# Patient Record
Sex: Male | Born: 1960
Health system: Southern US, Community
[De-identification: ages and names within clinical notes are randomized; demographics above are authoritative.]

## PROBLEM LIST (undated history)

## (undated) DIAGNOSIS — I4901 Ventricular fibrillation: Secondary | ICD-10-CM

## (undated) DIAGNOSIS — M1990 Unspecified osteoarthritis, unspecified site: Secondary | ICD-10-CM

## (undated) DIAGNOSIS — I5022 Chronic systolic (congestive) heart failure: Secondary | ICD-10-CM

## (undated) DIAGNOSIS — N189 Chronic kidney disease, unspecified: Secondary | ICD-10-CM

## (undated) DIAGNOSIS — I10 Essential (primary) hypertension: Secondary | ICD-10-CM

## (undated) DIAGNOSIS — Z95810 Presence of automatic (implantable) cardiac defibrillator: Secondary | ICD-10-CM

## (undated) DIAGNOSIS — I428 Other cardiomyopathies: Secondary | ICD-10-CM

## (undated) HISTORY — DX: Presence of automatic (implantable) cardiac defibrillator: Z95.810

## (undated) HISTORY — DX: Other cardiomyopathies: I42.8

## (undated) HISTORY — PX: APPENDECTOMY: SHX54

## (undated) HISTORY — DX: Ventricular fibrillation: I49.01

## (undated) HISTORY — DX: Chronic systolic (congestive) heart failure: I50.22

---

## 2014-11-10 ENCOUNTER — Emergency Department (HOSPITAL_COMMUNITY): Payer: BLUE CROSS/BLUE SHIELD

## 2014-11-10 ENCOUNTER — Encounter (HOSPITAL_COMMUNITY): Payer: Self-pay | Admitting: Radiology

## 2014-11-10 ENCOUNTER — Inpatient Hospital Stay (HOSPITAL_COMMUNITY): Payer: BLUE CROSS/BLUE SHIELD

## 2014-11-10 ENCOUNTER — Inpatient Hospital Stay (HOSPITAL_COMMUNITY)
Admission: EM | Admit: 2014-11-10 | Discharge: 2014-11-17 | DRG: 224 | Disposition: A | Discharge status: Home Health | Payer: BLUE CROSS/BLUE SHIELD | Attending: Internal Medicine | Admitting: Internal Medicine

## 2014-11-10 ENCOUNTER — Encounter (HOSPITAL_COMMUNITY)
Admission: EM | Disposition: A | Discharge status: Home Health | Payer: BLUE CROSS/BLUE SHIELD | Source: Home / Self Care | Attending: Emergency Medicine

## 2014-11-10 ENCOUNTER — Other Ambulatory Visit (HOSPITAL_BASED_OUTPATIENT_CLINIC_OR_DEPARTMENT_OTHER): Payer: BLUE CROSS/BLUE SHIELD

## 2014-11-10 DIAGNOSIS — G931 Anoxic brain damage, not elsewhere classified: Secondary | ICD-10-CM | POA: Diagnosis present

## 2014-11-10 DIAGNOSIS — R0902 Hypoxemia: Secondary | ICD-10-CM | POA: Insufficient documentation

## 2014-11-10 DIAGNOSIS — R739 Hyperglycemia, unspecified: Secondary | ICD-10-CM | POA: Diagnosis present

## 2014-11-10 DIAGNOSIS — E785 Hyperlipidemia, unspecified: Secondary | ICD-10-CM | POA: Diagnosis present

## 2014-11-10 DIAGNOSIS — I5023 Acute on chronic systolic (congestive) heart failure: Secondary | ICD-10-CM | POA: Diagnosis present

## 2014-11-10 DIAGNOSIS — I9589 Other hypotension: Secondary | ICD-10-CM

## 2014-11-10 DIAGNOSIS — I6789 Other cerebrovascular disease: Secondary | ICD-10-CM

## 2014-11-10 DIAGNOSIS — I429 Cardiomyopathy, unspecified: Secondary | ICD-10-CM | POA: Diagnosis present

## 2014-11-10 DIAGNOSIS — E669 Obesity, unspecified: Secondary | ICD-10-CM | POA: Diagnosis present

## 2014-11-10 DIAGNOSIS — R740 Nonspecific elevation of levels of transaminase and lactic acid dehydrogenase [LDH]: Secondary | ICD-10-CM | POA: Diagnosis not present

## 2014-11-10 DIAGNOSIS — D649 Anemia, unspecified: Secondary | ICD-10-CM | POA: Diagnosis present

## 2014-11-10 DIAGNOSIS — M1990 Unspecified osteoarthritis, unspecified site: Secondary | ICD-10-CM | POA: Diagnosis present

## 2014-11-10 DIAGNOSIS — J690 Pneumonitis due to inhalation of food and vomit: Secondary | ICD-10-CM | POA: Diagnosis not present

## 2014-11-10 DIAGNOSIS — T502X5A Adverse effect of carbonic-anhydrase inhibitors, benzothiadiazides and other diuretics, initial encounter: Secondary | ICD-10-CM | POA: Diagnosis present

## 2014-11-10 DIAGNOSIS — J9811 Atelectasis: Secondary | ICD-10-CM | POA: Diagnosis present

## 2014-11-10 DIAGNOSIS — I10 Essential (primary) hypertension: Secondary | ICD-10-CM | POA: Diagnosis present

## 2014-11-10 DIAGNOSIS — I472 Ventricular tachycardia, unspecified: Secondary | ICD-10-CM

## 2014-11-10 DIAGNOSIS — T82594A Other mechanical complication of infusion catheter, initial encounter: Secondary | ICD-10-CM

## 2014-11-10 DIAGNOSIS — I428 Other cardiomyopathies: Secondary | ICD-10-CM

## 2014-11-10 DIAGNOSIS — N179 Acute kidney failure, unspecified: Secondary | ICD-10-CM | POA: Diagnosis present

## 2014-11-10 DIAGNOSIS — Z9289 Personal history of other medical treatment: Secondary | ICD-10-CM

## 2014-11-10 DIAGNOSIS — T82898A Other specified complication of vascular prosthetic devices, implants and grafts, initial encounter: Secondary | ICD-10-CM | POA: Diagnosis present

## 2014-11-10 DIAGNOSIS — Z959 Presence of cardiac and vascular implant and graft, unspecified: Secondary | ICD-10-CM

## 2014-11-10 DIAGNOSIS — Z6828 Body mass index (BMI) 28.0-28.9, adult: Secondary | ICD-10-CM | POA: Diagnosis not present

## 2014-11-10 DIAGNOSIS — E876 Hypokalemia: Secondary | ICD-10-CM | POA: Diagnosis present

## 2014-11-10 DIAGNOSIS — I129 Hypertensive chronic kidney disease with stage 1 through stage 4 chronic kidney disease, or unspecified chronic kidney disease: Secondary | ICD-10-CM | POA: Diagnosis present

## 2014-11-10 DIAGNOSIS — J9600 Acute respiratory failure, unspecified whether with hypoxia or hypercapnia: Secondary | ICD-10-CM | POA: Diagnosis not present

## 2014-11-10 DIAGNOSIS — I469 Cardiac arrest, cause unspecified: Secondary | ICD-10-CM | POA: Diagnosis present

## 2014-11-10 DIAGNOSIS — I447 Left bundle-branch block, unspecified: Secondary | ICD-10-CM | POA: Diagnosis present

## 2014-11-10 DIAGNOSIS — J9601 Acute respiratory failure with hypoxia: Secondary | ICD-10-CM | POA: Diagnosis not present

## 2014-11-10 DIAGNOSIS — I4901 Ventricular fibrillation: Secondary | ICD-10-CM | POA: Diagnosis not present

## 2014-11-10 DIAGNOSIS — E162 Hypoglycemia, unspecified: Secondary | ICD-10-CM | POA: Diagnosis not present

## 2014-11-10 DIAGNOSIS — Y848 Other medical procedures as the cause of abnormal reaction of the patient, or of later complication, without mention of misadventure at the time of the procedure: Secondary | ICD-10-CM | POA: Diagnosis present

## 2014-11-10 DIAGNOSIS — R570 Cardiogenic shock: Secondary | ICD-10-CM | POA: Diagnosis present

## 2014-11-10 DIAGNOSIS — J189 Pneumonia, unspecified organism: Secondary | ICD-10-CM

## 2014-11-10 HISTORY — PX: CARDIAC CATHETERIZATION: SHX172

## 2014-11-10 HISTORY — DX: Essential (primary) hypertension: I10

## 2014-11-10 HISTORY — DX: Chronic kidney disease, unspecified: N18.9

## 2014-11-10 HISTORY — DX: Unspecified osteoarthritis, unspecified site: M19.90

## 2014-11-10 LAB — COMPREHENSIVE METABOLIC PANEL
ALT: 314 U/L — ABNORMAL HIGH (ref 17–63)
AST: 242 U/L — ABNORMAL HIGH (ref 15–41)
Albumin: 3.1 g/dL — ABNORMAL LOW (ref 3.5–5.0)
Alkaline Phosphatase: 50 U/L (ref 38–126)
Anion gap: 17 — ABNORMAL HIGH (ref 5–15)
BUN: 17 mg/dL (ref 6–20)
CO2: 16 mmol/L — ABNORMAL LOW (ref 22–32)
Calcium: 7.7 mg/dL — ABNORMAL LOW (ref 8.9–10.3)
Chloride: 106 mmol/L (ref 101–111)
Creatinine, Ser: 2.13 mg/dL — ABNORMAL HIGH (ref 0.61–1.24)
GFR calc Af Amer: 39 mL/min — ABNORMAL LOW (ref >60)
GFR calc non Af Amer: 33 mL/min — ABNORMAL LOW (ref >60)
Glucose, Bld: 322 mg/dL — ABNORMAL HIGH (ref 65–99)
Potassium: 2.6 mmol/L — CL (ref 3.5–5.1)
Sodium: 139 mmol/L (ref 135–145)
Total Bilirubin: 1 mg/dL (ref 0.3–1.2)
Total Protein: 5.5 g/dL — ABNORMAL LOW (ref 6.5–8.1)

## 2014-11-10 LAB — POCT I-STAT 3, ART BLOOD GAS (G3+)
Acid-base deficit: 11 mmol/L — ABNORMAL HIGH (ref 0.0–2.0)
Acid-base deficit: 11 mmol/L — ABNORMAL HIGH (ref 0.0–2.0)
Acid-base deficit: 10 mmol/L — ABNORMAL HIGH (ref 0.0–2.0)
Acid-base deficit: 8 mmol/L — ABNORMAL HIGH (ref 0.0–2.0)
Bicarbonate: 19 mEq/L — ABNORMAL LOW (ref 20.0–24.0)
Bicarbonate: 16 mEq/L — ABNORMAL LOW (ref 20.0–24.0)
Bicarbonate: 14.4 mEq/L — ABNORMAL LOW (ref 20.0–24.0)
Bicarbonate: 14.3 mEq/L — ABNORMAL LOW (ref 20.0–24.0)
O2 Saturation: 83 %
O2 Saturation: 79 %
O2 Saturation: 95 %
O2 Saturation: 99 %
Patient temperature: 34.2
Patient temperature: 33
Patient temperature: 33
Patient temperature: 33
TCO2: 21 mmol/L (ref 0–100)
TCO2: 17 mmol/L (ref 0–100)
TCO2: 15 mmol/L (ref 0–100)
TCO2: 15 mmol/L (ref 0–100)
pCO2 arterial: 51.1 mmHg — ABNORMAL HIGH (ref 35.0–45.0)
pCO2 arterial: 32.2 mmHg — ABNORMAL LOW (ref 35.0–45.0)
pCO2 arterial: 22.6 mmHg — ABNORMAL LOW (ref 35.0–45.0)
pCO2 arterial: 19.7 mmHg — CL (ref 35.0–45.0)
pH, Arterial: 7.161 — CL (ref 7.350–7.450)
pH, Arterial: 7.283 — ABNORMAL LOW (ref 7.350–7.450)
pH, Arterial: 7.395 (ref 7.350–7.450)
pH, Arterial: 7.452 — ABNORMAL HIGH (ref 7.350–7.450)
pO2, Arterial: 52 mmHg — ABNORMAL LOW (ref 80.0–100.0)
pO2, Arterial: 39 mmHg — CL (ref 80.0–100.0)
pO2, Arterial: 63 mmHg — ABNORMAL LOW (ref 80.0–100.0)
pO2, Arterial: 116 mmHg — ABNORMAL HIGH (ref 80.0–100.0)

## 2014-11-10 LAB — GLUCOSE, CAPILLARY
Glucose-Capillary: 128 mg/dL — ABNORMAL HIGH (ref 65–99)
Glucose-Capillary: 118 mg/dL — ABNORMAL HIGH (ref 65–99)
Glucose-Capillary: 138 mg/dL — ABNORMAL HIGH (ref 65–99)
Glucose-Capillary: 139 mg/dL — ABNORMAL HIGH (ref 65–99)
Glucose-Capillary: 134 mg/dL — ABNORMAL HIGH (ref 65–99)
Glucose-Capillary: 104 mg/dL — ABNORMAL HIGH (ref 65–99)
Glucose-Capillary: 142 mg/dL — ABNORMAL HIGH (ref 65–99)
Glucose-Capillary: 101 mg/dL — ABNORMAL HIGH (ref 65–99)
Glucose-Capillary: 108 mg/dL — ABNORMAL HIGH (ref 65–99)
Glucose-Capillary: 104 mg/dL — ABNORMAL HIGH (ref 65–99)

## 2014-11-10 LAB — BASIC METABOLIC PANEL
Anion gap: 4 — ABNORMAL LOW (ref 5–15)
Anion gap: 7 (ref 5–15)
Anion gap: 7 (ref 5–15)
Anion gap: 8 (ref 5–15)
BUN: 16 mg/dL (ref 6–20)
BUN: 11 mg/dL (ref 6–20)
BUN: 10 mg/dL (ref 6–20)
BUN: 8 mg/dL (ref 6–20)
CO2: 21 mmol/L — ABNORMAL LOW (ref 22–32)
CO2: 15 mmol/L — ABNORMAL LOW (ref 22–32)
CO2: 13 mmol/L — ABNORMAL LOW (ref 22–32)
CO2: 12 mmol/L — ABNORMAL LOW (ref 22–32)
Calcium: 6.6 mg/dL — ABNORMAL LOW (ref 8.9–10.3)
Calcium: 6.5 mg/dL — ABNORMAL LOW (ref 8.9–10.3)
Calcium: 5.8 mg/dL — CL (ref 8.9–10.3)
Calcium: 5.5 mg/dL — CL (ref 8.9–10.3)
Chloride: 114 mmol/L — ABNORMAL HIGH (ref 101–111)
Chloride: 117 mmol/L — ABNORMAL HIGH (ref 101–111)
Chloride: 121 mmol/L — ABNORMAL HIGH (ref 101–111)
Chloride: 118 mmol/L — ABNORMAL HIGH (ref 101–111)
Creatinine, Ser: 1.51 mg/dL — ABNORMAL HIGH (ref 0.61–1.24)
Creatinine, Ser: 0.95 mg/dL (ref 0.61–1.24)
Creatinine, Ser: 0.77 mg/dL (ref 0.61–1.24)
Creatinine, Ser: 0.69 mg/dL (ref 0.61–1.24)
GFR calc Af Amer: 59 mL/min — ABNORMAL LOW (ref >60)
GFR calc Af Amer: >60 mL/min (ref >60)
GFR calc Af Amer: >60 mL/min (ref >60)
GFR calc Af Amer: >60 mL/min (ref >60)
GFR calc non Af Amer: 51 mL/min — ABNORMAL LOW (ref >60)
GFR calc non Af Amer: >60 mL/min (ref >60)
GFR calc non Af Amer: >60 mL/min (ref >60)
GFR calc non Af Amer: >60 mL/min (ref >60)
Glucose, Bld: 149 mg/dL — ABNORMAL HIGH (ref 65–99)
Glucose, Bld: 122 mg/dL — ABNORMAL HIGH (ref 65–99)
Glucose, Bld: 111 mg/dL — ABNORMAL HIGH (ref 65–99)
Glucose, Bld: 102 mg/dL — ABNORMAL HIGH (ref 65–99)
Potassium: 3.8 mmol/L (ref 3.5–5.1)
Potassium: 2.7 mmol/L — CL (ref 3.5–5.1)
Potassium: 2.2 mmol/L — CL (ref 3.5–5.1)
Potassium: 2.2 mmol/L — CL (ref 3.5–5.1)
Sodium: 139 mmol/L (ref 135–145)
Sodium: 139 mmol/L (ref 135–145)
Sodium: 141 mmol/L (ref 135–145)
Sodium: 138 mmol/L (ref 135–145)

## 2014-11-10 LAB — URINALYSIS, ROUTINE W REFLEX MICROSCOPIC
Bilirubin Urine: NEGATIVE
Glucose, UA: NEGATIVE mg/dL
Ketones, ur: NEGATIVE mg/dL
Leukocytes, UA: NEGATIVE
Nitrite: NEGATIVE
Protein, ur: NEGATIVE mg/dL
Specific Gravity, Urine: 1.01 (ref 1.005–1.030)
Urobilinogen, UA: 0.2 mg/dL (ref 0.0–1.0)
pH: 5 (ref 5.0–8.0)

## 2014-11-10 LAB — POCT I-STAT, CHEM 8
BUN: 17 mg/dL (ref 6–20)
BUN: 17 mg/dL (ref 6–20)
BUN: 16 mg/dL (ref 6–20)
BUN: 10 mg/dL (ref 6–20)
Calcium, Ion: 0.99 mmol/L — ABNORMAL LOW (ref 1.12–1.23)
Calcium, Ion: 1.05 mmol/L — ABNORMAL LOW (ref 1.12–1.23)
Calcium, Ion: 1.08 mmol/L — ABNORMAL LOW (ref 1.12–1.23)
Calcium, Ion: 0.86 mmol/L — ABNORMAL LOW (ref 1.12–1.23)
Chloride: 117 mmol/L — ABNORMAL HIGH (ref 101–111)
Chloride: 109 mmol/L (ref 101–111)
Chloride: 109 mmol/L (ref 101–111)
Chloride: 114 mmol/L — ABNORMAL HIGH (ref 101–111)
Creatinine, Ser: 1.4 mg/dL — ABNORMAL HIGH (ref 0.61–1.24)
Creatinine, Ser: 1.1 mg/dL (ref 0.61–1.24)
Creatinine, Ser: 1 mg/dL (ref 0.61–1.24)
Creatinine, Ser: 0.6 mg/dL — ABNORMAL LOW (ref 0.61–1.24)
Glucose, Bld: 145 mg/dL — ABNORMAL HIGH (ref 65–99)
Glucose, Bld: 140 mg/dL — ABNORMAL HIGH (ref 65–99)
Glucose, Bld: 138 mg/dL — ABNORMAL HIGH (ref 65–99)
Glucose, Bld: 110 mg/dL — ABNORMAL HIGH (ref 65–99)
HCT: 38 % — ABNORMAL LOW (ref 39.0–52.0)
HCT: 45 % (ref 39.0–52.0)
HCT: 46 % (ref 39.0–52.0)
HCT: 38 % — ABNORMAL LOW (ref 39.0–52.0)
Hemoglobin: 12.9 g/dL — ABNORMAL LOW (ref 13.0–17.0)
Hemoglobin: 15.3 g/dL (ref 13.0–17.0)
Hemoglobin: 15.6 g/dL (ref 13.0–17.0)
Hemoglobin: 12.9 g/dL — ABNORMAL LOW (ref 13.0–17.0)
Potassium: 3.6 mmol/L (ref 3.5–5.1)
Potassium: 3.6 mmol/L (ref 3.5–5.1)
Potassium: 3.2 mmol/L — ABNORMAL LOW (ref 3.5–5.1)
Potassium: 2.2 mmol/L — CL (ref 3.5–5.1)
Sodium: 138 mmol/L (ref 135–145)
Sodium: 142 mmol/L (ref 135–145)
Sodium: 142 mmol/L (ref 135–145)
Sodium: 146 mmol/L — ABNORMAL HIGH (ref 135–145)
TCO2: 21 mmol/L (ref 0–100)
TCO2: 16 mmol/L (ref 0–100)
TCO2: 15 mmol/L (ref 0–100)
TCO2: 12 mmol/L (ref 0–100)

## 2014-11-10 LAB — URINE MICROSCOPIC-ADD ON

## 2014-11-10 LAB — I-STAT CHEM 8, ED
BUN: 21 mg/dL — ABNORMAL HIGH (ref 6–20)
Calcium, Ion: 0.96 mmol/L — ABNORMAL LOW (ref 1.12–1.23)
Chloride: 103 mmol/L (ref 101–111)
Creatinine, Ser: 2 mg/dL — ABNORMAL HIGH (ref 0.61–1.24)
Glucose, Bld: 313 mg/dL — ABNORMAL HIGH (ref 65–99)
HCT: 39 % (ref 39.0–52.0)
Hemoglobin: 13.3 g/dL (ref 13.0–17.0)
Potassium: 2.5 mmol/L — CL (ref 3.5–5.1)
Sodium: 140 mmol/L (ref 135–145)
TCO2: 17 mmol/L (ref 0–100)

## 2014-11-10 LAB — CBC WITH DIFFERENTIAL/PLATELET
Basophils Absolute: 0.1 10*3/uL (ref 0.0–0.1)
Basophils Relative: 1 % (ref 0–1)
Eosinophils Absolute: 0.2 10*3/uL (ref 0.0–0.7)
Eosinophils Relative: 2 % (ref 0–5)
HCT: 37.5 % — ABNORMAL LOW (ref 39.0–52.0)
Hemoglobin: 11.9 g/dL — ABNORMAL LOW (ref 13.0–17.0)
Lymphocytes Relative: 41 % (ref 12–46)
Lymphs Abs: 4.4 10*3/uL — ABNORMAL HIGH (ref 0.7–4.0)
MCH: 29 pg (ref 26.0–34.0)
MCHC: 31.7 g/dL (ref 30.0–36.0)
MCV: 91.2 fL (ref 78.0–100.0)
Monocytes Absolute: 0.4 10*3/uL (ref 0.1–1.0)
Monocytes Relative: 4 % (ref 3–12)
Neutro Abs: 5.7 10*3/uL (ref 1.7–7.7)
Neutrophils Relative %: 52 % (ref 43–77)
Platelets: 218 10*3/uL (ref 150–400)
RBC: 4.11 MIL/uL — ABNORMAL LOW (ref 4.22–5.81)
RDW: 13.2 % (ref 11.5–15.5)
WBC: 10.7 10*3/uL — ABNORMAL HIGH (ref 4.0–10.5)

## 2014-11-10 LAB — BASIC METABOLIC PANEL WITH GFR
Anion gap: 9 (ref 5–15)
BUN: 8 mg/dL (ref 6–20)
CO2: 17 mmol/L — ABNORMAL LOW (ref 22–32)
Calcium: 7.6 mg/dL — ABNORMAL LOW (ref 8.9–10.3)
Chloride: 109 mmol/L (ref 101–111)
Creatinine, Ser: 0.83 mg/dL (ref 0.61–1.24)
GFR calc Af Amer: >60 mL/min
GFR calc non Af Amer: >60 mL/min
Glucose, Bld: 118 mg/dL — ABNORMAL HIGH (ref 65–99)
Potassium: 4.9 mmol/L (ref 3.5–5.1)
Sodium: 135 mmol/L (ref 135–145)

## 2014-11-10 LAB — I-STAT TROPONIN, ED: Troponin i, poc: 0 ng/mL (ref 0.00–0.08)

## 2014-11-10 LAB — CG4 I-STAT (LACTIC ACID): Lactic Acid, Venous: 1.75 mmol/L (ref 0.5–2.0)

## 2014-11-10 LAB — CBC
HCT: 38.1 % — ABNORMAL LOW (ref 39.0–52.0)
HCT: 38.4 % — ABNORMAL LOW (ref 39.0–52.0)
Hemoglobin: 12.3 g/dL — ABNORMAL LOW (ref 13.0–17.0)
Hemoglobin: 13.1 g/dL (ref 13.0–17.0)
MCH: 29 pg (ref 26.0–34.0)
MCH: 29.4 pg (ref 26.0–34.0)
MCHC: 32.3 g/dL (ref 30.0–36.0)
MCHC: 34.1 g/dL (ref 30.0–36.0)
MCV: 89.9 fL (ref 78.0–100.0)
MCV: 86.3 fL (ref 78.0–100.0)
Platelets: 237 10*3/uL (ref 150–400)
Platelets: 192 10*3/uL (ref 150–400)
RBC: 4.24 MIL/uL (ref 4.22–5.81)
RBC: 4.45 MIL/uL (ref 4.22–5.81)
RDW: 13.2 % (ref 11.5–15.5)
RDW: 13 % (ref 11.5–15.5)
WBC: 9.7 10*3/uL (ref 4.0–10.5)
WBC: 6.3 10*3/uL (ref 4.0–10.5)

## 2014-11-10 LAB — PROTIME-INR
INR: 1.27 (ref 0.00–1.49)
INR: 1.4 (ref 0.00–1.49)
INR: 1.3 (ref 0.00–1.49)
Prothrombin Time: 16 seconds — ABNORMAL HIGH (ref 11.6–15.2)
Prothrombin Time: 17.3 seconds — ABNORMAL HIGH (ref 11.6–15.2)
Prothrombin Time: 16.3 seconds — ABNORMAL HIGH (ref 11.6–15.2)

## 2014-11-10 LAB — PHOSPHORUS: Phosphorus: 3.8 mg/dL (ref 2.5–4.6)

## 2014-11-10 LAB — APTT
aPTT: 30 seconds (ref 24–37)
aPTT: 30 seconds (ref 24–37)
aPTT: 31 seconds (ref 24–37)

## 2014-11-10 LAB — BRAIN NATRIURETIC PEPTIDE: B Natriuretic Peptide: 136.7 pg/mL — ABNORMAL HIGH (ref 0.0–100.0)

## 2014-11-10 LAB — TROPONIN I
Troponin I: 0.13 ng/mL — ABNORMAL HIGH (ref <0.031)
Troponin I: 0.56 ng/mL (ref <0.031)

## 2014-11-10 LAB — LACTIC ACID, PLASMA: Lactic Acid, Venous: 2 mmol/L (ref 0.5–2.0)

## 2014-11-10 LAB — MAGNESIUM: Magnesium: 1.6 mg/dL — ABNORMAL LOW (ref 1.7–2.4)

## 2014-11-10 LAB — MRSA PCR SCREENING: MRSA by PCR: NEGATIVE

## 2014-11-10 SURGERY — LEFT HEART CATH AND CORONARY ANGIOGRAPHY
Anesthesia: LOCAL

## 2014-11-10 MED ORDER — VANCOMYCIN HCL IN DEXTROSE 1-5 GM/200ML-% IV SOLN
1000.0000 mg | Freq: Once | INTRAVENOUS | Status: AC
  Administered 2014-11-10: 1000 mg via INTRAVENOUS
  Filled 2014-11-10: qty 200

## 2014-11-10 MED ORDER — MIDAZOLAM BOLUS VIA INFUSION
2.0000 mg | INTRAVENOUS | Status: DC | PRN
  Administered 2014-11-12: 2 mg via INTRAVENOUS
  Filled 2014-11-10 (×2): qty 2

## 2014-11-10 MED ORDER — SODIUM CHLORIDE 0.9 % WEIGHT BASED INFUSION: 1.0000 mL/kg/h | INTRAVENOUS | Status: AC

## 2014-11-10 MED ORDER — SODIUM CHLORIDE 0.9 % IJ SOLN
3.0000 mL | Freq: Two times a day (BID) | INTRAMUSCULAR | Status: DC
  Administered 2014-11-10 – 2014-11-14 (×7): 3 mL via INTRAVENOUS

## 2014-11-10 MED ORDER — SODIUM CHLORIDE 0.9 % IV SOLN
25.0000 ug/h | INTRAVENOUS | Status: DC
  Filled 2014-11-10: qty 50

## 2014-11-10 MED ORDER — POTASSIUM CHLORIDE 10 MEQ/100ML IV SOLN
10.0000 meq | INTRAVENOUS | Status: AC
  Administered 2014-11-10 (×2): 10 meq via INTRAVENOUS
  Filled 2014-11-10: qty 100

## 2014-11-10 MED ORDER — MIDAZOLAM HCL 5 MG/ML IJ SOLN
0.0000 mg/h | INTRAMUSCULAR | Status: DC
  Administered 2014-11-10: 10 mg/h via INTRAVENOUS
  Filled 2014-11-10: qty 10

## 2014-11-10 MED ORDER — SODIUM CHLORIDE 0.9 % IJ SOLN
3.0000 mL | INTRAMUSCULAR | Status: DC | PRN
  Administered 2014-11-13 – 2014-11-14 (×2): 3 mL via INTRAVENOUS
  Filled 2014-11-10 (×2): qty 3

## 2014-11-10 MED ORDER — CISATRACURIUM BOLUS VIA INFUSION
0.0500 mg/kg | INTRAVENOUS | Status: DC | PRN
  Filled 2014-11-10: qty 5

## 2014-11-10 MED ORDER — SODIUM CHLORIDE 0.9 % IV SOLN
3.0000 g | Freq: Three times a day (TID) | INTRAVENOUS | Status: DC
  Administered 2014-11-10 – 2014-11-13 (×10): 3 g via INTRAVENOUS
  Filled 2014-11-10 (×12): qty 3

## 2014-11-10 MED ORDER — POTASSIUM CHLORIDE 10 MEQ/100ML IV SOLN
10.0000 meq | INTRAVENOUS | Status: AC
  Administered 2014-11-10 (×2): 10 meq via INTRAVENOUS
  Filled 2014-11-10 (×2): qty 100

## 2014-11-10 MED ORDER — MIDAZOLAM HCL 2 MG/2ML IJ SOLN
INTRAMUSCULAR | Status: AC
  Filled 2014-11-10: qty 4

## 2014-11-10 MED ORDER — SODIUM CHLORIDE 0.9 % IJ SOLN: 10.0000 mL | INTRAMUSCULAR | Status: DC | PRN

## 2014-11-10 MED ORDER — PIPERACILLIN-TAZOBACTAM 3.375 G IVPB 30 MIN
3.3750 g | Freq: Once | INTRAVENOUS | Status: AC
  Administered 2014-11-10: 3.375 g via INTRAVENOUS
  Filled 2014-11-10: qty 50

## 2014-11-10 MED ORDER — MIDAZOLAM HCL 2 MG/2ML IJ SOLN
2.0000 mg | Freq: Once | INTRAMUSCULAR | Status: AC
  Administered 2014-11-12: 2 mg via INTRAVENOUS
  Filled 2014-11-10: qty 2

## 2014-11-10 MED ORDER — HEPARIN (PORCINE) IN NACL 2-0.9 UNIT/ML-% IJ SOLN
INTRAMUSCULAR | Status: AC
  Filled 2014-11-10: qty 1000

## 2014-11-10 MED ORDER — SODIUM CHLORIDE 0.9 % IV SOLN
2000.0000 mL | Freq: Once | INTRAVENOUS | Status: AC
  Administered 2014-11-10: 2000 mL via INTRAVENOUS

## 2014-11-10 MED ORDER — MAGNESIUM SULFATE 2 GM/50ML IV SOLN
2.0000 g | Freq: Once | INTRAVENOUS | Status: AC
  Administered 2014-11-10: 2 g via INTRAVENOUS
  Filled 2014-11-10: qty 50

## 2014-11-10 MED ORDER — FENTANYL BOLUS VIA INFUSION
50.0000 ug | INTRAVENOUS | Status: DC | PRN
  Filled 2014-11-10: qty 50

## 2014-11-10 MED ORDER — MIDAZOLAM HCL 5 MG/5ML IJ SOLN
INTRAMUSCULAR | Status: AC | PRN
  Administered 2014-11-10 (×2): 3 mg via INTRAVENOUS
  Administered 2014-11-10: 5 mg via INTRAVENOUS

## 2014-11-10 MED ORDER — MIDAZOLAM HCL 5 MG/ML IJ SOLN
1.0000 mg/h | INTRAMUSCULAR | Status: DC
  Administered 2014-11-10 (×3): 10 mg/h via INTRAVENOUS
  Administered 2014-11-11: 4 mg/h via INTRAVENOUS
  Administered 2014-11-11: 10 mg/h via INTRAVENOUS
  Filled 2014-11-10 (×6): qty 10

## 2014-11-10 MED ORDER — ETOMIDATE 2 MG/ML IV SOLN
INTRAVENOUS | Status: AC | PRN
  Administered 2014-11-10: 20 mg via INTRAVENOUS

## 2014-11-10 MED ORDER — NOREPINEPHRINE BITARTRATE 1 MG/ML IV SOLN
0.0000 ug/min | INTRAVENOUS | Status: DC
  Administered 2014-11-10: 20 ug/min via INTRAVENOUS
  Administered 2014-11-10: 10 ug/min via INTRAVENOUS
  Administered 2014-11-11: 2 ug/min via INTRAVENOUS
  Filled 2014-11-10 (×3): qty 16

## 2014-11-10 MED ORDER — POTASSIUM CHLORIDE 10 MEQ/50ML IV SOLN
10.0000 meq | INTRAVENOUS | Status: AC
  Administered 2014-11-10 (×4): 10 meq via INTRAVENOUS
  Filled 2014-11-10 (×4): qty 50

## 2014-11-10 MED ORDER — SODIUM CHLORIDE 0.9 % IV SOLN
INTRAVENOUS | Status: DC
  Filled 2014-11-10: qty 2.5

## 2014-11-10 MED ORDER — LIDOCAINE HCL (PF) 1 % IJ SOLN
INTRAMUSCULAR | Status: AC
  Filled 2014-11-10: qty 30

## 2014-11-10 MED ORDER — CHLORHEXIDINE GLUCONATE 0.12% ORAL RINSE (MEDLINE KIT)
15.0000 mL | Freq: Two times a day (BID) | OROMUCOSAL | Status: DC
  Administered 2014-11-10 – 2014-11-13 (×8): 15 mL via OROMUCOSAL

## 2014-11-10 MED ORDER — FENTANYL BOLUS VIA INFUSION
50.0000 ug | INTRAVENOUS | Status: DC | PRN
  Administered 2014-11-12 – 2014-11-13 (×4): 50 ug via INTRAVENOUS
  Filled 2014-11-10: qty 50

## 2014-11-10 MED ORDER — ACETAMINOPHEN 325 MG PO TABS
650.0000 mg | ORAL_TABLET | ORAL | Status: DC | PRN
  Administered 2014-11-14 – 2014-11-15 (×2): 650 mg via ORAL
  Filled 2014-11-10 (×2): qty 2

## 2014-11-10 MED ORDER — ASPIRIN 81 MG PO CHEW
81.0000 mg | CHEWABLE_TABLET | Freq: Every day | ORAL | Status: DC
  Administered 2014-11-10 – 2014-11-17 (×7): 81 mg via ORAL
  Filled 2014-11-10 (×8): qty 1

## 2014-11-10 MED ORDER — HEPARIN SODIUM (PORCINE) 5000 UNIT/ML IJ SOLN
5000.0000 [IU] | Freq: Three times a day (TID) | INTRAMUSCULAR | Status: DC
  Administered 2014-11-10 – 2014-11-15 (×15): 5000 [IU] via SUBCUTANEOUS
  Filled 2014-11-10 (×14): qty 1

## 2014-11-10 MED ORDER — IOHEXOL 300 MG/ML  SOLN
INTRAMUSCULAR | Status: DC | PRN
  Administered 2014-11-10: 70 mL via INTRAVENOUS

## 2014-11-10 MED ORDER — PROPOFOL 1000 MG/100ML IV EMUL
INTRAVENOUS | Status: AC
  Administered 2014-11-10: 10 ug/kg/min
  Filled 2014-11-10: qty 100

## 2014-11-10 MED ORDER — NITROGLYCERIN 1 MG/10 ML FOR IR/CATH LAB
INTRA_ARTERIAL | Status: DC | PRN
  Administered 2014-11-10: 03:00:00

## 2014-11-10 MED ORDER — SODIUM CHLORIDE 0.9 % IV SOLN: 250.0000 mL | INTRAVENOUS | Status: DC | PRN

## 2014-11-10 MED ORDER — ARTIFICIAL TEARS OP OINT
1.0000 "application " | TOPICAL_OINTMENT | Freq: Three times a day (TID) | OPHTHALMIC | Status: DC
  Administered 2014-11-10 – 2014-11-11 (×5): 1 via OPHTHALMIC
  Filled 2014-11-10: qty 3.5

## 2014-11-10 MED ORDER — MIDAZOLAM HCL 2 MG/2ML IJ SOLN
INTRAMUSCULAR | Status: AC
Start: 2014-11-10 — End: 2014-11-10
  Administered 2014-11-10: 5 mg
  Filled 2014-11-10: qty 6

## 2014-11-10 MED ORDER — NOREPINEPHRINE BITARTRATE 1 MG/ML IV SOLN: 0.0000 ug/min | Freq: Once | INTRAVENOUS | Status: DC

## 2014-11-10 MED ORDER — ANTISEPTIC ORAL RINSE SOLUTION (CORINZ)
7.0000 mL | OROMUCOSAL | Status: DC
  Administered 2014-11-10 – 2014-11-14 (×40): 7 mL via OROMUCOSAL

## 2014-11-10 MED ORDER — SODIUM CHLORIDE 0.9 % IV SOLN
25.0000 ug/h | INTRAVENOUS | Status: DC
  Administered 2014-11-10: 100 ug/h via INTRAVENOUS
  Administered 2014-11-11: 175 ug/h via INTRAVENOUS
  Administered 2014-11-12: 25 ug/h via INTRAVENOUS
  Filled 2014-11-10 (×3): qty 50

## 2014-11-10 MED ORDER — NOREPINEPHRINE BITARTRATE 1 MG/ML IV SOLN
0.0000 ug/min | Freq: Once | INTRAVENOUS | Status: AC
  Administered 2014-11-10: 2.5 ug/min via INTRAVENOUS

## 2014-11-10 MED ORDER — SODIUM CHLORIDE 0.9 % IV SOLN
1.0000 ug/kg/min | INTRAVENOUS | Status: DC
  Administered 2014-11-10: 1 ug/kg/min via INTRAVENOUS
  Filled 2014-11-10 (×2): qty 20

## 2014-11-10 MED ORDER — EPINEPHRINE HCL 0.1 MG/ML IJ SOSY
PREFILLED_SYRINGE | INTRAMUSCULAR | Status: AC | PRN
  Administered 2014-11-10 (×2): 1 mg via INTRAVENOUS

## 2014-11-10 MED ORDER — MIDAZOLAM HCL 2 MG/2ML IJ SOLN
INTRAMUSCULAR | Status: AC
  Filled 2014-11-10: qty 6

## 2014-11-10 MED ORDER — SODIUM CHLORIDE 0.9 % IJ SOLN
10.0000 mL | Freq: Two times a day (BID) | INTRAMUSCULAR | Status: DC
  Administered 2014-11-10: 10 mL
  Administered 2014-11-10: 3 mL
  Administered 2014-11-11 – 2014-11-14 (×6): 10 mL

## 2014-11-10 MED ORDER — MIDAZOLAM BOLUS VIA INFUSION
1.0000 mg | INTRAVENOUS | Status: DC | PRN
  Filled 2014-11-10: qty 2

## 2014-11-10 MED ORDER — ONDANSETRON HCL 4 MG/2ML IJ SOLN: 4.0000 mg | Freq: Four times a day (QID) | INTRAMUSCULAR | Status: DC | PRN

## 2014-11-10 MED ORDER — MIDAZOLAM HCL 2 MG/2ML IJ SOLN
INTRAMUSCULAR | Status: AC
  Administered 2014-11-10: 3 mg via INTRAVENOUS
  Filled 2014-11-10: qty 2

## 2014-11-10 MED ORDER — NOREPINEPHRINE BITARTRATE 1 MG/ML IV SOLN
0.0000 ug/min | INTRAVENOUS | Status: DC
  Filled 2014-11-10: qty 4

## 2014-11-10 MED ORDER — FENTANYL CITRATE (PF) 100 MCG/2ML IJ SOLN: 100.0000 ug | Freq: Once | INTRAMUSCULAR | Status: DC

## 2014-11-10 MED ORDER — MIDAZOLAM HCL 2 MG/2ML IJ SOLN
INTRAMUSCULAR | Status: DC | PRN
  Administered 2014-11-10: 2 mg via INTRAVENOUS

## 2014-11-10 MED ORDER — FAMOTIDINE IN NACL 20-0.9 MG/50ML-% IV SOLN
20.0000 mg | Freq: Two times a day (BID) | INTRAVENOUS | Status: DC
  Administered 2014-11-10 – 2014-11-15 (×11): 20 mg via INTRAVENOUS
  Filled 2014-11-10 (×12): qty 50

## 2014-11-10 MED ORDER — SUCCINYLCHOLINE CHLORIDE 20 MG/ML IJ SOLN
INTRAMUSCULAR | Status: AC | PRN
  Administered 2014-11-10: 100 mg via INTRAVENOUS

## 2014-11-10 MED ORDER — NITROGLYCERIN 1 MG/10 ML FOR IR/CATH LAB
INTRA_ARTERIAL | Status: AC
  Filled 2014-11-10: qty 10

## 2014-11-10 MED ORDER — SODIUM CHLORIDE 0.9 % IV SOLN
1.0000 g | Freq: Once | INTRAVENOUS | Status: AC
  Administered 2014-11-10: 1 g via INTRAVENOUS
  Filled 2014-11-10: qty 10

## 2014-11-10 MED ORDER — FENTANYL CITRATE (PF) 100 MCG/2ML IJ SOLN
50.0000 ug | Freq: Once | INTRAMUSCULAR | Status: AC
  Administered 2014-11-10: 50 ug via INTRAVENOUS
  Filled 2014-11-10: qty 2

## 2014-11-10 MED ORDER — CISATRACURIUM BOLUS VIA INFUSION
0.1000 mg/kg | Freq: Once | INTRAVENOUS | Status: AC
  Administered 2014-11-10: 8.4 mg via INTRAVENOUS
  Filled 2014-11-10: qty 9

## 2014-11-10 SURGICAL SUPPLY — 9 items
CATH INFINITI 6F ANG MULTIPACK (CATHETERS) ×2 IMPLANT
DEVICE CLOSURE MYNXGRIP 6/7F (Vascular Products) ×2 IMPLANT
HOVERMATT SINGLE USE (MISCELLANEOUS) ×2 IMPLANT
KIT HEART LEFT (KITS) ×2 IMPLANT
PACK CARDIAC CATHETERIZATION (CUSTOM PROCEDURE TRAY) ×2 IMPLANT
SHEATH PINNACLE 6F 10CM (SHEATH) ×2 IMPLANT
SYR MEDRAD MARK V 150ML (SYRINGE) ×2 IMPLANT
TRANSDUCER W/STOPCOCK (MISCELLANEOUS) ×2 IMPLANT
WIRE EMERALD 3MM-J .035X150CM (WIRE) ×2 IMPLANT

## 2014-11-10 NOTE — Clinical Documentation Improvement (Signed)
Critical Care  Can the diagnosis of CKD be further specified or is CKD present?   CKD Stage I - GFR greater than or equal to 90  CKD Stage II - GFR 60-89  CKD Stage III - GFR 30-59  CKD Stage IV - GFR 15-29  CKD Stage V - GFR < 15  ESRD (End Stage Renal Disease)  Other condition  Unable to clinically determine   Supporting Information: : (risk factors, signs and symptoms, diagnostics, treatment) GFR 39 on admission and now >60.  Creatinine was 2.13 and BUN 17.  Stated once once in record that he has CKD without stage.  Acute renal failure is diagnosed.  Please exercise your independent, professional judgment when responding. A specific answer is not anticipated or expected.   Thank You, Carlyn Reichert [facility] Health Information Management [place]

## 2014-11-10 NOTE — Procedures (Signed)
Arterial Catheter Insertion Procedure Note AKSHAJ SPARHAWK [id number] 06/01/1960  Procedure: Insertion of Arterial Catheter  Indications: Blood pressure monitoring and Frequent blood sampling  Procedure Details Consent: Unable to obtain consent because of altered level of consciousness. Time Out: Verified patient identification, verified procedure, site/side was marked, verified correct patient position, special equipment/implants available, medications/allergies/relevent history reviewed, required imaging and test results available.  Performed  Maximum sterile technique was used including antiseptics, cap, gloves, gown, hand hygiene, mask and sheet. Skin prep: Chlorhexidine; local anesthetic administered 20 gauge catheter was inserted into right radial artery using the Seldinger technique.  Evaluation Blood flow good; BP tracing good. Complications: No apparent complications.   Inez Pilgrim 11/10/2014

## 2014-11-10 NOTE — Progress Notes (Addendum)
eLink Physician-Brief Progress Note Patient Name: Brian Noble DOB: 13-Dec-1960 MRN: [id number]   Date of Service  11/10/2014  HPI/Events of Note  ABG = 7.28/32/39/16 - although sat = 100% on oximeter.  eICU Interventions  Increase TV to 570 mL and check ABG at 8 AM.      Intervention Category Major Interventions: Respiratory failure - evaluation and management  [name] 11/10/2014, 7:00 AM

## 2014-11-10 NOTE — Progress Notes (Signed)
I stat ABG results reported to [facility] RN for MD at [phone]. RT also notified results were posted. PO2 39 with O2 sat on ABG 79%

## 2014-11-10 NOTE — Progress Notes (Signed)
Attempted a-line twice, unsuccessful only got flash back both times. Stepped away to gather more a-line equipment for my Charge  RT can try and attempt to get the a-line. Now RN's at bedside cleaning pt frequent bowel movements. Waitied fifteen minutes RN's still at bedside cleaning. RT aware that is assigned to the floor.

## 2014-11-10 NOTE — Progress Notes (Signed)
Utilization Review Completed.Brian Noble T8/23/2016

## 2014-11-10 NOTE — Consult Note (Addendum)
\   CARDIOLOGY CONSULT NOTE  Patient ID: Brian Noble MRN: [id number] DOB/AGE: August 26, 1960 54 y.o.  Admit date: 11/10/2014 Primary Cardiologist: New Reason for Consultation: Cardiac arrest  HPI: 54 yo with history of cardiomyopathy (per wife's report, recovered), HTN, CKD, hyperlipidemia came to ER post-cardiac arrest. 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 He got home from the revival and reported that his head felt funny. He lay down on the sofa and was noted to have decreased consciousness then passed out and rolled onto the floor. Wife witnessed this. She started CPR. EMS arrived and patient was noted to be in VF or VT and was defibrillated. In the ER, he was groaning and moving but not fully conscious. He was thought to have aspirated. He had a PEA arrest in the ER thought to be due to hypoxemia that recovered with epinephrine and CPR. He currently is intubated and in   Review of systems complete and found to be negative unless listed above in HPI  Past Medical History: 1. Cardiomyopathy: ?Nonischemic. Per wife, "heart functioning at 20%" 7-8 years ago but later improved back to normal (she thinks).  2. CKD 3. HTN 4. Hyperlipidemia  FH: Sister had a heart transplant  Social History   Social History  . Marital Status: Married    Spouse Name: N/A  . Number of Children: N/A  . Years of Education: N/A   Occupational History  . Not on file.   Social History Main Topics  . Smoking status: Not on file  . Smokeless tobacco: Not on file  . Alcohol Use: Not on file  . Drug Use: Not on file  . Sexual Activity: Not on file   Other Topics Concern  . Not on file   Social History Narrative  . No narrative on file    Meds:  ASA 81 Coreg 6.25 bid Lisinopril 20 daily Amlodipine 10 daily HCTZ 25 daily Lovastatin 20 daily  Physical exam Blood pressure 78/42, pulse 98, temperature 95.9  F (35.5 C), resp. rate 30, weight 195 lb (88.451 kg). General: Intubated/sedated Neck: No JVD, no thyromegaly or thyroid nodule.  Lungs: Coarse breath sounds bilaterally CV: Nondisplaced PMI. Heart regular S1/S2, no S3/S4, no murmur. No peripheral edema. No carotid bruit. Trace pedal pulses.  Abdomen: Soft, nontender, no hepatosplenomegaly, no distention.  Skin: Intact without lesions or rashes.  Neurologic: Intubated/sedated  Extremities: No clubbing or cyanosis.  HEENT: Normal.   Labs:  Lab Results  Component Value Date   WBC 10.7* 11/10/2014   HGB 13.3 11/10/2014   HCT 39.0 11/10/2014   MCV 91.2 11/10/2014   PLT 218 11/10/2014    Recent Labs Lab 11/10/14 0110  NA 140  K 2.5*  CL 103  BUN 21*  CREATININE 2.00*  GLUCOSE 313*  TnI 0  Radiology: - CXR: Right upper lung opacity  EKG: NSR, 1st degree AVB, LBBB  ASSESSMENT AND PLAN: 54 yo with history of cardiomyopathy (per wife's report, recovered), HTN, CKD, hyperlipidemia came to ER post-cardiac arrest.  Initially, he had VT vs vfib arrest at home and was defibrillated, then he probably aspirated and had PEA arrest.   1. Cardiac arrest: Initially VT versus VF, then PEA likely related to hypoxemia.  I am concerned that the initial event was related to ischemia.  He has LBBB on ECG, no prior ECG available.  He had minimal down-time with quick CPR and ROSC after defibrillation.  The other possibility is that he has  a long-standing cardiomyopathy and had an arrest in the setting of the cardiomyopathy.  His wife thinks that his heart function had improved to normal from the past but I have no records to confirm this.  - Hypothermia protocol per CCM - Coronary angiography (minimize contrast given CKD) urgently.  - Echocardiogram 2. Hypotension: Post-arrest and sedation.  He is on norepinephrine.  As above, will get echo.  3. Suspect aspiration: Intubated, per CCM. 4. CKD:  Follow creatinine closely post-angiography.  5. Neuro: There was some question of left-sided weakness when he was initially in the ER (but not fully conscious).  He will have a head CT stat to rule out intracerebral hemorrhage.    Marca Ancona 11/10/2014 2:08 AM

## 2014-11-10 NOTE — Procedures (Signed)
ELECTROENCEPHALOGRAM REPORT  Patient: Brian Noble       Room #: 7X41 EEG No. ID: [id number] Age: 54 y.o.        Sex: male Referring Physician: Delton Coombes, R Report Date:  11/10/2014        Interpreting Physician: Aline Brochure  History: Brian Noble is an 54 y.o. male status post cardiac arrest at home with downtime less than 20 minutes; intubated in the ED; clean carotids on cath; currently sedated and on hypothermia protocol.  Indications for study:  Rule out encephalopathy; rule out seizure activity.  Technique: This is an 18 channel routine scalp EEG performed at the bedside with bipolar and monopolar montages arranged in accordance to the international 10/20 system of electrode placement.   Description: Patient was intubated and on mechanical ventilation as well as on hypothermia protocol and sedated at the time of this study. Predominant background activity consisted of low amplitude diffuse symmetrical mixed delta and theta activity with frequent occurrences of normal sleep spindles and vertex waves. Low amplitude beta activity was recorded from the frontal and central regions as well. No epileptiform discharges were recorded. Photic stimulation was not performed.  Interpretation: This EEG shows a pattern of continuous slowing activity consistent with normal sleep. Mild baseline encephalopathic state is unlikely but cannot be completely ruled out at this point. Repeat EEG study without sedation and following rewarming to normal body temperature is recommended.   Venetia Maxon M.D. [facility] [phone]

## 2014-11-10 NOTE — Code Documentation (Signed)
Lab at the bedside 

## 2014-11-10 NOTE — Progress Notes (Signed)
eLink Physician-Brief Progress Note Patient Name: ABDU BARNOSKY DOB: Sep 15, 1960 MRN: [id number]   Date of Service  11/10/2014  HPI/Events of Note    eICU Interventions  Hypokalemia -repleted Hypocalcemia repleted     Intervention Category Intermediate Interventions: Electrolyte abnormality - evaluation and management  ALVA,RAKESH V. 11/10/2014, 4:06 PM

## 2014-11-10 NOTE — Progress Notes (Signed)
Echocardiogram 2D Echocardiogram has been performed.  Dorothey Baseman 11/10/2014, 3:25 PM

## 2014-11-10 NOTE — Progress Notes (Signed)
A-line attempted several times per RTs Terri and Everlean Alstrom. Unable to complete due to nursing staff not allowing RTs to finish as pt had BM and nursing wanted to clean him up.

## 2014-11-10 NOTE — Procedures (Signed)
Central Venous Catheter Insertion Procedure Note Brian Noble [id number] 1960/07/05  Procedure: Insertion of Central Venous Catheter Indications: Assessment of intravascular volume, Drug and/or fluid administration and Frequent blood sampling  Procedure Details Consent: Risks of procedure as well as the alternatives and risks of each were explained to the (patient/caregiver).  Consent for procedure obtained. Time Out: Verified patient identification, verified procedure, site/side was marked, verified correct patient position, special equipment/implants available, medications/allergies/relevent history reviewed, required imaging and test results available.  Performed  Maximum sterile technique was used including antiseptics, cap, gloves, gown, hand hygiene, mask and sheet. Skin prep: Chlorhexidine; local anesthetic administered A antimicrobial bonded/coated triple lumen catheter was placed in the left internal jugular vein using the Seldinger technique.  Evaluation Blood flow good Complications: No apparent complications Patient did tolerate procedure well. Chest X-ray ordered to verify placement.  CXR: pending.  U/S used in placement.  YACOUB,WESAM 11/10/2014, 12:55 PM

## 2014-11-10 NOTE — ED Notes (Signed)
Dr. Shirlee Latch paged @0110 .

## 2014-11-10 NOTE — ED Provider Notes (Signed)
CSN: [id number]     Arrival date & time 11/10/14  0014 History  This chart was scribed for Loren Racer, MD by Evon Slack, ED Scribe. This patient was seen in room  and the patient's care was started at 12:33 AM.    Chief Complaint  Patient presents with  . Cardiac Arrest   The history is provided by the EMS personnel. No language interpreter was used.   HPI Comments: Level 5 Caveat: Acuity of Patient  Brian Noble is a 54 y.o. male brought in by ambulance, who presents to the Emergency Department post cardiac arrest onset tonight PTA. Family reports that the patient was not feeling well this evening and complaining of dizziness. Went to lay down on the couch then shortly after rolled off the couch gasping for air and became unresponsive. Patient's wife witnessed event. EMS called and CPR initiated. Ems states that on arrival patient was in ventricular fibrillation and 2 shocks were given as well as epinephrine and amiodarone. Total downtime was estimated at 10 minutes before ROSC. King tube was placed at that time. Pt has been combative and pulling for tube with right hand.   History reviewed. No pertinent past medical history. No past surgical history on file. No family history on file. Social History  Substance Use Topics  . Smoking status: None  . Smokeless tobacco: None  . Alcohol Use: None    Review of Systems  Unable to perform ROS: Acuity of condition     Allergies  Review of patient's allergies indicates not on file.  Home Medications   Prior to Admission medications   Not on File   BP 122/81 mmHg  Pulse 62  Temp(Src) 90.9 F (32.7 C) (Core (Comment))  Resp 28  Wt 185 lb 6.5 oz (84.1 kg)  SpO2 86%   Physical Exam  Constitutional: He appears well-developed and well-nourished. No distress.  Patient is thrashing and reaching for tube but not following commands  HENT:  Head: Normocephalic and atraumatic.  King tube in place with vomitus inside the tube   Eyes: EOM are normal. Pupils are equal, round, and reactive to light.  Pupils 3 mm and minimally reactive  Neck: Normal range of motion. Neck supple.  Cardiovascular: Normal rate and regular rhythm.  Exam reveals no gallop and no friction rub.   No murmur heard. Pulmonary/Chest: Effort normal. No respiratory distress. He has no wheezes. He has rales.  Rales throughout. Patient is making spontaneous effort to breathe  Abdominal: Soft. Bowel sounds are normal. He exhibits no distension and no mass. There is no tenderness. There is no rebound and no guarding.  Musculoskeletal: Normal range of motion. He exhibits no edema or tenderness.  No lower extremity swelling.  Neurological:  Patient is not moving the left leg and left arm appears to be weaker than the right. He is not following commands though responding to painful stimuli  Skin: Skin is warm and dry. No rash noted. No erythema.  Nursing note and vitals reviewed.   ED Course  INTUBATION Date/Time: 11/10/2014 12:30 AM Performed by: Loren Racer Authorized by: Ranae Palms, Ocia Simek Consent: The procedure was performed in an emergent situation. Indications: respiratory distress and  airway protection Intubation method: direct Preoxygenation: ILMA/LMA Pretreatment medications: midazolam Sedatives: etomidate Paralytic: succinylcholine Laryngoscope size: Mac 4 Tube size: 7.5 mm Tube type: cuffed Number of attempts: 3 Ventilation between attempts: BVM Cricoid pressure: yes Cords visualized: yes Post-procedure assessment: chest rise and CO2 detector Breath sounds: equal Cuff inflated:  yes ETT to lip: 25 cm Tube secured with: ETT holder Chest x-ray interpreted by me and radiologist. Chest x-ray findings: endotracheal tube in appropriate position Patient tolerance: Patient tolerated the procedure well with no immediate complications Comments: Patient with copious amounts of vomit in the pharynx. Difficult intubation. Unable to  pass tube with the assisted laryngoscope. Hypoxic episode with saturations dropping into the 50s. ET tube was then passed by direct visualization.  CENTRAL LINE Date/Time: 11/10/2014 1:30 AM Performed by: Loren Racer Authorized by: Ranae Palms, Devetta Hagenow Consent: The procedure was performed in an emergent situation. Patient identity confirmed: arm band Indications: vascular access Local anesthetic: lidocaine 1% without epinephrine Anesthetic total: 2 ml Preparation: skin prepped with 2% chlorhexidine Skin prep agent dried: skin prep agent completely dried prior to procedure Sterile barriers: all five maximum sterile barriers used - cap, mask, sterile gown, sterile gloves, and large sterile sheet Hand hygiene: hand hygiene performed prior to central venous catheter insertion Location details: right femoral Patient position: flat Catheter type: triple lumen Catheter size: 8 Fr Pre-procedure: landmarks identified Ultrasound guidance: no Number of attempts: 1 Successful placement: yes Post-procedure: line sutured and dressing applied Assessment: blood return through all ports and free fluid flow Patient tolerance: Patient tolerated the procedure well with no immediate complications   (including critical care time)    Labs Review Labs Reviewed  CBC WITH DIFFERENTIAL/PLATELET - Abnormal; Notable for the following:    WBC 10.7 (*)    RBC 4.11 (*)    Hemoglobin 11.9 (*)    HCT 37.5 (*)    Lymphs Abs 4.4 (*)    All other components within normal limits  COMPREHENSIVE METABOLIC PANEL - Abnormal; Notable for the following:    Potassium 2.6 (*)    CO2 16 (*)    Glucose, Bld 322 (*)    Creatinine, Ser 2.13 (*)    Calcium 7.7 (*)    Total Protein 5.5 (*)    Albumin 3.1 (*)    AST 242 (*)    ALT 314 (*)    GFR calc non Af Amer 33 (*)    GFR calc Af Amer 39 (*)    Anion gap 17 (*)    All other components within normal limits  BRAIN NATRIURETIC PEPTIDE - Abnormal; Notable for the  following:    B Natriuretic Peptide 136.7 (*)    All other components within normal limits  PROTIME-INR - Abnormal; Notable for the following:    Prothrombin Time 16.0 (*)    All other components within normal limits  TROPONIN I - Abnormal; Notable for the following:    Troponin I 0.13 (*)    All other components within normal limits  BASIC METABOLIC PANEL - Abnormal; Notable for the following:    Chloride 114 (*)    CO2 21 (*)    Glucose, Bld 149 (*)    Creatinine, Ser 1.51 (*)    Calcium 6.6 (*)    GFR calc non Af Amer 51 (*)    GFR calc Af Amer 59 (*)    Anion gap 4 (*)    All other components within normal limits  PROTIME-INR - Abnormal; Notable for the following:    Prothrombin Time 17.3 (*)    All other components within normal limits  CBC - Abnormal; Notable for the following:    Hemoglobin 12.3 (*)    HCT 38.1 (*)    All other components within normal limits  MAGNESIUM - Abnormal; Notable for the following:  Magnesium 1.6 (*)    All other components within normal limits  I-STAT CHEM 8, ED - Abnormal; Notable for the following:    Potassium 2.5 (*)    BUN 21 (*)    Creatinine, Ser 2.00 (*)    Glucose, Bld 313 (*)    Calcium, Ion 0.96 (*)    All other components within normal limits  POCT I-STAT, CHEM 8 - Abnormal; Notable for the following:    Chloride 117 (*)    Creatinine, Ser 1.40 (*)    Glucose, Bld 145 (*)    Calcium, Ion 0.99 (*)    Hemoglobin 12.9 (*)    HCT 38.0 (*)    All other components within normal limits  POCT I-STAT 3, ART BLOOD GAS (G3+) - Abnormal; Notable for the following:    pH, Arterial 7.161 (*)    pCO2 arterial 51.1 (*)    pO2, Arterial 52.0 (*)    Bicarbonate 19.0 (*)    Acid-base deficit 11.0 (*)    All other components within normal limits  POCT I-STAT 3, ART BLOOD GAS (G3+) - Abnormal; Notable for the following:    pH, Arterial 7.283 (*)    pCO2 arterial 32.2 (*)    pO2, Arterial 39.0 (*)    Bicarbonate 16.0 (*)    Acid-base  deficit 11.0 (*)    All other components within normal limits  CULTURE, BLOOD (ROUTINE X 2)  CULTURE, BLOOD (ROUTINE X 2)  CULTURE, RESPIRATORY (NON-EXPECTORATED)  MRSA PCR SCREENING  APTT  APTT  PHOSPHORUS  LACTIC ACID, PLASMA  URINALYSIS, ROUTINE W REFLEX MICROSCOPIC (NOT AT [facility])  TROPONIN I  TROPONIN I  CBC  BASIC METABOLIC PANEL  BASIC METABOLIC PANEL  BASIC METABOLIC PANEL  BASIC METABOLIC PANEL  BASIC METABOLIC PANEL  BASIC METABOLIC PANEL  PROTIME-INR  APTT  BLOOD GAS, ARTERIAL  I-STAT TROPOININ, ED    Imaging Review Ct Head Wo Contrast  11/10/2014   CLINICAL DATA:  Left-sided weakness.  Post cardiac arrest and CPR.  EXAM: CT HEAD WITHOUT CONTRAST  TECHNIQUE: Contiguous axial images were obtained from the base of the skull through the vertex without intravenous contrast.  COMPARISON:  None.  FINDINGS: Questionable vague area decreased density in the anterior right temporal lobe without mass effect. No intracranial hemorrhage or midline shift. No hydrocephalus. The basilar cisterns are patent. No cerebral edema. No intracranial fluid collection. Calvarium is intact. Minimal opacification of ethmoid air cells and small fluid levels in the sphenoid sinus, may be related to intubation. The mastoid air cells are well aerated.  IMPRESSION: Questionable vague area decreased density in the anterior right temporal lobe. This may reflect a region of acute or subacute ischemia versus volume averaging. There is no hemorrhage or mass effect.  These results were called by telephone at the time of interpretation on 11/10/2014 at 2:56 am to Dr. Loren Racer , who verbally acknowledged these results.   Electronically Signed   By: Rubye Oaks M.D.   On: 11/10/2014 02:56   Dg Chest Port 1 View  11/10/2014   CLINICAL DATA:  Hypoxia.  EXAM: PORTABLE CHEST - 1 VIEW  COMPARISON:  Earlier today at 0047 hour  FINDINGS: 0429 hour: Endotracheal tube is approximately 4.0 cm from the carina. Enteric  tube in place, tip and side port below the diaphragm not included in the field of view.  Progressive opacity in the right upper lobe, now abutting the minor fissure. Development of retrocardiac opacity and obscuration of the hemidiaphragm.  Increasing hazy opacity in the left midlung zone, partially obscured. Cardiomediastinal contours are unchanged, heart is mildly enlarged. Multiple overlying monitoring devices remain in place.  IMPRESSION: 1. Development of retrocardiac opacity and increasing hazy opacity in the left mid lung zone. This may reflect atelectasis or developing pulmonary edema. 2. Increasing right upper lobe opacity, and now all abutting the minor fissure. Pulmonary edema versus atelectasis again considered.   Electronically Signed   By: Rubye Oaks M.D.   On: 11/10/2014 04:34   Dg Chest Portable 1 View  11/10/2014   CLINICAL DATA:  Intubation.  Post CPR.  EXAM: PORTABLE CHEST - 1 VIEW  COMPARISON:  None.  FINDINGS: Endotracheal tube approximately 2.6 cm from the carina. Enteric tube in place, tip and side-port below the diaphragm not included in the field of view. Multiple overlying monitoring devices partially obscuring evaluation. Heart appears mildly enlarged. Lung volumes are low. Ill-defined hazy opacity in the right upper lung zone. Mild vascular congestion. No displaced rib fracture or large pneumothorax. No large pleural effusion.  IMPRESSION: 1. Endotracheal tube 2.6 cm from the carina.  Enteric tube in place. 2. Probable cardiomegaly. Ill-defined opacity in the right upper lung zone, may reflect asymmetric pulmonary edema, pneumonia or contusion given history of CPR. Multiple overlying monitoring devices partially obscure evaluation of the lung parenchyma.   Electronically Signed   By: Rubye Oaks M.D.   On: 11/10/2014 01:05   I have personally reviewed and evaluated these images and lab results as part of my medical decision-making.   EKG Interpretation   Date/Time:   Tuesday November 10 2014 00:53:09 EDT Ventricular Rate:  74 PR Interval:  264 QRS Duration: 142 QT Interval:  484 QTC Calculation: 537 R Axis:   -62 Text Interpretation:  Sinus rhythm Prolonged PR interval Left bundle  branch block Confirmed by Ranae Palms  MD, Halsey Persaud ([id number]) on 11/10/2014  12:59:02 AM      MDM   Final diagnoses:  Hypoxia   CRITICAL CARE Performed by: Loren Racer, MD Total critical care time: 75 min Critical care time was exclusive of separately billable procedures and treating other patients. Critical care was necessary to treat or prevent imminent or life-threatening deterioration. Critical care was time spent personally by me on the following activities: development of treatment plan with patient and/or surrogate as well as nursing, discussions with consultants, evaluation of patient's response to treatment, examination of patient, obtaining history from patient or surrogate, ordering and performing treatments and interventions, ordering and review of laboratory studies, ordering and review of radiographic studies, pulse oximetry and re-evaluation of patient's condition.       I personally performed the services described in this documentation, which was scribed in my presence. The recorded information has been reviewed and is accurate.  Patient with large amount of vomit in the Harts tube and the pharynx. Decision made to replace King tube in the emergency Department with ET tube for airway protection. King tube was removed. Copious amounts of vomit obscured views of the vocal cords. Patient had a hypoxic episode with saturations into the 50s. Difficult to ventilate with ambu bag. ET tube was eventually placed through direct visualization. Patient then had a PEA event. Cardiac compressions were initiated and 2 doses of epinephrine were given. PEA arrest lasted less than 10 minutes. Patient was initially hypertensive with ROSC but blood pressure dropped to systolic in  the 17P and 80s. Multiple EKGs were performed showing dynamic changes with interventricular blockage. No definite ST segment elevation.  Discussed with critical care and stated they would see the patient in the emergency department. I also spoke with Dr. Shirlee Latch this assisted stabilization of the patient for possible catheterization. Right femoral central line was placed. Levophed was initiated. Patient also was covered for aspiration pneumonia with broad-spectrum antibiotics. Patient was noted to be hypokalemic and replacement was started in the emergency department. Blood pressure improved. Patient was seen in the emergency department by Dr. Shirlee Latch and decision was made to take the patient to the Cath Lab if CT head did not show any acute bleed. CT head with questionable right temporal infarct but no bleed was present. Patient was then taken to the Cath Lab. Long discussion with patient's family. Has a history of cardiomyopathy in the past though this supposedly resolved.     Loren Racer, MD 11/10/14 [phone]

## 2014-11-10 NOTE — ED Notes (Signed)
Compressions started.

## 2014-11-10 NOTE — Progress Notes (Signed)
Sputum sample walked to lab by RT.

## 2014-11-10 NOTE — Progress Notes (Addendum)
Initial Nutrition Assessment  DOCUMENTATION CODES:   Not applicable  INTERVENTION:   Once pt is re-warmed, recommend:  Initiate Vital AF 1.2 @ 20 ml/hr via OGT and increase by 10 ml every 4 hours to goal rate of 70 ml/hr.   Tube feeding regimen provides 2016 kcal (99% of needs), 126 grams of protein, and 1362 ml of H2O.   NUTRITION DIAGNOSIS:   Inadequate oral intake related to inability to eat as evidenced by NPO status.  GOAL:   Patient will meet greater than or equal to 90% of their needs  MONITOR:   Vent status, Labs, Weight trends, Skin, I & O's  REASON FOR ASSESSMENT:   Ventilator    ASSESSMENT:   54 year old male who suffered a witnessed cardiac arrest at home. VF. Downtime reportedly less than 20 mins. Combative after ROSC. Intubated in ED and taken to cath lab where he was found to have clean coronary arteries. PCCM to see.   Patient is currently intubated on ventilator support. OGT in place MV: 16.6 L/min Temp (24hrs), Avg:92 F (33.3 C), Min:89.6 F (32 C), Max:95.9 F (35.5 C)  Propofol: n/a  Attempted to examine pt x 2, however, EEG being performed at times of visits  Pt is currently on hypothermia protocol, on Nimbex infusion.   Rectal tube inserted due to loose, watery stools.   Labs reviewed: K: 3.2.   Diet Order:   NPO  Skin:  Reviewed, no issues  Last BM:  11/10/14  Height:   Ht Readings from Last 1 Encounters:  11/10/14 5\' 8"  (1.727 m)    Weight:   Wt Readings from Last 1 Encounters:  11/10/14 185 lb 6.5 oz (84.1 kg)    Ideal Body Weight:  70 kg  BMI:  Body mass index is 28.2 kg/(m^2).  Estimated Nutritional Needs:   Kcal:  2028.8  Protein:  110-125 grams  Fluid:  >1.8 L  EDUCATION NEEDS:   No education needs identified at this time  Gael Londo A. Mayford Knife, RD, LDN, CDE Pager: [phone] After hours Pager: [phone]

## 2014-11-10 NOTE — Progress Notes (Signed)
Critical ABG results called to MD. Rate decreased to 25 and PEEP dropped to 12. Follow-up ABG in AM.

## 2014-11-10 NOTE — Progress Notes (Signed)
Chaplain was already in ED when post-CPR pt arrived and was taken to Trauma C. I learned from EMS that pt's wife had witnessed the event at home in [place] and was enroute from there. When pt's wife arrived I brought her to consult B. Several supportive friends were with her. Pt's brothers, aunt, grandmother and other family members arrived subsequently. Chaplain provided emotional support for pt's wife. 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 I brought Dr. Betsey Holiday to update family. Later Dr. Lita Mains updated family. After cat scan came back clear, a cardiologist met with family and said he was sending pt for heart catheterization. I took family to [facility] waiting area and informed cath lab that family was there. Pt's wife expressed appreciation for chaplain support.

## 2014-11-10 NOTE — ED Notes (Signed)
Spoke with MD to verify whether he wanted blood cultures and states he didn't feel like the pt needed them at this time.

## 2014-11-10 NOTE — Progress Notes (Signed)
EKG CRITICAL VALUE     12 lead EKG performed.  Critical value noted.Rocco Pauls, RN notified.   Wandalee Ferdinand, [place] 11/10/2014 7:33 AM

## 2014-11-10 NOTE — Progress Notes (Signed)
EEG Completed; Results Pending  

## 2014-11-10 NOTE — Progress Notes (Signed)
PULMONARY / CRITICAL CARE MEDICINE   Name: Brian Noble MRN: [id number] DOB: 08-08-1960    ADMISSION DATE:  11/10/2014 CONSULTATION DATE:  11/10/2014  REFERRING MD :  EDP  CHIEF COMPLAINT:  Witnessed arrest  INITIAL PRESENTATION: 54 year old male who suffered a witnessed cardiac arrest at home. VF. Downtime reportedly less than 20 mins. Combative after ROSC. Intubated in ED and taken to cath lab where he was found to have clean coronary arteries. PCCM to see.   STUDIES:  8/23 LHC > 8/23 CT head > Questionable vague area decreased density in the anterior right temporal lobe. This may reflect a region of acute or subacute ischemia versus volume averaging.  SIGNIFICANT EVENTS: 8/23 witnessed arrest, Cath, to ICU. Hypothermia protocol initiated.   HISTORY OF PRESENT ILLNESS:  53 year old male with PMH of HTN and questionable cardiomyopathy per wife suffered a witnessed cardiac arrest at home 8/23. 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 Afterwards, he complained of dizziness and that his head felt funny. He laid down on the sofa and lost consciousness, rolled onto floor. Family called 911 and initiated CPR. EMS arrived and noted VF or VT. Patient was defibrillated. In ED he was reportedly moaning, but not fully conscious. While in ED he was reportedly combative, and weaker on one side. CT of the head was performed and showed a questionable area of ischemia. He suffered an additional cardiac arrest, this time PEA. Thought to be secondary to hypoxemia. Short downtime recovered with CPR and epinephrine. He was intubated in ED and taken to cath lab where he was found to have clean coronary arteries and a severely reduced LVEF. He was transferred to ICU, PCCM to assume care.    SUBJECTIVE: No events overnight.  VITAL SIGNS: Temp:  [89.6 F (32 C)-95.9 F (35.5 C)] 89.6 F (32 C) (08/23 1000) Pulse Rate:  [0-98] 60 (08/23 0824) Resp:  [4-44] 28 (08/23 0824) BP: (78-130)/(42-99) 130/99 mmHg  (08/23 0824) SpO2:  [0 %-97 %] 97 % (08/23 0824) Arterial Line BP: (129-140)/(71-83) 140/83 mmHg (08/23 0800) FiO2 (%):  [100 %] 100 % (08/23 0824) Weight:  [84.1 kg (185 lb 6.5 oz)-88.451 kg (195 lb)] 84.1 kg (185 lb 6.5 oz) (08/23 0350) HEMODYNAMICS:   VENTILATOR SETTINGS: Vent Mode:  [-] PRVC FiO2 (%):  [100 %] 100 % Set Rate:  [18 bmp-28 bmp] 28 bmp Vt Set:  [500 mL-670 mL] 670 mL PEEP:  [8 cmH20-10 cmH20] 10 cmH20 Plateau Pressure:  [17 cmH20-27 cmH20] 26 cmH20 INTAKE / OUTPUT:  Intake/Output Summary (Last 24 hours) at 11/10/14 1051 Last data filed at 11/10/14 1004  Gross per 24 hour  Intake 923.31 ml  Output   1250 ml  Net -326.69 ml   PHYSICAL EXAMINATION: General:  Middle aged male, overweight, on vent. Sedated and paralyzed. Neuro:  Sedated and paralyzed on vent. HEENT:  /AT, no JVD, PERRL Cardiovascular:  RRR, no MRG Lungs:  Clear bilateral breath sounds Abdomen:  Soft, non-tender, non-distended Musculoskeletal:  No acute deformity or ROM limitation.  Skin:  Grossly intact  LABS:  CBC  Recent Labs Lab 11/10/14 0105  11/10/14 0505 11/10/14 0508 11/10/14 0710 11/10/14 0927  WBC 10.7*  --  9.7  --   --   --   HGB 11.9*  < > 12.3* 12.9* 15.3 15.6  HCT 37.5*  < > 38.1* 38.0* 45.0 46.0  PLT 218  --  237  --   --   --   < > =  values in this interval not displayed. Coag's  Recent Labs Lab 11/10/14 0105 11/10/14 0505  APTT 30 30  INR 1.27 1.40   BMET  Recent Labs Lab 11/10/14 0105  11/10/14 0505 11/10/14 0508 11/10/14 0710 11/10/14 0927  NA 139  < > 139 138 142 142  K 2.6*  < > 3.8 3.6 3.6 3.2*  CL 106  < > 114* 117* 109 109  CO2 16*  --  21*  --   --   --   BUN 17  < > 16 17 17 16   CREATININE 2.13*  < > 1.51* 1.40* 1.10 1.00  GLUCOSE 322*  < > 149* 145* 140* 138*  < > = values in this interval not displayed. Electrolytes  Recent Labs Lab 11/10/14 0105 11/10/14 0505  CALCIUM 7.7* 6.6*  MG  --  1.6*  PHOS  --  3.8   Sepsis  Markers  Recent Labs Lab 11/10/14 0556  LATICACIDVEN 2.0   ABG  Recent Labs Lab 11/10/14 0514 11/10/14 0626  PHART 7.161* 7.283*  PCO2ART 51.1* 32.2*  PO2ART 52.0* 39.0*   Liver Enzymes  Recent Labs Lab 11/10/14 0105  AST 242*  ALT 314*  ALKPHOS 50  BILITOT 1.0  ALBUMIN 3.1*   Cardiac Enzymes  Recent Labs Lab 11/10/14 0505  TROPONINI 0.13*   Glucose  Recent Labs Lab 11/10/14 0507 11/10/14 0557 11/10/14 0708 11/10/14 0812 11/10/14 0923  GLUCAP 128* 118* 138* 139* 134*    Imaging Ct Head Wo Contrast  11/10/2014   CLINICAL DATA:  Left-sided weakness.  Post cardiac arrest and CPR.  EXAM: CT HEAD WITHOUT CONTRAST  TECHNIQUE: Contiguous axial images were obtained from the base of the skull through the vertex without intravenous contrast.  COMPARISON:  None.  FINDINGS: Questionable vague area decreased density in the anterior right temporal lobe without mass effect. No intracranial hemorrhage or midline shift. No hydrocephalus. The basilar cisterns are patent. No cerebral edema. No intracranial fluid collection. Calvarium is intact. Minimal opacification of ethmoid air cells and small fluid levels in the sphenoid sinus, may be related to intubation. The mastoid air cells are well aerated.  IMPRESSION: Questionable vague area decreased density in the anterior right temporal lobe. This may reflect a region of acute or subacute ischemia versus volume averaging. There is no hemorrhage or mass effect.  These results were called by telephone at the time of interpretation on 11/10/2014 at 2:56 am to Dr. Loren Racer , who verbally acknowledged these results.   Electronically Signed   By: Rubye Oaks M.D.   On: 11/10/2014 02:56   Dg Chest Port 1 View  11/10/2014   CLINICAL DATA:  Hypoxia.  EXAM: PORTABLE CHEST - 1 VIEW  COMPARISON:  Earlier today at 0047 hour  FINDINGS: 0429 hour: Endotracheal tube is approximately 4.0 cm from the carina. Enteric tube in place, tip and  side port below the diaphragm not included in the field of view.  Progressive opacity in the right upper lobe, now abutting the minor fissure. Development of retrocardiac opacity and obscuration of the hemidiaphragm. Increasing hazy opacity in the left midlung zone, partially obscured. Cardiomediastinal contours are unchanged, heart is mildly enlarged. Multiple overlying monitoring devices remain in place.  IMPRESSION: 1. Development of retrocardiac opacity and increasing hazy opacity in the left mid lung zone. This may reflect atelectasis or developing pulmonary edema. 2. Increasing right upper lobe opacity, and now all abutting the minor fissure. Pulmonary edema versus atelectasis again considered.   Electronically  Signed   By: Rubye Oaks M.D.   On: 11/10/2014 04:34   Dg Chest Portable 1 View  11/10/2014   CLINICAL DATA:  Intubation.  Post CPR.  EXAM: PORTABLE CHEST - 1 VIEW  COMPARISON:  None.  FINDINGS: Endotracheal tube approximately 2.6 cm from the carina. Enteric tube in place, tip and side-port below the diaphragm not included in the field of view. Multiple overlying monitoring devices partially obscuring evaluation. Heart appears mildly enlarged. Lung volumes are low. Ill-defined hazy opacity in the right upper lung zone. Mild vascular congestion. No displaced rib fracture or large pneumothorax. No large pleural effusion.  IMPRESSION: 1. Endotracheal tube 2.6 cm from the carina.  Enteric tube in place. 2. Probable cardiomegaly. Ill-defined opacity in the right upper lung zone, may reflect asymmetric pulmonary edema, pneumonia or contusion given history of CPR. Multiple overlying monitoring devices partially obscure evaluation of the lung parenchyma.   Electronically Signed   By: Rubye Oaks M.D.   On: 11/10/2014 01:05     ASSESSMENT / PLAN:  PULMONARY OETT 8/23 >>> A: Acute hypoxemic respiratory failure in setting cardiac arrest ?Aspiration pneumonitis  P:   Full vent  support Increase PEEP to 14 Follow CXR, ABG in AM. VAP bundle  CARDIOVASCULAR CVL R fem 8/23 >8/23 A:  Cardiogenic shock S/p VF arrest Acute systolic CHF H/o HTN  P:  Telemetry monitoring MAP goal > 42mm/Hg Levophed to achieve MAP goal currently at 20 Gentle volume Trend troponin, lactic Therapeutic hypothermia protocol 33 degrees Appreciate cardiology assistance  Will replace femoral line today.  RENAL A:   AKI Hypokalemia Hypocalcemia  P:   Serial BMET IVF as ordered Replace electrolytes as indicated.   GASTROINTESTINAL A:   No acute issues  P:   NPO for now PPI  HEMATOLOGIC A:   Anemia  P:  Serial CBC Transfuse per ICU guidelines Heparin SQ  INFECTIOUS A:   Concern aspiration  P:   BCx2 8/23 >>> Sputum 8/23 >>> Unasyn 8/23 >>> Trend WBC and fever curve  ENDOCRINE A:   Hyperglycemia without history of DM  P:   Insulin gtt CBG monitoring  NEUROLOGIC A:   Acute anoxic encephalopathy ?Area of ischemia/CVA  P:   RASS goal: -5. Versed/fentanyl infusions. Nimbex for hypothermia protocol. WUA when meets criteria.  Consult neurology appreciated.   FAMILY  - Updates: No family bedside.  - Inter-disciplinary family meet or Palliative Care meeting due by:  9/1  The patient is critically ill with multiple organ systems failure and requires high complexity decision making for assessment and support, frequent evaluation and titration of therapies, application of advanced monitoring technologies and extensive interpretation of multiple databases.   Critical Care Time devoted to patient care services described in this note is  35  Minutes. This time reflects time of care of this signee Dr Koren Bound. This critical care time does not reflect procedure time, or teaching time or supervisory time of PA/NP/Med student/Med Resident etc but could involve care discussion time.  Alyson Reedy, M.D. [facility] Pulmonary/Critical Care  Medicine. Pager: [phone]. After hours pager: [phone].

## 2014-11-10 NOTE — H&P (Signed)
PULMONARY / CRITICAL CARE MEDICINE   Name: Brian Noble MRN: [id number] DOB: Aug 15, 1960    ADMISSION DATE:  11/10/2014 CONSULTATION DATE:  11/10/2014  REFERRING MD :  EDP  CHIEF COMPLAINT:  Witnessed arrest  INITIAL PRESENTATION: 54 year old male who suffered a witnessed cardiac arrest at home. VF. Downtime reportedly less than 20 mins. Combative after ROSC. Intubated in ED and taken to cath lab where he was found to have clean coronary arteries. PCCM to see.   STUDIES:  8/23 LHC > 8/23 CT head > Questionable vague area decreased density in the anterior right temporal lobe. This may reflect a region of acute or subacute ischemia versus volume averaging.  SIGNIFICANT EVENTS: 8/23 witnessed arrest, Cath, to ICU. Hypothermia protocol initiated.   HISTORY OF PRESENT ILLNESS:  54 year old male with PMH of HTN and questionable cardiomyopathy per wife suffered a witnessed cardiac arrest at home 8/23. 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 Afterwards, he complained of dizziness and that his head felt funny. He laid down on the sofa and lost consciousness, rolled onto floor. Family called 911 and initiated CPR. EMS arrived and noted VF or VT. Patient was defibrillated. In ED he was reportedly moaning, but not fully conscious. While in ED he was reportedly combative, and weaker on one side. CT of the head was performed and showed a questionable area of ischemia. He suffered an additional cardiac arrest, this time PEA. Thought to be secondary to hypoxemia. Short downtime recovered with CPR and epinephrine. He was intubated in ED and taken to cath lab where he was found to have clean coronary arteries and a severely reduced LVEF. He was transferred to ICU, PCCM to assume care.    PAST MEDICAL HISTORY :   has no past medical history on file.  has no past surgical history on file. Prior to Admission medications   Not on File   Allergies not on file  FAMILY HISTORY:  has no family status  information on file.  SOCIAL HISTORY:    REVIEW OF SYSTEMS:  unable  SUBJECTIVE:   VITAL SIGNS: Temp:  [95.9 F (35.5 C)] 95.9 F (35.5 C) (08/23 0130) Pulse Rate:  [98] 98 (08/23 0030) Resp:  [14-30] 30 (08/23 0130) BP: (78-115)/(42-66) 78/42 mmHg (08/23 0130) SpO2:  [90 %] 90 % (08/23 0244) FiO2 (%):  [100 %] 100 % (08/23 0030) Weight:  [88.451 kg (195 lb)] 88.451 kg (195 lb) (08/23 0057) HEMODYNAMICS:   VENTILATOR SETTINGS: Vent Mode:  [-] PRVC FiO2 (%):  [100 %] 100 % Set Rate:  [22 bmp] 22 bmp Vt Set:  [540 mL] 540 mL PEEP:  [8 cmH20] 8 cmH20 Plateau Pressure:  [23 cmH20] 23 cmH20 INTAKE / OUTPUT: No intake or output data in the 24 hours ending 11/10/14 0303  PHYSICAL EXAMINATION: General:  Middle aged male, overweight, on vent.  Neuro:  Sedated on vent, occasional movements, non-purposeful HEENT:  [place]/AT, no JVD, PERRL Cardiovascular:  RRR, no MRG Lungs:  Clear bilateral breath sounds Abdomen:  Soft, non-tender, non-distended Musculoskeletal:  No acute deformity or ROM limitation.  Skin:  Grossly intact  LABS:  CBC  Recent Labs Lab 11/10/14 0105 11/10/14 0110  WBC 10.7*  --   HGB 11.9* 13.3  HCT 37.5* 39.0  PLT 218  --    Coag's  Recent Labs Lab 11/10/14 0105  APTT 30  INR 1.27   BMET  Recent Labs Lab 11/10/14 0105 11/10/14 0110  NA 139 140  K 2.6* 2.5*  CL 106 103  CO2 16*  --   BUN 17 21*  CREATININE 2.13* 2.00*  GLUCOSE 322* 313*   Electrolytes  Recent Labs Lab 11/10/14 0105  CALCIUM 7.7*   Sepsis Markers No results for input(s): LATICACIDVEN, PROCALCITON, O2SATVEN in the last 168 hours. ABG No results for input(s): PHART, PCO2ART, PO2ART in the last 168 hours. Liver Enzymes  Recent Labs Lab 11/10/14 0105  AST 242*  ALT 314*  ALKPHOS 50  BILITOT 1.0  ALBUMIN 3.1*   Cardiac Enzymes No results for input(s): TROPONINI, PROBNP in the last 168 hours. Glucose No results for input(s): GLUCAP in the last 168  hours.  Imaging Ct Head Wo Contrast  11/10/2014   CLINICAL DATA:  Left-sided weakness.  Post cardiac arrest and CPR.  EXAM: CT HEAD WITHOUT CONTRAST  TECHNIQUE: Contiguous axial images were obtained from the base of the skull through the vertex without intravenous contrast.  COMPARISON:  None.  FINDINGS: Questionable vague area decreased density in the anterior right temporal lobe without mass effect. No intracranial hemorrhage or midline shift. No hydrocephalus. The basilar cisterns are patent. No cerebral edema. No intracranial fluid collection. Calvarium is intact. Minimal opacification of ethmoid air cells and small fluid levels in the sphenoid sinus, may be related to intubation. The mastoid air cells are well aerated.  IMPRESSION: Questionable vague area decreased density in the anterior right temporal lobe. This may reflect a region of acute or subacute ischemia versus volume averaging. There is no hemorrhage or mass effect.  These results were called by telephone at the time of interpretation on 11/10/2014 at 2:56 am to Dr. Loren Racer , who verbally acknowledged these results.   Electronically Signed   By: Rubye Oaks M.D.   On: 11/10/2014 02:56   Dg Chest Portable 1 View  11/10/2014   CLINICAL DATA:  Intubation.  Post CPR.  EXAM: PORTABLE CHEST - 1 VIEW  COMPARISON:  None.  FINDINGS: Endotracheal tube approximately 2.6 cm from the carina. Enteric tube in place, tip and side-port below the diaphragm not included in the field of view. Multiple overlying monitoring devices partially obscuring evaluation. Heart appears mildly enlarged. Lung volumes are low. Ill-defined hazy opacity in the right upper lung zone. Mild vascular congestion. No displaced rib fracture or large pneumothorax. No large pleural effusion.  IMPRESSION: 1. Endotracheal tube 2.6 cm from the carina.  Enteric tube in place. 2. Probable cardiomegaly. Ill-defined opacity in the right upper lung zone, may reflect asymmetric  pulmonary edema, pneumonia or contusion given history of CPR. Multiple overlying monitoring devices partially obscure evaluation of the lung parenchyma.   Electronically Signed   By: Rubye Oaks M.D.   On: 11/10/2014 01:05     ASSESSMENT / PLAN:  PULMONARY OETT 8/23 >>> A: Acute hypoxemic respiratory failure in setting cardiac arrest ?Aspiration pneumonitis  P:   Full vent support Follow CXR, ABG VAP bundle 8 PEEP  CARDIOVASCULAR CVL R fem 8/23 > A:  Cardiogenic shock S/p VF arrest Acute systolic CHF H/o HTN  P:  Telemetry monitoring MAP goal > 77mm/Hg Levophed to achieve MAP goal Gentle volume Trend troponin, lactic Therapeutic hypothermia protocol 33 degrees Will likely need CVL changed 8/23 AM Appreciate cardiology assistance   RENAL A:   AKI Hypokalemia Hypocalcemia  P:   Serial Bmet Treat K, Mag aggressively  Replace electrolytes as indicated.   GASTROINTESTINAL A:   No acute issues  P:   NPO for now PPI  HEMATOLOGIC  A:   Anemia  P:  Serial CBC Transfuse per ICU guidelines Heparin SQ  INFECTIOUS A:   Concern aspiration  P:   BCx2 8/23 >>> Sputum 8/23 >>> Unasyn 8/23 >>> Trend WBC and fever curve  ENDOCRINE A:   Hyperglycemia without history of DM  P:   Insulin gtt CBG monitoring   NEUROLOGIC A:   Acute anoxic encephalopathy ?Area of ischemia/CVA  P:   RASS goal: -5 Versed/fentanyl infusions Nimbex for hypothermia protocol WUA when meets criteria.  Consult neurology in AM   FAMILY  - Updates:   - Inter-disciplinary family meet or Palliative Care meeting due by:  9/1   Joneen Roach, AGACNP-BC [place] [facility]/Critical Care Pager [phone] or [phone] 11/10/2014 3:23 AM   Attending Note:  I have examined patient, reviewed labs, studies and notes. I have discussed the case with Henreitta Leber, and I agree with the data and plans as amended above. Witnessed arrest in pt with reported cardiomyopathy.  Underwent CPR, exchange King airway for ETT, then cardiac cath. No CAD found. On my eval in the ICU he was sedated and intubated, requiring norepi 20, marginal SpO2 on FiO2 1.00 + PEEP 8. Will initiate hypothermia, recheck CXR and consider increasing PEEP. Independent critical care time is 60 minutes.   Levy Pupa, MD, PhD 11/10/2014, 7:56 AM [place] [facility] [phone] or if no answer [phone]

## 2014-11-10 NOTE — Progress Notes (Signed)
In to assess pt with SpO2 of 81%, waveform good, correlating HR between leads and Sat probe. RNs at bedside were asked about reporting the SpO2 to RT. They replied they were uncertain if the value was true due to pt hypothermia, however, if pt had an A-line an ABG sample could be obtained. Pt was removed from ventilator, BMV'd and lavaged for return of saline. Charge RT called to bedside to assist. After 15 minutes, SpO2 increased to 89, pt returned to vent and NP contacted. ISTAT ABG obtained at bedside per charge RT and A-line inserted while unit RT ran ABG. Pt evaluated for 8cc Vt post gas, adjustments made to rate. NP called to verify and change vent settings. Follow up ABG pending.

## 2014-11-11 ENCOUNTER — Inpatient Hospital Stay (HOSPITAL_COMMUNITY): Payer: BLUE CROSS/BLUE SHIELD

## 2014-11-11 DIAGNOSIS — I429 Cardiomyopathy, unspecified: Secondary | ICD-10-CM

## 2014-11-11 DIAGNOSIS — R570 Cardiogenic shock: Secondary | ICD-10-CM | POA: Insufficient documentation

## 2014-11-11 DIAGNOSIS — R0902 Hypoxemia: Secondary | ICD-10-CM | POA: Insufficient documentation

## 2014-11-11 LAB — BASIC METABOLIC PANEL
Anion gap: 9 (ref 5–15)
Anion gap: 10 (ref 5–15)
BUN: 8 mg/dL (ref 6–20)
BUN: 7 mg/dL (ref 6–20)
CO2: 16 mmol/L — ABNORMAL LOW (ref 22–32)
CO2: 17 mmol/L — ABNORMAL LOW (ref 22–32)
Calcium: 7.7 mg/dL — ABNORMAL LOW (ref 8.9–10.3)
Calcium: 7.6 mg/dL — ABNORMAL LOW (ref 8.9–10.3)
Chloride: 110 mmol/L (ref 101–111)
Chloride: 108 mmol/L (ref 101–111)
Creatinine, Ser: 0.74 mg/dL (ref 0.61–1.24)
Creatinine, Ser: 0.77 mg/dL (ref 0.61–1.24)
GFR calc Af Amer: >60 mL/min (ref >60)
GFR calc Af Amer: >60 mL/min (ref >60)
GFR calc non Af Amer: >60 mL/min (ref >60)
GFR calc non Af Amer: >60 mL/min (ref >60)
Glucose, Bld: 105 mg/dL — ABNORMAL HIGH (ref 65–99)
Glucose, Bld: 95 mg/dL (ref 65–99)
Potassium: 4.6 mmol/L (ref 3.5–5.1)
Potassium: 3.4 mmol/L — ABNORMAL LOW (ref 3.5–5.1)
Sodium: 135 mmol/L (ref 135–145)
Sodium: 135 mmol/L (ref 135–145)

## 2014-11-11 LAB — BLOOD GAS, ARTERIAL
Acid-base deficit: 6.7 mmol/L — ABNORMAL HIGH (ref 0.0–2.0)
Bicarbonate: 16.8 mEq/L — ABNORMAL LOW (ref 20.0–24.0)
Drawn by: 440661
FIO2: 40
MECHVT: 550 mL
O2 Saturation: 97.5 %
PEEP: 5 cmH2O
Patient temperature: 91.9
RATE: 25 resp/min
TCO2: 17.6 mmol/L (ref 0–100)
pCO2 arterial: 21.8 mmHg — ABNORMAL LOW (ref 35.0–45.0)
pH, Arterial: 7.48 — ABNORMAL HIGH (ref 7.350–7.450)
pO2, Arterial: 79.8 mmHg — ABNORMAL LOW (ref 80.0–100.0)

## 2014-11-11 LAB — GLUCOSE, CAPILLARY
Glucose-Capillary: 105 mg/dL — ABNORMAL HIGH (ref 65–99)
Glucose-Capillary: 85 mg/dL (ref 65–99)
Glucose-Capillary: 89 mg/dL (ref 65–99)
Glucose-Capillary: 95 mg/dL (ref 65–99)
Glucose-Capillary: 96 mg/dL (ref 65–99)
Glucose-Capillary: 97 mg/dL (ref 65–99)

## 2014-11-11 LAB — POCT I-STAT, CHEM 8
BUN: 11 mg/dL (ref 6–20)
Calcium, Ion: 1.05 mmol/L — ABNORMAL LOW (ref 1.12–1.23)
Chloride: 109 mmol/L (ref 101–111)
Creatinine, Ser: 0.7 mg/dL (ref 0.61–1.24)
Glucose, Bld: 101 mg/dL — ABNORMAL HIGH (ref 65–99)
HCT: 43 % (ref 39.0–52.0)
Hemoglobin: 14.6 g/dL (ref 13.0–17.0)
Potassium: 3.6 mmol/L (ref 3.5–5.1)
Sodium: 140 mmol/L (ref 135–145)
TCO2: 18 mmol/L (ref 0–100)

## 2014-11-11 LAB — CBC
HCT: 41.9 % (ref 39.0–52.0)
Hemoglobin: 14.3 g/dL (ref 13.0–17.0)
MCH: 28.7 pg (ref 26.0–34.0)
MCHC: 34.1 g/dL (ref 30.0–36.0)
MCV: 84.1 fL (ref 78.0–100.0)
Platelets: 178 10*3/uL (ref 150–400)
RBC: 4.98 MIL/uL (ref 4.22–5.81)
RDW: 13 % (ref 11.5–15.5)
WBC: 7.5 10*3/uL (ref 4.0–10.5)

## 2014-11-11 LAB — POCT I-STAT 3, ART BLOOD GAS (G3+)
Acid-base deficit: 6 mmol/L — ABNORMAL HIGH (ref 0.0–2.0)
Bicarbonate: 18.3 mEq/L — ABNORMAL LOW (ref 20.0–24.0)
O2 Saturation: 96 %
Patient temperature: 35.1
TCO2: 19 mmol/L (ref 0–100)
pCO2 arterial: 28.2 mmHg — ABNORMAL LOW (ref 35.0–45.0)
pH, Arterial: 7.412 (ref 7.350–7.450)
pO2, Arterial: 76 mmHg — ABNORMAL LOW (ref 80.0–100.0)

## 2014-11-11 LAB — MAGNESIUM: Magnesium: 1.7 mg/dL (ref 1.7–2.4)

## 2014-11-11 LAB — PHOSPHORUS: Phosphorus: 2.5 mg/dL (ref 2.5–4.6)

## 2014-11-11 MED ORDER — DEXMEDETOMIDINE HCL IN NACL 400 MCG/100ML IV SOLN
0.4000 ug/kg/h | INTRAVENOUS | Status: DC
  Administered 2014-11-11: 0.4 ug/kg/h via INTRAVENOUS
  Administered 2014-11-11: 0.2 ug/kg/h via INTRAVENOUS
  Filled 2014-11-11: qty 100
  Filled 2014-11-11: qty 50

## 2014-11-11 MED ORDER — FENTANYL CITRATE (PF) 100 MCG/2ML IJ SOLN
25.0000 ug | INTRAMUSCULAR | Status: DC | PRN
  Administered 2014-11-11: 50 ug via INTRAVENOUS
  Administered 2014-11-12: 25 ug via INTRAVENOUS
  Administered 2014-11-12: 100 ug via INTRAVENOUS
  Administered 2014-11-12: 25 ug via INTRAVENOUS
  Administered 2014-11-12: 50 ug via INTRAVENOUS
  Administered 2014-11-14 (×2): 25 ug via INTRAVENOUS
  Filled 2014-11-11 (×6): qty 2

## 2014-11-11 MED ORDER — POTASSIUM CHLORIDE 10 MEQ/50ML IV SOLN
10.0000 meq | Freq: Once | INTRAVENOUS | Status: AC
  Administered 2014-11-11: 10 meq via INTRAVENOUS

## 2014-11-11 MED ORDER — POTASSIUM CHLORIDE 10 MEQ/50ML IV SOLN
INTRAVENOUS | Status: AC
  Filled 2014-11-11: qty 50

## 2014-11-11 MED FILL — Lidocaine HCl Local Preservative Free (PF) Inj 1%: INTRAMUSCULAR | Qty: 30 | Status: AC

## 2014-11-11 MED FILL — Nitroglycerin IV Soln 100 MCG/ML in D5W: INTRA_ARTERIAL | Qty: 10 | Status: AC

## 2014-11-11 NOTE — Progress Notes (Signed)
PULMONARY / CRITICAL CARE MEDICINE   Name: Brian Noble MRN: [id number] DOB: 1960-12-17    ADMISSION DATE:  11/10/2014 CONSULTATION DATE:  11/10/2014  REFERRING MD :  EDP  CHIEF COMPLAINT:  Witnessed arrest  INITIAL PRESENTATION: 54 year old male who suffered a witnessed cardiac arrest at home. VF. Downtime reportedly less than 20 mins. Combative after ROSC. Intubated in ED and taken to cath lab where he was found to have clean coronary arteries. PCCM to see.   STUDIES:  8/23 LHC > 8/23 CT head > Questionable vague area decreased density in the anterior right temporal lobe. This may reflect a region of acute or subacute ischemia versus volume averaging.  SIGNIFICANT EVENTS: 8/23 witnessed arrest, Cath, to ICU. Hypothermia protocol initiated.   HISTORY OF PRESENT ILLNESS:  54 year old male with PMH of HTN and questionable cardiomyopathy per wife suffered a witnessed cardiac arrest at home 8/23. 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 Afterwards, he complained of dizziness and that his head felt funny. He laid down on the sofa and lost consciousness, rolled onto floor. Family called 911 and initiated CPR. EMS arrived and noted VF or VT. Patient was defibrillated. In ED he was reportedly moaning, but not fully conscious. While in ED he was reportedly combative, and weaker on one side. CT of the head was performed and showed a questionable area of ischemia. He suffered an additional cardiac arrest, this time PEA. Thought to be secondary to hypoxemia. Short downtime recovered with CPR and epinephrine. He was intubated in ED and taken to cath lab where he was found to have clean coronary arteries and a severely reduced LVEF. He was transferred to ICU, PCCM to assume care.    SUBJECTIVE: No events overnight.  VITAL SIGNS: Temp:  [85.3 F (29.6 C)-91.9 F (33.3 C)] 91.8 F (33.2 C) (08/24 0800) Pulse Rate:  [51-65] 65 (08/24 0900) Resp:  [10-29] 25 (08/24 0900) BP: (98-157)/(9-104) 98/62  mmHg (08/24 0800) SpO2:  [98 %-100 %] 98 % (08/24 0900) Arterial Line BP: (97-146)/(64-89) 97/64 mmHg (08/24 0900) FiO2 (%):  [40 %-90 %] 40 % (08/24 0747) HEMODYNAMICS:   VENTILATOR SETTINGS: Vent Mode:  [-] PRVC FiO2 (%):  [40 %-90 %] 40 % Set Rate:  [25 bmp-32 bmp] 25 bmp Vt Set:  [550 mL-670 mL] 550 mL PEEP:  [8 cmH20-14 cmH20] 8 cmH20 Plateau Pressure:  [15 cmH20-29 cmH20] 21 cmH20 INTAKE / OUTPUT:  Intake/Output Summary (Last 24 hours) at 11/11/14 1103 Last data filed at 11/11/14 0800  Gross per 24 hour  Intake 1725.36 ml  Output   3255 ml  Net -1529.64 ml   PHYSICAL EXAMINATION: General:  Middle aged male, overweight, on vent. Sedated and paralyzed. Neuro:  Sedated and paralyzed on vent. HEENT:  [place]/AT, no JVD, PERRL Cardiovascular:  RRR, no MRG Lungs:  Clear bilateral breath sounds Abdomen:  Soft, non-tender, non-distended Musculoskeletal:  No acute deformity or ROM limitation.  Skin:  Grossly intact  LABS:  CBC  Recent Labs Lab 11/10/14 0505  11/10/14 1120 11/10/14 1305 11/11/14 0520  WBC 9.7  --   --  6.3 7.5  HGB 12.3*  < > 12.9* 13.1 14.3  HCT 38.1*  < > 38.0* 38.4* 41.9  PLT 237  --   --  192 178  < > = values in this interval not displayed. Coag's  Recent Labs Lab 11/10/14 0105 11/10/14 0505 11/10/14 1145  APTT 30 30 31   INR 1.27 1.40 1.30  BMET  Recent Labs Lab 11/10/14 2155 11/11/14 0140 11/11/14 0520  NA 135 135 135  K 4.9 4.6 3.4*  CL 109 110 108  CO2 17* 16* 17*  BUN CREATININE 0.83 0.74 0.77  GLUCOSE 118* 105* 95   Electrolytes  Recent Labs Lab 11/10/14 0505  11/10/14 2155 11/11/14 0140 11/11/14 0520  CALCIUM 6.6*  < > 7.6* 7.7* 7.6*  MG 1.6*  --   --   --  1.7  PHOS 3.8  --   --   --  2.5  < > = values in this interval not displayed. Sepsis Markers  Recent Labs Lab 11/10/14 0556 11/10/14 1125  LATICACIDVEN 2.0 1.75   ABG  Recent Labs Lab 11/10/14 0850 11/10/14 1822 11/11/14 0514  PHART 7.395  7.452* 7.480*  PCO2ART 22.6* 19.7* 21.8*  PO2ART 63.0* 116.0* 79.8*   Liver Enzymes  Recent Labs Lab 11/10/14 0105  AST 242*  ALT 314*  ALKPHOS 50  BILITOT 1.0  ALBUMIN 3.1*   Cardiac Enzymes  Recent Labs Lab 11/10/14 0505 11/10/14 1500  TROPONINI 0.13* 0.56*   Glucose  Recent Labs Lab 11/10/14 2027 11/10/14 2158 11/10/14 2331 11/11/14 0139 11/11/14 0331 11/11/14 0816  GLUCAP 96 97 105* 95 85 89    Imaging Dg Chest Port 1 View  11/11/2014   CLINICAL DATA:  Cardiac arrest, acute respiratory failure, intubated patient.  EXAM: PORTABLE CHEST - 1 VIEW  COMPARISON:  Portable chest x-ray of November 10, 2014  FINDINGS: The lungs are adequately inflated. There remain mildly increased interstitial densities in the upper perihilar regions. These are less conspicuous today. The cardiac silhouette is normal in size. The pulmonary vascularity is less engorged. There are external pacing and defibrillator pads present. The endotracheal tube tip lies 5.4 cm above the carina. The esophagogastric tube tip projects below the inferior margin of the image. A new left internal jugular venous catheter tip projects over the midportion of the SVC  IMPRESSION: Interval improvement in the appearance of the pulmonary interstitium consistent with resolving interstitial edema. There is persistent subsegmental left lower lobe atelectasis. The support tubes are in reasonable position.   Electronically Signed   By: David  [place] M.D.   On: 11/11/2014 07:10   Dg Chest Port 1 View  11/10/2014   CLINICAL DATA:  Clotted central line, initial encounter  EXAM: PORTABLE CHEST - 1 VIEW  COMPARISON:  Portable exam 1320 hours compared to 0429 hours  FINDINGS: Tip of endotracheal tube projects 4.9 cm above carina.  Nasogastric tube extends into stomach.  LEFT jugular central venous catheter tip projects over SVC.  External pacing leads in EKG leads project over chest.  Upper normal size of cardiac silhouette.   Persistent perihilar infiltrates greatest in RIGHT upper lobe and LEFT lower lobe, question edema versus infection.  No pleural effusion or pneumothorax.  IMPRESSION: Persistent pulmonary infiltrates.  Stable line and tube positions.  Little interval change.   Electronically Signed   By: Ulyses Southward M.D.   On: 11/10/2014 13:28     ASSESSMENT / PLAN:  PULMONARY OETT 8/23 >>> A: Acute hypoxemic respiratory failure in setting cardiac arrest ?Aspiration pneumonitis  P:   Full vent support Decrease PEEP to 5. Decrease RR to 20. F/U ABG at 1 PM. Follow CXR, ABG in AM. VAP bundle. Concern for aspiration, on unasyn.  CARDIOVASCULAR CVL R fem 8/23 >8/23 L IJ TLC 8/23>>> A:  Cardiogenic shock S/p VF arrest Acute systolic CHF H/o HTN  P:  Telemetry monitoring MAP goal > 62mm/Hg Levophed to achieve MAP goal currently at 7 Gentle volume Therapeutic hypothermia protocol 33 degrees Appreciate cardiology assistance   RENAL A:   AKI Hypokalemia Hypocalcemia  P:   Serial BMET IVF as ordered Replace electrolytes as indicated.   GASTROINTESTINAL A:   No acute issues  P:   NPO for now PPI  HEMATOLOGIC A:   Anemia  P:  Serial CBC Transfuse per ICU guidelines Heparin SQ  INFECTIOUS A:   Concern aspiration  P:   BCx2 8/23 >>> Sputum 8/23 >>>  Unasyn 8/23 >>>  Trend WBC and fever curve  ENDOCRINE A:   Hyperglycemia without history of DM  P:   Insulin gtt CBG monitoring  NEUROLOGIC A:   Acute anoxic encephalopathy ?Area of ischemia/CVA  P:   RASS goal: -5. Versed/fentanyl infusions. Nimbex for hypothermia protocol. WUA when meets criteria.  Consult neurology appreciated.  FAMILY  - Updates: No family bedside.  - Inter-disciplinary family meet or Palliative Care meeting due by:  9/1  The patient is critically ill with multiple organ systems failure and requires high complexity decision making for assessment and support, frequent evaluation  and titration of therapies, application of advanced monitoring technologies and extensive interpretation of multiple databases.   Critical Care Time devoted to patient care services described in this note is  35  Minutes. This time reflects time of care of this signee Dr Koren Bound. This critical care time does not reflect procedure time, or teaching time or supervisory time of PA/NP/Med student/Med Resident etc but could involve care discussion time.  Alyson Reedy, M.D. [facility] Pulmonary/Critical Care Medicine. Pager: [phone]. After hours pager: [phone].

## 2014-11-11 NOTE — Progress Notes (Signed)
eLink Physician-Brief Progress Note Patient Name: Brian Noble DOB: [birth year] MRN: [id number]   Date of Service  11/11/2014  HPI/Events of Note  K+ = 3.4 and Creatinine = 0.77. Patient starting rewarming phase.  eICU Interventions  Will cautiously replete K+.     Intervention Category Intermediate Interventions: Electrolyte abnormality - evaluation and management  Lyall Faciane Eugene 11/11/2014, 6:26 AM

## 2014-11-11 NOTE — Progress Notes (Signed)
Patient Name: Brian Noble Date of Encounter: 11/11/2014  Active Problems:   Sudden cardiac death   Cardiac arrest   Acute respiratory failure with hypoxia   Length of Stay: 1  SUBJECTIVE  Sedated, paralyzed, intubated.  CURRENT MEDS . ampicillin-sulbactam (UNASYN) IV  3 g Intravenous 3 times per day  . antiseptic oral rinse  7 mL Mouth Rinse 10 times per day  . artificial tears  1 application Both Eyes 3 times per day  . aspirin  81 mg Oral Daily  . chlorhexidine gluconate  15 mL Mouth Rinse BID  . famotidine (PEPCID) IV  20 mg Intravenous Q12H  . fentaNYL (SUBLIMAZE) injection  100 mcg Intravenous Once  . heparin  5,000 Units Subcutaneous 3 times per day  . midazolam  2 mg Intravenous Once  . sodium chloride  10-40 mL Intracatheter Q12H  . sodium chloride  3 mL Intravenous Q12H    OBJECTIVE   Intake/Output Summary (Last 24 hours) at 11/11/14 1057 Last data filed at 11/11/14 0800  Gross per 24 hour  Intake 1779.16 ml  Output   3515 ml  Net -1735.84 ml   Filed Weights   11/10/14 0057 11/10/14 0350  Weight: 88.451 kg (195 lb) 84.1 kg (185 lb 6.5 oz)    PHYSICAL EXAM Filed Vitals:   11/11/14 0700 11/11/14 0747 11/11/14 0800 11/11/14 0900  BP:  100/67 98/62   Pulse: 51 60 59 65  Temp:  91.6 F (33.1 C) 91.8 F (33.2 C)   TempSrc:  Core (Comment) Core (Comment)   Resp: 25 15 25 25   Height:      Weight:      SpO2: 100% 100% 100% 98%   General: Alert, oriented x3, no distress Head: no evidence of trauma, PERRL, EOMI, no exophtalmos or lid lag, no myxedema, no xanthelasma; normal ears, nose and oropharynx Neck: normal jugular venous pulsations and no hepatojugular reflux; brisk carotid pulses without delay and no carotid bruits Chest: clear to auscultation, no signs of consolidation by percussion or palpation, normal fremitus, symmetrical and full respiratory excursions Cardiovascular: normal position and quality of the apical impulse, regular rhythm, normal  first and second heart sounds, no rubs or gallops, no murmur Abdomen: no tenderness or distention, no masses by palpation, no abnormal pulsatility or arterial bruits, normal bowel sounds, no hepatosplenomegaly Extremities: no clubbing, cyanosis or edema; 2+ radial, ulnar and brachial pulses bilaterally; 2+ right femoral, posterior tibial and dorsalis pedis pulses; 2+ left femoral, posterior tibial and dorsalis pedis pulses; no subclavian or femoral bruits Neurological: grossly nonfocal  LABS  CBC  Recent Labs  11/10/14 0105  11/10/14 1305 11/11/14 0520  WBC 10.7*  < > 6.3 7.5  NEUTROABS 5.7  --   --   --   HGB 11.9*  < > 13.1 14.3  HCT 37.5*  < > 38.4* 41.9  MCV 91.2  < > 86.3 84.1  PLT 218  < > 192 178  < > = values in this interval not displayed. Basic Metabolic Panel  Recent Labs  11/10/14 0505  11/11/14 0140 11/11/14 0520  NA 139  < > 135 135  K 3.8  < > 4.6 3.4*  CL 114*  < > 110 108  CO2 21*  < > 16* 17*  GLUCOSE 149*  < > 105* 95  BUN 16  < > 8 7  CREATININE 1.51*  < > 0.74 0.77  CALCIUM 6.6*  < > 7.7* 7.6*  MG 1.6*  --   --  1.7  PHOS 3.8  --   --  2.5  < > = values in this interval not displayed. Liver Function Tests  Recent Labs  11/10/14 0105  AST 242*  ALT 314*  ALKPHOS 50  BILITOT 1.0  PROT 5.5*  ALBUMIN 3.1*   No results for input(s): LIPASE, AMYLASE in the last 72 hours. Cardiac Enzymes  Recent Labs  11/10/14 0505 11/10/14 1500  TROPONINI 0.13* 0.56*    Radiology Studies Imaging results have been reviewed and Ct Head Wo Contrast  11/10/2014   CLINICAL DATA:  Left-sided weakness.  Post cardiac arrest and CPR.  EXAM: CT HEAD WITHOUT CONTRAST  TECHNIQUE: Contiguous axial images were obtained from the base of the skull through the vertex without intravenous contrast.  COMPARISON:  None.  FINDINGS: Questionable vague area decreased density in the anterior right temporal lobe without mass effect. No intracranial hemorrhage or midline shift. No  hydrocephalus. The basilar cisterns are patent. No cerebral edema. No intracranial fluid collection. Calvarium is intact. Minimal opacification of ethmoid air cells and small fluid levels in the sphenoid sinus, may be related to intubation. The mastoid air cells are well aerated.  IMPRESSION: Questionable vague area decreased density in the anterior right temporal lobe. This may reflect a region of acute or subacute ischemia versus volume averaging. There is no hemorrhage or mass effect.  These results were called by telephone at the time of interpretation on 11/10/2014 at 2:56 am to Dr. Loren Racer , who verbally acknowledged these results.   Electronically Signed   By: Rubye Oaks M.D.   On: 11/10/2014 02:56   Dg Chest Port 1 View  11/11/2014   CLINICAL DATA:  Cardiac arrest, acute respiratory failure, intubated patient.  EXAM: PORTABLE CHEST - 1 VIEW  COMPARISON:  Portable chest x-ray of November 10, 2014  FINDINGS: The lungs are adequately inflated. There remain mildly increased interstitial densities in the upper perihilar regions. These are less conspicuous today. The cardiac silhouette is normal in size. The pulmonary vascularity is less engorged. There are external pacing and defibrillator pads present. The endotracheal tube tip lies 5.4 cm above the carina. The esophagogastric tube tip projects below the inferior margin of the image. A new left internal jugular venous catheter tip projects over the midportion of the SVC  IMPRESSION: Interval improvement in the appearance of the pulmonary interstitium consistent with resolving interstitial edema. There is persistent subsegmental left lower lobe atelectasis. The support tubes are in reasonable position.   Electronically Signed   By: David  [place] M.D.   On: 11/11/2014 07:10   Dg Chest Port 1 View  11/10/2014   CLINICAL DATA:  Clotted central line, initial encounter  EXAM: PORTABLE CHEST - 1 VIEW  COMPARISON:  Portable exam 1320 hours compared to  0429 hours  FINDINGS: Tip of endotracheal tube projects 4.9 cm above carina.  Nasogastric tube extends into stomach.  LEFT jugular central venous catheter tip projects over SVC.  External pacing leads in EKG leads project over chest.  Upper normal size of cardiac silhouette.  Persistent perihilar infiltrates greatest in RIGHT upper lobe and LEFT lower lobe, question edema versus infection.  No pleural effusion or pneumothorax.  IMPRESSION: Persistent pulmonary infiltrates.  Stable line and tube positions.  Little interval change.   Electronically Signed   By: Ulyses Southward M.D.   On: 11/10/2014 13:28   Dg Chest Port 1 View  11/10/2014   CLINICAL DATA:  Hypoxia.  EXAM: PORTABLE CHEST - 1 VIEW  COMPARISON:  Earlier today at 0047 hour  FINDINGS: 0429 hour: Endotracheal tube is approximately 4.0 cm from the carina. Enteric tube in place, tip and side port below the diaphragm not included in the field of view.  Progressive opacity in the right upper lobe, now abutting the minor fissure. Development of retrocardiac opacity and obscuration of the hemidiaphragm. Increasing hazy opacity in the left midlung zone, partially obscured. Cardiomediastinal contours are unchanged, heart is mildly enlarged. Multiple overlying monitoring devices remain in place.  IMPRESSION: 1. Development of retrocardiac opacity and increasing hazy opacity in the left mid lung zone. This may reflect atelectasis or developing pulmonary edema. 2. Increasing right upper lobe opacity, and now all abutting the minor fissure. Pulmonary edema versus atelectasis again considered.   Electronically Signed   By: Rubye Oaks M.D.   On: 11/10/2014 04:34   Dg Chest Portable 1 View  11/10/2014   CLINICAL DATA:  Intubation.  Post CPR.  EXAM: PORTABLE CHEST - 1 VIEW  COMPARISON:  None.  FINDINGS: Endotracheal tube approximately 2.6 cm from the carina. Enteric tube in place, tip and side-port below the diaphragm not included in the field of view. Multiple  overlying monitoring devices partially obscuring evaluation. Heart appears mildly enlarged. Lung volumes are low. Ill-defined hazy opacity in the right upper lung zone. Mild vascular congestion. No displaced rib fracture or large pneumothorax. No large pleural effusion.  IMPRESSION: 1. Endotracheal tube 2.6 cm from the carina.  Enteric tube in place. 2. Probable cardiomegaly. Ill-defined opacity in the right upper lung zone, may reflect asymmetric pulmonary edema, pneumonia or contusion given history of CPR. Multiple overlying monitoring devices partially obscure evaluation of the lung parenchyma.   Electronically Signed   By: Rubye Oaks M.D.   On: 11/10/2014 01:05    TELE NSR, 1st deg AVB, long QT  ECG NSR, 1st deg AVB, long QT  ASSESSMENT AND PLAN  Young man with history of nonischemic cardiomyopathy and EF 20% and VT/VF arrest in the setting of severe hypokalemia. Relatively rapid resuscitation, now rewarming after cooling protocol. If he has good neurological recovery, plan EP evaluation for AICD implantation (even if low K was trigger for arrhythmia, he still meets criteria for ICD by SCD-HeFT).   Thurmon Fair, MD, [facility] [phone] office [phone] pager 11/11/2014 10:57 AM

## 2014-11-11 NOTE — Progress Notes (Addendum)
eLink Physician-Brief Progress Note Patient Name: Brian Noble DOB: 09/26/1960 MRN: [id number]   Date of Service  11/11/2014  HPI/Events of Note  Pt cominfg around after rewarming. RN reports +/- F/C. Dyssynchronous with vent and appears uncomfortable  eICU Interventions  Dexmedetomidine PRN fentanyl Vent to PCV mode for better synchrony     Intervention Category Major Interventions: Change in mental status - evaluation and management  Billy Fischer 11/11/2014, 8:37 PM

## 2014-11-12 ENCOUNTER — Inpatient Hospital Stay (HOSPITAL_COMMUNITY): Payer: BLUE CROSS/BLUE SHIELD

## 2014-11-12 DIAGNOSIS — J9601 Acute respiratory failure with hypoxia: Secondary | ICD-10-CM

## 2014-11-12 DIAGNOSIS — R570 Cardiogenic shock: Secondary | ICD-10-CM

## 2014-11-12 DIAGNOSIS — R0902 Hypoxemia: Secondary | ICD-10-CM

## 2014-11-12 LAB — GLUCOSE, CAPILLARY
Glucose-Capillary: 143 mg/dL — ABNORMAL HIGH (ref 65–99)
Glucose-Capillary: 56 mg/dL — ABNORMAL LOW (ref 65–99)
Glucose-Capillary: 61 mg/dL — ABNORMAL LOW (ref 65–99)
Glucose-Capillary: 65 mg/dL (ref 65–99)
Glucose-Capillary: 69 mg/dL (ref 65–99)
Glucose-Capillary: 69 mg/dL (ref 65–99)
Glucose-Capillary: 71 mg/dL (ref 65–99)
Glucose-Capillary: 76 mg/dL (ref 65–99)
Glucose-Capillary: 82 mg/dL (ref 65–99)
Glucose-Capillary: 88 mg/dL (ref 65–99)

## 2014-11-12 LAB — BASIC METABOLIC PANEL
Anion gap: 8 (ref 5–15)
BUN: 13 mg/dL (ref 6–20)
CO2: 21 mmol/L — ABNORMAL LOW (ref 22–32)
Calcium: 7.9 mg/dL — ABNORMAL LOW (ref 8.9–10.3)
Chloride: 110 mmol/L (ref 101–111)
Creatinine, Ser: 1.12 mg/dL (ref 0.61–1.24)
GFR calc Af Amer: >60 mL/min (ref >60)
GFR calc non Af Amer: >60 mL/min (ref >60)
Glucose, Bld: 86 mg/dL (ref 65–99)
Potassium: 3.6 mmol/L (ref 3.5–5.1)
Sodium: 139 mmol/L (ref 135–145)

## 2014-11-12 LAB — BLOOD GAS, ARTERIAL
Acid-base deficit: 2.5 mmol/L — ABNORMAL HIGH (ref 0.0–2.0)
Bicarbonate: 20.7 mEq/L (ref 20.0–24.0)
Drawn by: 398981
FIO2: 0.3
MECHVT: 550 mL
O2 Saturation: 97.5 %
PEEP: 5 cmH2O
Patient temperature: 98.6
RATE: 20 resp/min
TCO2: 21.6 mmol/L (ref 0–100)
pCO2 arterial: 29.5 mmHg — ABNORMAL LOW (ref 35.0–45.0)
pH, Arterial: 7.46 — ABNORMAL HIGH (ref 7.350–7.450)
pO2, Arterial: 85.8 mmHg (ref 80.0–100.0)

## 2014-11-12 LAB — CULTURE, RESPIRATORY W GRAM STAIN

## 2014-11-12 LAB — CBC
HCT: 38.4 % — ABNORMAL LOW (ref 39.0–52.0)
Hemoglobin: 13 g/dL (ref 13.0–17.0)
MCH: 29 pg (ref 26.0–34.0)
MCHC: 33.9 g/dL (ref 30.0–36.0)
MCV: 85.7 fL (ref 78.0–100.0)
Platelets: 179 10*3/uL (ref 150–400)
RBC: 4.48 MIL/uL (ref 4.22–5.81)
RDW: 13.3 % (ref 11.5–15.5)
WBC: 9.3 10*3/uL (ref 4.0–10.5)

## 2014-11-12 LAB — POCT I-STAT 3, ART BLOOD GAS (G3+)
Acid-base deficit: 4 mmol/L — ABNORMAL HIGH (ref 0.0–2.0)
Bicarbonate: 19.2 mEq/L — ABNORMAL LOW (ref 20.0–24.0)
O2 Saturation: 96 %
Patient temperature: 98.6
TCO2: 20 mmol/L (ref 0–100)
pCO2 arterial: 29.4 mmHg — ABNORMAL LOW (ref 35.0–45.0)
pH, Arterial: 7.422 (ref 7.350–7.450)
pO2, Arterial: 81 mmHg (ref 80.0–100.0)

## 2014-11-12 LAB — PHOSPHORUS: Phosphorus: 3 mg/dL (ref 2.5–4.6)

## 2014-11-12 LAB — TRIGLYCERIDES: Triglycerides: 165 mg/dL — ABNORMAL HIGH (ref <150)

## 2014-11-12 LAB — MAGNESIUM: Magnesium: 1.7 mg/dL (ref 1.7–2.4)

## 2014-11-12 MED ORDER — VITAL AF 1.2 CAL PO LIQD
1000.0000 mL | ORAL | Status: DC
  Administered 2014-11-12: 1000 mL
  Filled 2014-11-12 (×5): qty 1000

## 2014-11-12 MED ORDER — POTASSIUM CHLORIDE 20 MEQ/15ML (10%) PO SOLN
20.0000 meq | Freq: Every day | ORAL | Status: DC
  Administered 2014-11-12 – 2014-11-15 (×3): 20 meq via ORAL
  Filled 2014-11-12 (×4): qty 15

## 2014-11-12 MED ORDER — DEXTROSE 50 % IV SOLN
INTRAVENOUS | Status: AC
  Filled 2014-11-12: qty 50

## 2014-11-12 MED ORDER — DEXTROSE 50 % IV SOLN
1.0000 | Freq: Once | INTRAVENOUS | Status: AC
  Administered 2014-11-12: 25 mL via INTRAVENOUS

## 2014-11-12 MED ORDER — LISINOPRIL 20 MG PO TABS: 20.0000 mg | ORAL_TABLET | Freq: Every day | ORAL | Status: DC

## 2014-11-12 MED ORDER — PROPOFOL 1000 MG/100ML IV EMUL
0.0000 ug/kg/min | INTRAVENOUS | Status: DC
  Administered 2014-11-12: 40 ug/kg/min via INTRAVENOUS
  Administered 2014-11-12: 5 ug/kg/min via INTRAVENOUS
  Administered 2014-11-12 – 2014-11-13 (×2): 50 ug/kg/min via INTRAVENOUS
  Administered 2014-11-13: 45 ug/kg/min via INTRAVENOUS
  Filled 2014-11-12 (×5): qty 100

## 2014-11-12 MED ORDER — MAGNESIUM SULFATE 2 GM/50ML IV SOLN
2.0000 g | Freq: Once | INTRAVENOUS | Status: AC
  Administered 2014-11-12: 2 g via INTRAVENOUS
  Filled 2014-11-12: qty 50

## 2014-11-12 MED ORDER — FUROSEMIDE 10 MG/ML IJ SOLN
40.0000 mg | Freq: Once | INTRAMUSCULAR | Status: AC
  Administered 2014-11-12: 40 mg via INTRAVENOUS
  Filled 2014-11-12: qty 4

## 2014-11-12 MED ORDER — DEXTROSE 50 % IV SOLN
25.0000 mL | Freq: Once | INTRAVENOUS | Status: AC
  Administered 2014-11-12: 25 mL via INTRAVENOUS

## 2014-11-12 MED ORDER — DEXTROSE 10 % IV SOLN
INTRAVENOUS | Status: DC
  Administered 2014-11-12 – 2014-11-15 (×3): via INTRAVENOUS

## 2014-11-12 MED ORDER — MAGNESIUM SULFATE 50 % IJ SOLN
2.0000 g | Freq: Once | INTRAVENOUS | Status: DC
  Filled 2014-11-12: qty 4

## 2014-11-12 MED ORDER — POTASSIUM CHLORIDE 20 MEQ/15ML (10%) PO SOLN
40.0000 meq | Freq: Once | ORAL | Status: AC
  Administered 2014-11-12: 40 meq
  Filled 2014-11-12: qty 30

## 2014-11-12 MED ORDER — SODIUM CHLORIDE 0.9 % IV SOLN
INTRAVENOUS | Status: DC
  Administered 2014-11-12 – 2014-11-16 (×3): via INTRAVENOUS

## 2014-11-12 MED ORDER — VITAL HIGH PROTEIN PO LIQD
1000.0000 mL | ORAL | Status: DC
  Filled 2014-11-12 (×2): qty 1000

## 2014-11-12 MED ORDER — LISINOPRIL 5 MG PO TABS
5.0000 mg | ORAL_TABLET | Freq: Every day | ORAL | Status: DC
  Administered 2014-11-12: 5 mg via ORAL
  Filled 2014-11-12: qty 1

## 2014-11-12 NOTE — Progress Notes (Signed)
Hypoglycemic Event  CBG: 53  Treatment: D50 IV 25 mL  Symptoms: None  Follow-up CBG: Time: 1700 CBG Result: 71  Possible Reasons for Event: Unknown  Comments/MD notified: E-link MD notified    Brian Noble  Remember to initiate Hypoglycemia Order Set & complete

## 2014-11-12 NOTE — Progress Notes (Signed)
Patient Name: Brian Noble Date of Encounter: 11/12/2014  Active Problems:   Sudden cardiac death   Cardiac arrest   Acute respiratory failure with hypoxia   Hypoxia   Cardiogenic shock   Length of Stay: 2  SUBJECTIVE  Making good progress. Waking up, following commands, a little agitated and relatively high minute ventilation.  CURRENT MEDS . ampicillin-sulbactam (UNASYN) IV  3 g Intravenous 3 times per day  . antiseptic oral rinse  7 mL Mouth Rinse 10 times per day  . aspirin  81 mg Oral Daily  . chlorhexidine gluconate  15 mL Mouth Rinse BID  . famotidine (PEPCID) IV  20 mg Intravenous Q12H  . fentaNYL (SUBLIMAZE) injection  100 mcg Intravenous Once  . heparin  5,000 Units Subcutaneous 3 times per day  . midazolam  2 mg Intravenous Once  . sodium chloride  10-40 mL Intracatheter Q12H  . sodium chloride  3 mL Intravenous Q12H    OBJECTIVE   Intake/Output Summary (Last 24 hours) at 11/12/14 0835 Last data filed at 11/12/14 0800  Gross per 24 hour  Intake 961.03 ml  Output   1045 ml  Net -83.97 ml   Filed Weights   11/10/14 0057 11/10/14 0350  Weight: 88.451 kg (195 lb) 84.1 kg (185 lb 6.5 oz)    PHYSICAL EXAM Filed Vitals:   11/12/14 0600 11/12/14 0739 11/12/14 0800 11/12/14 0815  BP:  99/55    Pulse: 91 89 98 95  Temp: 99.3 F (37.4 C) 98.4 F (36.9 C) 98.6 F (37 C)   TempSrc: Core (Comment) Core (Comment) Core (Comment)   Resp: 17 21 21 21   Height:      Weight:      SpO2: 100% 96% 93% 97%   General: Intubated, slightly agitated, follows at least some commands Head: no evidence of trauma, PERRL, EOMI, no exophtalmos or lid lag, no myxedema, no xanthelasma; normal ears, nose and oropharynx Neck: normal jugular venous pulsations and no hepatojugular reflux; brisk carotid pulses without delay and no carotid bruits Chest: clear to auscultation, no signs of consolidation by percussion or palpation, normal fremitus, symmetrical and full respiratory  excursions Cardiovascular: normal position and quality of the apical impulse, regular rhythm, normal first and second heart sounds, no rubs or gallops, no murmur Abdomen: no tenderness or distention, no masses by palpation, no abnormal pulsatility or arterial bruits, normal bowel sounds, no hepatosplenomegaly Extremities: no clubbing, cyanosis or edema; 2+ radial, ulnar and brachial pulses bilaterally; 2+ right femoral, posterior tibial and dorsalis pedis pulses; 2+ left femoral, posterior tibial and dorsalis pedis pulses; no subclavian or femoral bruits Neurological: grossly nonfocal, limited exam  LABS  CBC  Recent Labs  11/10/14 0105  11/11/14 0520 11/11/14 1143 11/12/14 0219  WBC 10.7*  < > 7.5  --  9.3  NEUTROABS 5.7  --   --   --   --   HGB 11.9*  < > 14.3 14.6 13.0  HCT 37.5*  < > 41.9 43.0 38.4*  MCV 91.2  < > 84.1  --  85.7  PLT 218  < > 178  --  179  < > = values in this interval not displayed. Basic Metabolic Panel  Recent Labs  11/11/14 0520 11/11/14 1143 11/12/14 0219  NA 135 140 139  K 3.4* 3.6 3.6  CL 108 109 110  CO2 17*  --  21*  GLUCOSE 95 101* 86  BUN 7 11 13   CREATININE 0.77 0.70 1.12  CALCIUM  7.6*  --  7.9*  MG 1.7  --  1.7  PHOS 2.5  --  3.0   Liver Function Tests  Recent Labs  11/10/14 0105  AST 242*  ALT 314*  ALKPHOS 50  BILITOT 1.0  PROT 5.5*  ALBUMIN 3.1*   No results for input(s): LIPASE, AMYLASE in the last 72 hours. Cardiac Enzymes  Recent Labs  11/10/14 0505 11/10/14 1500  TROPONINI 0.13* 0.56*   BNPRadiology Studies Imaging results have been reviewed and Dg Chest Port 1 View  11/12/2014   CLINICAL DATA:  Respiratory failure, cardiac arrest, intubated patient.  EXAM: PORTABLE CHEST - 1 VIEW  COMPARISON:  Portable chest x-ray of November 11, 2014  FINDINGS: The lungs are reasonably well inflated. The right hemidiaphragm remains higher than the left. The retrocardiac region on the left remains dense. Interstitial density in  the right upper lobe is less conspicuous today. The cardiac silhouette is top-normal in size. The central pulmonary vascularity is less engorged.  The endotracheal tube tip lies 4 cm above the carina. The esophagogastric tube tip projects below the inferior margin of the image. The left internal jugular venous catheter tip projects over the junction of the proximal and midportions of the SVC.  IMPRESSION: Slight interval improvement in interstitial edema. Left lower lobe atelectasis persists. The support tubes are in reasonable position.   Electronically Signed   By: David  [place] M.D.   On: 11/12/2014 07:13   Dg Chest Port 1 View  11/11/2014   CLINICAL DATA:  Cardiac arrest, acute respiratory failure, intubated patient.  EXAM: PORTABLE CHEST - 1 VIEW  COMPARISON:  Portable chest x-ray of November 10, 2014  FINDINGS: The lungs are adequately inflated. There remain mildly increased interstitial densities in the upper perihilar regions. These are less conspicuous today. The cardiac silhouette is normal in size. The pulmonary vascularity is less engorged. There are external pacing and defibrillator pads present. The endotracheal tube tip lies 5.4 cm above the carina. The esophagogastric tube tip projects below the inferior margin of the image. A new left internal jugular venous catheter tip projects over the midportion of the SVC  IMPRESSION: Interval improvement in the appearance of the pulmonary interstitium consistent with resolving interstitial edema. There is persistent subsegmental left lower lobe atelectasis. The support tubes are in reasonable position.   Electronically Signed   By: David  [place] M.D.   On: 11/11/2014 07:10   Dg Chest Port 1 View  11/10/2014   CLINICAL DATA:  Clotted central line, initial encounter  EXAM: PORTABLE CHEST - 1 VIEW  COMPARISON:  Portable exam 1320 hours compared to 0429 hours  FINDINGS: Tip of endotracheal tube projects 4.9 cm above carina.  Nasogastric tube extends into  stomach.  LEFT jugular central venous catheter tip projects over SVC.  External pacing leads in EKG leads project over chest.  Upper normal size of cardiac silhouette.  Persistent perihilar infiltrates greatest in RIGHT upper lobe and LEFT lower lobe, question edema versus infection.  No pleural effusion or pneumothorax.  IMPRESSION: Persistent pulmonary infiltrates.  Stable line and tube positions.  Little interval change.   Electronically Signed   By: Ulyses Southward M.D.   On: 11/10/2014 13:28    TELE NSR  ECG NSR, first deg AV block, LBBB  ASSESSMENT AND PLAN  1. S/P cardiac arrest - triggered by severe hypokalemia on background of longstanding (6-7 years) of cardiomyopathy. If he does indeed have a good neurological recovery, he meets criteria for secondary  prevention ICD (AVID), before hospital DC (CRT-D). 2. Severe nonischemic cardiomyopathy - per family report had excellent functional status pre-arrest (caring for livestock, preaching, "always moving fast"). LBBB raises opportunity for CRT nonetheless. Likely good response with nonischemic cardiomyopathy. 3. Acute on chronic HF - by CXR appears volume overloaded. Give diuretic, Restart ACEi. Resume carvedilol once euvolemic. 4. Hypokalemia - K borderline low; probably still overall K depleted (was on HCTZ 50 mg daily, admission K 2.6)    Thurmon Fair, MD, [facility] [phone] office [phone] pager 11/12/2014 8:35 AM

## 2014-11-12 NOTE — Progress Notes (Addendum)
Nutrition Follow-up  DOCUMENTATION CODES:   Not applicable  INTERVENTION:  Initiate Vital AF 1.2 @ 20 ml/hr via OGT and increase by 10 ml every 4 hours to goal rate of 65 ml/hr.   Tube feeding regimen provides 1872 kcal (100% of needs), 117 grams of protein, and 1265 ml of H2O.   TF regimen with current propofol rate to provide 1938 kcals.   NUTRITION DIAGNOSIS:   Inadequate oral intake related to inability to eat as evidenced by NPO status.  Ongoing  GOAL:   Patient will meet greater than or equal to 90% of their needs  Unmet  MONITOR:   Vent status, Labs, Weight trends, Skin, I & O's  REASON FOR ASSESSMENT:   Consult Enteral/tube feeding initiation and management  ASSESSMENT:   54 year old male who suffered a witnessed cardiac arrest at home. VF. Downtime reportedly less than 20 mins. Combative after ROSC. Intubated in ED and taken to cath lab where he was found to have clean coronary arteries. PCCM to see.   Patient remains intubated on ventilator support. OGT in place. MV: 7.9 L/min Temp (24hrs), Avg:96.4 F (35.8 C), Min:93.2 F (34 C), Max:99.3 F (37.4 C)  Propofol: 2.5 ml/hr (66 kcals)  Nimbex d/c on 11/12/14. Pt remains with rectal tube d/t watery diarrhea.  Spoke with RN, who confirmed pt has been re-warmed.   Nutrition-Focused physical exam completed. Findings are no fat depletion, no muscle depletion, and no edema.   Labs reviewed.   Diet Order:   NPO  Skin:  Reviewed, no issues  Last BM:  11/12/14  Height:   Ht Readings from Last 1 Encounters:  11/10/14 5\' 8"  (1.727 m)    Weight:   Wt Readings from Last 1 Encounters:  11/10/14 185 lb 6.5 oz (84.1 kg)    Ideal Body Weight:  70 kg  BMI:  Body mass index is 28.2 kg/(m^2).  Estimated Nutritional Needs:   Kcal:  2028.8  Protein:  110-125 grams  Fluid:  >1.8 L  EDUCATION NEEDS:   No education needs identified at this time  Monterius Rolf A. Mayford Knife, RD, LDN, CDE Pager:  [phone] After hours Pager: [phone]

## 2014-11-12 NOTE — Progress Notes (Signed)
Hypoglycemic Event  CBG: 65  Treatment: D50 IV 25 mL  Symptoms: Nervous/irritable  Follow-up CBG: Time: 1235 CBG Result:82   Possible Reasons for Event: Unknown  Comments/MD notified: Dr. Tyson Alias notified    Brian Noble, Brian Noble  Remember to initiate Hypoglycemia Order Set & complete

## 2014-11-12 NOTE — Progress Notes (Signed)
PULMONARY / CRITICAL CARE MEDICINE   Name: CARLO GUEVARRA MRN: [id number] DOB: 05-08-1960    ADMISSION DATE:  11/10/2014 CONSULTATION DATE:  11/10/2014  REFERRING MD :  EDP  CHIEF COMPLAINT:  Witnessed arrest  INITIAL PRESENTATION: 54 year old male who suffered a witnessed cardiac arrest at home. VF. Downtime reportedly less than 20 mins. Combative after ROSC. Intubated in ED and taken to cath lab where he was found to have clean coronary arteries. PCCM to see.   STUDIES:  8/23 LHC > 8/23 CT head > Questionable vague area decreased density in the anterior right temporal lobe. This may reflect a region of acute or subacute ischemia versus volume averaging.  SIGNIFICANT EVENTS: 8/23 witnessed arrest, Cath, to ICU. Hypothermia protocol initiated.  SUBJECTIVE: pcxr clearing, awake followed commands  VITAL SIGNS: Temp:  [92.7 F (33.7 C)-99.3 F (37.4 C)] 98.6 F (37 C) (08/25 0800) Pulse Rate:  [58-108] 95 (08/25 0815) Resp:  [0-25] 21 (08/25 0815) BP: (91-130)/(55-83) 99/55 mmHg (08/25 0739) SpO2:  [93 %-100 %] 97 % (08/25 0815) Arterial Line BP: (94-182)/(53-83) 182/83 mmHg (08/25 0815) FiO2 (%):  [30 %-40 %] 30 % (08/25 0739) HEMODYNAMICS:   VENTILATOR SETTINGS: Vent Mode:  [-] PSV;CPAP FiO2 (%):  [30 %-40 %] 30 % Set Rate:  [15 bmp-25 bmp] 15 bmp Vt Set:  [550 mL] 550 mL PEEP:  [5 cmH20-8 cmH20] 5 cmH20 Pressure Support:  [5 cmH20] 5 cmH20 Plateau Pressure:  [16 cmH20-21 cmH20] 18 cmH20 INTAKE / OUTPUT:  Intake/Output Summary (Last 24 hours) at 11/12/14 0859 Last data filed at 11/12/14 0800  Gross per 24 hour  Intake 961.03 ml  Output   1045 ml  Net -83.97 ml   PHYSICAL EXAMINATION: General:  Middle aged male, overweight, on vent. Sedated rass 0 Neuro:  Sedated rass 0, moves all ext equally HEENT:  [place]/AT, PERRL Cardiovascular:  RRR, no MRG Lungs: improved , coarse Abdomen:  Soft, non-tender, non-distended Musculoskeletal:  No acute deformity or ROM  limitation.  Skin:  Grossly intact  LABS:  CBC  Recent Labs Lab 11/10/14 1305 11/11/14 0520 11/11/14 1143 11/12/14 0219  WBC 6.3 7.5  --  9.3  HGB 13.1 14.3 14.6 13.0  HCT 38.4* 41.9 43.0 38.4*  PLT 192 178  --  179   Coag's  Recent Labs Lab 11/10/14 0105 11/10/14 0505 11/10/14 1145  APTT INR 1.27 1.40 1.30   BMET  Recent Labs Lab 11/11/14 0140 11/11/14 0520 11/11/14 1143 11/12/14 0219  NA 135 135 140 139  K 4.6 3.4* 3.6 3.6  CL 110 108 109 110  CO2 16* 17*  --  21*  BUN CREATININE 0.74 0.77 0.70 1.12  GLUCOSE 105* 95 101* 86   Electrolytes  Recent Labs Lab 11/10/14 0505  11/11/14 0140 11/11/14 0520 11/12/14 0219  CALCIUM 6.6*  < > 7.7* 7.6* 7.9*  MG 1.6*  --   --  1.7 1.7  PHOS 3.8  --   --  2.5 3.0  < > = values in this interval not displayed. Sepsis Markers  Recent Labs Lab 11/10/14 0556 11/10/14 1125  LATICACIDVEN 2.0 1.75   ABG  Recent Labs Lab 11/11/14 1330 11/12/14 0337 11/12/14 0439  PHART 7.412 7.460* 7.422  PCO2ART 28.2* 29.5* 29.4*  PO2ART 76.0* 85.8 81.0   Liver Enzymes  Recent Labs Lab 11/10/14 0105  AST 242*  ALT 314*  ALKPHOS 50  BILITOT 1.0  ALBUMIN 3.1*  Cardiac Enzymes  Recent Labs Lab 11/10/14 0505 11/10/14 1500  TROPONINI 0.13* 0.56*   Glucose  Recent Labs Lab 11/11/14 0331 11/11/14 0816 11/12/14 0001 11/12/14 0527 11/12/14 0553 11/12/14 0804  GLUCAP 85 89 88 69 143* 69    Imaging Dg Chest Port 1 View  11/12/2014   CLINICAL DATA:  Respiratory failure, cardiac arrest, intubated patient.  EXAM: PORTABLE CHEST - 1 VIEW  COMPARISON:  Portable chest x-ray of November 11, 2014  FINDINGS: The lungs are reasonably well inflated. The right hemidiaphragm remains higher than the left. The retrocardiac region on the left remains dense. Interstitial density in the right upper lobe is less conspicuous today. The cardiac silhouette is top-normal in size. The central pulmonary  vascularity is less engorged.  The endotracheal tube tip lies 4 cm above the carina. The esophagogastric tube tip projects below the inferior margin of the image. The left internal jugular venous catheter tip projects over the junction of the proximal and midportions of the SVC.  IMPRESSION: Slight interval improvement in interstitial edema. Left lower lobe atelectasis persists. The support tubes are in reasonable position.   Electronically Signed   By: David  [place] M.D.   On: 11/12/2014 07:13   ASSESSMENT / PLAN:  PULMONARY OETT 8/23 >>> A: Acute hypoxemic respiratory failure in setting cardiac arrest ?Aspiration pneumonitis Improving int changes P:   ABG reviewed, consider slight reduction in MV Wean cpap 5 ps 5 , goal 2 hr, if fails  Increase PS 10-12 pcxr improving, repeat this in am  Consider neg balance further, follow crt further first abx  CARDIOVASCULAR CVL R fem 8/23 >8/23 L IJ TLC 8/23>>> A:  Cardiogenic shock S/p VF arrest Acute systolic CHF H/o HTN  P:  Telemetry monitoring MAP goal > 79mm/Hg Levophed off Mag, k supp  RENAL A:   AKI Hypokalemia Hypo mag  P:   bmet in am  k ,supp, mag kvo Lasix per cards but some caution with crt   GASTROINTESTINAL A:   NPO  P:   Start feeds PPI lft in am   HEMATOLOGIC A:   Anemia DVt prevention P:  Cbc in am  Heparin SQ  INFECTIOUS A:   Concern aspiration  P:   BCx2 8/23 >>> Sputum 8/23 >>>  Unasyn 8/23 >>>  Maintain, pcxr is improved  ENDOCRINE A:   Hyperglycemia without history of DM  P:   SSI CBG monitoring  NEUROLOGIC A:   Acute anoxic encephalopathy ?Area of ischemia/CVA  P:   Failed precedx Start prop WUA  FAMILY  - Updates: No family bedside.  - Inter-disciplinary family meet or Palliative Care meeting due by:  9/1  Ccm time 35 min   Mcarthur Rossetti. Tyson Alias, MD, FACP Pgr: [phone] [facility]

## 2014-11-13 ENCOUNTER — Inpatient Hospital Stay (HOSPITAL_COMMUNITY): Payer: BLUE CROSS/BLUE SHIELD

## 2014-11-13 LAB — COMPREHENSIVE METABOLIC PANEL
ALT: 145 U/L — ABNORMAL HIGH (ref 17–63)
AST: 97 U/L — ABNORMAL HIGH (ref 15–41)
Albumin: 3.2 g/dL — ABNORMAL LOW (ref 3.5–5.0)
Alkaline Phosphatase: 51 U/L (ref 38–126)
Anion gap: 8 (ref 5–15)
BUN: 13 mg/dL (ref 6–20)
CO2: 26 mmol/L (ref 22–32)
Calcium: 8.4 mg/dL — ABNORMAL LOW (ref 8.9–10.3)
Chloride: 105 mmol/L (ref 101–111)
Creatinine, Ser: 1.1 mg/dL (ref 0.61–1.24)
GFR calc Af Amer: >60 mL/min (ref >60)
GFR calc non Af Amer: >60 mL/min (ref >60)
Glucose, Bld: 78 mg/dL (ref 65–99)
Potassium: 3.8 mmol/L (ref 3.5–5.1)
Sodium: 139 mmol/L (ref 135–145)
Total Bilirubin: 1.1 mg/dL (ref 0.3–1.2)
Total Protein: 6.3 g/dL — ABNORMAL LOW (ref 6.5–8.1)

## 2014-11-13 LAB — URINALYSIS, ROUTINE W REFLEX MICROSCOPIC
Bilirubin Urine: NEGATIVE
Glucose, UA: NEGATIVE mg/dL
Ketones, ur: 40 mg/dL — AB
Leukocytes, UA: NEGATIVE
Nitrite: NEGATIVE
Protein, ur: NEGATIVE mg/dL
Specific Gravity, Urine: 1.024 (ref 1.005–1.030)
Urobilinogen, UA: 0.2 mg/dL (ref 0.0–1.0)
pH: 5 (ref 5.0–8.0)

## 2014-11-13 LAB — GLUCOSE, CAPILLARY
Glucose-Capillary: 59 mg/dL — ABNORMAL LOW (ref 65–99)
Glucose-Capillary: 60 mg/dL — ABNORMAL LOW (ref 65–99)
Glucose-Capillary: 77 mg/dL (ref 65–99)
Glucose-Capillary: 77 mg/dL (ref 65–99)
Glucose-Capillary: 86 mg/dL (ref 65–99)
Glucose-Capillary: 86 mg/dL (ref 65–99)
Glucose-Capillary: 88 mg/dL (ref 65–99)
Glucose-Capillary: 93 mg/dL (ref 65–99)
Glucose-Capillary: 96 mg/dL (ref 65–99)

## 2014-11-13 LAB — CBC WITH DIFFERENTIAL/PLATELET
Basophils Absolute: 0 10*3/uL (ref 0.0–0.1)
Basophils Relative: 0 % (ref 0–1)
Eosinophils Absolute: 0.1 10*3/uL (ref 0.0–0.7)
Eosinophils Relative: 1 % (ref 0–5)
HCT: 37.4 % — ABNORMAL LOW (ref 39.0–52.0)
Hemoglobin: 12.4 g/dL — ABNORMAL LOW (ref 13.0–17.0)
Lymphocytes Relative: 9 % — ABNORMAL LOW (ref 12–46)
Lymphs Abs: 1 10*3/uL (ref 0.7–4.0)
MCH: 29.1 pg (ref 26.0–34.0)
MCHC: 33.2 g/dL (ref 30.0–36.0)
MCV: 87.8 fL (ref 78.0–100.0)
Monocytes Absolute: 0.9 10*3/uL (ref 0.1–1.0)
Monocytes Relative: 8 % (ref 3–12)
Neutro Abs: 9.1 10*3/uL — ABNORMAL HIGH (ref 1.7–7.7)
Neutrophils Relative %: 82 % — ABNORMAL HIGH (ref 43–77)
Platelets: 182 10*3/uL (ref 150–400)
RBC: 4.26 MIL/uL (ref 4.22–5.81)
RDW: 13.6 % (ref 11.5–15.5)
WBC: 11.2 10*3/uL — ABNORMAL HIGH (ref 4.0–10.5)

## 2014-11-13 LAB — URINE MICROSCOPIC-ADD ON

## 2014-11-13 MED ORDER — DEXTROSE 50 % IV SOLN
25.0000 mL | Freq: Once | INTRAVENOUS | Status: AC
  Administered 2014-11-13: 25 mL via INTRAVENOUS

## 2014-11-13 MED ORDER — LABETALOL HCL 5 MG/ML IV SOLN
10.0000 mg | Freq: Once | INTRAVENOUS | Status: AC
  Administered 2014-11-13: 10 mg via INTRAVENOUS

## 2014-11-13 MED ORDER — ACETAMINOPHEN 160 MG/5ML PO SOLN
650.0000 mg | ORAL | Status: DC | PRN
  Administered 2014-11-13: 650 mg via ORAL
  Filled 2014-11-13: qty 20.3

## 2014-11-13 MED ORDER — LABETALOL HCL 5 MG/ML IV SOLN
INTRAVENOUS | Status: AC
  Administered 2014-11-13: 10 mg via INTRAVENOUS
  Filled 2014-11-13: qty 4

## 2014-11-13 MED ORDER — ACETAMINOPHEN 10 MG/ML IV SOLN
1000.0000 mg | Freq: Four times a day (QID) | INTRAVENOUS | Status: AC
  Administered 2014-11-13 – 2014-11-14 (×4): 1000 mg via INTRAVENOUS
  Filled 2014-11-13 (×6): qty 100

## 2014-11-13 MED ORDER — POTASSIUM CHLORIDE 10 MEQ/50ML IV SOLN
10.0000 meq | INTRAVENOUS | Status: AC
  Administered 2014-11-13 (×2): 10 meq via INTRAVENOUS
  Filled 2014-11-13 (×2): qty 50

## 2014-11-13 MED ORDER — DEXTROSE 50 % IV SOLN: 25.0000 mL | Freq: Once | INTRAVENOUS | Status: DC

## 2014-11-13 MED ORDER — LISINOPRIL 10 MG PO TABS
10.0000 mg | ORAL_TABLET | Freq: Every day | ORAL | Status: DC
  Administered 2014-11-14 – 2014-11-17 (×4): 10 mg via ORAL
  Filled 2014-11-13 (×4): qty 1

## 2014-11-13 MED ORDER — PIPERACILLIN-TAZOBACTAM 3.375 G IVPB
3.3750 g | Freq: Three times a day (TID) | INTRAVENOUS | Status: DC
  Administered 2014-11-13 – 2014-11-14 (×3): 3.375 g via INTRAVENOUS
  Filled 2014-11-13 (×6): qty 50

## 2014-11-13 MED ORDER — DEXTROSE 50 % IV SOLN
INTRAVENOUS | Status: AC
  Administered 2014-11-13: 25 mL
  Filled 2014-11-13: qty 50

## 2014-11-13 MED ORDER — FUROSEMIDE 10 MG/ML IJ SOLN
40.0000 mg | Freq: Two times a day (BID) | INTRAMUSCULAR | Status: DC
  Administered 2014-11-13 – 2014-11-16 (×7): 40 mg via INTRAVENOUS
  Filled 2014-11-13 (×8): qty 4

## 2014-11-13 MED ORDER — VANCOMYCIN HCL 10 G IV SOLR
1500.0000 mg | Freq: Once | INTRAVENOUS | Status: AC
  Administered 2014-11-13: 1500 mg via INTRAVENOUS
  Filled 2014-11-13: qty 1500

## 2014-11-13 MED ORDER — VANCOMYCIN HCL IN DEXTROSE 1-5 GM/200ML-% IV SOLN
1000.0000 mg | Freq: Three times a day (TID) | INTRAVENOUS | Status: DC
  Administered 2014-11-13 – 2014-11-14 (×2): 1000 mg via INTRAVENOUS
  Filled 2014-11-13 (×6): qty 200

## 2014-11-13 NOTE — Progress Notes (Addendum)
ANTIBIOTIC CONSULT NOTE - INITIAL  Pharmacy Consult for vancomycin & Zosyn Indication: aspiration pneumonia  No Known Allergies  Patient Measurements: Height:  (172.7 cm) Weight: 182 lb 1.6 oz (82.6 kg) IBW/kg (Calculated) : 68.4   Vital Signs: Temp: 99 F (37.2 C) (08/26 0800) Temp Source: Core (Comment) (08/26 0800) BP: 124/83 mmHg (08/26 0900) Pulse Rate: 56 (08/26 0900) Intake/Output from previous day: 08/25 0701 - 08/26 0700 In: 2336.6 [I.V.:1143.6; NG/GT:743; IV Piggyback:450] Out: 2080 [Urine:1980; Emesis/NG output:100] Intake/Output from this shift: Total I/O In: 107.5 [I.V.:42.5; NG/GT:65] Out: -   Labs:  Recent Labs  11/11/14 0520 11/11/14 1143 11/12/14 0219 11/13/14 0400  WBC 7.5  --  9.3 11.2*  HGB 14.3 14.6 13.0 12.4*  PLT 178  --  179 182  CREATININE 0.77 0.70 1.12 1.10   Estimated Creatinine Clearance: 80.5 mL/min (by C-G formula based on Cr of 1.1). No results for input(s): VANCOTROUGH, VANCOPEAK, VANCORANDOM, GENTTROUGH, GENTPEAK, GENTRANDOM, TOBRATROUGH, TOBRAPEAK, TOBRARND, AMIKACINPEAK, AMIKACINTROU, AMIKACIN in the last 72 hours.   Microbiology: Recent Results (from the past 720 hour(s))  Culture, blood (routine x 2)     Status: None (Preliminary result)   Collection Time: 11/10/14  4:57 AM  Result Value Ref Range Status   Specimen Description BLOOD HAND RIGHT  Final   Special Requests BOTTLES DRAWN AEROBIC ONLY 1CC  Final   Culture NO GROWTH 2 DAYS  Final   Report Status PENDING  Incomplete  Culture, blood (routine x 2)     Status: None (Preliminary result)   Collection Time: 11/10/14  5:01 AM  Result Value Ref Range Status   Specimen Description BLOOD HAND RIGHT  Final   Special Requests BOTTLES DRAWN AEROBIC ONLY 5CC  Final   Culture NO GROWTH 2 DAYS  Final   Report Status PENDING  Incomplete  MRSA PCR Screening     Status: None   Collection Time: 11/10/14  5:12 AM  Result Value Ref Range Status   MRSA by PCR NEGATIVE  NEGATIVE Final    Comment:        The GeneXpert MRSA Assay (FDA approved for NASAL specimens only), is one component of a comprehensive MRSA colonization surveillance program. It is not intended to diagnose MRSA infection nor to guide or monitor treatment for MRSA infections.   Culture, respiratory (NON-Expectorated)     Status: None   Collection Time: 11/10/14 11:15 AM  Result Value Ref Range Status   Specimen Description TRACHEAL ASPIRATE  Final   Special Requests NONE  Final   Gram Stain   Final    MODERATE WBC PRESENT, PREDOMINANTLY PMN RARE SQUAMOUS EPITHELIAL CELLS PRESENT NO ORGANISMS SEEN Performed at [facility]    Culture   Final    Non-Pathogenic Oropharyngeal-type Flora Isolated. Performed at [facility]    Report Status 11/12/2014 FINAL  Final    Medical History: Past Medical History  Diagnosis Date  . Hypertension   . Chronic kidney disease   . Arthritis     Medications:  Prescriptions prior to admission  Medication Sig Dispense Refill Last Dose  . amLODipine (NORVASC) 10 MG tablet Take 10 mg by mouth daily.   11/10/2014  . aspirin EC 81 MG tablet Take 81 mg by mouth daily.   11/10/2014  . carvedilol (COREG) 6.25 MG tablet Take 6.25 mg by mouth 2 (two) times daily.   11/10/2014 at 0900  . hydrochlorothiazide (HYDRODIURIL) 50 MG tablet Take 50 mg by mouth daily.   11/10/2014  .  lisinopril (PRINIVIL,ZESTRIL) 20 MG tablet Take 20 mg by mouth daily.   11/10/2014  . lovastatin (MEVACOR) 20 MG tablet Take 20 mg by mouth daily.   11/10/2014  . POTASSIUM PO Take 1 tablet by mouth daily. OTC potassium   11/10/2014   Assessment: 54 yo man admitted 11/10/2014 after witnessed VFib arrest at home, PEA arrest in ED 2/2 hypoxia on [place] protocol. Concern for aspiration pna, previously on Unasyn. Febrile to 39.2 this AM, received APAP 500 x 1.  Wbc 11.2, febrile. Cr 1.1, stable CrCl ~80.5 ml/min. Will need to watch CrCl closely, borderline for  vancomycin dosing.  8/23 Unasyn >>8/26 8/26 Zosyn >> 8/26 vancomycin >>  8/26 blood x 2 >> 8/23 blood x2 >> 8/23 TA >> 8/23 MRSA neg  Goal of Therapy:  Vancomycin trough level 15-20 mcg/ml  Plan:  Start Zosyn 3.375 g IV q8h extended infusion Give vancomycin 1500 mg load x 1  Start vancomycin 1000 mg IV q8h  Vanc trough tomorrow, 8/27 @ 1130 Follow up culture results Monitor clinical status and any changes in renal function   Hillery Aldo, [place].D. PGY2 Cardiology Pharmacy Resident Pager: [phone]  11/13/2014,11:06 AM

## 2014-11-13 NOTE — Progress Notes (Signed)
PT Cancellation Note  Patient Details Name: Brian Noble MRN: [id number] DOB: [birth year]   Cancelled Treatment:    Reason Eval/Treat Not Completed: Medical issues which prohibited therapy (pt not yet 4 hours post extubation. Will attempt next date)   Delorse Lek 11/13/2014, 11:44 AM Delaney Meigs, PT [phone]

## 2014-11-13 NOTE — Procedures (Signed)
Extubation Procedure Note  Patient Details:   Name: Brian Noble DOB: 1961-01-02 MRN: [id number]   Airway Documentation:     Evaluation  O2 sats: stable throughout Complications: No apparent complications Patient did tolerate procedure well. Bilateral Breath Sounds: Diminished, Expiratory wheezes Suctioning: Oral, Airway Yes   Pt extubated to 2L [place] per Dr Tyson Alias. Pt unable to speak his name at first, however, with more prompts by Dr Tyson Alias, Pt able to speak it. Pt has a very weak loose cough and is encouraged to spit it up and use a yankeur to remove secretions. Pt with fine crackles throughout. Pt has increased clear/tan thick oral secretions. RT will continue to monitor.   Carolan Shiver 11/13/2014, 9:23 AM

## 2014-11-13 NOTE — Progress Notes (Addendum)
PULMONARY / CRITICAL CARE MEDICINE   Name: MARKELL SCIASCIA MRN: [id number] DOB: [birth year]    ADMISSION DATE:  11/10/2014 CONSULTATION DATE:  11/10/2014  REFERRING MD :  EDP  CHIEF COMPLAINT:  Witnessed arrest  INITIAL PRESENTATION: 54 year old male who suffered a witnessed cardiac arrest at home. VF. Downtime reportedly less than 20 mins. Combative after ROSC. Intubated in ED and taken to cath lab where he was found to have clean coronary arteries. PCCM to see.   STUDIES:  8/23 LHC > 8/23 CT head > Questionable vague area decreased density in the anterior right temporal lobe. This may reflect a region of acute or subacute ischemia versus volume averaging.  SIGNIFICANT EVENTS: 8/23 witnessed arrest, Cath, to ICU. Hypothermia protocol initiated.  SUBJECTIVE: shiverring  VITAL SIGNS: Temp:  [96.8 F (36 C)-100.2 F (37.9 C)] 98.8 F (37.1 C) (08/26 0600) Pulse Rate:  [25-118] 118 (08/26 0753) Resp:  [14-31] 28 (08/26 0753) BP: (86-233)/(47-200) 123/53 mmHg (08/26 0753) SpO2:  [92 %-100 %] 98 % (08/26 0753) Arterial Line BP: (151)/(76) 151/76 mmHg (08/25 0900) FiO2 (%):  [30 %] 30 % (08/26 0753) Weight:  [82.6 kg (182 lb 1.6 oz)-84.8 kg (186 lb 15.2 oz)] 82.6 kg (182 lb 1.6 oz) (08/26 0334) HEMODYNAMICS:   VENTILATOR SETTINGS: Vent Mode:  [-] PCV FiO2 (%):  [30 %] 30 % Set Rate:  [15 bmp] 15 bmp PEEP:  [5 cmH20] 5 cmH20 Plateau Pressure:  [15 cmH20-16 cmH20] 16 cmH20 INTAKE / OUTPUT:  Intake/Output Summary (Last 24 hours) at 11/13/14 0858 Last data filed at 11/13/14 0600  Gross per 24 hour  Intake 2201.51 ml  Output   2005 ml  Net 196.51 ml   PHYSICAL EXAMINATION: General:  Middle aged male, overweight Sedated rass 0 Neuro:  Sedated rass 0, moves all ext equally, FC HEENT:  PERRL 2mm Cardiovascular:  RRR, no MRG Lungs: CTA anterior, mild end exp wheezing int Abdomen:  Soft, non-tender, non-distended, no r/g Musculoskeletal:  No acute deformity or ROM limitation.   Skin:  Grossly intact  LABS:  CBC  Recent Labs Lab 11/11/14 0520 11/11/14 1143 11/12/14 0219 11/13/14 0400  WBC 7.5  --  9.3 11.2*  HGB 14.3 14.6 13.0 12.4*  HCT 41.9 43.0 38.4* 37.4*  PLT 178  --  179 182   Coag's  Recent Labs Lab 11/10/14 0105 11/10/14 0505 11/10/14 1145  APTT INR 1.27 1.40 1.30   BMET  Recent Labs Lab 11/11/14 0520 11/11/14 1143 11/12/14 0219 11/13/14 0400  NA 135 140 139 139  K 3.4* 3.6 3.6 3.8  CL 108 109 110 105  CO2 17*  --  21* 26  BUN CREATININE 0.77 0.70 1.12 1.10  GLUCOSE 95 101* 86 78   Electrolytes  Recent Labs Lab 11/10/14 0505  11/11/14 0520 11/12/14 0219 11/13/14 0400  CALCIUM 6.6*  < > 7.6* 7.9* 8.4*  MG 1.6*  --  1.7 1.7  --   PHOS 3.8  --  2.5 3.0  --   < > = values in this interval not displayed. Sepsis Markers  Recent Labs Lab 11/10/14 0556 11/10/14 1125  LATICACIDVEN 2.0 1.75   ABG  Recent Labs Lab 11/11/14 1330 11/12/14 0337 11/12/14 0439  PHART 7.412 7.460* 7.422  PCO2ART 28.2* 29.5* 29.4*  PO2ART 76.0* 85.8 81.0   Liver Enzymes  Recent Labs Lab 11/10/14 0105 11/13/14 0400  AST 242* 97*  ALT 314* 145*  ALKPHOS  50 51  BILITOT 1.0 1.1  ALBUMIN 3.1* 3.2*   Cardiac Enzymes  Recent Labs Lab 11/10/14 0505 11/10/14 1500  TROPONINI 0.13* 0.56*   Glucose  Recent Labs Lab 11/12/14 1956 11/12/14 2358 11/13/14 0024 11/13/14 0341 11/13/14 0409 11/13/14 0805  GLUCAP 76 59* 86 60* 93 77    Imaging Dg Chest Port 1 View  11/13/2014   CLINICAL DATA:  Pneumonia  EXAM: PORTABLE CHEST - 1 VIEW  COMPARISON:  11/12/2014  FINDINGS: Endotracheal tube, nasogastric catheter and left jugular central line are again seen and stable. The overall inspiratory effort is poor. Vascular crowding is noted centrally. Mild persistent left basilar atelectasis is noted. The cardiac shadow is stable.  IMPRESSION: Poor inspiratory effort.  Persistent left basilar atelectasis.    Electronically Signed   By: Alcide Clever M.D.   On: 11/13/2014 07:02   ASSESSMENT / PLAN:  PULMONARY OETT 8/23 >>> A: Acute hypoxemic respiratory failure in setting cardiac arrest ?Aspiration pneumonitis Improving int changes P:   Some increase patchy this am  ETT low, out 2 cm Wean cpap 5 ps 5, goal 1 hr, assess rsbi Neurostatus improved, protects airway well Neg balance goals, was even last 24 hr, may need BID lasix Upright  CARDIOVASCULAR CVL R fem 8/23 >8/23 L IJ TLC 8/23>>> A:  Cardiogenic shock S/p VF arrest Acute systolic CHF H/o HTN  P:  Telemetry monitoring MAP goal > 56mm/Hg ICD will be required in future per cards  RENAL A:   AKI  P:   bmet in am  kvo Lasix per cards agree bid needed May need to hold ACEI  GASTROINTESTINAL A:   TF started Mild transaminase elevation P:   Start feed at goal, remain, may hold if weaning well PPI lft in 48 hr  HEMATOLOGIC A:   Anemia DVt prevention P:  Limit phebotomy Heparin SQ  INFECTIOUS A:   Concern aspiration PNA  P:   BCx2 8/23 >>> Sputum 8/23 >>>NF  Unasyn 8/23 >>>  Maintain, establish stop date, consider 7 days  ENDOCRINE A:   DM Hypoglcyemia  P:   LFt wnl overall CBG monitoring, D10 noted, may need increase if NPO Have correlated with serum  NEUROLOGIC A:   Acute anoxic encephalopathy ?Area of ischemia/CVA  P:   prop / fent wua May need repeat imaging  FAMILY  - Updates: wife updated 8/25 and today  - Inter-disciplinary family meet or Palliative Care meeting due by:  9/1  Ccm time 30 min   Mcarthur Rossetti. Tyson Alias, MD, FACP Pgr: [phone] [facility]

## 2014-11-13 NOTE — Progress Notes (Signed)
Hypoglycemic Event  CBG: 59  Treatment: D50 IV 25 mL  Symptoms: None  Follow-up CBG: Time:0024 CBG Result:86  Possible Reasons for Event: Unknown    Abbott Pao  Remember to initiate Hypoglycemia Order Set & complete

## 2014-11-13 NOTE — Progress Notes (Signed)
Patient Name: Brian Noble Date of Encounter: 11/13/2014  Active Problems:   Sudden cardiac death   Cardiac arrest   Acute respiratory failure with hypoxia   Hypoxia   Cardiogenic shock   Length of Stay: 3  SUBJECTIVE  Shivering. Opens eyes to voice. Sedated and intubated, on propofol and fentanyl Matched in and out Mildly tachycardic  CURRENT MEDS . ampicillin-sulbactam (UNASYN) IV  3 g Intravenous 3 times per day  . antiseptic oral rinse  7 mL Mouth Rinse 10 times per day  . aspirin  81 mg Oral Daily  . chlorhexidine gluconate  15 mL Mouth Rinse BID  . dextrose  25 mL Intravenous Once  . famotidine (PEPCID) IV  20 mg Intravenous Q12H  . fentaNYL (SUBLIMAZE) injection  100 mcg Intravenous Once  . furosemide  40 mg Intravenous Q12H  . heparin  5,000 Units Subcutaneous 3 times per day  . lisinopril  10 mg Oral Daily  . potassium chloride  20 mEq Oral Daily  . sodium chloride  10-40 mL Intracatheter Q12H  . sodium chloride  3 mL Intravenous Q12H    OBJECTIVE   Intake/Output Summary (Last 24 hours) at 11/13/14 0803 Last data filed at 11/13/14 0600  Gross per 24 hour  Intake 2201.51 ml  Output   2005 ml  Net 196.51 ml   Filed Weights   11/10/14 0350 11/12/14 1400 11/13/14 0334  Weight: 185 lb 6.5 oz (84.1 kg) 186 lb 15.2 oz (84.8 kg) 182 lb 1.6 oz (82.6 kg)    PHYSICAL EXAM Filed Vitals:   11/13/14 0415 11/13/14 0500 11/13/14 0600 11/13/14 0753  BP:  135/96 136/96 123/53  Pulse: 110 99 103 118  Temp:   98.8 F (37.1 C)   TempSrc:      Resp: 26 14 23 28   Height:      Weight:      SpO2: 97% 96% 98% 98%   General: Alert, oriented x3, no distress Head: no evidence of trauma, PERRL, EOMI, no exophtalmos or lid lag, no myxedema, no xanthelasma; normal ears, nose and oropharynx Neck: normal jugular venous pulsations and no hepatojugular reflux; brisk carotid pulses without delay and no carotid bruits Chest: reduced BS left base, bilateral rhonchi and  wheezes, no signs of consolidation by percussion or palpation, normal fremitus, symmetrical and full respiratory excursions Cardiovascular: normal position and quality of the apical impulse, regular rhythm, normal first and second heart sounds, no rubs or gallops, no murmur Abdomen: no tenderness or distention, no masses by palpation, no abnormal pulsatility or arterial bruits, normal bowel sounds, no hepatosplenomegaly Extremities: no clubbing, cyanosis or edema; 2+ radial, ulnar and brachial pulses bilaterally; 2+ right femoral, posterior tibial and dorsalis pedis pulses; 2+ left femoral, posterior tibial and dorsalis pedis pulses; no subclavian or femoral bruits Neurological: grossly nonfocal  LABS  CBC  Recent Labs  11/12/14 0219 11/13/14 0400  WBC 9.3 11.2*  NEUTROABS  --  9.1*  HGB 13.0 12.4*  HCT 38.4* 37.4*  MCV 85.7 87.8  PLT 179 182   Basic Metabolic Panel  Recent Labs  11/11/14 0520  11/12/14 0219 11/13/14 0400  NA 135  < > 139 139  K 3.4*  < > 3.6 3.8  CL 108  < > 110 105  CO2 17*  --  21* 26  GLUCOSE 95  < > 86 78  BUN 7  < > 13 13  CREATININE 0.77  < > 1.12 1.10  CALCIUM 7.6*  --  7.9* 8.4*  MG 1.7  --  1.7  --   PHOS 2.5  --  3.0  --   < > = values in this interval not displayed. Liver Function Tests  Recent Labs  11/13/14 0400  AST 97*  ALT 145*  ALKPHOS 51  BILITOT 1.1  PROT 6.3*  ALBUMIN 3.2*   No results for input(s): LIPASE, AMYLASE in the last 72 hours. Cardiac Enzymes  Recent Labs  11/10/14 1500  TROPONINI 0.56*   BNP Invalid input(s): POCBNP D-Dimer No results for input(s): DDIMER in the last 72 hours. Hemoglobin A1C No results for input(s): HGBA1C in the last 72 hours. Fasting Lipid Panel  Recent Labs  11/12/14 1100  TRIG 165*   Thyroid Function Tests No results for input(s): TSH, T4TOTAL, T3FREE, THYROIDAB in the last 72 hours.  Invalid input(s): FREET3  Radiology Studies Imaging results have been reviewed and Dg  Chest Port 1 View  11/13/2014   CLINICAL DATA:  Pneumonia  EXAM: PORTABLE CHEST - 1 VIEW  COMPARISON:  11/12/2014  FINDINGS: Endotracheal tube, nasogastric catheter and left jugular central line are again seen and stable. The overall inspiratory effort is poor. Vascular crowding is noted centrally. Mild persistent left basilar atelectasis is noted. The cardiac shadow is stable.  IMPRESSION: Poor inspiratory effort.  Persistent left basilar atelectasis.   Electronically Signed   By: Alcide Clever M.D.   On: 11/13/2014 07:02   Dg Chest Port 1 View  11/12/2014   CLINICAL DATA:  Respiratory failure, cardiac arrest, intubated patient.  EXAM: PORTABLE CHEST - 1 VIEW  COMPARISON:  Portable chest x-ray of November 11, 2014  FINDINGS: The lungs are reasonably well inflated. The right hemidiaphragm remains higher than the left. The retrocardiac region on the left remains dense. Interstitial density in the right upper lobe is less conspicuous today. The cardiac silhouette is top-normal in size. The central pulmonary vascularity is less engorged.  The endotracheal tube tip lies 4 cm above the carina. The esophagogastric tube tip projects below the inferior margin of the image. The left internal jugular venous catheter tip projects over the junction of the proximal and midportions of the SVC.  IMPRESSION: Slight interval improvement in interstitial edema. Left lower lobe atelectasis persists. The support tubes are in reasonable position.   Electronically Signed   By: David  [place] M.D.   On: 11/12/2014 07:13    TELE Sinus tachycardia   ASSESSMENT AND PLAN  1. S/P cardiac arrest - triggered by severe hypokalemia on background of longstanding (6-7 years) of cardiomyopathy. If he does indeed have a good neurological recovery, he meets criteria for secondary prevention ICD (AVID), before hospital DC (CRT-D). 2. Severe nonischemic cardiomyopathy - per family report had excellent functional status pre-arrest (caring for  livestock, preaching, "always moving fast"). LBBB raises opportunity for CRT nonetheless. Likely good response with nonischemic cardiomyopathy. 3. Acute on chronic HF - by CXR (poor film, expiratory) still volume overloaded. Give diuretic, increase ACEi. Resume carvedilol once euvolemic. 4. Hypokalemia - K better; possibly still overall mildly K depleted (was on HCTZ 50 mg daily, admission K 2.6) 5. Respiratory failure -CHF, left lower lobe atelectasis, possible aspiration. Failed wean yesterday, try again today.  Thurmon Fair, MD, [facility] [phone] office [phone] pager 11/13/2014 8:03 AM

## 2014-11-13 NOTE — Progress Notes (Signed)
Utilization review completed.  

## 2014-11-13 NOTE — Progress Notes (Signed)
Hypoglycemic Event  CBG: 60  Treatment: D50 IV 25 mL  Symptoms: None  Follow-up CBG: Time:0409 CBG Result:93  Possible Reasons for Event: Unknown    Brian Noble  Remember to initiate Hypoglycemia Order Set & complete

## 2014-11-14 ENCOUNTER — Inpatient Hospital Stay (HOSPITAL_COMMUNITY): Payer: BLUE CROSS/BLUE SHIELD

## 2014-11-14 DIAGNOSIS — T82594A Other mechanical complication of infusion catheter, initial encounter: Secondary | ICD-10-CM | POA: Insufficient documentation

## 2014-11-14 LAB — GLUCOSE, CAPILLARY
Glucose-Capillary: 85 mg/dL (ref 65–99)
Glucose-Capillary: 93 mg/dL (ref 65–99)
Glucose-Capillary: 121 mg/dL — ABNORMAL HIGH (ref 65–99)
Glucose-Capillary: 75 mg/dL (ref 65–99)
Glucose-Capillary: 119 mg/dL — ABNORMAL HIGH (ref 65–99)

## 2014-11-14 LAB — BASIC METABOLIC PANEL
Anion gap: 8 (ref 5–15)
BUN: 14 mg/dL (ref 6–20)
CO2: 26 mmol/L (ref 22–32)
Calcium: 8.4 mg/dL — ABNORMAL LOW (ref 8.9–10.3)
Chloride: 103 mmol/L (ref 101–111)
Creatinine, Ser: 1.09 mg/dL (ref 0.61–1.24)
GFR calc Af Amer: >60 mL/min (ref >60)
GFR calc non Af Amer: >60 mL/min (ref >60)
Glucose, Bld: 91 mg/dL (ref 65–99)
Potassium: 3.8 mmol/L (ref 3.5–5.1)
Sodium: 137 mmol/L (ref 135–145)

## 2014-11-14 LAB — PHOSPHORUS: Phosphorus: 4.3 mg/dL (ref 2.5–4.6)

## 2014-11-14 LAB — MAGNESIUM: Magnesium: 2 mg/dL (ref 1.7–2.4)

## 2014-11-14 MED ORDER — SODIUM CHLORIDE 0.9 % IV SOLN
3.0000 g | Freq: Three times a day (TID) | INTRAVENOUS | Status: DC
  Administered 2014-11-14 – 2014-11-17 (×9): 3 g via INTRAVENOUS
  Filled 2014-11-14 (×13): qty 3

## 2014-11-14 MED FILL — Medication: Qty: 1 | Status: AC

## 2014-11-14 NOTE — Evaluation (Signed)
Physical Therapy Evaluation Patient Details Name: Brian Noble MRN: [id number] DOB: 18-Dec-1960 Today's Date: 11/14/2014   History of Present Illness  54 year old male who suffered a witnessed cardiac arrest at home. VF. Downtime reportedly less than 20 mins. Intubated 8/23-8/26, cath clear, hypothermia protocol. 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 Pt moving well and able to ambulate today with min assist. Pt will benefit from continued therapy to maximize mobility, strength, balance, gait and function to decrease burden of care. Pt educated for movement restrictions post pacemaker placement in preparation for surgery. Recommend daily mobility with nursing staff.     Follow Up Recommendations Home health PT;Supervision for mobility/OOB    Equipment Recommendations  Rolling walker with 5" wheels    Recommendations for Other Services       Precautions / Restrictions Precautions Precautions: Fall      Mobility  Bed Mobility Overal bed mobility: Needs Assistance Bed Mobility: Supine to Sit     Supine to sit: Min assist     General bed mobility comments: cues for sequence with use of rail and increased time, assist to elevate trunk from surface  Transfers Overall transfer level: Needs assistance   Transfers: Sit to/from Stand Sit to Stand: Min assist         General transfer comment: cues for sequence with assist to elevate from surface. In standing cues for anterior translation and hip extension, tendency for flexed posture and posterior lean  Ambulation/Gait Ambulation/Gait assistance: Min assist Ambulation Distance (Feet): 200 Feet Assistive device: Rolling walker (2 wheeled) Gait Pattern/deviations: Step-through pattern;Decreased stride length;Trunk flexed;Narrow base of support   Gait velocity interpretation: Below normal speed for age/gender General Gait Details: cues  for posture, position in RW, anterior translation and assist to direct RW  Stairs            Wheelchair Mobility    Modified Rankin (Stroke Patients Only)       Balance Overall balance assessment: Needs assistance   Sitting balance-Leahy Scale: Fair       Standing balance-Leahy Scale: Poor                               Pertinent Vitals/Pain Pain Assessment: 0-10 Pain Score: 3  Pain Location: rectal tube Pain Descriptors / Indicators: Sore Pain Intervention(s): Limited activity within patient's tolerance;Repositioned  HR 76-98 sats 90-95% on RA BP 147/81 after activity    Home Living Family/patient expects to be discharged to:: Private residence Living Arrangements: Spouse/significant other Available Help at Discharge: Available 24 hours/day;Family Type of Home: House Home Access: Stairs to enter   Secretary/administrator of Steps: 3 Home Layout: One level Home Equipment: None      Prior Function Level of Independence: Independent               Hand Dominance        Extremity/Trunk Assessment   Upper Extremity Assessment: Generalized weakness           Lower Extremity Assessment: Generalized weakness      Cervical / Trunk Assessment: Normal  Communication   Communication: No difficulties  Cognition Arousal/Alertness: Awake/alert Behavior During Therapy: WFL for tasks assessed/performed Overall Cognitive Status: Within Functional Limits for tasks assessed  General Comments      Exercises        Assessment/Plan    PT Assessment Patient needs continued PT services  PT Diagnosis Difficulty walking;Generalized weakness   PT Problem List Decreased strength;Decreased activity tolerance;Decreased balance;Decreased knowledge of use of DME;Decreased mobility  PT Treatment Interventions Gait training;DME instruction;Stair training;Functional mobility training;Therapeutic activities;Therapeutic  exercise;Patient/family education;Balance training   PT Goals (Current goals can be found in the Care Plan section) Acute Rehab PT Goals Patient Stated Goal: 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 PT Goal Formulation: With patient/family Time For Goal Achievement: 11/28/14 Potential to Achieve Goals: Good    Frequency Min 3X/week   Barriers to discharge        Co-evaluation               End of Session Equipment Utilized During Treatment: Gait belt Activity Tolerance: Patient tolerated treatment well Patient left: in chair;with call bell/phone within reach;with chair alarm set;with family/visitor present Nurse Communication: Mobility status         Time: 1610-9604 PT Time Calculation (min) (ACUTE ONLY): 26 min   Charges:   PT Evaluation $Initial PT Evaluation Tier I: 1 Procedure PT Treatments $Therapeutic Activity: 8-22 mins   PT G CodesDelorse Lek 11/14/2014, 11:32 AM Delaney Meigs, PT [phone]

## 2014-11-14 NOTE — Progress Notes (Signed)
Patient ID: ATTICUS WEDIN, male   DOB: [birth year], 54 y.o.   MRN: [id number]    Patient Name: Brian Noble Date of Encounter: 11/14/2014     Active Problems:   Sudden cardiac death   Cardiac arrest   Acute respiratory failure with hypoxia   Hypoxia   Cardiogenic shock    SUBJECTIVE  Denies chest pain or sob. Alert and oriented.  CURRENT MEDS . antiseptic oral rinse  7 mL Mouth Rinse 10 times per day  . aspirin  81 mg Oral Daily  . chlorhexidine gluconate  15 mL Mouth Rinse BID  . dextrose  25 mL Intravenous Once  . famotidine (PEPCID) IV  20 mg Intravenous Q12H  . fentaNYL (SUBLIMAZE) injection  100 mcg Intravenous Once  . furosemide  40 mg Intravenous Q12H  . heparin  5,000 Units Subcutaneous 3 times per day  . lisinopril  10 mg Oral Daily  . piperacillin-tazobactam (ZOSYN)  IV  3.375 g Intravenous 3 times per day  . potassium chloride  20 mEq Oral Daily  . sodium chloride  10-40 mL Intracatheter Q12H  . sodium chloride  3 mL Intravenous Q12H  . vancomycin  1,000 mg Intravenous Q8H    OBJECTIVE  Filed Vitals:   11/14/14 0400 11/14/14 0500 11/14/14 0600 11/14/14 0700  BP: 127/74 135/73 125/68 127/66  Pulse: 66 78 71 66  Temp: 99.1 F (37.3 C) 99.5 F (37.5 C) 99.5 F (37.5 C) 99.5 F (37.5 C)  TempSrc:      Resp: 16 21 19 18   Height:      Weight:    180 lb 1.9 oz (81.7 kg)  SpO2: 100% 99% 98% 99%    Intake/Output Summary (Last 24 hours) at 11/14/14 0849 Last data filed at 11/14/14 0700  Gross per 24 hour  Intake   2092 ml  Output   4275 ml  Net  -2183 ml   Filed Weights   11/12/14 1400 11/13/14 0334 11/14/14 0700  Weight: 186 lb 15.2 oz (84.8 kg) 182 lb 1.6 oz (82.6 kg) 180 lb 1.9 oz (81.7 kg)    PHYSICAL EXAM  General: Pleasant, NAD. Neuro: Alert and oriented X 3. Moves all extremities spontaneously. Psych: Normal affect. HEENT:  Normal  Neck: Supple without bruits or JVD. Lungs:  Resp regular and unlabored, CTA. Heart: RRR no s3, s4, or  murmurs. Abdomen: Soft, non-tender, non-distended, BS + x 4.  Extremities: No clubbing, cyanosis or edema. DP/PT/Radials 2+ and equal bilaterally.  Accessory Clinical Findings  CBC  Recent Labs  11/12/14 0219 11/13/14 0400  WBC 9.3 11.2*  NEUTROABS  --  9.1*  HGB 13.0 12.4*  HCT 38.4* 37.4*  MCV 85.7 87.8  PLT 179 182   Basic Metabolic Panel  Recent Labs  11/12/14 0219 11/13/14 0400 11/14/14 0443  NA 139 139 137  K 3.6 3.8 3.8  CL 110 105 103  CO2 21* 26 26  GLUCOSE 86 78 91  BUN 13 13 14   CREATININE 1.12 1.10 1.09  CALCIUM 7.9* 8.4* 8.4*  MG 1.7  --  2.0  PHOS 3.0  --  4.3   Liver Function Tests  Recent Labs  11/13/14 0400  AST 97*  ALT 145*  ALKPHOS 51  BILITOT 1.1  PROT 6.3*  ALBUMIN 3.2*   No results for input(s): LIPASE, AMYLASE in the last 72 hours. Cardiac Enzymes No results for input(s): CKTOTAL, CKMB, CKMBINDEX, TROPONINI in the last 72 hours. BNP Invalid input(s): POCBNP D-Dimer No  results for input(s): DDIMER in the last 72 hours. Hemoglobin A1C No results for input(s): HGBA1C in the last 72 hours. Fasting Lipid Panel  Recent Labs  11/12/14 1100  TRIG 165*   Thyroid Function Tests No results for input(s): TSH, T4TOTAL, T3FREE, THYROIDAB in the last 72 hours.  Invalid input(s): FREET3  TELE  NSR with PVC's   Radiology/Studies  Ct Head Wo Contrast  11/10/2014   CLINICAL DATA:  Left-sided weakness.  Post cardiac arrest and CPR.  EXAM: CT HEAD WITHOUT CONTRAST  TECHNIQUE: Contiguous axial images were obtained from the base of the skull through the vertex without intravenous contrast.  COMPARISON:  None.  FINDINGS: Questionable vague area decreased density in the anterior right temporal lobe without mass effect. No intracranial hemorrhage or midline shift. No hydrocephalus. The basilar cisterns are patent. No cerebral edema. No intracranial fluid collection. Calvarium is intact. Minimal opacification of ethmoid air cells and small  fluid levels in the sphenoid sinus, may be related to intubation. The mastoid air cells are well aerated.  IMPRESSION: Questionable vague area decreased density in the anterior right temporal lobe. This may reflect a region of acute or subacute ischemia versus volume averaging. There is no hemorrhage or mass effect.  These results were called by telephone at the time of interpretation on 11/10/2014 at 2:56 am to Dr. Loren Racer , who verbally acknowledged these results.   Electronically Signed   By: Rubye Oaks M.D.   On: 11/10/2014 02:56   Dg Chest Port 1 View  11/13/2014   CLINICAL DATA:  Pneumonia  EXAM: PORTABLE CHEST - 1 VIEW  COMPARISON:  11/12/2014  FINDINGS: Endotracheal tube, nasogastric catheter and left jugular central line are again seen and stable. The overall inspiratory effort is poor. Vascular crowding is noted centrally. Mild persistent left basilar atelectasis is noted. The cardiac shadow is stable.  IMPRESSION: Poor inspiratory effort.  Persistent left basilar atelectasis.   Electronically Signed   By: Alcide Clever M.D.   On: 11/13/2014 07:02   Dg Chest Port 1 View  11/12/2014   CLINICAL DATA:  Respiratory failure, cardiac arrest, intubated patient.  EXAM: PORTABLE CHEST - 1 VIEW  COMPARISON:  Portable chest x-ray of November 11, 2014  FINDINGS: The lungs are reasonably well inflated. The right hemidiaphragm remains higher than the left. The retrocardiac region on the left remains dense. Interstitial density in the right upper lobe is less conspicuous today. The cardiac silhouette is top-normal in size. The central pulmonary vascularity is less engorged.  The endotracheal tube tip lies 4 cm above the carina. The esophagogastric tube tip projects below the inferior margin of the image. The left internal jugular venous catheter tip projects over the junction of the proximal and midportions of the SVC.  IMPRESSION: Slight interval improvement in interstitial edema. Left lower lobe  atelectasis persists. The support tubes are in reasonable position.   Electronically Signed   By: David  [place] M.D.   On: 11/12/2014 07:13   Dg Chest Port 1 View  11/11/2014   CLINICAL DATA:  Cardiac arrest, acute respiratory failure, intubated patient.  EXAM: PORTABLE CHEST - 1 VIEW  COMPARISON:  Portable chest x-ray of November 10, 2014  FINDINGS: The lungs are adequately inflated. There remain mildly increased interstitial densities in the upper perihilar regions. These are less conspicuous today. The cardiac silhouette is normal in size. The pulmonary vascularity is less engorged. There are external pacing and defibrillator pads present. The endotracheal tube tip lies 5.4 cm  above the carina. The esophagogastric tube tip projects below the inferior margin of the image. A new left internal jugular venous catheter tip projects over the midportion of the SVC  IMPRESSION: Interval improvement in the appearance of the pulmonary interstitium consistent with resolving interstitial edema. There is persistent subsegmental left lower lobe atelectasis. The support tubes are in reasonable position.   Electronically Signed   By: David  [place] M.D.   On: 11/11/2014 07:10   Dg Chest Port 1 View  11/10/2014   CLINICAL DATA:  Clotted central line, initial encounter  EXAM: PORTABLE CHEST - 1 VIEW  COMPARISON:  Portable exam 1320 hours compared to 0429 hours  FINDINGS: Tip of endotracheal tube projects 4.9 cm above carina.  Nasogastric tube extends into stomach.  LEFT jugular central venous catheter tip projects over SVC.  External pacing leads in EKG leads project over chest.  Upper normal size of cardiac silhouette.  Persistent perihilar infiltrates greatest in RIGHT upper lobe and LEFT lower lobe, question edema versus infection.  No pleural effusion or pneumothorax.  IMPRESSION: Persistent pulmonary infiltrates.  Stable line and tube positions.  Little interval change.   Electronically Signed   By: Ulyses Southward M.D.   On:  11/10/2014 13:28   Dg Chest Port 1 View  11/10/2014   CLINICAL DATA:  Hypoxia.  EXAM: PORTABLE CHEST - 1 VIEW  COMPARISON:  Earlier today at 0047 hour  FINDINGS: 0429 hour: Endotracheal tube is approximately 4.0 cm from the carina. Enteric tube in place, tip and side port below the diaphragm not included in the field of view.  Progressive opacity in the right upper lobe, now abutting the minor fissure. Development of retrocardiac opacity and obscuration of the hemidiaphragm. Increasing hazy opacity in the left midlung zone, partially obscured. Cardiomediastinal contours are unchanged, heart is mildly enlarged. Multiple overlying monitoring devices remain in place.  IMPRESSION: 1. Development of retrocardiac opacity and increasing hazy opacity in the left mid lung zone. This may reflect atelectasis or developing pulmonary edema. 2. Increasing right upper lobe opacity, and now all abutting the minor fissure. Pulmonary edema versus atelectasis again considered.   Electronically Signed   By: Rubye Oaks M.D.   On: 11/10/2014 04:34   Dg Chest Portable 1 View  11/10/2014   CLINICAL DATA:  Intubation.  Post CPR.  EXAM: PORTABLE CHEST - 1 VIEW  COMPARISON:  None.  FINDINGS: Endotracheal tube approximately 2.6 cm from the carina. Enteric tube in place, tip and side-port below the diaphragm not included in the field of view. Multiple overlying monitoring devices partially obscuring evaluation. Heart appears mildly enlarged. Lung volumes are low. Ill-defined hazy opacity in the right upper lung zone. Mild vascular congestion. No displaced rib fracture or large pneumothorax. No large pleural effusion.  IMPRESSION: 1. Endotracheal tube 2.6 cm from the carina.  Enteric tube in place. 2. Probable cardiomegaly. Ill-defined opacity in the right upper lung zone, may reflect asymmetric pulmonary edema, pneumonia or contusion given history of CPR. Multiple overlying monitoring devices partially obscure evaluation of the lung  parenchyma.   Electronically Signed   By: Rubye Oaks M.D.   On: 11/10/2014 01:05    ASSESSMENT AND PLAN  1. VF arrest 2. Non-ischemic CM 3. LBBB/IVCD QRS 140 4. New diagnosis systolic heart failure  Rec: he is improved. Neuro status is intact. Will tentatively plan ICD on Monday. With LBBB, will attempt BiV implant. He will need maximal medical therapy as well. I discussed this with the patient and  his wife.  Sudie Bandel,M.D.  11/14/2014 8:49 AM

## 2014-11-14 NOTE — Evaluation (Signed)
Clinical/Bedside Swallow Evaluation Patient Details  Name: Brian Noble MRN: [id number] Date of Birth: Nov 29, 1960  Today's Date: 11/14/2014 Time: SLP Start Time (ACUTE ONLY): 0805 SLP Stop Time (ACUTE ONLY): 0820 SLP Time Calculation (min) (ACUTE ONLY): 15 min  Past Medical History:  Past Medical History  Diagnosis Date  . Hypertension   . Chronic kidney disease   . Arthritis    Past Surgical History:  Past Surgical History  Procedure Laterality Date  . Cardiac catheterization N/A 11/10/2014    Procedure: Left Heart Cath and Coronary Angiography;  Surgeon: [name], MD;  Location: [facility] INVASIVE CV LAB;  Service: Cardiovascular;  Laterality: N/A;  . Appendectomy     HPI:  54 year old male who suffered a witnessed cardiac arrest at home. VF. Downtime reportedly less than 20 mins. Combative after ROSC. Intubated in ED and taken to cath lab where he was found to have clean coronary arteries.    Assessment / Plan / Recommendation Clinical Impression  Clinical swallowing evaluation was completed.  Vocal quality was hoarse given recent intubation.  Oral mechanism exam was negative for findings.  The patient's oral and pharyngeal swallow appeared to be functional.  Swallow trigger was timely and mastication was functional.  Initially the patient was noted to have double swallows with intake but this quickly resolved.  Overt s/s of aspiration were not observed and the patient had no changes to his respiratory status.  Recommend a regular diet and thin liquids.  The patient and his wife were cautioned to eat/drink slowly and to begin with smaller meal sizes.  ST to follow up for therapeutic diet tolerance given recent history of intubation.      Aspiration Risk  Mild    Diet Recommendation  (Regular diet and thin liquids.  )   Medication Administration: Whole meds with puree Compensations: Slow rate;Small sips/bites (smaller meals to begin with)    Other  Recommendations Oral Care  Recommendations: Oral care BID      Frequency and Duration min 2x/week  2 weeks   Pertinent Vitals/Pain Patient complains of pain in his chest area with coughing.       Swallow Study Prior Functional Status   Independent - living with his wife and working as a Programmer, multimedia.      General Date of Onset: 11/10/14 Other Pertinent Information: 54 year old male who suffered a witnessed cardiac arrest at home. VF. Downtime reportedly less than 20 mins. Combative after ROSC. Intubated in ED and taken to cath lab where he was found to have clean coronary arteries.  Type of Study: Bedside swallow evaluation Previous Swallow Assessment: None noted. Diet Prior to this Study: NPO Temperature Spikes Noted: Yes Respiratory Status: Supplemental O2 delivered via (comment) (nasal canula) History of Recent Intubation: Yes Length of Intubations (days): 4 days Date extubated: 11/13/14 Behavior/Cognition: Alert;Cooperative;Pleasant mood Oral Cavity - Dentition: Adequate natural dentition/normal for age (Pt has one tooth that is loose and causing some pain.  ) Self-Feeding Abilities: Able to feed self;Needs set up Patient Positioning: Upright in bed Baseline Vocal Quality: Hoarse Volitional Cough: Weak (Pt with pain in chest with coughing due to CPR.) Volitional Swallow: Able to elicit    Oral/Motor/Sensory Function Overall Oral Motor/Sensory Function: Appears within functional limits for tasks assessed Labial ROM: Within Functional Limits Labial Symmetry: Within Functional Limits Labial Strength: Within Functional Limits Lingual ROM: Within Functional Limits Lingual Symmetry: Within Functional Limits Lingual Strength: Within Functional Limits Facial ROM: Within Functional Limits Facial Symmetry:  Within Functional Limits Facial Strength: Within Functional Limits Mandible: Within Functional Limits   Ice Chips Ice chips: Within functional limits Presentation: Spoon   Thin Liquid Thin Liquid: Within  functional limits Presentation: Cup;Self Fed;Spoon    Nectar Thick Nectar Thick Liquid: Not tested   Honey Thick Honey Thick Liquid: Not tested   Puree Puree: Within functional limits Presentation: Self Fed;Spoon   Solid   GO    Solid: Within functional limits Presentation: Self Harlon Flor, Naquan Garman N 11/14/2014,8:27 AM  Dimas Aguas, MA, CCC-SLP Acute Rehab SLP [phone]

## 2014-11-14 NOTE — Progress Notes (Signed)
ANTIBIOTIC CONSULT NOTE - INITIAL  Pharmacy Consult for Unasyn Indication: r/o aspiration pneumonia  No Known Allergies  Assessment: 54 yo m admitted 8/23 after vfib arrest at home.  Pt is s/p arctic sun and there is concern for aspiration pneumonia.  Vanc and Zosyn d/c'ed this AM and patient is to transition back to Unasyn. Wbc 11.2, tmax 101.5, SCr 1.09, CrCl ~ 75 ml/min. CXR today shows persistent LLL opacities.  Zosyn 8/26 >> 8/27 Vanc 8/26 >> 8/27 Unasyn 8/23 >> 8/26, 8/27 >>  8/23 MRSA PCR neg 8/23 TA: normal flora 8/23 Bld Cx: NGx3D 8/26 Bld Cx: sent  Goal of Therapy:  Eradication of any infection  Plan:  Start Unasyn 3 gm IV q8h F/u cx, CBC, temperature curve, clinical course  Dalis Beers L. Roseanne Reno, PharmD PGY2 Infectious Diseases Pharmacy Resident Pager: [phone] 11/14/2014 10:51 AM

## 2014-11-14 NOTE — Progress Notes (Addendum)
PULMONARY / CRITICAL CARE MEDICINE   Name: Brian Noble MRN: [id number] DOB: Jan 13, 1961    ADMISSION DATE:  11/10/2014 CONSULTATION DATE:  11/10/2014  REFERRING MD :  EDP  CHIEF COMPLAINT:  Witnessed arrest  INITIAL PRESENTATION: 54 year old male who suffered a witnessed cardiac arrest at home. VF. Downtime reportedly less Noble 20 mins. Combative after ROSC. Intubated in ED and taken to cath lab where he was found to have clean coronary arteries. PCCM to see.   STUDIES:  8/23 LHC > 8/23 CT head > Questionable vague area decreased density in the anterior right temporal lobe. This may reflect a region of acute or subacute ischemia versus volume averaging.  SIGNIFICANT EVENTS: 8/23 witnessed arrest, Cath, to ICU. Hypothermia protocol initiated. 8/26- extubated 8/27- neuro intact  SUBJECTIVE:  Awake, alert, no distress, neg 2.1 liters  VITAL SIGNS: Temp:  [99.1 F (37.3 C)-101.5 F (38.6 C)] 99.5 F (37.5 C) (08/27 0700) Pulse Rate:  [66-125] 66 (08/27 0700) Resp:  [16-34] 18 (08/27 0700) BP: (115-206)/(55-143) 127/66 mmHg (08/27 0700) SpO2:  [97 %-100 %] 99 % (08/27 0700) Weight:  [81.7 kg (180 lb 1.9 oz)] 81.7 kg (180 lb 1.9 oz) (08/27 0700) HEMODYNAMICS:   VENTILATOR SETTINGS:   INTAKE / OUTPUT:  Intake/Output Summary (Last 24 hours) at 11/14/14 1030 Last data filed at 11/14/14 0700  Gross per 24 hour  Intake 1959.5 ml  Output   4025 ml  Net -2065.5 ml   PHYSICAL EXAMINATION: General:  Awake, alert Neuro:  Awake, alert, nonfocal, good speech HEENT:  PERRL Cardiovascular:  RRR, no MRG Lungs: CTA reduced Abdomen:  Soft, non-tender, non-distended, no r/g Musculoskeletal:  No acute deformity or ROM limitation.  Skin:  Grossly intact  LABS:  CBC  Recent Labs Lab 11/11/14 0520 11/11/14 1143 11/12/14 0219 11/13/14 0400  WBC 7.5  --  9.3 11.2*  HGB 14.3 14.6 13.0 12.4*  HCT 41.9 43.0 38.4* 37.4*  PLT 178  --  179 182   Coag's  Recent Labs Lab  11/10/14 0105 11/10/14 0505 11/10/14 1145  APTT 30 30 31   INR 1.27 1.40 1.30   BMET  Recent Labs Lab 11/12/14 0219 11/13/14 0400 11/14/14 0443  NA 139 139 137  K 3.6 3.8 3.8  CL 110 105 103  CO2 21* 26 26  BUN 13 13 14   CREATININE 1.12 1.10 1.09  GLUCOSE 86 78 91   Electrolytes  Recent Labs Lab 11/11/14 0520 11/12/14 0219 11/13/14 0400 11/14/14 0443  CALCIUM 7.6* 7.9* 8.4* 8.4*  MG 1.7 1.7  --  2.0  PHOS 2.5 3.0  --  4.3   Sepsis Markers  Recent Labs Lab 11/10/14 0556 11/10/14 1125  LATICACIDVEN 2.0 1.75   ABG  Recent Labs Lab 11/11/14 1330 11/12/14 0337 11/12/14 0439  PHART 7.412 7.460* 7.422  PCO2ART 28.2* 29.5* 29.4*  PO2ART 76.0* 85.8 81.0   Liver Enzymes  Recent Labs Lab 11/10/14 0105 11/13/14 0400  AST 242* 97*  ALT 314* 145*  ALKPHOS 50 51  BILITOT 1.0 1.1  ALBUMIN 3.1* 3.2*   Cardiac Enzymes  Recent Labs Lab 11/10/14 0505 11/10/14 1500  TROPONINI 0.13* 0.56*   Glucose  Recent Labs Lab 11/13/14 1425 11/13/14 1650 11/13/14 2022 11/13/14 2342 11/14/14 0316 11/14/14 0726  GLUCAP 77 88 86 96 85 93    Imaging Dg Chest Port 1 View  11/14/2014   CLINICAL DATA:  Pneumonia.  EXAM: PORTABLE CHEST - 1 VIEW  COMPARISON:  11/13/2014 and 11/12/2014.  FINDINGS: 0526 hours. Interval extubation with removal of the nasogastric tube. Left IJ central venous catheter appears unchanged at the mid SVC level. The overall pulmonary aeration has slightly improved. There are persistent bilateral perihilar and retrocardiac pulmonary opacities without consolidation. No significant pleural effusion. The heart size and mediastinal contours are stable.  IMPRESSION: Slight improvement in overall aeration following extubation. Persistent perihilar and left lower lobe airspace opacities.   Electronically Signed   By: Carey Bullocks M.D.   On: 11/14/2014 09:10   ASSESSMENT / PLAN:  PULMONARY OETT 8/23 >>> A: Acute hypoxemic respiratory failure in  setting cardiac arrest - improved ?Aspiration pneumonitis Improving int changes pulm edema P:   IS Maintain lasix  CARDIOVASCULAR CVL R fem 8/23 >8/23 L IJ TLC 8/23>>> A:  Cardiogenic shock S/p VF arrest Acute systolic CHF H/o HTN  P:  Telemetry monitoring MAP goal > 80mm/Hg ICD Monday per EP Dc line  RENAL A:   AKI Tolerating lasix very well P:   bmet in am  kvo Lasix continue Low threshold dc acei, crt stable since it went up 3 days prior  GASTROINTESTINAL A:   Mild transaminase elevation P:   diet passed swallow PPI  HEMATOLOGIC A:   Anemia DVt prevention P:  Limit phebotomy Heparin SQ, dc once ambulation  INFECTIOUS A:   Concern aspiration PNA  P:   BCx2 8/23 >>> Sputum 8/23 >>>NF  Zosyn >>>dc Vanc>>>dc Unasyn 8/23 >>>  Transition back to unasyn  ENDOCRINE A:   DM Hypoglcyemia  P:   LFt wnl overall CBG monitoring, D10, maintain, dc if glu greater 180 x 2  NEUROLOGIC A:   Acute anoxic encephalopathy - imrpoved ?Area of ischemia/CVA  P:   prop / fent wua May need repeat imaging, eval gait etc  FAMILY  - Updates: wife updated 8/25 and today  - Inter-disciplinary family meet or Palliative Care meeting due by:  9/1  To sdu, triad   Mcarthur Rossetti. Tyson Alias, MD, FACP Pgr: [phone] [facility]

## 2014-11-15 ENCOUNTER — Encounter (HOSPITAL_COMMUNITY): Payer: Self-pay

## 2014-11-15 DIAGNOSIS — I4901 Ventricular fibrillation: Principal | ICD-10-CM

## 2014-11-15 LAB — BASIC METABOLIC PANEL
Anion gap: 11 (ref 5–15)
BUN: 11 mg/dL (ref 6–20)
CO2: 30 mmol/L (ref 22–32)
Calcium: 8.7 mg/dL — ABNORMAL LOW (ref 8.9–10.3)
Chloride: 100 mmol/L — ABNORMAL LOW (ref 101–111)
Creatinine, Ser: 1.1 mg/dL (ref 0.61–1.24)
GFR calc Af Amer: >60 mL/min (ref >60)
GFR calc non Af Amer: >60 mL/min (ref >60)
Glucose, Bld: 99 mg/dL (ref 65–99)
Potassium: 3.1 mmol/L — ABNORMAL LOW (ref 3.5–5.1)
Sodium: 141 mmol/L (ref 135–145)

## 2014-11-15 LAB — CBC
HCT: 36.3 % — ABNORMAL LOW (ref 39.0–52.0)
Hemoglobin: 12 g/dL — ABNORMAL LOW (ref 13.0–17.0)
MCH: 29.2 pg (ref 26.0–34.0)
MCHC: 33.1 g/dL (ref 30.0–36.0)
MCV: 88.3 fL (ref 78.0–100.0)
Platelets: 191 10*3/uL (ref 150–400)
RBC: 4.11 MIL/uL — ABNORMAL LOW (ref 4.22–5.81)
RDW: 13.4 % (ref 11.5–15.5)
WBC: 7.4 10*3/uL (ref 4.0–10.5)

## 2014-11-15 LAB — CULTURE, BLOOD (ROUTINE X 2)
Culture: NO GROWTH
Culture: NO GROWTH

## 2014-11-15 LAB — GLUCOSE, CAPILLARY
Glucose-Capillary: 98 mg/dL (ref 65–99)
Glucose-Capillary: 101 mg/dL — ABNORMAL HIGH (ref 65–99)
Glucose-Capillary: 99 mg/dL (ref 65–99)
Glucose-Capillary: 103 mg/dL — ABNORMAL HIGH (ref 65–99)
Glucose-Capillary: 99 mg/dL (ref 65–99)
Glucose-Capillary: 103 mg/dL — ABNORMAL HIGH (ref 65–99)

## 2014-11-15 MED ORDER — SODIUM CHLORIDE 0.9 % IV SOLN: INTRAVENOUS | Status: DC

## 2014-11-15 MED ORDER — CEFAZOLIN SODIUM-DEXTROSE 2-3 GM-% IV SOLR
2.0000 g | INTRAVENOUS | Status: DC
Start: 2014-11-16 — End: 2014-11-16

## 2014-11-15 MED ORDER — CARVEDILOL 3.125 MG PO TABS
3.1250 mg | ORAL_TABLET | Freq: Two times a day (BID) | ORAL | Status: DC
  Administered 2014-11-15 – 2014-11-17 (×5): 3.125 mg via ORAL
  Filled 2014-11-15 (×5): qty 1

## 2014-11-15 MED ORDER — SODIUM CHLORIDE 0.9 % IR SOLN
80.0000 mg | Status: DC
  Filled 2014-11-15: qty 2

## 2014-11-15 MED ORDER — DEXTROSE-NACL 5-0.9 % IV SOLN
INTRAVENOUS | Status: DC
  Administered 2014-11-15: 11:00:00 via INTRAVENOUS

## 2014-11-15 MED ORDER — POTASSIUM CHLORIDE CRYS ER 20 MEQ PO TBCR
40.0000 meq | EXTENDED_RELEASE_TABLET | ORAL | Status: AC
  Administered 2014-11-15 (×2): 40 meq via ORAL
  Filled 2014-11-15 (×2): qty 2

## 2014-11-15 NOTE — Progress Notes (Signed)
Pt transferred to 3W per MD order. NAD, VSS, wife at bedside for transfer.

## 2014-11-15 NOTE — Progress Notes (Signed)
Patient ID: ROTH RESS, male   DOB: 1961-01-06, 54 y.o.   MRN: [id number]    Patient Name: Brian Noble Date of Encounter: 11/15/2014     Active Problems:   Sudden cardiac death   Cardiac arrest   Acute respiratory failure with hypoxia   Hypoxia   Cardiogenic shock   Central line clotted    SUBJECTIVE  No chest pain or sob.   CURRENT MEDS . ampicillin-sulbactam (UNASYN) IV  3 g Intravenous Q8H  . aspirin  81 mg Oral Daily  . dextrose  25 mL Intravenous Once  . famotidine (PEPCID) IV  20 mg Intravenous Q12H  . furosemide  40 mg Intravenous Q12H  . heparin  5,000 Units Subcutaneous 3 times per day  . lisinopril  10 mg Oral Daily  . potassium chloride  20 mEq Oral Daily    OBJECTIVE  Filed Vitals:   11/15/14 0200 11/15/14 0335 11/15/14 0733 11/15/14 0800  BP: 137/68 122/90 131/96 134/88  Pulse:      Temp:  99.2 F (37.3 C) 98.6 F (37 C)   TempSrc:  Oral Oral   Resp:  18 17   Height:      Weight:  174 lb 9.7 oz (79.2 kg)    SpO2:  95% 96%     Intake/Output Summary (Last 24 hours) at 11/15/14 0951 Last data filed at 11/15/14 0800  Gross per 24 hour  Intake   2440 ml  Output   2475 ml  Net    -35 ml   Filed Weights   11/13/14 0334 11/14/14 0700 11/15/14 0335  Weight: 182 lb 1.6 oz (82.6 kg) 180 lb 1.9 oz (81.7 kg) 174 lb 9.7 oz (79.2 kg)    PHYSICAL EXAM  General: Pleasant, NAD. Neuro: Alert and oriented X 3. Moves all extremities spontaneously.  Psych: Normal affect. HEENT:  Normal  Neck: Supple without bruits or JVD. Lungs:  Resp regular and unlabored, CTA. Heart: RRR no s3, s4, or murmurs. Abdomen: Soft, non-tender, non-distended, BS + x 4.  Extremities: No clubbing, cyanosis or edema. DP/PT/Radials 2+ and equal bilaterally.  Accessory Clinical Findings  CBC  Recent Labs  11/13/14 0400 11/15/14 0345  WBC 11.2* 7.4  NEUTROABS 9.1*  --   HGB 12.4* 12.0*  HCT 37.4* 36.3*  MCV 87.8 88.3  PLT 182 191   Basic Metabolic Panel  Recent  Labs  40/98/11 0443 11/15/14 0345  NA 137 141  K 3.8 3.1*  CL 103 100*  CO2 26 30  GLUCOSE 91 99  BUN 14 11  CREATININE 1.09 1.10  CALCIUM 8.4* 8.7*  MG 2.0  --   PHOS 4.3  --    Liver Function Tests  Recent Labs  11/13/14 0400  AST 97*  ALT 145*  ALKPHOS 51  BILITOT 1.1  PROT 6.3*  ALBUMIN 3.2*   No results for input(s): LIPASE, AMYLASE in the last 72 hours. Cardiac Enzymes No results for input(s): CKTOTAL, CKMB, CKMBINDEX, TROPONINI in the last 72 hours. BNP Invalid input(s): POCBNP D-Dimer No results for input(s): DDIMER in the last 72 hours. Hemoglobin A1C No results for input(s): HGBA1C in the last 72 hours. Fasting Lipid Panel  Recent Labs  11/12/14 1100  TRIG 165*   Thyroid Function Tests No results for input(s): TSH, T4TOTAL, T3FREE, THYROIDAB in the last 72 hours.  Invalid input(s): FREET3  TELE  nsr  Radiology/Studies  Ct Head Wo Contrast  11/10/2014   CLINICAL DATA:  Left-sided weakness.  Post cardiac arrest and CPR.  EXAM: CT HEAD WITHOUT CONTRAST  TECHNIQUE: Contiguous axial images were obtained from the base of the skull through the vertex without intravenous contrast.  COMPARISON:  None.  FINDINGS: Questionable vague area decreased density in the anterior right temporal lobe without mass effect. No intracranial hemorrhage or midline shift. No hydrocephalus. The basilar cisterns are patent. No cerebral edema. No intracranial fluid collection. Calvarium is intact. Minimal opacification of ethmoid air cells and small fluid levels in the sphenoid sinus, may be related to intubation. The mastoid air cells are well aerated.  IMPRESSION: Questionable vague area decreased density in the anterior right temporal lobe. This may reflect a region of acute or subacute ischemia versus volume averaging. There is no hemorrhage or mass effect.  These results were called by telephone at the time of interpretation on 11/10/2014 at 2:56 am to Dr. Loren Racer , who  verbally acknowledged these results.   Electronically Signed   By: Rubye Oaks M.D.   On: 11/10/2014 02:56   Dg Chest Port 1 View  11/14/2014   CLINICAL DATA:  Pneumonia.  EXAM: PORTABLE CHEST - 1 VIEW  COMPARISON:  11/13/2014 and 11/12/2014.  FINDINGS: 0526 hours. Interval extubation with removal of the nasogastric tube. Left IJ central venous catheter appears unchanged at the mid SVC level. The overall pulmonary aeration has slightly improved. There are persistent bilateral perihilar and retrocardiac pulmonary opacities without consolidation. No significant pleural effusion. The heart size and mediastinal contours are stable.  IMPRESSION: Slight improvement in overall aeration following extubation. Persistent perihilar and left lower lobe airspace opacities.   Electronically Signed   By: Carey Bullocks M.D.   On: 11/14/2014 09:10   Dg Chest Port 1 View  11/13/2014   CLINICAL DATA:  Pneumonia  EXAM: PORTABLE CHEST - 1 VIEW  COMPARISON:  11/12/2014  FINDINGS: Endotracheal tube, nasogastric catheter and left jugular central line are again seen and stable. The overall inspiratory effort is poor. Vascular crowding is noted centrally. Mild persistent left basilar atelectasis is noted. The cardiac shadow is stable.  IMPRESSION: Poor inspiratory effort.  Persistent left basilar atelectasis.   Electronically Signed   By: Alcide Clever M.D.   On: 11/13/2014 07:02   Dg Chest Port 1 View  11/12/2014   CLINICAL DATA:  Respiratory failure, cardiac arrest, intubated patient.  EXAM: PORTABLE CHEST - 1 VIEW  COMPARISON:  Portable chest x-ray of November 11, 2014  FINDINGS: The lungs are reasonably well inflated. The right hemidiaphragm remains higher than the left. The retrocardiac region on the left remains dense. Interstitial density in the right upper lobe is less conspicuous today. The cardiac silhouette is top-normal in size. The central pulmonary vascularity is less engorged.  The endotracheal tube tip lies 4 cm  above the carina. The esophagogastric tube tip projects below the inferior margin of the image. The left internal jugular venous catheter tip projects over the junction of the proximal and midportions of the SVC.  IMPRESSION: Slight interval improvement in interstitial edema. Left lower lobe atelectasis persists. The support tubes are in reasonable position.   Electronically Signed   By: David  [place] M.D.   On: 11/12/2014 07:13   Dg Chest Port 1 View  11/11/2014   CLINICAL DATA:  Cardiac arrest, acute respiratory failure, intubated patient.  EXAM: PORTABLE CHEST - 1 VIEW  COMPARISON:  Portable chest x-ray of November 10, 2014  FINDINGS: The lungs are adequately inflated. There remain mildly increased interstitial densities in the  upper perihilar regions. These are less conspicuous today. The cardiac silhouette is normal in size. The pulmonary vascularity is less engorged. There are external pacing and defibrillator pads present. The endotracheal tube tip lies 5.4 cm above the carina. The esophagogastric tube tip projects below the inferior margin of the image. A new left internal jugular venous catheter tip projects over the midportion of the SVC  IMPRESSION: Interval improvement in the appearance of the pulmonary interstitium consistent with resolving interstitial edema. There is persistent subsegmental left lower lobe atelectasis. The support tubes are in reasonable position.   Electronically Signed   By: David  [place] M.D.   On: 11/11/2014 07:10   Dg Chest Port 1 View  11/10/2014   CLINICAL DATA:  Clotted central line, initial encounter  EXAM: PORTABLE CHEST - 1 VIEW  COMPARISON:  Portable exam 1320 hours compared to 0429 hours  FINDINGS: Tip of endotracheal tube projects 4.9 cm above carina.  Nasogastric tube extends into stomach.  LEFT jugular central venous catheter tip projects over SVC.  External pacing leads in EKG leads project over chest.  Upper normal size of cardiac silhouette.  Persistent  perihilar infiltrates greatest in RIGHT upper lobe and LEFT lower lobe, question edema versus infection.  No pleural effusion or pneumothorax.  IMPRESSION: Persistent pulmonary infiltrates.  Stable line and tube positions.  Little interval change.   Electronically Signed   By: Ulyses Southward M.D.   On: 11/10/2014 13:28   Dg Chest Port 1 View  11/10/2014   CLINICAL DATA:  Hypoxia.  EXAM: PORTABLE CHEST - 1 VIEW  COMPARISON:  Earlier today at 0047 hour  FINDINGS: 0429 hour: Endotracheal tube is approximately 4.0 cm from the carina. Enteric tube in place, tip and side port below the diaphragm not included in the field of view.  Progressive opacity in the right upper lobe, now abutting the minor fissure. Development of retrocardiac opacity and obscuration of the hemidiaphragm. Increasing hazy opacity in the left midlung zone, partially obscured. Cardiomediastinal contours are unchanged, heart is mildly enlarged. Multiple overlying monitoring devices remain in place.  IMPRESSION: 1. Development of retrocardiac opacity and increasing hazy opacity in the left mid lung zone. This may reflect atelectasis or developing pulmonary edema. 2. Increasing right upper lobe opacity, and now all abutting the minor fissure. Pulmonary edema versus atelectasis again considered.   Electronically Signed   By: Rubye Oaks M.D.   On: 11/10/2014 04:34   Dg Chest Portable 1 View  11/10/2014   CLINICAL DATA:  Intubation.  Post CPR.  EXAM: PORTABLE CHEST - 1 VIEW  COMPARISON:  None.  FINDINGS: Endotracheal tube approximately 2.6 cm from the carina. Enteric tube in place, tip and side-port below the diaphragm not included in the field of view. Multiple overlying monitoring devices partially obscuring evaluation. Heart appears mildly enlarged. Lung volumes are low. Ill-defined hazy opacity in the right upper lung zone. Mild vascular congestion. No displaced rib fracture or large pneumothorax. No large pleural effusion.  IMPRESSION: 1.  Endotracheal tube 2.6 cm from the carina.  Enteric tube in place. 2. Probable cardiomegaly. Ill-defined opacity in the right upper lung zone, may reflect asymmetric pulmonary edema, pneumonia or contusion given history of CPR. Multiple overlying monitoring devices partially obscure evaluation of the lung parenchyma.   Electronically Signed   By: Rubye Oaks M.D.   On: 11/10/2014 01:05    ASSESSMENT AND PLAN  1. VF arrest - s/p rescucitation 2. Anoxic encephalopathy - now resolved. Neuro status is back  to baseline 3. Non-ischemic CM - EF 10% by cath. Patient states that his [place] cardiologist told him " your heart is doing ok but you might want a defibrillator some day" implying to me that his cardiomyopathy is not new. In setting of LBBB/IVCD 140 ms, will consider BiV ICD, although his history suggests that he had minimal heart failure symptoms prior to his event. I have informed him that he is not supposed to drive for 6 months.  Ginny Loomer,M.D.  11/15/2014 9:51 AM

## 2014-11-15 NOTE — Progress Notes (Signed)
PULMONARY / CRITICAL CARE MEDICINE   Name: Brian Noble MRN: [id number] DOB: 1961-01-17    ADMISSION DATE:  11/10/2014 CONSULTATION DATE:  11/10/2014  REFERRING MD :  EDP  CHIEF COMPLAINT:  Witnessed arrest  INITIAL PRESENTATION: 54 year old male who suffered a witnessed cardiac arrest at home. VF. Downtime reportedly less than 20 mins. Combative after ROSC. Intubated in ED and taken to cath lab where he was found to have clean coronary arteries. PCCM to see.   STUDIES:  8/23 LHC > 8/23 CT head > Questionable vague area decreased density in the anterior right temporal lobe. This may reflect a region of acute or subacute ischemia versus volume averaging.  SIGNIFICANT EVENTS: 8/23 witnessed arrest, Cath, to ICU. Hypothermia protocol initiated. 8/26- extubated 8/27- neuro intact  SUBJECTIVE:  No distress  VITAL SIGNS: Temp:  [98.6 F (37 C)-100 F (37.8 C)] 98.6 F (37 C) (08/28 0733) Pulse Rate:  [78-89] 89 (08/27 2030) Resp:  [16-25] 17 (08/28 0733) BP: (122-150)/(68-96) 134/88 mmHg (08/28 0800) SpO2:  [93 %-97 %] 96 % (08/28 0733) Weight:  [79.2 kg (174 lb 9.7 oz)] 79.2 kg (174 lb 9.7 oz) (08/28 0335) HEMODYNAMICS:   VENTILATOR SETTINGS:   INTAKE / OUTPUT:  Intake/Output Summary (Last 24 hours) at 11/15/14 1053 Last data filed at 11/15/14 0800  Gross per 24 hour  Intake   2110 ml  Output   2300 ml  Net   -190 ml   PHYSICAL EXAMINATION: General:  Awake, alert Neuro:  Awake, alert, nonfocal HEENT:  PERRL Cardiovascular:  RRR, no MRG Lungs: CTA Abdomen:  Soft, non-tender, non-distended, no r/g Musculoskeletal:  No acute deformity or ROM limitation.  Skin:  Grossly intact  LABS:  CBC  Recent Labs Lab 11/12/14 0219 11/13/14 0400 11/15/14 0345  WBC 9.3 11.2* 7.4  HGB 13.0 12.4* 12.0*  HCT 38.4* 37.4* 36.3*  PLT 179 182 191   Coag's  Recent Labs Lab 11/10/14 0105 11/10/14 0505 11/10/14 1145  APTT INR 1.27 1.40 1.30   BMET  Recent  Labs Lab 11/13/14 0400 11/14/14 0443 11/15/14 0345  NA 139 137 141  K 3.8 3.8 3.1*  CL 105 103 100*  CO2 BUN CREATININE 1.10 1.09 1.10  GLUCOSE 78 91 99   Electrolytes  Recent Labs Lab 11/11/14 0520 11/12/14 0219 11/13/14 0400 11/14/14 0443 11/15/14 0345  CALCIUM 7.6* 7.9* 8.4* 8.4* 8.7*  MG 1.7 1.7  --  2.0  --   PHOS 2.5 3.0  --  4.3  --    Sepsis Markers  Recent Labs Lab 11/10/14 0556 11/10/14 1125  LATICACIDVEN 2.0 1.75   ABG  Recent Labs Lab 11/11/14 1330 11/12/14 0337 11/12/14 0439  PHART 7.412 7.460* 7.422  PCO2ART 28.2* 29.5* 29.4*  PO2ART 76.0* 85.8 81.0   Liver Enzymes  Recent Labs Lab 11/10/14 0105 11/13/14 0400  AST 242* 97*  ALT 314* 145*  ALKPHOS 50 51  BILITOT 1.0 1.1  ALBUMIN 3.1* 3.2*   Cardiac Enzymes  Recent Labs Lab 11/10/14 0505 11/10/14 1500  TROPONINI 0.13* 0.56*   Glucose  Recent Labs Lab 11/14/14 1329 11/14/14 1745 11/14/14 2033 11/14/14 2322 11/15/14 0353 11/15/14 0735  GLUCAP 121* 75 119* 98 101* 99    Imaging No results found. ASSESSMENT / PLAN:  PULMONARY OETT 8/23 >>> A: Acute hypoxemic respiratory failure in setting cardiac arrest - improved ?Aspiration pneumonitis Improving int changes pulm edema P:  IS Even to neg balance goals, lasix  CARDIOVASCULAR CVL R fem 8/23 >8/23 L IJ TLC 8/23>>> A:  Cardiogenic shock S/p VF arrest Acute systolic CHF H/o HTN  P:  Telemetry monitoring - keep ICD Monday per EP EP note reviewed lasix  RENAL A:   AKI Tolerating lasix very well hypokalemia P:   bmet in am  kvo Lasix may need increase to neg balance, as was even last 24 hours overall, although not resp symptoms, keep same dose  GASTROINTESTINAL A:   Mild transaminase elevation P:   diet passed swallow Pepcid, dc if not home med  HEMATOLOGIC A:   Anemia DVt prevention P:  Limit phebotomy Heparin SQ, dc as dancing in hallway   INFECTIOUS A:    Concern aspiration PNA  P:   BCx2 8/23 >>> Sputum 8/23 >>>NF  Zosyn >>>dc Vanc>>>dc Unasyn 8/23 >>>  Unasyn, add total days abx stop date 7 days  ENDOCRINE A:   DM Hypoglcyemia improved On full diet  P:   LFt wnl overall CBG monitoring, change d10 to d5  NEUROLOGIC A:   Acute anoxic encephalopathy - imrpoved ?Area of ischemia/CVA  P:   PT active May need repeat imaging, eval gait -nomrla, no need  FAMILY  - Updates: wife updated 8/25 and today  - Inter-disciplinary family meet or Palliative Care meeting due by:  9/1  To tele, triad  Mcarthur Rossetti. Tyson Alias, MD, FACP Pgr: [phone] [facility]

## 2014-11-15 NOTE — Progress Notes (Addendum)
Removed LIJTL central line. Held pressure for 10 minutes.  Pt tolerated well. Vs stable .   Catheter tip intact. Site level 0.  Pressure dressing applied with Vaseline gauze.  Pt educated.  Call bell in reach.

## 2014-11-16 ENCOUNTER — Encounter (HOSPITAL_COMMUNITY)
Admission: EM | Disposition: A | Discharge status: Home Health | Payer: Self-pay | Source: Home / Self Care | Attending: Emergency Medicine

## 2014-11-16 ENCOUNTER — Encounter (HOSPITAL_COMMUNITY): Payer: Self-pay | Admitting: Anesthesiology

## 2014-11-16 ENCOUNTER — Encounter (HOSPITAL_COMMUNITY): Payer: Self-pay | Admitting: Internal Medicine

## 2014-11-16 DIAGNOSIS — I4901 Ventricular fibrillation: Secondary | ICD-10-CM

## 2014-11-16 DIAGNOSIS — I420 Dilated cardiomyopathy: Secondary | ICD-10-CM

## 2014-11-16 DIAGNOSIS — J690 Pneumonitis due to inhalation of food and vomit: Secondary | ICD-10-CM | POA: Insufficient documentation

## 2014-11-16 HISTORY — PX: EP IMPLANTABLE DEVICE: SHX172B

## 2014-11-16 LAB — GLUCOSE, CAPILLARY
Glucose-Capillary: 83 mg/dL (ref 65–99)
Glucose-Capillary: 83 mg/dL (ref 65–99)
Glucose-Capillary: 139 mg/dL — ABNORMAL HIGH (ref 65–99)
Glucose-Capillary: 94 mg/dL (ref 65–99)

## 2014-11-16 LAB — BASIC METABOLIC PANEL
Anion gap: 10 (ref 5–15)
BUN: 16 mg/dL (ref 6–20)
CO2: 29 mmol/L (ref 22–32)
Calcium: 8.8 mg/dL — ABNORMAL LOW (ref 8.9–10.3)
Chloride: 101 mmol/L (ref 101–111)
Creatinine, Ser: 1.05 mg/dL (ref 0.61–1.24)
GFR calc Af Amer: >60 mL/min (ref >60)
GFR calc non Af Amer: >60 mL/min (ref >60)
Glucose, Bld: 91 mg/dL (ref 65–99)
Potassium: 3.6 mmol/L (ref 3.5–5.1)
Sodium: 140 mmol/L (ref 135–145)

## 2014-11-16 LAB — MAGNESIUM: Magnesium: 1.8 mg/dL (ref 1.7–2.4)

## 2014-11-16 LAB — PHOSPHORUS: Phosphorus: 3.8 mg/dL (ref 2.5–4.6)

## 2014-11-16 SURGERY — ICD IMPLANT

## 2014-11-16 MED ORDER — MIDAZOLAM HCL 5 MG/5ML IJ SOLN
INTRAMUSCULAR | Status: AC
  Filled 2014-11-16: qty 25

## 2014-11-16 MED ORDER — LIDOCAINE HCL (PF) 1 % IJ SOLN
INTRAMUSCULAR | Status: AC
  Filled 2014-11-16: qty 30

## 2014-11-16 MED ORDER — SODIUM CHLORIDE 0.9 % IR SOLN
Status: AC
  Filled 2014-11-16: qty 2

## 2014-11-16 MED ORDER — HEPARIN (PORCINE) IN NACL 2-0.9 UNIT/ML-% IJ SOLN
INTRAMUSCULAR | Status: AC
  Filled 2014-11-16: qty 500

## 2014-11-16 MED ORDER — SODIUM CHLORIDE 0.9 % IV SOLN
INTRAVENOUS | Status: AC
  Administered 2014-11-16: 16:00:00 via INTRAVENOUS

## 2014-11-16 MED ORDER — IOHEXOL 350 MG/ML SOLN
INTRAVENOUS | Status: DC | PRN
  Administered 2014-11-16: 10 mL via INTRAVENOUS

## 2014-11-16 MED ORDER — ACETAMINOPHEN 325 MG PO TABS: 325.0000 mg | ORAL_TABLET | ORAL | Status: DC | PRN

## 2014-11-16 MED ORDER — FENTANYL CITRATE (PF) 100 MCG/2ML IJ SOLN
INTRAMUSCULAR | Status: AC
  Filled 2014-11-16: qty 4

## 2014-11-16 MED ORDER — LIDOCAINE HCL (PF) 1 % IJ SOLN
INTRAMUSCULAR | Status: DC | PRN
  Administered 2014-11-16: 14:00:00

## 2014-11-16 MED ORDER — POTASSIUM CHLORIDE 20 MEQ/15ML (10%) PO SOLN
40.0000 meq | Freq: Every day | ORAL | Status: DC
  Administered 2014-11-16 – 2014-11-17 (×2): 40 meq via ORAL
  Filled 2014-11-16 (×2): qty 30

## 2014-11-16 MED ORDER — GENTAMICIN SULFATE 40 MG/ML IJ SOLN
INTRAMUSCULAR | Status: DC | PRN
  Administered 2014-11-16: 14:00:00

## 2014-11-16 MED ORDER — ONDANSETRON HCL 4 MG/2ML IJ SOLN: 4.0000 mg | Freq: Four times a day (QID) | INTRAMUSCULAR | Status: DC | PRN

## 2014-11-16 MED ORDER — FUROSEMIDE 40 MG PO TABS
40.0000 mg | ORAL_TABLET | Freq: Every day | ORAL | Status: DC
  Administered 2014-11-17: 40 mg via ORAL
  Filled 2014-11-16: qty 1

## 2014-11-16 MED ORDER — MAGNESIUM CHLORIDE 64 MG PO TBEC
1.0000 | DELAYED_RELEASE_TABLET | Freq: Two times a day (BID) | ORAL | Status: DC
  Administered 2014-11-16 – 2014-11-17 (×3): 64 mg via ORAL
  Filled 2014-11-16 (×5): qty 1

## 2014-11-16 MED ORDER — FENTANYL CITRATE (PF) 100 MCG/2ML IJ SOLN
INTRAMUSCULAR | Status: DC | PRN
Start: 2014-11-16 — End: 2014-11-16
  Administered 2014-11-16: 25 ug via INTRAVENOUS
  Administered 2014-11-16: 50 ug via INTRAVENOUS

## 2014-11-16 MED ORDER — INFLUENZA VAC SPLIT QUAD 0.5 ML IM SUSY: 0.5000 mL | PREFILLED_SYRINGE | INTRAMUSCULAR | Status: DC

## 2014-11-16 MED ORDER — LIDOCAINE HCL (PF) 1 % IJ SOLN
INTRAMUSCULAR | Status: DC | PRN
  Administered 2014-11-16: 56 mL

## 2014-11-16 MED ORDER — MIDAZOLAM HCL 5 MG/5ML IJ SOLN
INTRAMUSCULAR | Status: DC | PRN
  Administered 2014-11-16: 2 mg via INTRAVENOUS

## 2014-11-16 SURGICAL SUPPLY — 11 items
CABLE SURGICAL S-101-97-12 (CABLE) ×2 IMPLANT
ICD VISIA AF VR DVAB1D4 (ICD Generator) ×1 IMPLANT
KIT ESSENTIALS PG (KITS) ×2 IMPLANT
LEAD SPRINT QUAT SEC 6935M-62 (Lead) ×2 IMPLANT
PAD DEFIB LIFELINK (PAD) ×2 IMPLANT
SHEATH CLASSIC 7F (SHEATH) ×2 IMPLANT
SHEATH CLASSIC 9.5F (SHEATH) ×2 IMPLANT
SHEATH CLASSIC 9F (SHEATH) ×2 IMPLANT
SHIELD RADPAD SCOOP 12X17 (MISCELLANEOUS) ×2 IMPLANT
TRAY PACEMAKER INSERTION (CUSTOM PROCEDURE TRAY) ×2 IMPLANT
VISIA AF VR DVAB1D4 (ICD Generator) ×2 IMPLANT

## 2014-11-16 NOTE — Progress Notes (Signed)
SLP Cancellation Note  Patient Details Name: BREYLON MERKLINGER MRN: [id number] DOB: 02/21/1961   Cancelled treatment:       Reason Eval/Treat Not Completed: Medical issues which prohibited therapy (pt NPO pending procedure). Will continue to follow.   Maxcine Ham, M.A. CCC-SLP [phone]  Maxcine Ham 11/16/2014, 10:45 AM

## 2014-11-16 NOTE — H&P (View-Only) (Signed)
Patient ID: ROTH RESS, male   DOB: 1961-01-06, 54 y.o.   MRN: [id number]    Patient Name: Brian Noble Date of Encounter: 11/15/2014     Active Problems:   Sudden cardiac death   Cardiac arrest   Acute respiratory failure with hypoxia   Hypoxia   Cardiogenic shock   Central line clotted    SUBJECTIVE  No chest pain or sob.   CURRENT MEDS . ampicillin-sulbactam (UNASYN) IV  3 g Intravenous Q8H  . aspirin  81 mg Oral Daily  . dextrose  25 mL Intravenous Once  . famotidine (PEPCID) IV  20 mg Intravenous Q12H  . furosemide  40 mg Intravenous Q12H  . heparin  5,000 Units Subcutaneous 3 times per day  . lisinopril  10 mg Oral Daily  . potassium chloride  20 mEq Oral Daily    OBJECTIVE  Filed Vitals:   11/15/14 0200 11/15/14 0335 11/15/14 0733 11/15/14 0800  BP: 137/68 122/90 131/96 134/88  Pulse:      Temp:  99.2 F (37.3 C) 98.6 F (37 C)   TempSrc:  Oral Oral   Resp:  18 17   Height:      Weight:  174 lb 9.7 oz (79.2 kg)    SpO2:  95% 96%     Intake/Output Summary (Last 24 hours) at 11/15/14 0951 Last data filed at 11/15/14 0800  Gross per 24 hour  Intake   2440 ml  Output   2475 ml  Net    -35 ml   Filed Weights   11/13/14 0334 11/14/14 0700 11/15/14 0335  Weight: 182 lb 1.6 oz (82.6 kg) 180 lb 1.9 oz (81.7 kg) 174 lb 9.7 oz (79.2 kg)    PHYSICAL EXAM  General: Pleasant, NAD. Neuro: Alert and oriented X 3. Moves all extremities spontaneously.  Psych: Normal affect. HEENT:  Normal  Neck: Supple without bruits or JVD. Lungs:  Resp regular and unlabored, CTA. Heart: RRR no s3, s4, or murmurs. Abdomen: Soft, non-tender, non-distended, BS + x 4.  Extremities: No clubbing, cyanosis or edema. DP/PT/Radials 2+ and equal bilaterally.  Accessory Clinical Findings  CBC  Recent Labs  11/13/14 0400 11/15/14 0345  WBC 11.2* 7.4  NEUTROABS 9.1*  --   HGB 12.4* 12.0*  HCT 37.4* 36.3*  MCV 87.8 88.3  PLT 182 191   Basic Metabolic Panel  Recent  Labs  40/98/11 0443 11/15/14 0345  NA 137 141  K 3.8 3.1*  CL 103 100*  CO2 26 30  GLUCOSE 91 99  BUN 14 11  CREATININE 1.09 1.10  CALCIUM 8.4* 8.7*  MG 2.0  --   PHOS 4.3  --    Liver Function Tests  Recent Labs  11/13/14 0400  AST 97*  ALT 145*  ALKPHOS 51  BILITOT 1.1  PROT 6.3*  ALBUMIN 3.2*   No results for input(s): LIPASE, AMYLASE in the last 72 hours. Cardiac Enzymes No results for input(s): CKTOTAL, CKMB, CKMBINDEX, TROPONINI in the last 72 hours. BNP Invalid input(s): POCBNP D-Dimer No results for input(s): DDIMER in the last 72 hours. Hemoglobin A1C No results for input(s): HGBA1C in the last 72 hours. Fasting Lipid Panel  Recent Labs  11/12/14 1100  TRIG 165*   Thyroid Function Tests No results for input(s): TSH, T4TOTAL, T3FREE, THYROIDAB in the last 72 hours.  Invalid input(s): FREET3  TELE  nsr  Radiology/Studies  Ct Head Wo Contrast  11/10/2014   CLINICAL DATA:  Left-sided weakness.  Post cardiac arrest and CPR.  EXAM: CT HEAD WITHOUT CONTRAST  TECHNIQUE: Contiguous axial images were obtained from the base of the skull through the vertex without intravenous contrast.  COMPARISON:  None.  FINDINGS: Questionable vague area decreased density in the anterior right temporal lobe without mass effect. No intracranial hemorrhage or midline shift. No hydrocephalus. The basilar cisterns are patent. No cerebral edema. No intracranial fluid collection. Calvarium is intact. Minimal opacification of ethmoid air cells and small fluid levels in the sphenoid sinus, may be related to intubation. The mastoid air cells are well aerated.  IMPRESSION: Questionable vague area decreased density in the anterior right temporal lobe. This may reflect a region of acute or subacute ischemia versus volume averaging. There is no hemorrhage or mass effect.  These results were called by telephone at the time of interpretation on 11/10/2014 at 2:56 am to Dr. Loren Racer , who  verbally acknowledged these results.   Electronically Signed   By: Rubye Oaks M.D.   On: 11/10/2014 02:56   Dg Chest Port 1 View  11/14/2014   CLINICAL DATA:  Pneumonia.  EXAM: PORTABLE CHEST - 1 VIEW  COMPARISON:  11/13/2014 and 11/12/2014.  FINDINGS: 0526 hours. Interval extubation with removal of the nasogastric tube. Left IJ central venous catheter appears unchanged at the mid SVC level. The overall pulmonary aeration has slightly improved. There are persistent bilateral perihilar and retrocardiac pulmonary opacities without consolidation. No significant pleural effusion. The heart size and mediastinal contours are stable.  IMPRESSION: Slight improvement in overall aeration following extubation. Persistent perihilar and left lower lobe airspace opacities.   Electronically Signed   By: Carey Bullocks M.D.   On: 11/14/2014 09:10   Dg Chest Port 1 View  11/13/2014   CLINICAL DATA:  Pneumonia  EXAM: PORTABLE CHEST - 1 VIEW  COMPARISON:  11/12/2014  FINDINGS: Endotracheal tube, nasogastric catheter and left jugular central line are again seen and stable. The overall inspiratory effort is poor. Vascular crowding is noted centrally. Mild persistent left basilar atelectasis is noted. The cardiac shadow is stable.  IMPRESSION: Poor inspiratory effort.  Persistent left basilar atelectasis.   Electronically Signed   By: Alcide Clever M.D.   On: 11/13/2014 07:02   Dg Chest Port 1 View  11/12/2014   CLINICAL DATA:  Respiratory failure, cardiac arrest, intubated patient.  EXAM: PORTABLE CHEST - 1 VIEW  COMPARISON:  Portable chest x-ray of November 11, 2014  FINDINGS: The lungs are reasonably well inflated. The right hemidiaphragm remains higher than the left. The retrocardiac region on the left remains dense. Interstitial density in the right upper lobe is less conspicuous today. The cardiac silhouette is top-normal in size. The central pulmonary vascularity is less engorged.  The endotracheal tube tip lies 4 cm  above the carina. The esophagogastric tube tip projects below the inferior margin of the image. The left internal jugular venous catheter tip projects over the junction of the proximal and midportions of the SVC.  IMPRESSION: Slight interval improvement in interstitial edema. Left lower lobe atelectasis persists. The support tubes are in reasonable position.   Electronically Signed   By: David  [place] M.D.   On: 11/12/2014 07:13   Dg Chest Port 1 View  11/11/2014   CLINICAL DATA:  Cardiac arrest, acute respiratory failure, intubated patient.  EXAM: PORTABLE CHEST - 1 VIEW  COMPARISON:  Portable chest x-ray of November 10, 2014  FINDINGS: The lungs are adequately inflated. There remain mildly increased interstitial densities in the  upper perihilar regions. These are less conspicuous today. The cardiac silhouette is normal in size. The pulmonary vascularity is less engorged. There are external pacing and defibrillator pads present. The endotracheal tube tip lies 5.4 cm above the carina. The esophagogastric tube tip projects below the inferior margin of the image. A new left internal jugular venous catheter tip projects over the midportion of the SVC  IMPRESSION: Interval improvement in the appearance of the pulmonary interstitium consistent with resolving interstitial edema. There is persistent subsegmental left lower lobe atelectasis. The support tubes are in reasonable position.   Electronically Signed   By: David  [place] M.D.   On: 11/11/2014 07:10   Dg Chest Port 1 View  11/10/2014   CLINICAL DATA:  Clotted central line, initial encounter  EXAM: PORTABLE CHEST - 1 VIEW  COMPARISON:  Portable exam 1320 hours compared to 0429 hours  FINDINGS: Tip of endotracheal tube projects 4.9 cm above carina.  Nasogastric tube extends into stomach.  LEFT jugular central venous catheter tip projects over SVC.  External pacing leads in EKG leads project over chest.  Upper normal size of cardiac silhouette.  Persistent  perihilar infiltrates greatest in RIGHT upper lobe and LEFT lower lobe, question edema versus infection.  No pleural effusion or pneumothorax.  IMPRESSION: Persistent pulmonary infiltrates.  Stable line and tube positions.  Little interval change.   Electronically Signed   By: Ulyses Southward M.D.   On: 11/10/2014 13:28   Dg Chest Port 1 View  11/10/2014   CLINICAL DATA:  Hypoxia.  EXAM: PORTABLE CHEST - 1 VIEW  COMPARISON:  Earlier today at 0047 hour  FINDINGS: 0429 hour: Endotracheal tube is approximately 4.0 cm from the carina. Enteric tube in place, tip and side port below the diaphragm not included in the field of view.  Progressive opacity in the right upper lobe, now abutting the minor fissure. Development of retrocardiac opacity and obscuration of the hemidiaphragm. Increasing hazy opacity in the left midlung zone, partially obscured. Cardiomediastinal contours are unchanged, heart is mildly enlarged. Multiple overlying monitoring devices remain in place.  IMPRESSION: 1. Development of retrocardiac opacity and increasing hazy opacity in the left mid lung zone. This may reflect atelectasis or developing pulmonary edema. 2. Increasing right upper lobe opacity, and now all abutting the minor fissure. Pulmonary edema versus atelectasis again considered.   Electronically Signed   By: Rubye Oaks M.D.   On: 11/10/2014 04:34   Dg Chest Portable 1 View  11/10/2014   CLINICAL DATA:  Intubation.  Post CPR.  EXAM: PORTABLE CHEST - 1 VIEW  COMPARISON:  None.  FINDINGS: Endotracheal tube approximately 2.6 cm from the carina. Enteric tube in place, tip and side-port below the diaphragm not included in the field of view. Multiple overlying monitoring devices partially obscuring evaluation. Heart appears mildly enlarged. Lung volumes are low. Ill-defined hazy opacity in the right upper lung zone. Mild vascular congestion. No displaced rib fracture or large pneumothorax. No large pleural effusion.  IMPRESSION: 1.  Endotracheal tube 2.6 cm from the carina.  Enteric tube in place. 2. Probable cardiomegaly. Ill-defined opacity in the right upper lung zone, may reflect asymmetric pulmonary edema, pneumonia or contusion given history of CPR. Multiple overlying monitoring devices partially obscure evaluation of the lung parenchyma.   Electronically Signed   By: Rubye Oaks M.D.   On: 11/10/2014 01:05    ASSESSMENT AND PLAN  1. VF arrest - s/p rescucitation 2. Anoxic encephalopathy - now resolved. Neuro status is back  to baseline 3. Non-ischemic CM - EF 10% by cath. Patient states that his [place] cardiologist told him " your heart is doing ok but you might want a defibrillator some day" implying to me that his cardiomyopathy is not new. In setting of LBBB/IVCD 140 ms, will consider BiV ICD, although his history suggests that he had minimal heart failure symptoms prior to his event. I have informed him that he is not supposed to drive for 6 months.  Maryori Weide,M.D.  11/15/2014 9:51 AM

## 2014-11-16 NOTE — Progress Notes (Signed)
Speech Language Pathology Treatment: Dysphagia  Patient Details Name: Brian Noble MRN: [id number] DOB: 1961-01-06 Today's Date: 11/16/2014 Time: 1290-4753 SLP Time Calculation (min) (ACUTE ONLY): 13 min  Assessment / Plan / Recommendation Clinical Impression  SLP provided skilled observation during lunch meal, consisting of regular textures, purees, and thin liquids. Pt independently consumed solids and liquids without overt signs of aspiration or apparent dysphagia. SLP to sign off at this time - pt and family in agreement.   HPI Other Pertinent Information: 53 year old male who suffered a witnessed cardiac arrest at home. VF. Downtime reportedly less than 20 mins. Combative after ROSC. Intubated in ED and taken to cath lab where he was found to have clean coronary arteries.    Pertinent Vitals Pain Assessment: No/denies pain  SLP Plan  All goals met    Recommendations Diet recommendations: Regular;Thin liquid Liquids provided via: Cup;Straw Medication Administration: Whole meds with liquid Supervision: Patient able to self feed Compensations: Slow rate;Small sips/bites Postural Changes and/or Swallow Maneuvers: Seated upright 90 degrees       Oral Care Recommendations: Oral care BID Follow up Recommendations: None Plan: All goals met    Brian Noble, M.A. CCC-SLP [phone]  Brian Noble 11/16/2014, 3:30 PM

## 2014-11-16 NOTE — Progress Notes (Signed)
Patient Name: Brian Noble Date of Encounter: 11/16/2014  Active Problems:   Sudden cardiac death   Cardiac arrest   Acute respiratory failure with hypoxia   Hypoxia   Cardiogenic shock   Central line clotted  SUBJECTIVE  Feeling much better. Denies chest pain, sob or palpitation. BiV ICD implant today.   CURRENT MEDS . ampicillin-sulbactam (UNASYN) IV  3 g Intravenous Q8H  . aspirin  81 mg Oral Daily  . carvedilol  3.125 mg Oral BID WC  .  ceFAZolin (ANCEF) IV  2 g Intravenous To Cath  . dextrose  25 mL Intravenous Once  . furosemide  40 mg Intravenous Q12H  . gentamicin irrigation  80 mg Irrigation To Cath  . lisinopril  10 mg Oral Daily  . magnesium chloride  1 tablet Oral BID  . potassium chloride  40 mEq Oral Daily    OBJECTIVE  Filed Vitals:   11/15/14 1537 11/15/14 1643 11/15/14 1951 11/16/14 0500  BP: 136/82 124/81 124/78 132/83  Pulse: 75 72 72 76  Temp: 98.1 F (36.7 C) 99.7 F (37.6 C) 98.9 F (37.2 C) 98.5 F (36.9 C)  TempSrc: Oral Oral Oral Oral  Resp: Height:  (1.727 m)     Weight: 170 lb 9.6 oz (77.384 kg)     SpO2: 99% 95% 96% 96%    Intake/Output Summary (Last 24 hours) at 11/16/14 0748 Last data filed at 11/16/14 0015  Gross per 24 hour  Intake     30 ml  Output   1825 ml  Net  -1795 ml   Filed Weights   11/14/14 0700 11/15/14 0335 11/15/14 1537  Weight: 180 lb 1.9 oz (81.7 kg) 174 lb 9.7 oz (79.2 kg) 170 lb 9.6 oz (77.384 kg)    PHYSICAL EXAM  General: Pleasant, NAD. Neuro: Alert and oriented X 3. Moves all extremities spontaneously. Psych: Normal affect. HEENT:  Normal  Neck: Supple without bruits or JVD. Lungs:  Resp regular and unlabored, CTA. Heart: RRR no s3, s4, or murmurs. Abdomen: Soft, non-tender, non-distended, BS + x 4.  Extremities: No clubbing, cyanosis or edema. DP/PT/Radials 2+ and equal bilaterally.  Accessory Clinical Findings  CBC  Recent Labs  11/15/14 0345  WBC 7.4  HGB 12.0*   HCT 36.3*  MCV 88.3  PLT 191   Basic Metabolic Panel  Recent Labs  11/14/14 0443 11/15/14 0345 11/16/14 0230  NA 137 141 140  K 3.8 3.1* 3.6  CL 103 100* 101  CO2 GLUCOSE 91 99 91  BUN CREATININE 1.09 1.10 1.05  CALCIUM 8.4* 8.7* 8.8*  MG 2.0  --  1.8  PHOS 4.3  --  3.8    TELE  NSR with PVCs.    ASSESSMENT AND PLAN 54 year old male who suffered a witnessed cardiac arrest at home. VF. Downtime reportedly less than 20 mins. Combative after ROSC. Intubated in ED and taken to cath lab where he was found to have clean coronary arteries with EF ~20%.  1. VF arrest - s/p rescucitation 2. Anoxic encephalopathy - now resolved. A&O. 3. Non-ischemic CM - EF 20% by cath.  Biv ICD implant is scheduled for today. No driving for 6 months.   Signed, Bhagat,Bhavinkumar PA-C   History and all data above reviewed.  Patient examined.  I agree with the findings as above.  He denies any SOB.  No PND.  The patient exam reveals  COR:RRR  ,  Lungs: Clear  ,  Abd: Positive bowel sounds, no rebound no guarding, Ext No edema   .  All available labs, radiology testing, previous records reviewed. Agree with documented assessment and plan. Nonsichemic cardiomyopathy:  He apears to be euvolemic.  I will change to once daily PO Lasix.  ICD scheduled for later today.    Brian Noble  11:35 AM  11/16/2014

## 2014-11-16 NOTE — Interval H&P Note (Signed)
ICD Criteria  Current LVEF:15%. Within 12 months prior to implant: Yes   Heart failure history: Yes, Class II  Cardiomyopathy history: Yes, Non-Ischemic Cardiomyopathy.  Atrial Fibrillation/Atrial Flutter: No.  Ventricular tachycardia history: Yes, Hemodynamic instability present. VT Type: Sustained Ventricular Tachycardia - Polymorphic.  Cardiac arrest history: Yes, Ventricular Fibrillation.  History of syndromes with risk of sudden death: No.  Previous ICD: No.  Current ICD indication: Secondary  PPM indication: No.  Beta Blocker therapy for 3 or more months: No, medical reason.  Ace Inhibitor/ARB therapy for 3 or more months: Yes, prescribed.   History and Physical Interval Note:  11/16/2014 12:35 PM  Brian Noble  has presented today for surgery, with the diagnosis of cardiac arrest  The various methods of treatment have been discussed with the patient and family. After consideration of risks, benefits and other options for treatment, the patient has consented to  Procedure(s): BiV ICD Insertion CRT-D (N/A) as a surgical intervention .  The patient's history has been reviewed, patient examined, no change in status, stable for surgery.  I have reviewed the patient's chart and labs.  Questions were answered to the patient's satisfaction.     Sherryl Manges

## 2014-11-16 NOTE — Progress Notes (Signed)
[facility] PROGRESS NOTE  MADDEN GARRON ZOX:[id number] DOB: 1961/01/17 DOA: 11/10/2014 PCP: No primary care provider on file.  Brief Summary  The patient is a 54 year old male with history of nonischemic cardiomyopathy who suffered a witnessed cardiac arrest at home.  His daughter and wife performed immediate CPR.  Downtime reportedly less than 20 mins.  Combative after ROSC.  Intubated in ED and taken to cath lab where he was found to have clean coronary arteries with EF ~20%.  Arrest was felt to be due to hypokalemia from HCTZ in the setting of severe nonischemic cardiomyopathy.    8/23 witnessed arrest, Cath, to ICU. Hypothermia protocol initiated. 8/23 CT head > Questionable vague area decreased density in the anterior right temporal lobe. This may reflect a region of acute or subacute ischemia versus volume averaging. 8/26- extubated 8/27- neuro intact  Assessment/Plan  V-fib arrest due to hypokalemia and NICM (LBBB) -  Biv ICD placement today -  Keep K near 4 and Magnesium near 2 - PT/OT assessments Anoxic encephalopathy, resolved, A&Ox4  Nonischemic cardiomyopathy, EF 20% -  On ASA, ACEI, BB, and lasix -  Consider addition of spironolactone.  Defer to cardiology  Aspiration pneumonia, completing day 7 of antibiotics today -  D/c Unasyn after today -  Swallow evaluation -  BC NGTD  HTN, blood pressure stable to possibly mildly elevated given degree of heart failure -  Continue ACEI, BB  Arthritis, stable, tylenol prn  AKI and mild transaminitis due to shock from cardiac arrest/ATN, resolved.   Diet:  Low sodium  Access:  PIV IVF:  Yes prior to procedure today Proph:  lovenox  Code Status: full Family Communication: patient and his wife Disposition Plan: pending ICD placement today, to home tomorrow.  Pt/ot/slp assessments   Consultants:  Cardiology  PCCM  Antibiotics: Zosyn >>>dc Vanc>>>dc Unasyn 8/23 >>>  HPI/Subjective:  Feels well and wants  to go home.  Denies SOB and has mild residual soreness on his chest.  Eating and drinking okay and has been ambulating to the bathroom with assistance.  Objective: Filed Vitals:   11/15/14 1537 11/15/14 1643 11/15/14 1951 11/16/14 0500  BP: 136/82 124/81 124/78 132/83  Pulse: 75 72 72 76  Temp: 98.1 F (36.7 C) 99.7 F (37.6 C) 98.9 F (37.2 C) 98.5 F (36.9 C)  TempSrc: Oral Oral Oral Oral  Resp: Height:  (1.727 m)     Weight: 77.384 kg (170 lb 9.6 oz)     SpO2: 99% 95% 96% 96%    Intake/Output Summary (Last 24 hours) at 11/16/14 1238 Last data filed at 11/16/14 0659  Gross per 24 hour  Intake    680 ml  Output   1450 ml  Net   -770 ml   Filed Weights   11/14/14 0700 11/15/14 0335 11/15/14 1537  Weight: 81.7 kg (180 lb 1.9 oz) 79.2 kg (174 lb 9.7 oz) 77.384 kg (170 lb 9.6 oz)   Body mass index is 25.95 kg/(m^2).  Exam:   General:  Adult male, No acute distress  HEENT:  NCAT, MMM  Cardiovascular:  RRR, nl S1, S2, faint gallop, 2+ pulses, warm extremities  Respiratory:  CTAB, no increased WOB  Abdomen:   NABS, soft, NT/ND  MSK:   Normal tone and bulk, no LEE  Neuro:  Grossly intact  Data Reviewed: Basic Metabolic Panel:  Recent Labs Lab 11/10/14 0505  11/11/14 0520  11/12/14 0219 11/13/14 0400 11/14/14 0443  11/15/14 0345 11/16/14 0230  NA 139  < > 135  < > 139 139 137 141 140  K 3.8  < > 3.4*  < > 3.6 3.8 3.8 3.1* 3.6  CL 114*  < > 108  < > 110 105 103 100* 101  CO2 21*  < > 17*  --  21* 26 26 30 29   GLUCOSE 149*  < > 95  < > 86 78 91 99 91  BUN 16  < > 7  < > 13 13 14 11 16   CREATININE 1.51*  < > 0.77  < > 1.12 1.10 1.09 1.10 1.05  CALCIUM 6.6*  < > 7.6*  --  7.9* 8.4* 8.4* 8.7* 8.8*  MG 1.6*  --  1.7  --  1.7  --  2.0  --  1.8  PHOS 3.8  --  2.5  --  3.0  --  4.3  --  3.8  < > = values in this interval not displayed. Liver Function Tests:  Recent Labs Lab 11/10/14 0105 11/13/14 0400  AST 242* 97*  ALT 314* 145*  ALKPHOS  50 51  BILITOT 1.0 1.1  PROT 5.5* 6.3*  ALBUMIN 3.1* 3.2*   No results for input(s): LIPASE, AMYLASE in the last 168 hours. No results for input(s): AMMONIA in the last 168 hours. CBC:  Recent Labs Lab 11/10/14 0105  11/10/14 1305 11/11/14 0520 11/11/14 1143 11/12/14 0219 11/13/14 0400 11/15/14 0345  WBC 10.7*  < > 6.3 7.5  --  9.3 11.2* 7.4  NEUTROABS 5.7  --   --   --   --   --  9.1*  --   HGB 11.9*  < > 13.1 14.3 14.6 13.0 12.4* 12.0*  HCT 37.5*  < > 38.4* 41.9 43.0 38.4* 37.4* 36.3*  MCV 91.2  < > 86.3 84.1  --  85.7 87.8 88.3  PLT 218  < > 192 178  --  179 182 191  < > = values in this interval not displayed.  Recent Results (from the past 240 hour(s))  Culture, blood (routine x 2)     Status: None   Collection Time: 11/10/14  4:57 AM  Result Value Ref Range Status   Specimen Description BLOOD HAND RIGHT  Final   Special Requests BOTTLES DRAWN AEROBIC ONLY 1CC  Final   Culture NO GROWTH 5 DAYS  Final   Report Status 11/15/2014 FINAL  Final  Culture, blood (routine x 2)     Status: None   Collection Time: 11/10/14  5:01 AM  Result Value Ref Range Status   Specimen Description BLOOD HAND RIGHT  Final   Special Requests BOTTLES DRAWN AEROBIC ONLY 5CC  Final   Culture NO GROWTH 5 DAYS  Final   Report Status 11/15/2014 FINAL  Final  MRSA PCR Screening     Status: None   Collection Time: 11/10/14  5:12 AM  Result Value Ref Range Status   MRSA by PCR NEGATIVE NEGATIVE Final    Comment:        The GeneXpert MRSA Assay (FDA approved for NASAL specimens only), is one component of a comprehensive MRSA colonization surveillance program. It is not intended to diagnose MRSA infection nor to guide or monitor treatment for MRSA infections.   Culture, respiratory (NON-Expectorated)     Status: None   Collection Time: 11/10/14 11:15 AM  Result Value Ref Range Status   Specimen Description TRACHEAL ASPIRATE  Final   Special Requests NONE  Final   Gram Stain   Final     MODERATE WBC PRESENT, PREDOMINANTLY PMN RARE SQUAMOUS EPITHELIAL CELLS PRESENT NO ORGANISMS SEEN Performed at [facility]    Culture   Final    Non-Pathogenic Oropharyngeal-type Flora Isolated. Performed at [facility]    Report Status 11/12/2014 FINAL  Final  Culture, blood (routine x 2)     Status: None (Preliminary result)   Collection Time: 11/13/14 11:10 AM  Result Value Ref Range Status   Specimen Description BLOOD RIGHT ANTECUBITAL  Final   Special Requests BOTTLES DRAWN AEROBIC AND ANAEROBIC 10CC  Final   Culture NO GROWTH 3 DAYS  Final   Report Status PENDING  Incomplete  Culture, blood (routine x 2)     Status: None (Preliminary result)   Collection Time: 11/13/14 11:15 AM  Result Value Ref Range Status   Specimen Description BLOOD BLOOD LEFT FOREARM  Final   Special Requests BOTTLES DRAWN AEROBIC AND ANAEROBIC 10CC  Final   Culture NO GROWTH 3 DAYS  Final   Report Status PENDING  Incomplete     Studies: No results found.  Scheduled Meds: . ampicillin-sulbactam (UNASYN) IV  3 g Intravenous Q8H  . aspirin  81 mg Oral Daily  . carvedilol  3.125 mg Oral BID WC  .  ceFAZolin (ANCEF) IV  2 g Intravenous To Cath  . dextrose  25 mL Intravenous Once  . furosemide  40 mg Oral Daily  . gentamicin irrigation  80 mg Irrigation To Cath  . [START ON 11/17/2014] Influenza vac split quadrivalent PF  0.5 mL Intramuscular Tomorrow-1000  . lisinopril  10 mg Oral Daily  . magnesium chloride  1 tablet Oral BID  . potassium chloride  40 mEq Oral Daily   Continuous Infusions: . sodium chloride 10 mL/hr at 11/16/14 0445  . sodium chloride    . dextrose 5 % and 0.9% NaCl 30 mL/hr at 11/15/14 1109    Active Problems:   Sudden cardiac death   Cardiac arrest   Acute respiratory failure with hypoxia   Hypoxia   Cardiogenic shock   Central line clotted    Time spent: 30 min    Arbie Blankley, [facility]  [facility] Pager [phone]. If 7PM-7AM, please contact  night-coverage at [email or web address], password [facility] 11/16/2014, 12:38 PM  LOS: 6 days

## 2014-11-16 NOTE — Interval H&P Note (Signed)
ICD Criteria    Beta Blocker therapy for 3 or more months: Yes, prescribed.     11/16/2014 12:37 PM  Brian Noble  has presented today for surgery, with the diagnosis of cardiac arrest  The various methods of treatment have been discussed with the patient and family. After consideration of risks, benefits and other options for treatment, the patient has consented to  Procedure(s): BiV ICD Insertion CRT-D (N/A) as a surgical intervention .  The patient's history has been reviewed, patient examined, no change in status, stable for surgery.  I have reviewed the patient's chart and labs.  Questions were answered to the patient's satisfaction.     Sherryl Manges

## 2014-11-17 ENCOUNTER — Inpatient Hospital Stay (HOSPITAL_COMMUNITY): Payer: BLUE CROSS/BLUE SHIELD

## 2014-11-17 ENCOUNTER — Telehealth: Payer: Self-pay | Admitting: Cardiology

## 2014-11-17 DIAGNOSIS — I5023 Acute on chronic systolic (congestive) heart failure: Secondary | ICD-10-CM

## 2014-11-17 DIAGNOSIS — I472 Ventricular tachycardia, unspecified: Secondary | ICD-10-CM

## 2014-11-17 DIAGNOSIS — E876 Hypokalemia: Secondary | ICD-10-CM

## 2014-11-17 DIAGNOSIS — I428 Other cardiomyopathies: Secondary | ICD-10-CM

## 2014-11-17 LAB — BASIC METABOLIC PANEL
Anion gap: 8 (ref 5–15)
BUN: 18 mg/dL (ref 6–20)
CO2: 26 mmol/L (ref 22–32)
Calcium: 9 mg/dL (ref 8.9–10.3)
Chloride: 104 mmol/L (ref 101–111)
Creatinine, Ser: 1.08 mg/dL (ref 0.61–1.24)
GFR calc Af Amer: >60 mL/min (ref >60)
GFR calc non Af Amer: >60 mL/min (ref >60)
Glucose, Bld: 81 mg/dL (ref 65–99)
Potassium: 4.8 mmol/L (ref 3.5–5.1)
Sodium: 138 mmol/L (ref 135–145)

## 2014-11-17 LAB — GLUCOSE, CAPILLARY
Glucose-Capillary: 83 mg/dL (ref 65–99)
Glucose-Capillary: 88 mg/dL (ref 65–99)
Glucose-Capillary: 90 mg/dL (ref 65–99)
Glucose-Capillary: 87 mg/dL (ref 65–99)

## 2014-11-17 LAB — MAGNESIUM: Magnesium: 2.2 mg/dL (ref 1.7–2.4)

## 2014-11-17 MED ORDER — FUROSEMIDE 40 MG PO TABS: 40.0000 mg | ORAL_TABLET | Freq: Every day | ORAL | Status: DC

## 2014-11-17 MED ORDER — POTASSIUM CHLORIDE ER 20 MEQ PO TBCR: 20.0000 meq | EXTENDED_RELEASE_TABLET | Freq: Every day | ORAL | Status: DC

## 2014-11-17 MED ORDER — CARVEDILOL 6.25 MG PO TABS: 6.2500 mg | ORAL_TABLET | Freq: Two times a day (BID) | ORAL | Status: DC

## 2014-11-17 MED ORDER — MAGNESIUM OXIDE 400 MG PO TABS: 400.0000 mg | ORAL_TABLET | Freq: Two times a day (BID) | ORAL | Status: DC

## 2014-11-17 NOTE — Discharge Summary (Addendum)
Physician Discharge Summary  Brian Noble ZOX:[id number] DOB: 1960/05/30 DOA: 11/10/2014  PCP: [name], MD  Admit date: 11/10/2014 Discharge date: 11/25/2014  Recommendations for Outpatient Follow-up:  1. F/u with cardiology heart failure clinic.  Please repeat BMP to check potassium, creatinine, magnesium.   2. F/u with EP regarding ICD 3. PCP as needed 4. HH PT/OT and rolling walker  Discharge Diagnoses:  Active Problems:   Sudden cardiac death   Cardiac arrest   Acute respiratory failure with hypoxia   Hypoxia   Cardiogenic shock   Central line clotted   Aspiration pneumonia   Paroxysmal ventricular tachycardia   Nonischemic cardiomyopathy   Acute on chronic systolic heart failure, EF 10%   Hypokalemia   Hypomagnesemia   AKI (acute kidney injury)   Discharge Condition: stable, improved  Diet recommendation: healthy heart  Wt Readings from Last 3 Encounters:  11/24/14 78.926 kg (174 lb)  11/17/14 77.792 kg (171 lb 8 oz)    History of present illness:   The patient is a 54 year old male with history of nonischemic cardiomyopathy who suffered a witnessed cardiac arrest at home. His daughter and wife performed immediate CPR. Downtime reportedly less than 20 mins. Combative after ROSC. Intubated in ED and taken to cath lab where he was found to have clean coronary arteries with EF ~20%. Arrest was felt to be due to hypokalemia from HCTZ in the setting of severe nonischemic cardiomyopathy.   8/23 witnessed arrest, Cath, to ICU. Hypothermia protocol initiated. 8/23 CT head > Questionable vague area decreased density in the anterior right temporal lobe. This may reflect a region of acute or subacute ischemia versus volume averaging. 8/26- extubated 8/27- neuro intact 8/29 - BiV ICD placed  Hospital Course:   V-fib arrest due to hypokalemia and NICM (LBBB).  He underwent chest compressions by family at home and had ROSC after about 20 minutes.  His  potassium was 2.6 and magnesiumw as 1.6 on admission which likely contributed to his arrhythmia in the setting of previously well-compensated non-ischemic cardiomyopathy.  He underwent heart catheterization urgently which demonstrated nonobstructive disease.  He underwent cooling protocol and fortunately had a good neurologic outcome.  He had his HCTZ discontinued and has had repeated supplementation of his electrolytes which will need to be monitored closely as an outpatient.  He had BiV ICD placed by Dr. Graciela Husbands on 8/29.  He continued to have Brandie Lopes runs of nonsustained V-tach on telemetry, 4-beats x 2 in the last 24 hours prior to discharge.  His beta blocker dose was increased and he will need to follow up with cardiology as an outpatient both for ICD monitoring and maintenance, but also the heart failure clinic.  See below.    Nonischemic cardiomyopathy, EF 10-20%.  He is on ASA, ACEI, BB, and lasix.  Consider addition of spironolactone as outpatient.  Defer to cardiology.  S/p ICD for secondary prevention of VT.    Aspiration pneumonia, completed 7 days of antibiotics in hospital.  He was seen by speech therapy who recommended regular diet with thin liquids.  Blood cultures remained no growth to date.    HTN, blood pressure stable to possibly mildly elevated given degree of heart failure.  Continued ACEI, BB.    Arthritis, stable, tylenol prn  AKI and mild transaminitis due to cardiogenic shock from cardiac arrest, resolved.   Hypokalemia and hypomagnesemia, started on supplementation.  Repeat BMP and magnesium in one week post discharge.    Consultants:  Cardiology  PCCM  Antibiotics: Zosyn >>>dc Vanc>>>dc Unasyn 8/23 >>> 8/30  Discharge Exam: Filed Vitals:   11/17/14 0806  BP: 128/94  Pulse: 75  Temp:   Resp:    Filed Vitals:   11/16/14 1954 11/16/14 2100 11/17/14 0556 11/17/14 0806  BP:  119/74 125/72 128/94  Pulse:  73 77 75  Temp: 98.2 F (36.8 C)  98.9 F (37.2 C)    TempSrc: Oral  Oral   Resp:  19 18   Height:      Weight:   77.792 kg (171 lb 8 oz)   SpO2:  94% 92%      General: Adult male, No acute distress  HEENT: NCAT, MMM  Cardiovascular: RRR, nl S1, S2, faint gallop, 2+ pulses, warm extremities, left chest bandage clean and dry, no surrounding erythema or induration  Respiratory: CTAB, no increased WOB  Abdomen: NABS, soft, NT/ND  MSK: Normal tone and bulk, no LEE  Neuro: Grossly intact  Discharge Instructions      Discharge Instructions    (HEART FAILURE PATIENTS) Call MD:  Anytime you have any of the following symptoms: 1) 3 pound weight gain in 24 hours or 5 pounds in 1 week 2) shortness of breath, with or without a dry hacking cough 3) swelling in the hands, feet or stomach 4) if you have to sleep on extra pillows at night in order to breathe.    Complete by:  As directed      Call MD for:  difficulty breathing, headache or visual disturbances    Complete by:  As directed      Call MD for:  extreme fatigue    Complete by:  As directed      Call MD for:  hives    Complete by:  As directed      Call MD for:  persistant dizziness or light-headedness    Complete by:  As directed      Call MD for:  persistant nausea and vomiting    Complete by:  As directed      Call MD for:  redness, tenderness, or signs of infection (pain, swelling, redness, odor or Schwarz/yellow discharge around incision site)    Complete by:  As directed      Call MD for:  severe uncontrolled pain    Complete by:  As directed      Call MD for:  temperature >100.4    Complete by:  As directed      Diet - low sodium heart healthy    Complete by:  As directed      Discharge instructions    Complete by:  As directed   You were hospitalized with cardiac arrest or death from a heart rhythm problem related to low potassium, low magnesium, and heart failure.  Make sure you continue to take your potassium and magnesium supplements and have your blood  levels repeated at your next visit with cardiology.  We have stopped your amlodipine and your HCTZ and started you on lasix instead.  You had an ICD/PPM placed to help protect you from future heart rhythm problems.  Please follow up with cardiology in 1-2 weeks.  If you gain more than 3-lbs in one day or 5-lbs in seven days, of if you become Autumn Gunn of breath or have increasing swelling in your ankles or legs, please call the cardiology office right away.  If you become very Homero Hyson of breath or have chest pains, fevers, chills, or signs of infection  around your pacemaker, come to the emergency department right away or call 911.     Increase activity slowly    Complete by:  As directed             Medication List    STOP taking these medications        amLODipine 10 MG tablet  Commonly known as:  NORVASC     hydrochlorothiazide 50 MG tablet  Commonly known as:  HYDRODIURIL     POTASSIUM PO      TAKE these medications        aspirin EC 81 MG tablet  Take 81 mg by mouth daily.     carvedilol 6.25 MG tablet  Commonly known as:  COREG  Take 6.25 mg by mouth 2 (two) times daily.  Notes to Patient:  TAKE ONE DOSE TONIGHT     furosemide 40 MG tablet  Commonly known as:  LASIX  Take 1 tablet (40 mg total) by mouth daily.     lisinopril 20 MG tablet  Commonly known as:  PRINIVIL,ZESTRIL  Take 20 mg by mouth daily.     lovastatin 20 MG tablet  Commonly known as:  MEVACOR  Take 20 mg by mouth daily.  Notes to Patient:  TAKE TONIGHT     magnesium oxide 400 MG tablet  Commonly known as:  MAG-OX  Take 1 tablet (400 mg total) by mouth 2 (two) times daily.  Notes to Patient:  TAKE ONE DOSE TONIGHT     Potassium Chloride ER 20 MEQ Tbcr  Take 20 mEq by mouth daily.       Follow-up Information    Follow up with [facility] On 11/26/2014.   Why:  at [facility] for wound check   Contact information:   [address] [place] [address]       Follow up  with Tereso Newcomer, PA-C On 11/24/2014.   Specialties:  Physician Assistant, Radiology, Interventional Cardiology   Why:  @2 :00 cardiology TCM   Contact information:   [address] [place] [address] [phone]        The results of significant diagnostics from this hospitalization (including imaging, microbiology, ancillary and laboratory) are listed below for reference.    Significant Diagnostic Studies: Dg Chest 2 View  11/17/2014   CLINICAL DATA:  Cardiac device in place  EXAM: CHEST  2 VIEW  COMPARISON:  11/14/2014  FINDINGS: New left subclavian approach single chamber right ventricular ICD/ pacer lead. Stable cardiopericardial enlargement. Stable aortic and hilar contours, evaluation limited by hypoventilation.  Mild perihilar atelectasis.  No edema, effusion, or air leak.  IMPRESSION: 1. No adverse findings after single chamber pacer insertion. 2. Hypoventilation and mild atelectasis.   Electronically Signed   By: Marnee Spring M.D.   On: 11/17/2014 07:46   Ct Head Wo Contrast  11/10/2014   CLINICAL DATA:  Left-sided weakness.  Post cardiac arrest and CPR.  EXAM: CT HEAD WITHOUT CONTRAST  TECHNIQUE: Contiguous axial images were obtained from the base of the skull through the vertex without intravenous contrast.  COMPARISON:  None.  FINDINGS: Questionable vague area decreased density in the anterior right temporal lobe without mass effect. No intracranial hemorrhage or midline shift. No hydrocephalus. The basilar cisterns are patent. No cerebral edema. No intracranial fluid collection. Calvarium is intact. Minimal opacification of ethmoid air cells and small fluid levels in the sphenoid sinus, may be related to intubation. The mastoid air  cells are well aerated.  IMPRESSION: Questionable vague area decreased density in the anterior right temporal lobe. This may reflect a region of acute or subacute ischemia versus volume averaging. There is no hemorrhage or mass effect.   These results were called by telephone at the time of interpretation on 11/10/2014 at 2:56 am to Dr. Loren Racer , who verbally acknowledged these results.   Electronically Signed   By: Rubye Oaks M.D.   On: 11/10/2014 02:56   Dg Chest Port 1 View  11/14/2014   CLINICAL DATA:  Pneumonia.  EXAM: PORTABLE CHEST - 1 VIEW  COMPARISON:  11/13/2014 and 11/12/2014.  FINDINGS: 0526 hours. Interval extubation with removal of the nasogastric tube. Left IJ central venous catheter appears unchanged at the mid SVC level. The overall pulmonary aeration has slightly improved. There are persistent bilateral perihilar and retrocardiac pulmonary opacities without consolidation. No significant pleural effusion. The heart size and mediastinal contours are stable.  IMPRESSION: Slight improvement in overall aeration following extubation. Persistent perihilar and left lower lobe airspace opacities.   Electronically Signed   By: Carey Bullocks M.D.   On: 11/14/2014 09:10   Dg Chest Port 1 View  11/13/2014   CLINICAL DATA:  Pneumonia  EXAM: PORTABLE CHEST - 1 VIEW  COMPARISON:  11/12/2014  FINDINGS: Endotracheal tube, nasogastric catheter and left jugular central line are again seen and stable. The overall inspiratory effort is poor. Vascular crowding is noted centrally. Mild persistent left basilar atelectasis is noted. The cardiac shadow is stable.  IMPRESSION: Poor inspiratory effort.  Persistent left basilar atelectasis.   Electronically Signed   By: Alcide Clever M.D.   On: 11/13/2014 07:02   Dg Chest Port 1 View  11/12/2014   CLINICAL DATA:  Respiratory failure, cardiac arrest, intubated patient.  EXAM: PORTABLE CHEST - 1 VIEW  COMPARISON:  Portable chest x-ray of November 11, 2014  FINDINGS: The lungs are reasonably well inflated. The right hemidiaphragm remains higher than the left. The retrocardiac region on the left remains dense. Interstitial density in the right upper lobe is less conspicuous today. The cardiac  silhouette is top-normal in size. The central pulmonary vascularity is less engorged.  The endotracheal tube tip lies 4 cm above the carina. The esophagogastric tube tip projects below the inferior margin of the image. The left internal jugular venous catheter tip projects over the junction of the proximal and midportions of the SVC.  IMPRESSION: Slight interval improvement in interstitial edema. Left lower lobe atelectasis persists. The support tubes are in reasonable position.   Electronically Signed   By: David  [place] M.D.   On: 11/12/2014 07:13   Dg Chest Port 1 View  11/11/2014   CLINICAL DATA:  Cardiac arrest, acute respiratory failure, intubated patient.  EXAM: PORTABLE CHEST - 1 VIEW  COMPARISON:  Portable chest x-ray of November 10, 2014  FINDINGS: The lungs are adequately inflated. There remain mildly increased interstitial densities in the upper perihilar regions. These are less conspicuous today. The cardiac silhouette is normal in size. The pulmonary vascularity is less engorged. There are external pacing and defibrillator pads present. The endotracheal tube tip lies 5.4 cm above the carina. The esophagogastric tube tip projects below the inferior margin of the image. A new left internal jugular venous catheter tip projects over the midportion of the SVC  IMPRESSION: Interval improvement in the appearance of the pulmonary interstitium consistent with resolving interstitial edema. There is persistent subsegmental left lower lobe atelectasis. The support tubes are in  reasonable position.   Electronically Signed   By: David  [place] M.D.   On: 11/11/2014 07:10   Dg Chest Port 1 View  11/10/2014   CLINICAL DATA:  Clotted central line, initial encounter  EXAM: PORTABLE CHEST - 1 VIEW  COMPARISON:  Portable exam 1320 hours compared to 0429 hours  FINDINGS: Tip of endotracheal tube projects 4.9 cm above carina.  Nasogastric tube extends into stomach.  LEFT jugular central venous catheter tip projects over  SVC.  External pacing leads in EKG leads project over chest.  Upper normal size of cardiac silhouette.  Persistent perihilar infiltrates greatest in RIGHT upper lobe and LEFT lower lobe, question edema versus infection.  No pleural effusion or pneumothorax.  IMPRESSION: Persistent pulmonary infiltrates.  Stable line and tube positions.  Little interval change.   Electronically Signed   By: Ulyses Southward M.D.   On: 11/10/2014 13:28   Dg Chest Port 1 View  11/10/2014   CLINICAL DATA:  Hypoxia.  EXAM: PORTABLE CHEST - 1 VIEW  COMPARISON:  Earlier today at 0047 hour  FINDINGS: 0429 hour: Endotracheal tube is approximately 4.0 cm from the carina. Enteric tube in place, tip and side port below the diaphragm not included in the field of view.  Progressive opacity in the right upper lobe, now abutting the minor fissure. Development of retrocardiac opacity and obscuration of the hemidiaphragm. Increasing hazy opacity in the left midlung zone, partially obscured. Cardiomediastinal contours are unchanged, heart is mildly enlarged. Multiple overlying monitoring devices remain in place.  IMPRESSION: 1. Development of retrocardiac opacity and increasing hazy opacity in the left mid lung zone. This may reflect atelectasis or developing pulmonary edema. 2. Increasing right upper lobe opacity, and now all abutting the minor fissure. Pulmonary edema versus atelectasis again considered.   Electronically Signed   By: Rubye Oaks M.D.   On: 11/10/2014 04:34   Dg Chest Portable 1 View  11/10/2014   CLINICAL DATA:  Intubation.  Post CPR.  EXAM: PORTABLE CHEST - 1 VIEW  COMPARISON:  None.  FINDINGS: Endotracheal tube approximately 2.6 cm from the carina. Enteric tube in place, tip and side-port below the diaphragm not included in the field of view. Multiple overlying monitoring devices partially obscuring evaluation. Heart appears mildly enlarged. Lung volumes are low. Ill-defined hazy opacity in the right upper lung zone. Mild  vascular congestion. No displaced rib fracture or large pneumothorax. No large pleural effusion.  IMPRESSION: 1. Endotracheal tube 2.6 cm from the carina.  Enteric tube in place. 2. Probable cardiomegaly. Ill-defined opacity in the right upper lung zone, may reflect asymmetric pulmonary edema, pneumonia or contusion given history of CPR. Multiple overlying monitoring devices partially obscure evaluation of the lung parenchyma.   Electronically Signed   By: Rubye Oaks M.D.   On: 11/10/2014 01:05    Microbiology: No results found for this or any previous visit (from the past 240 hour(s)).   Labs: Basic Metabolic Panel:  Recent Labs Lab 11/24/14 1450  NA 139  K 4.2  CL 101  CO2 29  GLUCOSE 82  BUN 17  CREATININE 1.04  CALCIUM 9.5  MG 2.0   Liver Function Tests: No results for input(s): AST, ALT, ALKPHOS, BILITOT, PROT, ALBUMIN in the last 168 hours. No results for input(s): LIPASE, AMYLASE in the last 168 hours. No results for input(s): AMMONIA in the last 168 hours. CBC: No results for input(s): WBC, NEUTROABS, HGB, HCT, MCV, PLT in the last 168 hours. Cardiac Enzymes: No results  for input(s): CKTOTAL, CKMB, CKMBINDEX, TROPONINI in the last 168 hours. BNP: BNP (last 3 results)  Recent Labs  11/10/14 0105  BNP 136.7*    ProBNP (last 3 results) No results for input(s): PROBNP in the last 8760 hours.  CBG: No results for input(s): GLUCAP in the last 168 hours.  Time coordinating discharge: 35 minutes  Signed:  Kyilee Gregg  [facility] 11/25/2014, 5:41 PM

## 2014-11-17 NOTE — Progress Notes (Signed)
Patient Name: Brian Noble Date of Encounter: 11/17/2014  Active Problems:   Sudden cardiac death   Cardiac arrest   Acute respiratory failure with hypoxia   Hypoxia   Cardiogenic shock   Central line clotted  SUBJECTIVE  Feeling much better. Denies chest pain, sob or palpitation. Wants to go home.  CURRENT MEDS . aspirin  81 mg Oral Daily  . carvedilol  3.125 mg Oral BID WC  . dextrose  25 mL Intravenous Once  . furosemide  40 mg Oral Daily  . lisinopril  10 mg Oral Daily  . magnesium chloride  1 tablet Oral BID  . potassium chloride  40 mEq Oral Daily    OBJECTIVE  Filed Vitals:   11/16/14 1954 11/16/14 2100 11/17/14 0556 11/17/14 0806  BP:  119/74 125/72 128/94  Pulse:  73 77 75  Temp: 98.2 F (36.8 C)  98.9 F (37.2 C)   TempSrc: Oral  Oral   Resp:  19 18   Height:      Weight:   171 lb 8 oz (77.792 kg)   SpO2:  94% 92%     Intake/Output Summary (Last 24 hours) at 11/17/14 1018 Last data filed at 11/17/14 0800  Gross per 24 hour  Intake   1440 ml  Output    225 ml  Net   1215 ml   Filed Weights   11/15/14 0335 11/15/14 1537 11/17/14 0556  Weight: 174 lb 9.7 oz (79.2 kg) 170 lb 9.6 oz (77.384 kg) 171 lb 8 oz (77.792 kg)    PHYSICAL EXAM  General: Pleasant, NAD. Neuro: Alert and oriented X 3. Moves all extremities spontaneously. Psych: Normal affect. HEENT:  Normal  Neck: Supple without bruits or JVD. Lungs:  Resp regular and unlabored, CTA. L chest with dressing in place. Not TTP.  Heart: RRR no s3, s4, or murmurs. Abdomen: Soft, non-tender, non-distended, BS + x 4.  Extremities: No clubbing, cyanosis or edema. DP/PT/Radials 2+ and equal bilaterally.  Accessory Clinical Findings  CBC  Recent Labs  11/15/14 0345  WBC 7.4  HGB 12.0*  HCT 36.3*  MCV 88.3  PLT 191   Basic Metabolic Panel  Recent Labs  11/15/14 0345 11/16/14 0230  NA 141 140  K 3.1* 3.6  CL 100* 101  CO2 30 29  GLUCOSE 99 91  BUN 11 16  CREATININE 1.10 1.05    CALCIUM 8.7* 8.8*  MG  --  1.8  PHOS  --  3.8    TELE  NSR with PVCs and NSVT.   ASSESSMENT AND PLAN 54 year old male who suffered a witnessed cardiac arrest at home. VF. Downtime reportedly less than 20 mins. Combative after ROSC. Intubated in ED and taken to cath lab where he was found to have clean coronary arteries with EF ~20%.  1. VF arrest - s/p rescucitation 2. Anoxic encephalopathy - now resolved. A&O. 3. Non-ischemic CM - EF 20% by cath. ICD implant done yesterday. [employer]onic ICD, serial number [id number] .Through the device, the bipolar R wave was 15 millivolts, impedance was 513 ohms, the pacing threshold was 0.5 volts at 0.4 msec. T High-voltage impedance was 77 ohms. No driving for 6 months.  4. NSVT - Patient had a 2 episodes of NSVT this morning (4 beats 6:28am and 7 beats 6:54am). Asymptomatic. No labs were drawn today. Will get BMET and Mg.  Signed, Bhagat,Bhavinkumar PA-C   History and all data above reviewed.  Patient examined.  I agree with  the findings as above.  He is anxious to go home.  No pain.  No SOB.  The patient exam reveals COR:RRR  ,  Lungs: Clear  ,  Abd: Positive bowel sounds, no rebound no guarding, Ext No edema,  Chest:  ICD wound without drainage or erythema  .  All available labs, radiology testing, previous records reviewed. Agree with documented assessment and plan. Sudden cardiac death:  Now s/p ICD.  NSVT this AM.  Check electrolytes.  Probably home this afternoon.  I will increase his beta blocker.  We will see in [facility] clinic and follow in HF and EP clinics.    Brian Noble  11:04 AM  11/17/2014

## 2014-11-17 NOTE — Progress Notes (Signed)
Discharge order received.  Discharge paperwork and education included cath site, ICD site and heart failure education completed with patient and wife.

## 2014-11-17 NOTE — Discharge Instructions (Signed)
Supplemental Discharge Instructions for  Pacemaker/Defibrillator Patients  Activity No heavy lifting or vigorous activity with your left/right arm for 6 to 8 weeks.  Do not raise your left/right arm above your head for one week.  Gradually raise your affected arm as drawn below.           __      11/20/14                          11/21/14                    11/22/14                 11/23/14  NO DRIVING for  6 months     WOUND CARE - Keep the wound area clean and dry.  Do not get this area wet for one week. No showers for one week; you may shower on  11/23/14   . - The tape/steri-strips on your wound will fall off; do not pull them off.  No bandage is needed on the site.  DO  NOT apply any creams, oils, or ointments to the wound area. - If you notice any drainage or discharge from the wound, any swelling or bruising at the site, or you develop a fever > 101? F after you are discharged home, call the office at once.  Special Instructions - You are still able to use cellular telephones; use the ear opposite the side where you have your pacemaker/defibrillator.  Avoid carrying your cellular phone near your device. - When traveling through airports, show security personnel your identification card to avoid being screened in the metal detectors.  Ask the security personnel to use the hand wand. - Avoid arc welding equipment, MRI testing (magnetic resonance imaging), TENS units (transcutaneous nerve stimulators).  Call the office for questions about other devices. - Avoid electrical appliances that are in poor condition or are not properly grounded. - Microwave ovens are safe to be near or to operate.  Additional information for defibrillator patients should your device go off: - If your device goes off ONCE and you feel fine afterward, notify the device clinic nurses. - If your device goes off ONCE and you do not feel well afterward, call 911. - If your device goes off TWICE, call 911. - If your  device goes off THREE times in one day, call 911.  DO NOT DRIVE YOURSELF OR A FAMILY MEMBER WITH A DEFIBRILLATOR TO THE HOSPITAL--CALL 911.  Angiogram, Care After Refer to this sheet in the next few weeks. These instructions provide you with information on caring for yourself after your procedure. Your health care provider may also give you more specific instructions. Your treatment has been planned according to current medical practices, but problems sometimes occur. Call your health care provider if you have any problems or questions after your procedure.  WHAT TO EXPECT AFTER THE PROCEDURE After your procedure, it is typical to have the following sensations:  Minor discomfort or tenderness and a small bump at the catheter insertion site. The bump should usually decrease in size and tenderness within 1 to 2 weeks.  Any bruising will usually fade within 2 to 4 weeks. HOME CARE INSTRUCTIONS   You may need to keep taking blood thinners if they were prescribed for you. Take medicines only as directed by your health care provider.  Do not apply powder or lotion to the site.  Do not take baths, swim, or use a hot tub until your health care provider approves.  You may shower 24 hours after the procedure. Remove the bandage (dressing) and gently wash the site with plain soap and water. Gently pat the site dry.  Inspect the site at least twice daily.  Limit your activity for the first 48 hours. Do not bend, squat, or lift anything over 20 lb (9 kg) or as directed by your health care provider.  Plan to have someone take you home after the procedure. Follow instructions about when you can drive or return to work. SEEK MEDICAL CARE IF:  You get light-headed when standing up.  You have drainage (other than a small amount of blood on the dressing).  You have chills.  You have a fever.  You have redness, warmth, swelling, or pain at the insertion site. SEEK IMMEDIATE MEDICAL CARE IF:    You develop chest pain or shortness of breath, feel faint, or pass out.  You have bleeding, swelling larger than a walnut, or drainage from the catheter insertion site.  You develop pain, discoloration, coldness, or severe bruising in the leg or arm that held the catheter.  You develop bleeding from any other place, such as the bowels. You may see bright red blood in your urine or stools, or your stools may appear black and tarry.  You have heavy bleeding from the site. If this happens, hold pressure on the site. MAKE SURE YOU:  Understand these instructions.  Will watch your condition.  Will get help right away if you are not doing well or get worse. Document Released: 09/22/2004 Document Revised: 07/21/2013 Document Reviewed: 07/29/2012 [facility] Patient Information 2015 [place], [place]. This information is not intended to replace advice given to you by your health care provider. Make sure you discuss any questions you have with your health care provider.  Cardiac Diet This diet can help prevent heart disease and stroke. Many factors influence your heart health, including eating and exercise habits. Coronary risk rises a lot with abnormal blood fat (lipid) levels. Cardiac meal planning includes limiting unhealthy fats, increasing healthy fats, and making other small dietary changes. General guidelines are as follows:  Adjust calorie intake to reach and maintain desirable body weight.  Limit total fat intake to less than 30% of total calories. Saturated fat should be less than 7% of calories.  Saturated fats are found in animal products and in some vegetable products. Saturated vegetable fats are found in coconut oil, cocoa butter, palm oil, and palm kernel oil. Read labels carefully to avoid these products as much as possible. Use butter in moderation. Choose tub margarines and oils that have 2 grams of fat or less. Good cooking oils are canola and olive oils.  Practice low-fat  cooking techniques. Do not fry food. Instead, broil, bake, boil, steam, grill, roast on a rack, stir-fry, or microwave it. Other fat reducing suggestions include:  Remove the skin from poultry.  Remove all visible fat from meats.  Skim the fat off stews, soups, and gravies before serving them.  Steam vegetables in water or broth instead of sauting them in fat.  Avoid foods with trans fat (or hydrogenated oils), such as commercially fried foods and commercially baked goods. Commercial shortening and deep-frying fats will contain trans fat.  Increase intake of fruits, vegetables, whole grains, and legumes to replace foods high in fat.  Increase consumption of nuts, legumes, and seeds to at least 4 servings weekly. One serving  of a legume equals  cup, and 1 serving of nuts or seeds equals  cup.  Choose whole grains more often. Have 3 servings per day (a serving is 1 ounce [oz]).  Eat 4 to 5 servings of vegetables per day. A serving of vegetables is 1 cup of raw leafy vegetables;  cup of raw or cooked cut-up vegetables;  cup of vegetable juice.  Eat 4 to 5 servings of fruit per day. A serving of fruit is 1 medium whole fruit;  cup of dried fruit;  cup of fresh, frozen, or canned fruit;  cup of 100% fruit juice.  Increase your intake of dietary fiber to 20 to 30 grams per day. Insoluble fiber may help lower your risk of heart disease and may help curb your appetite.  Soluble fiber binds cholesterol to be removed from the blood. Foods high in soluble fiber are dried beans, citrus fruits, oats, apples, bananas, broccoli, Brussels sprouts, and eggplant.  Try to include foods fortified with plant sterols or stanols, such as yogurt, breads, juices, or margarines. Choose several fortified foods to achieve a daily intake of 2 to 3 grams of plant sterols or stanols.  Foods with omega-3 fats can help reduce your risk of heart disease. Aim to have a 3.5 oz portion of fatty fish twice per week,  such as salmon, mackerel, albacore tuna, sardines, lake trout, or herring. If you wish to take a fish oil supplement, choose one that contains 1 gram of both DHA and EPA.  Limit processed meats to 2 servings (3 oz portion) weekly.  Limit the sodium in your diet to 1500 milligrams (mg) per day. If you have high blood pressure, talk to a registered dietitian about a DASH (Dietary Approaches to Stop Hypertension) eating plan.  Limit sweets and beverages with added sugar, such as soda, to no more than 5 servings per week. One serving is:   1 tablespoon sugar.  1 tablespoon jelly or jam.   cup sorbet.  1 cup lemonade.   cup regular soda. CHOOSING FOODS Starches  Allowed: Breads: All kinds (wheat, rye, raisin, white, oatmeal, [place], [place], and English muffin bread). Low-fat rolls: English muffins, frankfurter and hamburger buns, bagels, pita bread, tortillas (not fried). Pancakes, waffles, biscuits, and muffins made with recommended oil.  Avoid: Products made with saturated or trans fats, oils, or whole milk products. Butter rolls, cheese breads, croissants. Commercial doughnuts, muffins, sweet rolls, biscuits, waffles, pancakes, store-bought mixes. Crackers  Allowed: Low-fat crackers and snacks: Animal, graham, rye, saltine (with recommended oil, no lard), oyster, and matzo crackers. Bread sticks, melba toast, rusks, flatbread, pretzels, and light popcorn.  Avoid: High-fat crackers: cheese crackers, butter crackers, and those made with coconut, palm oil, or trans fat (hydrogenated oils). Buttered popcorn. Cereals  Allowed: Hot or cold whole-grain cereals.  Avoid: Cereals containing coconut, hydrogenated vegetable fat, or animal fat. Potatoes / Pasta / Rice  Allowed: All kinds of potatoes, rice, and pasta (such as macaroni, spaghetti, and noodles).  Avoid: Pasta or rice prepared with cream sauce or high-fat cheese. Chow mein noodles, [place] fries. Vegetables  Allowed: All  vegetables and vegetable juices.  Avoid: Fried vegetables. Vegetables in cream, butter, or high-fat cheese sauces. Limit coconut. Fruit in cream or custard. Protein  Allowed: Limit your intake of meat, seafood, and poultry to no more than 6 oz (cooked weight) per day. All lean, well-trimmed beef, veal, pork, and lamb. All chicken and [place] without skin. All fish and shellfish. Wild game: wild duck,  rabbit, pheasant, and venison. Egg whites or low-cholesterol egg substitutes may be used as desired. Meatless dishes: recipes with dried beans, peas, lentils, and tofu (soybean curd). Seeds and nuts: all seeds and most nuts.  Avoid: Prime grade and other heavily marbled and fatty meats, such as short ribs, spare ribs, rib eye roast or steak, frankfurters, sausage, bacon, and high-fat luncheon meats, mutton. Caviar. Commercially fried fish. Domestic duck, goose, venison sausage. Organ meats: liver, gizzard, heart, chitterlings, brains, kidney, sweetbreads. Dairy  Allowed: Low-fat cheeses: nonfat or low-fat cottage cheese (1% or 2% fat), cheeses made with part skim milk, such as mozzarella, farmers, string, or ricotta. (Cheeses should be labeled no more than 2 to 6 grams fat per oz.). Skim (or 1%) milk: liquid, powdered, or evaporated. Buttermilk made with low-fat milk. Drinks made with skim or low-fat milk or cocoa. Chocolate milk or cocoa made with skim or low-fat (1%) milk. Nonfat or low-fat yogurt.  Avoid: Whole milk cheeses, including colby, cheddar, [place], [address], [place], [place], [place], [address], [place], and blue. Creamed cottage cheese, cream cheese. Whole milk and whole milk products, including buttermilk or yogurt made from whole milk, drinks made from whole milk. Condensed milk, evaporated whole milk, and 2% milk. Soups and Combination Foods  Allowed: Low-fat low-sodium soups: broth, dehydrated soups, homemade broth, soups with the fat removed, homemade cream soups made with skim or  low-fat milk. Low-fat spaghetti, lasagna, chili, and Spanish rice if low-fat ingredients and low-fat cooking techniques are used.  Avoid: Cream soups made with whole milk, cream, or high-fat cheese. All other soups. Desserts and Sweets  Allowed: Sherbet, fruit ices, gelatins, meringues, and angel food cake. Homemade desserts with recommended fats, oils, and milk products. Jam, jelly, honey, marmalade, sugars, and syrups. Pure sugar candy, such as gum drops, hard candy, jelly beans, marshmallows, mints, and small amounts of dark chocolate.  Avoid: Commercially prepared cakes, pies, cookies, frosting, pudding, or mixes for these products. Desserts containing whole milk products, chocolate, coconut, lard, palm oil, or palm kernel oil. Ice cream or ice cream drinks. Candy that contains chocolate, coconut, butter, hydrogenated fat, or unknown ingredients. Buttered syrups. Fats and Oils  Allowed: Vegetable oils: safflower, sunflower, corn, soybean, cottonseed, sesame, canola, olive, or peanut. Non-hydrogenated margarines. Salad dressing or mayonnaise: homemade or commercial, made with a recommended oil. Low or nonfat salad dressing or mayonnaise.  Limit added fats and oils to 6 to 8 tsp per day (includes fats used in cooking, baking, salads, and spreads on bread). Remember to count the "hidden fats" in foods.  Avoid: Solid fats and shortenings: butter, lard, salt pork, bacon drippings. Gravy containing meat fat, shortening, or suet. Cocoa butter, coconut. Coconut oil, palm oil, palm kernel oil, or hydrogenated oils: these ingredients are often used in bakery products, nondairy creamers, whipped toppings, candy, and commercially fried foods. Read labels carefully. Salad dressings made of unknown oils, sour cream, or cheese, such as blue cheese and Roquefort. Cream, all kinds: half-and-half, light, heavy, or whipping. Sour cream or cream cheese (even if "light" or low-fat). Nondairy cream substitutes: coffee  creamers and sour cream substitutes made with palm, palm kernel, hydrogenated oils, or coconut oil. Beverages  Allowed: Coffee (regular or decaffeinated), tea. Diet carbonated beverages, mineral water. Alcohol: Check with your caregiver. Moderation is recommended.  Avoid: Whole milk, regular sodas, and juice drinks with added sugar. Condiments  Allowed: All seasonings and condiments. Cocoa powder. "Cream" sauces made with recommended ingredients.  Avoid: Carob powder made with hydrogenated fats. SAMPLE  MENU Breakfast   cup orange juice   cup oatmeal  1 slice toast  1 tsp margarine  1 cup skim milk Lunch  [place] sandwich with 2 oz [place], 2 slices bread  Lettuce and tomato slices  Fresh fruit  Carrot sticks  Coffee or tea Snack  Fresh fruit or low-fat crackers Dinner  3 oz lean ground beef  1 baked potato  1 tsp margarine   cup asparagus  Lettuce salad  1 tbs non-creamy dressing   cup peach slices  1 cup skim milk Document Released: 12/14/2007 Document Revised: 09/05/2011 Document Reviewed: 05/06/2013 [employer] Patient Information 2015 [place], [place]. This information is not intended to replace advice given to you by your health care provider. Make sure you discuss any questions you have with your health care provider.

## 2014-11-17 NOTE — Care Management Note (Signed)
Case Management Note  Patient Details  Name: Brian Noble MRN: [id number] Date of Birth: [birth year]  Subjective/Objective: Pt admitted due to cardiac arrest. Possible d/c today.                     Action/Plan: CM did speak with pt in ref to disposition needs. Pt is from home with wife and children. [place] Per pt he will not need any Physical Therapy once d/c. 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 Pt has PCP Dr. Brent Bulla, @ [address] [place], [place] [address]  Phone: [phone]. No further needs from CM at this time.     Expected Discharge Date:                  Expected Discharge Plan:  Home w Home Health Services  In-House Referral:  NA  Discharge planning Services  CM Consult  Post Acute Care Choice:  NA Choice offered to:  NA  DME Arranged:  N/A DME Agency:  NA  HH Arranged:  NA HH Agency:  NA  Status of Service:  Completed, signed off  Medicare Important Message Given:    Date Medicare IM Given:    Medicare IM give by:    Date Additional Medicare IM Given:    Additional Medicare Important Message give by:     If discussed at Long Length of Stay Meetings, dates discussed:    Additional Comments:  Gala Lewandowsky, RN 11/17/2014, 11:23 AM

## 2014-11-17 NOTE — Telephone Encounter (Signed)
New message    TCM appt on  9.6.2016 with Tereso Newcomer per Vin PA.

## 2014-11-18 LAB — CULTURE, BLOOD (ROUTINE X 2)
Culture: NO GROWTH
Culture: NO GROWTH

## 2014-11-18 NOTE — Telephone Encounter (Signed)
Patient contacted regarding discharge from [facility] on November 17, 2014.  Patient understands to follow up with provider Tereso Newcomer, PA-C on November 24, 2014 at [facility]. Patient understands discharge instructions? yes Patient understands medications and regiment? yes Patient understands to bring all medications to this visit? yes

## 2014-11-20 ENCOUNTER — Encounter: Payer: Self-pay | Admitting: *Deleted

## 2014-11-23 NOTE — Progress Notes (Signed)
Cardiology Office Note   Date:  11/24/2014   ID:  Brian Noble, DOB 28-Apr-1960, MRN [id number]  PCP:  [name], MD  Cardiologist:  [name]   Electrophysiologist:  [name]   Chief Complaint  Patient presents with  . Hospitalization Follow-up    s/p sudden cardiac death  . Cardiomyopathy     History of Present Illness: Brian Noble is a 54 y.o. male with a hx of DCM by report, HTN, CKD, HL.  Admitted 8/23-8/30 with OOH cardiac arrest.  CPR was performed at home by his wife and he was noted to be in VT/VF upon arrival of EMS.  He was defibrillated with ROSC.  In the ED had a PEA arrest, likely from hypoxemia, with ROSC with epinephrine and CPR.  LBBB was noted on ECG.  He was intubated and also was placed on hypothermia protocol.  He had rapid resuscitation and full neurologic recovery.  He required antibiotics due to aspiration pneumonia.  He was noted to be severely hypokalemic (K 2.5) on HCTZ at the time of admit.  Echo demonstrated EF 10%.  LHC demonstrated normal coronary arteries.  He was diuresed for volume excess and HF medications were adjusted.  He was ultimately seen by EP and a single chamber ICD was implanted by [name] 11/16/14.  He was noted to have NSVT on Tele prior to DC and beta-blocker was adjusted.    He returns for FU.  Here with his wife. He is doing well.  The patient denies chest pain, shortness of breath, syncope, orthopnea, PND or significant pedal edema.  He is fatigued.     Studies/Reports Reviewed Today:  Echo 11/10/14 EF 10%, diff HK, Gr 1 DD, mild dilated aortic root, mild MR, mild LAE, severely reduced RVSF   LHC 11/10/14 Normal Coronary Arteries Left Ventricle The left ventricle is enlarged. There is severe left ventricular systolic dysfunction. The left ventricular ejection fraction is less than 25% by visual estimate. There are wall motion abnormalities in the left ventricle.     Past Medical History    Diagnosis Date  . Hypertension   . Chronic kidney disease   . Arthritis   . NICM (nonischemic cardiomyopathy)     a. LHC 8/16:  normal coronary arteries.   . Chronic systolic CHF (congestive heart failure)     a. Echo 8/16:  EF 10%, diff HK, Gr 1 DD, mild dilated aortic root, mild MR, mild LAE, severely reduced RVSF  . VF (ventricular fibrillation)     s/p OOH cardiac arrest 8/16 >> s/p ICD  . ICD (implantable cardioverter-defibrillator) in place     Past Surgical History  Procedure Laterality Date  . Cardiac catheterization N/A 11/10/2014    Procedure: Left Heart Cath and Coronary Angiography;  Surgeon: [name], MD;  Location[facility] INVASIVE CV LAB;  Service: Cardiovascular;  Laterality: N/A;  . Appendectomy    . Ep implantable device N/A 11/16/2014    Procedure: ICD Implant;  Surgeon: [name], MD;  Location: [facility] INVASIVE CV LAB;  Service: Cardiovascular;  Laterality: N/A;     Current Outpatient Prescriptions  Medication Sig Dispense Refill  . aspirin EC 81 MG tablet Take 81 mg by mouth daily.    . carvedilol (COREG) 6.25 MG tablet Take 6.25 mg by mouth 2 (two) times daily.    . furosemide (LASIX) 40 MG tablet Take 1 tablet (40 mg total) by mouth daily. 30 tablet 0  .  lisinopril (PRINIVIL,ZESTRIL) 20 MG tablet Take 20 mg by mouth daily.    Marland Kitchen lovastatin (MEVACOR) 20 MG tablet Take 20 mg by mouth daily.    . magnesium oxide (MAG-OX) 400 MG tablet Take 1 tablet (400 mg total) by mouth 2 (two) times daily. 60 tablet 0  . Potassium Chloride ER 20 MEQ TBCR Take 20 mEq by mouth daily. 30 tablet 0   No current facility-administered medications for this visit.    Allergies:   Review of patient's allergies indicates no known allergies.    Social History:  The patient  reports that he has never smoked. He does not have any smokeless tobacco history on file. He reports that he does not use illicit drugs.   Family History:  The patient's family history includes Heart disease  in his sister and sister; Hypertension in his brother, brother, brother, brother, father, mother, sister, sister, and sister; Sudden death in his father.    ROS:   Please see the history of present illness.   Review of Systems  All other systems reviewed and are negative.     PHYSICAL EXAM: VS:  BP 110/58 mmHg  Pulse 69  Ht 5\' 8"  (1.727 m)  Wt 174 lb (78.926 kg)  BMI 26.46 kg/m2    Wt Readings from Last 3 Encounters:  11/24/14 174 lb (78.926 kg)  11/17/14 171 lb 8 oz (77.792 kg)     GEN: Well nourished, well developed, in no acute distress HEENT: normal Neck: no JVD, no carotid bruits, no masses Cardiac:  Normal S1/S2, RRR; no murmur ,  no rubs or gallops, no edema; L groin without hematoma or bruit Chest:  ICD site well healed without erythema, discharge, hematoma Respiratory:  clear to auscultation bilaterally, no wheezing, rhonchi or rales. GI: soft, nontender, nondistended, + BS MS: no deformity or atrophy Skin: warm and dry  Neuro:  CNs II-XII intact, Strength and sensation are intact Psych: Normal affect   EKG:  EKG is ordered today.  It demonstrates:   NSR, HR 69, LAD, TWI 1, aVL, V5-6, QTc 430 ms   Recent Labs: 11/10/2014: B Natriuretic Peptide 136.7* 11/13/2014: ALT 145* 11/15/2014: Hemoglobin 12.0*; Platelets 191 11/17/2014: BUN 18; Creatinine, Ser 1.08; Magnesium 2.2; Potassium 4.8; Sodium 138    Lipid Panel    Component Value Date/Time   TRIG 165* 11/12/2014 1100      ASSESSMENT AND PLAN:  Chronic Systolic CHF:  Patient is s/p OOH VF arrest in the setting of severe HypoK+ and NICM.  He was successfully resuscitated and had full neuro recovery.  He is doing well since DC.  He is NYHA 2.  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  I would prefer that he refrain from returning to work for now.  We could eventually let him return at light duty status in the next couple of mos.    -  Continue current dose of Lasix.   -  FU BMET, Mg2+ today.  -  Consider adding  Spironolactone over time.  -  Letter for work given to remain out at least until seen in FU in the next 3-4 weeks.  NICM:  Continue beta-blocker, ACE inhibitor.  Consider Spironolactone over time.  VTach:  Continue beta-blocker.  FU with EP as planned.  No driving for 6 mos.   S/p ICD:  FU with EP as planned.  Wound looks good today.  HTN:  Controlled.   Hyperlipidemia:  Continue statin.  FU by PCP.     Medication  Changes: Current medicines are reviewed at length with the patient today.  Concerns regarding medicines are as outlined above.  The following changes have been made:   Discontinued Medications   No medications on file   Modified Medications   No medications on file   New Prescriptions   No medications on file    Labs/ tests ordered today include:   Orders Placed This Encounter  Procedures  . Basic Metabolic Panel (BMET)  . Magnesium  . EKG 12-Lead      Disposition:    FU with [name] in HF Clinic in 3-4 weeks.     Signed, Brynda Rim, MHS 11/24/2014 2:42 PM    [facility] [address] [place], [place], [place]  [address] Phone: [phone]; Fax: [phone]

## 2014-11-24 ENCOUNTER — Encounter: Payer: Self-pay | Admitting: Physician Assistant

## 2014-11-24 ENCOUNTER — Ambulatory Visit (INDEPENDENT_AMBULATORY_CARE_PROVIDER_SITE_OTHER): Payer: BLUE CROSS/BLUE SHIELD | Admitting: Physician Assistant

## 2014-11-24 VITALS — BP 110/58 | HR 69 | Ht 68.0 in | Wt 174.0 lb

## 2014-11-24 DIAGNOSIS — I5022 Chronic systolic (congestive) heart failure: Secondary | ICD-10-CM

## 2014-11-24 DIAGNOSIS — Z95810 Presence of automatic (implantable) cardiac defibrillator: Secondary | ICD-10-CM | POA: Diagnosis not present

## 2014-11-24 DIAGNOSIS — I472 Ventricular tachycardia, unspecified: Secondary | ICD-10-CM

## 2014-11-24 DIAGNOSIS — I10 Essential (primary) hypertension: Secondary | ICD-10-CM

## 2014-11-24 DIAGNOSIS — E785 Hyperlipidemia, unspecified: Secondary | ICD-10-CM

## 2014-11-24 DIAGNOSIS — I428 Other cardiomyopathies: Secondary | ICD-10-CM

## 2014-11-24 LAB — BASIC METABOLIC PANEL
BUN: 17 mg/dL (ref 6–23)
CO2: 29 mEq/L (ref 19–32)
Calcium: 9.5 mg/dL (ref 8.4–10.5)
Chloride: 101 mEq/L (ref 96–112)
Creatinine, Ser: 1.04 mg/dL (ref 0.40–1.50)
GFR: 78.91 mL/min (ref >60.00)
Glucose, Bld: 82 mg/dL (ref 70–99)
Potassium: 4.2 mEq/L (ref 3.5–5.1)
Sodium: 139 mEq/L (ref 135–145)

## 2014-11-24 LAB — MAGNESIUM: Magnesium: 2 mg/dL (ref 1.5–2.5)

## 2014-11-24 NOTE — Patient Instructions (Signed)
Medication Instructions:  Your physician recommends that you continue on your current medications as directed. Please refer to the Current Medication list given to you today.   Labwork: TODAY BMET, MAGNESIUM LEVEL  Testing/Procedures: NONE  Follow-Up: 3-4 WEEKS WITH DR. Shirlee Latch [facility]  Any Other Special Instructions Will Be Listed Below (If Applicable).

## 2014-11-25 ENCOUNTER — Telehealth: Payer: Self-pay | Admitting: *Deleted

## 2014-11-25 DIAGNOSIS — N179 Acute kidney failure, unspecified: Secondary | ICD-10-CM

## 2014-11-25 NOTE — Telephone Encounter (Signed)
Pt and his wife called back and both have been notified of normal lab work by phone with verbal understanding by both.

## 2014-11-26 ENCOUNTER — Ambulatory Visit (INDEPENDENT_AMBULATORY_CARE_PROVIDER_SITE_OTHER): Payer: BLUE CROSS/BLUE SHIELD | Admitting: *Deleted

## 2014-11-26 DIAGNOSIS — I428 Other cardiomyopathies: Secondary | ICD-10-CM

## 2014-11-26 DIAGNOSIS — I429 Cardiomyopathy, unspecified: Secondary | ICD-10-CM | POA: Diagnosis not present

## 2014-11-26 DIAGNOSIS — I5023 Acute on chronic systolic (congestive) heart failure: Secondary | ICD-10-CM

## 2014-11-26 DIAGNOSIS — I469 Cardiac arrest, cause unspecified: Secondary | ICD-10-CM

## 2014-11-26 DIAGNOSIS — I4729 Other ventricular tachycardia: Secondary | ICD-10-CM

## 2014-11-26 DIAGNOSIS — I472 Ventricular tachycardia: Secondary | ICD-10-CM | POA: Diagnosis not present

## 2014-11-26 LAB — CUP PACEART INCLINIC DEVICE CHECK
Battery Remaining Longevity: 11.4
Brady Statistic RV Percent Paced: 0.1 % — CL
Date Time Interrogation Session: 20160908114612
HighPow Impedance: 62 Ohm
Lead Channel Impedance Value: 437 Ohm
Lead Channel Pacing Threshold Amplitude: 0.5 V
Lead Channel Pacing Threshold Pulse Width: 0.4 ms
Lead Channel Sensing Intrinsic Amplitude: 16.6 mV
Lead Channel Setting Pacing Amplitude: 3.5 V
Lead Channel Setting Pacing Pulse Width: 0.4 ms
Lead Channel Setting Sensing Sensitivity: 0.3 mV

## 2014-11-26 NOTE — Progress Notes (Signed)
Wound check appointment. Wound without redness or edema. Incision edges approximated, wound well healed. Normal device function. Threshold, sensing, and impedances consistent with implant measurements. Device programmed at 3.5V for extra safety margin until 3 month visit. Histogram distribution appropriate for patient and level of activity. No mode switches or ventricular arrhythmias noted. Patient educated about wound care, arm mobility, lifting restrictions, shock plan. ROV in 3 months with SK.

## 2014-12-03 ENCOUNTER — Telehealth: Payer: Self-pay | Admitting: Internal Medicine

## 2014-12-03 NOTE — Telephone Encounter (Signed)
[employer] CALL[employer] NEEDING MEDICAL UPDATE ON THIS PATIENT-PLS CALL [phone] #3 CUSTOMER SERVICE

## 2014-12-10 ENCOUNTER — Encounter: Payer: Self-pay | Admitting: Internal Medicine

## 2014-12-24 ENCOUNTER — Encounter (HOSPITAL_COMMUNITY): Payer: Self-pay

## 2014-12-24 ENCOUNTER — Ambulatory Visit (HOSPITAL_COMMUNITY)
Admission: RE | Admit: 2014-12-24 | Discharge: 2014-12-24 | Disposition: A | Discharge status: Home / Self Care | Payer: BLUE CROSS/BLUE SHIELD | Source: Ambulatory Visit | Attending: Cardiology | Admitting: Cardiology

## 2014-12-24 VITALS — BP 130/88 | HR 75 | Ht 68.0 in | Wt 180.8 lb

## 2014-12-24 DIAGNOSIS — Z95810 Presence of automatic (implantable) cardiac defibrillator: Secondary | ICD-10-CM | POA: Insufficient documentation

## 2014-12-24 DIAGNOSIS — Z7982 Long term (current) use of aspirin: Secondary | ICD-10-CM | POA: Insufficient documentation

## 2014-12-24 DIAGNOSIS — Z79899 Other long term (current) drug therapy: Secondary | ICD-10-CM | POA: Diagnosis not present

## 2014-12-24 DIAGNOSIS — I10 Essential (primary) hypertension: Secondary | ICD-10-CM | POA: Diagnosis not present

## 2014-12-24 DIAGNOSIS — E785 Hyperlipidemia, unspecified: Secondary | ICD-10-CM | POA: Insufficient documentation

## 2014-12-24 DIAGNOSIS — I5022 Chronic systolic (congestive) heart failure: Secondary | ICD-10-CM | POA: Insufficient documentation

## 2014-12-24 DIAGNOSIS — Z8674 Personal history of sudden cardiac arrest: Secondary | ICD-10-CM | POA: Insufficient documentation

## 2014-12-24 DIAGNOSIS — I469 Cardiac arrest, cause unspecified: Secondary | ICD-10-CM | POA: Diagnosis not present

## 2014-12-24 DIAGNOSIS — I428 Other cardiomyopathies: Secondary | ICD-10-CM | POA: Insufficient documentation

## 2014-12-24 DIAGNOSIS — Z8249 Family history of ischemic heart disease and other diseases of the circulatory system: Secondary | ICD-10-CM | POA: Diagnosis not present

## 2014-12-24 LAB — BASIC METABOLIC PANEL
Anion gap: 5 (ref 5–15)
BUN: 11 mg/dL (ref 6–20)
CO2: 27 mmol/L (ref 22–32)
Calcium: 9.3 mg/dL (ref 8.9–10.3)
Chloride: 106 mmol/L (ref 101–111)
Creatinine, Ser: 0.97 mg/dL (ref 0.61–1.24)
GFR calc Af Amer: >60 mL/min (ref >60)
GFR calc non Af Amer: >60 mL/min (ref >60)
Glucose, Bld: 93 mg/dL (ref 65–99)
Potassium: 4.1 mmol/L (ref 3.5–5.1)
Sodium: 138 mmol/L (ref 135–145)

## 2014-12-24 LAB — BRAIN NATRIURETIC PEPTIDE: B Natriuretic Peptide: 376.3 pg/mL — ABNORMAL HIGH (ref 0.0–100.0)

## 2014-12-24 MED ORDER — SPIRONOLACTONE 25 MG PO TABS: 12.5000 mg | ORAL_TABLET | Freq: Every day | ORAL | Status: DC

## 2014-12-24 MED ORDER — SACUBITRIL-VALSARTAN 24-26 MG PO TABS: 1.0000 | ORAL_TABLET | Freq: Two times a day (BID) | ORAL | Status: DC

## 2014-12-24 NOTE — Patient Instructions (Signed)
Stop Lisinopril  Start Entresto 24/26 mg Twice daily STARTING Saturday 10/8 AM  Start Spironolactone 12.5 mg (1/2 tab) daily  Labs today  Labs in 10 days  Your physician has requested that you have an echocardiogram. Echocardiography is a painless test that uses sound waves to create images of your heart. It provides your doctor with information about the size and shape of your heart and how well your heart's chambers and valves are working. This procedure takes approximately one hour. There are no restrictions for this procedure.  You have been referred to Cardiac Rehab, they will contact you to schedule  Your physician recommends that you schedule a follow-up appointment in: 3 weeks

## 2014-12-25 LAB — PROTEIN ELECTROPHORESIS, SERUM
A/G Ratio: 1.3 (ref 0.7–1.7)
Albumin ELP: 4 g/dL (ref 2.9–4.4)
Alpha-1-Globulin: 0.2 g/dL (ref 0.0–0.4)
Alpha-2-Globulin: 0.7 g/dL (ref 0.4–1.0)
Beta Globulin: 1.2 g/dL (ref 0.7–1.3)
Gamma Globulin: 0.9 g/dL (ref 0.4–1.8)
Globulin, Total: 3 g/dL (ref 2.2–3.9)
Total Protein ELP: 7 g/dL (ref 6.0–8.5)

## 2014-12-25 NOTE — Telephone Encounter (Signed)
Returned call  Three local pharmacies out of Entresto 24/26 mg  Until at least Monday  2 sample bottles left for pick up of 24/26 mg of Entresto

## 2014-12-27 DIAGNOSIS — I5022 Chronic systolic (congestive) heart failure: Secondary | ICD-10-CM | POA: Insufficient documentation

## 2014-12-27 DIAGNOSIS — I110 Hypertensive heart disease with heart failure: Secondary | ICD-10-CM | POA: Insufficient documentation

## 2014-12-27 NOTE — Progress Notes (Addendum)
Patient ID: Brian Noble, male   DOB: [birth year], 54 y.o.   MRN: [id number] PCP: Dr. Marina Goodell Cardiology: Dr Shirlee Latch  54 yo with a prior history of cardiomyopathy who developed a cardiac arrest and was found to have low EF and normal coronaries.  Per patient, he was found to have low EF around 20% about 8 years ago.  He was seen by a cardiologist in [place] (he thinks), and EF went back to normal range.  He had no problems up until 8/16.  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  He was noted to pass out by family.  EMS was called and arrived quickly.  He was in either ventricular fibrillation or VT and was defibrillated.  He was cooled and sent for coronary angiography, which showed no significant coronary disease.  Echo showed EF 10% with severe RV dysfunction.  After recovery, he got a Medtronic ICD.    Currently doing fairly well.  He has had a chronic dry cough for about a month now.  He is not short of breath walking on flat ground.  He does have some general fatigue.  He can carry loads and do yardwork.  No chest pain, no lightheadedness and palpitations.    ECG: NSR, LAFB, LVH (QRS 118 msec)  Labs (9/16): K 4.2, creatinine 1.04  PMH: 1. LBBB 2. HTN: x years 3. Hyperlipidemia 4. Chronic systolic CHF: Patient was told around 2008 that his EF was about 20%.  He says that it recovered back to normal.  He thinks he may have seen a cardiologist in [place] at that time. On 11/10/14, he was admitted after having ventricular fibrillation versus VT arrest and being shocked by AED.   - LHC (8/16): Normal coronaries, EF < 25%. - Echo (8/16): EF 10%, diffuse hypokinesis, mild MR, severely decreased RV systolic function.  - Medtronic ICD (8/16).    FH: Sister with heart transplant, father with cardiac arrest when about 74, 2 other sisters with "heart problems."  He has 10 siblings in all.   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  ROS: All  systems reviewed and negative except as per HPI.   Current Outpatient Prescriptions  Medication Sig Dispense Refill  . aspirin EC 81 MG tablet Take 81 mg by mouth daily.    . carvedilol (COREG) 6.25 MG tablet Take 6.25 mg by mouth 2 (two) times daily.    . furosemide (LASIX) 40 MG tablet Take 1 tablet (40 mg total) by mouth daily. 30 tablet 0  . lovastatin (MEVACOR) 20 MG tablet Take 20 mg by mouth daily.    . magnesium oxide (MAG-OX) 400 MG tablet Take 1 tablet (400 mg total) by mouth 2 (two) times daily. 60 tablet 0  . Potassium Chloride ER 20 MEQ TBCR Take 20 mEq by mouth daily. 30 tablet 0  . sacubitril-valsartan (ENTRESTO) 24-26 MG Take 1 tablet by mouth 2 (two) times daily. 60 tablet 6  . spironolactone (ALDACTONE) 25 MG tablet Take 0.5 tablets (12.5 mg total) by mouth daily. 15 tablet 3   No current facility-administered medications for this encounter.   BP 130/88 mmHg  Pulse 75  Ht 5\' 8"  (1.727 m)  Wt 180 lb 12.8 oz (82.01 kg)  BMI 27.50 kg/m2  SpO2 97% General: NAD Neck: No JVD, no thyromegaly or thyroid nodule.  Lungs: Clear to auscultation bilaterally with normal respiratory effort. CV: Nondisplaced PMI.  Heart regular S1/S2,  no S3/S4, 1/6 SEM RUSB.  No peripheral edema.  No carotid bruit.  Normal pedal pulses.  Abdomen: Soft, nontender, no hepatosplenomegaly, no distention.  Skin: Intact without lesions or rashes.  Neurologic: Alert and oriented x 3.  Psych: Normal affect. Extremities: No clubbing or cyanosis.  HEENT: Normal.   Assessment/Plan: 1. Chronic systolic CHF: Nonischemic cardiomyopathy.  Medtronic ICD.  Most recent ECGs have not shown a wide QRS so did not get CRT.  Possibly familial CMP given father with SCD at 49, sister with heart transplant, and 2 other sisters with "heart problems."  Cannot rule out prior viral myocarditis or role for HTN. NYHA class II symptoms currently, stable volume status.  - Check SPEP/UPEP, BMET, and BNP.  - Stop lisinopril for 36  hrs (may be causing cough) and start Entresto 24/26 bid.   - Start spironolactone 12.5 mg daily.   - repeat BMET in 10 days.   - I will arrange for cardiac rehab.  - Repeat echo in 11/16 to look for improvement.  - Given possible familial CMP, I recommended that Mr Mcgue siblings all be screened by echo.  He has 10 siblings. 2. Cardiac arrest: Now has Medtronic ICD. No driving for 6 months post-arrest (2/17).  3. I think it would be ok to go back to work in about a month, would ease into full time work.   Followup in 3 wks.

## 2014-12-27 NOTE — Addendum Note (Signed)
Encounter addended by: Laurey Morale, MD on: 12/27/2014  1:22 AM<BR>     Documentation filed: Notes Section

## 2014-12-28 LAB — PROTEIN ELECTRO, RANDOM URINE
Albumin ELP, Urine: 100 %
Alpha-1-Globulin, U: 0 %
Alpha-2-Globulin, U: 0 %
Beta Globulin, U: 0 %
Gamma Globulin, U: 0 %
Total Protein, Urine: 4.3 mg/dL

## 2015-01-04 ENCOUNTER — Ambulatory Visit (HOSPITAL_COMMUNITY)
Admission: RE | Admit: 2015-01-04 | Discharge: 2015-01-04 | Disposition: A | Discharge status: Home / Self Care | Payer: BLUE CROSS/BLUE SHIELD | Source: Ambulatory Visit | Attending: Cardiology | Admitting: Cardiology

## 2015-01-04 DIAGNOSIS — I5022 Chronic systolic (congestive) heart failure: Secondary | ICD-10-CM | POA: Diagnosis present

## 2015-01-04 LAB — BASIC METABOLIC PANEL
Anion gap: 10 (ref 5–15)
BUN: 16 mg/dL (ref 6–20)
CO2: 26 mmol/L (ref 22–32)
Calcium: 9.6 mg/dL (ref 8.9–10.3)
Chloride: 104 mmol/L (ref 101–111)
Creatinine, Ser: 1.09 mg/dL (ref 0.61–1.24)
GFR calc Af Amer: >60 mL/min (ref >60)
GFR calc non Af Amer: >60 mL/min (ref >60)
Glucose, Bld: 121 mg/dL — ABNORMAL HIGH (ref 65–99)
Potassium: 4.6 mmol/L (ref 3.5–5.1)
Sodium: 140 mmol/L (ref 135–145)

## 2015-01-18 ENCOUNTER — Ambulatory Visit (HOSPITAL_COMMUNITY)
Admission: RE | Admit: 2015-01-18 | Discharge: 2015-01-18 | Disposition: A | Discharge status: Home / Self Care | Payer: BLUE CROSS/BLUE SHIELD | Source: Ambulatory Visit | Attending: Cardiology | Admitting: Cardiology

## 2015-01-18 VITALS — BP 120/78 | HR 69 | Wt 180.0 lb

## 2015-01-18 DIAGNOSIS — I110 Hypertensive heart disease with heart failure: Secondary | ICD-10-CM | POA: Diagnosis not present

## 2015-01-18 DIAGNOSIS — Z95810 Presence of automatic (implantable) cardiac defibrillator: Secondary | ICD-10-CM | POA: Diagnosis not present

## 2015-01-18 DIAGNOSIS — Z8249 Family history of ischemic heart disease and other diseases of the circulatory system: Secondary | ICD-10-CM | POA: Diagnosis not present

## 2015-01-18 DIAGNOSIS — I5022 Chronic systolic (congestive) heart failure: Secondary | ICD-10-CM | POA: Diagnosis not present

## 2015-01-18 DIAGNOSIS — Z79899 Other long term (current) drug therapy: Secondary | ICD-10-CM | POA: Diagnosis not present

## 2015-01-18 DIAGNOSIS — Z8674 Personal history of sudden cardiac arrest: Secondary | ICD-10-CM | POA: Diagnosis not present

## 2015-01-18 DIAGNOSIS — I428 Other cardiomyopathies: Secondary | ICD-10-CM | POA: Insufficient documentation

## 2015-01-18 DIAGNOSIS — Z7982 Long term (current) use of aspirin: Secondary | ICD-10-CM | POA: Insufficient documentation

## 2015-01-18 DIAGNOSIS — E785 Hyperlipidemia, unspecified: Secondary | ICD-10-CM | POA: Diagnosis not present

## 2015-01-18 MED ORDER — CARVEDILOL 6.25 MG PO TABS: 9.3750 mg | ORAL_TABLET | Freq: Two times a day (BID) | ORAL | Status: DC

## 2015-01-18 MED ORDER — SPIRONOLACTONE 25 MG PO TABS: 25.0000 mg | ORAL_TABLET | Freq: Every day | ORAL | Status: DC

## 2015-01-18 NOTE — Progress Notes (Signed)
Patient ID: Brian Noble, male   DOB: [birth year], 54 y.o.   MRN: [id number] PCP: Dr. Marina Goodell Cardiology: Dr Shirlee Latch  54 yo with a prior history of cardiomyopathy who developed a cardiac arrest and was found to have low EF and normal coronaries.  Per patient, he was found to have low EF around 20% about 8 years ago.  He was seen by a cardiologist in [place] (he thinks), and EF went back to normal range.  He had no problems up until 8/16.  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  He was noted to pass out by family.  EMS was called and arrived quickly.  He was in either ventricular fibrillation or VT and was defibrillated.  He was cooled and sent for coronary angiography, which showed no significant coronary disease.  Echo showed EF 10% with severe RV dysfunction.  After recovery, he got a Medtronic ICD.    Today he returns for HF follow up. Wants to go back to work. Overall feeling ok. Denies SOB/PND/Orthopnea/CP. Able to lift 25 pounds. Very active on his farm. Taking all medications.  Weight is stable.   Labs (9/16): K 4.2, creatinine 1.04 Labs (01/04/2015) : K 4.6 Creatinine 1.09, SPEP negative  Optivol: Fluid index < threshold with stable impedance.   PMH: 1. LBBB 2. HTN: x years 3. Hyperlipidemia 4. Chronic systolic CHF: Patient was told around 2008 that his EF was about 20%.  He says that it recovered back to normal.  He thinks he may have seen a cardiologist in [place] at that time. On 11/10/14, he was admitted after having ventricular fibrillation versus VT arrest and being shocked by AED.   - LHC (8/16): Normal coronaries, EF < 25%. - Echo (8/16): EF 10%, diffuse hypokinesis, mild MR, severely decreased RV systolic function.  - Medtronic ICD (8/16).    FH: Sister with heart transplant, father with cardiac arrest when about 52, 2 other sisters with "heart problems."  He has 10 siblings in all.   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  ROS: All systems reviewed and negative except as per HPI.   Current Outpatient Prescriptions  Medication Sig Dispense Refill  . aspirin EC 81 MG tablet Take 81 mg by mouth daily.    . carvedilol (COREG) 6.25 MG tablet Take 6.25 mg by mouth 2 (two) times daily.    . furosemide (LASIX) 40 MG tablet Take 1 tablet (40 mg total) by mouth daily. 30 tablet 0  . lovastatin (MEVACOR) 20 MG tablet Take 20 mg by mouth daily.    . magnesium oxide (MAG-OX) 400 MG tablet Take 1 tablet (400 mg total) by mouth 2 (two) times daily. 60 tablet 0  . Potassium Chloride ER 20 MEQ TBCR Take 20 mEq by mouth daily. 30 tablet 0  . sacubitril-valsartan (ENTRESTO) 24-26 MG Take 1 tablet by mouth 2 (two) times daily. 60 tablet 6  . spironolactone (ALDACTONE) 25 MG tablet Take 0.5 tablets (12.5 mg total) by mouth daily. 15 tablet 3   No current facility-administered medications for this encounter.   BP 120/78 mmHg  Pulse 69  Wt 180 lb (81.647 kg)  SpO2 98% General: NAD Neck: No JVD, no thyromegaly or thyroid nodule.  Lungs: Clear to auscultation bilaterally with normal respiratory effort. CV: Nondisplaced PMI.  Heart regular S1/S2, no S3/S4, 1/6 SEM RUSB.  No peripheral edema.  No carotid bruit.  Normal pedal pulses.  Abdomen: Soft, nontender, no hepatosplenomegaly, no distention.  Skin: Intact without lesions or rashes.  Neurologic: Alert and oriented x 3.  Psych: Normal affect. Extremities: No clubbing or cyanosis.  HEENT: Normal.   Assessment/Plan: 1. Chronic systolic CHF: Nonischemic cardiomyopathy.  Medtronic ICD.  Most recent ECGs have not shown a wide QRS so did not get CRT.  Possibly familial CMP given father with SCD at 8, sister with heart transplant, and 2 other sisters with "heart problems."  Cannot rule out prior viral myocarditis or role for HTN. NYHA class II symptoms currently, stable volume status.  - Increase carvedilol 9.375 mg twice a day.  - Continue Entresto 24/26 bid.   -  Increase spironolactone to 25 mg daily. BMET in 2 wks.   - Due to transportation he is unable to start cardiac rehab.   - Repeat echo in 11/16 to look for improvement.  - Given possible familial CMP, I recommended that Mr Chavira siblings all be screened by echo.  He has 10 siblings. 2. Cardiac arrest: Now has Medtronic ICD. Has follow up with Dr Graciela Husbands. No driving for 6 months post-arrest (2/17).   Follow up in 4 wks to discuss ECHO results.   Amy Clegg NP-C  12:29 PM   Patient seen with NP, agree with the above note.  He is doing quite well.  NYHA class I-II.  Ok to go back to work but no driving until 0/35.  He can increase Coreg to 9.375 mg bid and spironolactone to 25 mg daily.  Repeat BMET in 2 wks and echo to be done in 11/16.   Marca Ancona 01/19/2015

## 2015-01-18 NOTE — Patient Instructions (Signed)
Increase Carvedilol to 9.375 mg (1 & 1/2 tabs) Twice daily   Increase Spironolactone to 25 mg (1 tab) daily  Labs in 2 weeks  Your physician recommends that you schedule a follow-up appointment in: 1 month

## 2015-01-25 ENCOUNTER — Encounter: Payer: Self-pay | Admitting: Cardiology

## 2015-01-25 ENCOUNTER — Other Ambulatory Visit (HOSPITAL_COMMUNITY): Payer: Self-pay

## 2015-01-27 ENCOUNTER — Ambulatory Visit (HOSPITAL_COMMUNITY)
Admission: RE | Admit: 2015-01-27 | Discharge: 2015-01-27 | Disposition: A | Discharge status: Home / Self Care | Payer: BLUE CROSS/BLUE SHIELD | Source: Ambulatory Visit | Attending: Internal Medicine | Admitting: Internal Medicine

## 2015-01-27 DIAGNOSIS — I5022 Chronic systolic (congestive) heart failure: Secondary | ICD-10-CM | POA: Insufficient documentation

## 2015-01-27 LAB — BASIC METABOLIC PANEL
Anion gap: 12 (ref 5–15)
BUN: 16 mg/dL (ref 6–20)
CO2: 23 mmol/L (ref 22–32)
Calcium: 10 mg/dL (ref 8.9–10.3)
Chloride: 103 mmol/L (ref 101–111)
Creatinine, Ser: 1.29 mg/dL — ABNORMAL HIGH (ref 0.61–1.24)
GFR calc Af Amer: >60 mL/min (ref >60)
GFR calc non Af Amer: >60 mL/min (ref >60)
Glucose, Bld: 92 mg/dL (ref 65–99)
Potassium: 5.5 mmol/L — ABNORMAL HIGH (ref 3.5–5.1)
Sodium: 138 mmol/L (ref 135–145)

## 2015-02-10 ENCOUNTER — Encounter (HOSPITAL_COMMUNITY): Payer: Self-pay

## 2015-02-10 ENCOUNTER — Other Ambulatory Visit: Payer: Self-pay

## 2015-02-10 ENCOUNTER — Ambulatory Visit (HOSPITAL_COMMUNITY): Payer: BLUE CROSS/BLUE SHIELD | Attending: Cardiology

## 2015-02-10 DIAGNOSIS — I340 Nonrheumatic mitral (valve) insufficiency: Secondary | ICD-10-CM | POA: Diagnosis not present

## 2015-02-10 DIAGNOSIS — I517 Cardiomegaly: Secondary | ICD-10-CM | POA: Diagnosis not present

## 2015-02-10 DIAGNOSIS — I5022 Chronic systolic (congestive) heart failure: Secondary | ICD-10-CM | POA: Diagnosis not present

## 2015-02-10 DIAGNOSIS — I77810 Thoracic aortic ectasia: Secondary | ICD-10-CM | POA: Diagnosis not present

## 2015-02-15 ENCOUNTER — Telehealth (HOSPITAL_COMMUNITY): Payer: Self-pay

## 2015-02-15 NOTE — Telephone Encounter (Signed)
Gave patient results for echo

## 2015-02-17 ENCOUNTER — Encounter (HOSPITAL_COMMUNITY): Payer: Self-pay

## 2015-02-17 ENCOUNTER — Ambulatory Visit (HOSPITAL_COMMUNITY)
Admission: RE | Admit: 2015-02-17 | Discharge: 2015-02-17 | Disposition: A | Discharge status: Home / Self Care | Payer: BLUE CROSS/BLUE SHIELD | Source: Ambulatory Visit | Attending: Cardiology | Admitting: Cardiology

## 2015-02-17 VITALS — BP 106/62 | HR 69 | Wt 184.8 lb

## 2015-02-17 DIAGNOSIS — E785 Hyperlipidemia, unspecified: Secondary | ICD-10-CM | POA: Insufficient documentation

## 2015-02-17 DIAGNOSIS — Z7982 Long term (current) use of aspirin: Secondary | ICD-10-CM | POA: Diagnosis not present

## 2015-02-17 DIAGNOSIS — Z79899 Other long term (current) drug therapy: Secondary | ICD-10-CM | POA: Diagnosis not present

## 2015-02-17 DIAGNOSIS — I469 Cardiac arrest, cause unspecified: Secondary | ICD-10-CM

## 2015-02-17 DIAGNOSIS — I429 Cardiomyopathy, unspecified: Secondary | ICD-10-CM | POA: Insufficient documentation

## 2015-02-17 DIAGNOSIS — I10 Essential (primary) hypertension: Secondary | ICD-10-CM | POA: Diagnosis not present

## 2015-02-17 DIAGNOSIS — I5022 Chronic systolic (congestive) heart failure: Secondary | ICD-10-CM | POA: Insufficient documentation

## 2015-02-17 LAB — BASIC METABOLIC PANEL
Anion gap: 10 (ref 5–15)
BUN: 19 mg/dL (ref 6–20)
CO2: 24 mmol/L (ref 22–32)
Calcium: 9.7 mg/dL (ref 8.9–10.3)
Chloride: 105 mmol/L (ref 101–111)
Creatinine, Ser: 1.2 mg/dL (ref 0.61–1.24)
GFR calc Af Amer: >60 mL/min (ref >60)
GFR calc non Af Amer: >60 mL/min (ref >60)
Glucose, Bld: 105 mg/dL — ABNORMAL HIGH (ref 65–99)
Potassium: 5 mmol/L (ref 3.5–5.1)
Sodium: 139 mmol/L (ref 135–145)

## 2015-02-17 LAB — MAGNESIUM: Magnesium: 2.2 mg/dL (ref 1.7–2.4)

## 2015-02-17 LAB — BRAIN NATRIURETIC PEPTIDE: B Natriuretic Peptide: 142 pg/mL — ABNORMAL HIGH (ref 0.0–100.0)

## 2015-02-17 MED ORDER — CARVEDILOL 12.5 MG PO TABS: 12.5000 mg | ORAL_TABLET | Freq: Two times a day (BID) | ORAL | Status: DC

## 2015-02-17 NOTE — Patient Instructions (Signed)
Increase Carvedilol to 12.5 mg Twice daily, we have sent you in a new prescription for 12.5 mg tablets  Labs today  We will contact you in 2 months to schedule your next appointment.

## 2015-02-17 NOTE — Progress Notes (Signed)
Patient ID: ASPYN SCHRAG, male   DOB: 10-Aug-1960, 54 y.o.   MRN: [id number] PCP: Dr. Marina Goodell Cardiology: Dr Shirlee Latch  54 yo with a prior history of cardiomyopathy who developed a cardiac arrest and was found to have low EF and normal coronaries.  Per patient, he was found to have low EF around 20% about 8 years ago.  He was seen by a cardiologist in [place] (he thinks), and EF went back to normal range.  He had no problems up until 8/16.  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  He was noted to pass out by family.  EMS was called and arrived quickly.  He was in either ventricular fibrillation or VT and was defibrillated.  He was cooled and sent for coronary angiography, which showed no significant coronary disease.  Echo showed EF 10% with severe RV dysfunction.  After recovery, he got a Medtronic ICD.  Repeat echo in 11/16 showed EF 20% with septal-lateral dyssynchrony, moderately decreased RV systolic function.  Today he returns for HF follow up.  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  No exertional dyspnea or chest pain.  No lightheadedness.  No orthopnea/PND.   Labs (9/16): K 4.2, creatinine 1.04 Labs (01/04/2015) : K 4.6 Creatinine 1.09, SPEP negative Labs (11/16): K 5.5, creatinine 1.29  Optivol: Fluid index < threshold with stable impedance.   ECG: NSR, LBBB with QRS 134  PMH: 1. LBBB 2. HTN: x years 3. Hyperlipidemia 4. Chronic systolic CHF: Patient was told around 2008 that his EF was about 20%.  He says that it recovered back to normal.  He thinks he may have seen a cardiologist in [place] at that time. On 11/10/14, he was admitted after having ventricular fibrillation versus VT arrest and being shocked by AED.   - LHC (8/16): Normal coronaries, EF < 25%. - Echo (8/16): EF 10%, diffuse hypokinesis, mild MR, severely decreased RV systolic function.  - Medtronic ICD (8/16).   - Echo (11/16): EF 20%, septal-lateral dyssynchrony, mildly  dilated LV with mild LVH, moderately decreased RV systolic function.   FH: Sister with heart transplant, father with cardiac arrest when about 18, 2 other sisters with "heart problems."  He has 10 siblings in all.   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  ROS: All systems reviewed and negative except as per HPI.   Current Outpatient Prescriptions  Medication Sig Dispense Refill  . aspirin EC 81 MG tablet Take 81 mg by mouth daily.    . carvedilol (COREG) 12.5 MG tablet Take 1 tablet (12.5 mg total) by mouth 2 (two) times daily. 60 tablet 6  . furosemide (LASIX) 40 MG tablet Take 1 tablet (40 mg total) by mouth daily. 30 tablet 0  . lovastatin (MEVACOR) 20 MG tablet Take 20 mg by mouth daily.    . magnesium oxide (MAG-OX) 400 MG tablet Take 1 tablet (400 mg total) by mouth 2 (two) times daily. 60 tablet 0  . sacubitril-valsartan (ENTRESTO) 24-26 MG Take 1 tablet by mouth 2 (two) times daily. 60 tablet 6  . spironolactone (ALDACTONE) 25 MG tablet Take 1 tablet (25 mg total) by mouth daily. 30 tablet 3   No current facility-administered medications for this encounter.   BP 106/62 mmHg  Pulse 69  Wt 184 lb 12.8 oz (83.825 kg)  SpO2 97% General: NAD Neck: No JVD, no thyromegaly or thyroid nodule.  Lungs: Clear to auscultation bilaterally with normal respiratory effort. CV: Nondisplaced PMI.  Heart regular S1/S2, no S3/S4, 1/6 SEM RUSB.  No peripheral edema.  No carotid bruit.  Normal pedal pulses.  Abdomen: Soft, nontender, no hepatosplenomegaly, no distention.  Skin: Intact without lesions or rashes.  Neurologic: Alert and oriented x 3.  Psych: Normal affect. Extremities: No clubbing or cyanosis.  HEENT: Normal.   Assessment/Plan: 1. Chronic systolic CHF: Nonischemic cardiomyopathy.  Medtronic ICD.  Possibly familial CMP given father with SCD at 33, sister with heart transplant, and 2 other sisters with "heart problems."  Cannot rule out  prior viral myocarditis or role for HTN. NYHA class II symptoms currently, stable volume status.  Last echo with EF 20%, septal-lateral dyssynchrony.   - Increase Coreg to 12.5 mg bid.  - Continue Entresto 24/26 bid.   - Continue spironolactone.    - QRS 134 msec with visual septal-lateral dyssynchrony on echo.  I will talk with Dr Graciela Husbands regarding CRT.  - Given possible familial CMP, I recommended that Mr Kitchings siblings all be screened by echo.  He has 10 siblings. - BMET/BNP/magnesium level today.  2. Cardiac arrest: Now has Medtronic ICD. Has follow up with Dr Graciela Husbands. No driving for 6 months post-arrest (2/17).   Followup in 2 months.  Marca Ancona 02/17/2015

## 2015-02-19 ENCOUNTER — Telehealth (HOSPITAL_COMMUNITY): Payer: Self-pay

## 2015-02-19 NOTE — Telephone Encounter (Signed)
Would like to know what he could take over the counter for his cold sx and if Coricidin is ok Advised patient that it would be ok to take Coricidin HBP and to stay away from anything that has ibuprofen or pseudoephedrine in it

## 2015-03-02 ENCOUNTER — Ambulatory Visit (INDEPENDENT_AMBULATORY_CARE_PROVIDER_SITE_OTHER): Payer: BLUE CROSS/BLUE SHIELD | Admitting: Internal Medicine

## 2015-03-02 ENCOUNTER — Encounter: Payer: Self-pay | Admitting: Internal Medicine

## 2015-03-02 VITALS — BP 130/94 | HR 66 | Ht 68.0 in | Wt 183.8 lb

## 2015-03-02 DIAGNOSIS — I469 Cardiac arrest, cause unspecified: Secondary | ICD-10-CM

## 2015-03-02 DIAGNOSIS — I5022 Chronic systolic (congestive) heart failure: Secondary | ICD-10-CM | POA: Diagnosis not present

## 2015-03-02 MED ORDER — CARVEDILOL 12.5 MG PO TABS: ORAL_TABLET | ORAL | Status: DC

## 2015-03-02 NOTE — Progress Notes (Signed)
Patient Care Team: [name], MD as PCP - General (Family Medicine)   HPI  Brian Noble is a 54 y.o. male Seen in follow-up for aborted cardiac arrest in the context of nonischemic cardiomyopathy and hypokalemia. This was 8/16; he received an ICD.  When he saw Dr. DM last 11/16 the issue was raised as to whether he would benefit from CRT given his "left bundle branch block". Echo demonstrated "septal dyssynchrony"  The patient denies chest pain, shortness of breath, nocturnal dyspnea, orthopnea or peripheral edema.  There have been no palpitations, lightheadedness or syncope.   Carvedilol uptitrated recently     Device History: ICD implanted8/16 for VT/VF History of appropriate therapy: No History of AAD therapy: No    Records and Results Reviewed hosp records  Past Medical History  Diagnosis Date  . Hypertension   . Chronic kidney disease   . Arthritis   . NICM (nonischemic cardiomyopathy) (HCC)     a. LHC 8/16:  normal coronary arteries.   . Chronic systolic CHF (congestive heart failure) (HCC)     a. Echo 8/16:  EF 10%, diff HK, Gr 1 DD, mild dilated aortic root, mild MR, mild LAE, severely reduced RVSF  . VF (ventricular fibrillation) (HCC)     s/p OOH cardiac arrest 8/16 >> s/p ICD  . ICD (implantable cardioverter-defibrillator) in place     Past Surgical History  Procedure Laterality Date  . Cardiac catheterization N/A 11/10/2014    Procedure: Left Heart Cath and Coronary Angiography;  Surgeon: [name], MD;  Location: [facility] INVASIVE CV LAB;  Service: Cardiovascular;  Laterality: N/A;  . Appendectomy    . Ep implantable device N/A 11/16/2014    Procedure: ICD Implant;  Surgeon: [name], MD;  Location: [facility] INVASIVE CV LAB;  Service: Cardiovascular;  Laterality: N/A;    Current Outpatient Prescriptions  Medication Sig Dispense Refill  . aspirin EC 81 MG tablet Take 81 mg by mouth daily.    . carvedilol (COREG) 12.5 MG tablet  Take 1 tablet (12.5 mg total) by mouth 2 (two) times daily. 60 tablet 6  . furosemide (LASIX) 40 MG tablet Take 1 tablet (40 mg total) by mouth daily. 30 tablet 0  . lovastatin (MEVACOR) 20 MG tablet Take 20 mg by mouth daily.    . magnesium oxide (MAG-OX) 400 MG tablet Take 1 tablet (400 mg total) by mouth 2 (two) times daily. 60 tablet 0  . sacubitril-valsartan (ENTRESTO) 24-26 MG Take 1 tablet by mouth 2 (two) times daily. 60 tablet 6  . spironolactone (ALDACTONE) 25 MG tablet Take 1 tablet (25 mg total) by mouth daily. 30 tablet 3   No current facility-administered medications for this visit.    No Known Allergies    Review of Systems negative except from HPI and PMH  Physical Exam BP 130/94 mmHg  Pulse 66  Ht 5\' 8"  (1.727 m)  Wt 183 lb 12.8 oz (83.371 kg)  BMI 27.95 kg/m2 Well developed and well nourished in no acute distress HENT normal E scleral and icterus clear Neck Supple JVP flat; carotids brisk and full Clear to ausculation Device pocket well healed; without hematoma or erythema.  There is no tethering small keloid Regular rate and rhythm, no murmurs gallops or rub Soft with active bowel sounds No clubbing cyanosis  Edema Alert and oriented, grossly normal motor and sensory function Skin Warm and Dry  ECG 11/30 was reviewed. Sinus rhythm at 68 Intervals  20/13/43 Axis left at -52 IVCD  Assessment and  Plan  Aborted cardiac arrest  Ventricular tachycardia-nonsustained  Nonischemic cardiomyopathy  IVCD  Implantable defibrillator-Medtronic  The patient's device was interrogated and the information was fully reviewed.  The device was reprogrammed to maximize longevity  The patient's electrocardiogram demonstrates an unusual pattern with a very small Q wave preceding the R wave in leads 1, L, and 6. Hence, this is by definition an IVCD and given his borderline length I would not pursue CRT upgrade at this time.  The patient is having recurrent nonsustained  ventricular tachycardia albeit less frequently than 3-4 weeks ago. His carvedilol was recently uptitrated from 6.25--12.5. In 2 weeks we will increase it further to 18.75. I'll have him come back and see AS at which time his interrogation demonstrates significant nonsustained VT we would further uptitrate his carvedilol >>25  Mg bid .

## 2015-03-02 NOTE — Patient Instructions (Signed)
Medication Instructions: 1) March 21, 2015- Increase coreg (carvedilol) to 12.5 mg take 1 & 1/2 tablets (18.75 mg) twice daily  Labwork: - no changes  Procedures/Testing: - none  Follow-Up: - Your physician recommends that you schedule a follow-up appointment with : [name], NP at the end of February.  Any Additional Special Instructions Will Be Listed Below (If Applicable).

## 2015-03-08 LAB — CUP PACEART INCLINIC DEVICE CHECK
Battery Remaining Longevity: 136 mo
Battery Voltage: 3.09 V
Brady Statistic RV Percent Paced: 0.01 %
Date Time Interrogation Session: 20161213221328
HighPow Impedance: 65 Ohm
Implantable Lead Implant Date: 20160829
Implantable Lead Location: 753860
Lead Channel Impedance Value: 399 Ohm
Lead Channel Impedance Value: 456 Ohm
Lead Channel Pacing Threshold Amplitude: 0.75 V
Lead Channel Pacing Threshold Pulse Width: 0.4 ms
Lead Channel Sensing Intrinsic Amplitude: 20 mV
Lead Channel Setting Pacing Amplitude: 2 V
Lead Channel Setting Pacing Pulse Width: 0.4 ms
Lead Channel Setting Sensing Sensitivity: 0.3 mV

## 2015-03-11 ENCOUNTER — Encounter: Payer: Self-pay | Admitting: Cardiology

## 2015-05-21 ENCOUNTER — Encounter: Payer: Self-pay | Admitting: Nurse Practitioner

## 2015-05-26 ENCOUNTER — Telehealth: Payer: Self-pay | Admitting: Cardiology

## 2015-05-26 DIAGNOSIS — I472 Ventricular tachycardia, unspecified: Secondary | ICD-10-CM

## 2015-05-26 MED ORDER — CARVEDILOL 25 MG PO TABS: 25.0000 mg | ORAL_TABLET | Freq: Two times a day (BID) | ORAL | Status: DC

## 2015-05-26 NOTE — Telephone Encounter (Signed)
Reviewed with device clinic and pt should call [phone] for instructions on how to send remote transmission.  I spoke with pt's wife and gave her these instructions.  They will send transmission.

## 2015-05-26 NOTE — Telephone Encounter (Addendum)
Remote transmission received.  Device function stable.  Patient has had 13 NSVT episodes monitored by device since 05/03/15, longest with EGM ~12 bts, avg V rates 222-240bpm.  Patient reports that he is taking all medications as prescribed, including carvedilol 18.75mg  BID.  Will review with Dr. Graciela Husbands and call patient back with additional recommendations.  He verbalizes understanding of information and denies additional questions or concerns at this time.

## 2015-05-26 NOTE — Addendum Note (Signed)
Addended by: Nigel Sloop on: 05/26/2015 05:41 PM   Modules accepted: Orders, Medications

## 2015-05-26 NOTE — Telephone Encounter (Signed)
Returned call to patient. Spoke with him/wife. Patient states his heart is racing and he can sometimes see it beating in his chest. Informed him that the defib he has does not control the HR but detects any abnormal rhythms that would be concerning and the purpose of the defib is to get him out of such rhythms. Advised that the device download he is scheduled for next week on 3/15 will provide [place] with the most info regarding his rhythm and if anything is going on, and he will be treated accordingly - he was originally scheduled for 3/3 but this appt had to be rescheduled d/t provider. Advised he monitor his BP and HR and that he record these when he is symptomatic as well. He did not have any other complaints. Wife states patient is very nervous. Advised that if he is very concerned regarding his symptoms, he can go to the hospital for eval. Patient/wife voiced understanding.

## 2015-05-26 NOTE — Telephone Encounter (Signed)
Would have him either come in today or tomorrow for device interrogation or have him send in a remote reading.  No need to make him wait until 3/15.

## 2015-05-26 NOTE — Telephone Encounter (Signed)
Pt heart is racing real fast and this makes him feel funny. Pt has a defibrillator. Please call asap to advise.

## 2015-05-26 NOTE — Telephone Encounter (Signed)
Discussed with Dr. Graciela Husbands, who advised that patient increase his carvedilol to 25mg  BID and keep scheduled follow-up with Dr. Shirlee Latch on 06/02/15.  Dr. Graciela Husbands also advised that patient should see him in 1-2 months to follow-up on NSVT burden.  Scheduler made aware via staff message.  Called and spoke with patient's wife.  She verbalizes understanding of medication dose change.  She is aware that patient can take two 12.5mg  tablets twice daily until he runs out, then fill the prescription for 25mg  tablets and take one tablet twice daily.  Advised that patient or wife should call if patient experiences dizziness, lightheadedness, or otherwise symptomatic low B/Ps as a result of the carvedilol dose increase.  Patient's wife verbalizes understanding and denies additional questions or concerns at this time.  She is appreciative of call.

## 2015-06-02 ENCOUNTER — Other Ambulatory Visit (HOSPITAL_COMMUNITY): Payer: Self-pay | Admitting: *Deleted

## 2015-06-02 ENCOUNTER — Ambulatory Visit (HOSPITAL_COMMUNITY)
Admission: RE | Admit: 2015-06-02 | Discharge: 2015-06-02 | Disposition: A | Discharge status: Home / Self Care | Payer: BLUE CROSS/BLUE SHIELD | Source: Ambulatory Visit | Attending: Internal Medicine | Admitting: Internal Medicine

## 2015-06-02 ENCOUNTER — Encounter (HOSPITAL_COMMUNITY): Payer: Self-pay

## 2015-06-02 VITALS — BP 130/80 | HR 66 | Wt 184.2 lb

## 2015-06-02 DIAGNOSIS — I472 Ventricular tachycardia: Secondary | ICD-10-CM

## 2015-06-02 DIAGNOSIS — I5022 Chronic systolic (congestive) heart failure: Secondary | ICD-10-CM

## 2015-06-02 DIAGNOSIS — M79675 Pain in left toe(s): Secondary | ICD-10-CM | POA: Diagnosis not present

## 2015-06-02 DIAGNOSIS — E875 Hyperkalemia: Secondary | ICD-10-CM | POA: Diagnosis not present

## 2015-06-02 DIAGNOSIS — Z8674 Personal history of sudden cardiac arrest: Secondary | ICD-10-CM | POA: Diagnosis not present

## 2015-06-02 DIAGNOSIS — Z8249 Family history of ischemic heart disease and other diseases of the circulatory system: Secondary | ICD-10-CM | POA: Insufficient documentation

## 2015-06-02 DIAGNOSIS — Z95810 Presence of automatic (implantable) cardiac defibrillator: Secondary | ICD-10-CM | POA: Diagnosis not present

## 2015-06-02 DIAGNOSIS — E785 Hyperlipidemia, unspecified: Secondary | ICD-10-CM | POA: Diagnosis not present

## 2015-06-02 DIAGNOSIS — I4729 Other ventricular tachycardia: Secondary | ICD-10-CM

## 2015-06-02 DIAGNOSIS — I429 Cardiomyopathy, unspecified: Secondary | ICD-10-CM

## 2015-06-02 DIAGNOSIS — I110 Hypertensive heart disease with heart failure: Secondary | ICD-10-CM | POA: Diagnosis not present

## 2015-06-02 DIAGNOSIS — Z7982 Long term (current) use of aspirin: Secondary | ICD-10-CM | POA: Insufficient documentation

## 2015-06-02 DIAGNOSIS — Z79899 Other long term (current) drug therapy: Secondary | ICD-10-CM | POA: Diagnosis not present

## 2015-06-02 DIAGNOSIS — R5383 Other fatigue: Secondary | ICD-10-CM | POA: Diagnosis not present

## 2015-06-02 DIAGNOSIS — I428 Other cardiomyopathies: Secondary | ICD-10-CM | POA: Diagnosis not present

## 2015-06-02 LAB — BASIC METABOLIC PANEL
Anion gap: 11 (ref 5–15)
BUN: 15 mg/dL (ref 6–20)
CO2: 27 mmol/L (ref 22–32)
Calcium: 9.7 mg/dL (ref 8.9–10.3)
Chloride: 103 mmol/L (ref 101–111)
Creatinine, Ser: 1.24 mg/dL (ref 0.61–1.24)
GFR calc Af Amer: >60 mL/min (ref >60)
GFR calc non Af Amer: >60 mL/min (ref >60)
Glucose, Bld: 97 mg/dL (ref 65–99)
Potassium: 4.6 mmol/L (ref 3.5–5.1)
Sodium: 141 mmol/L (ref 135–145)

## 2015-06-02 LAB — MAGNESIUM: Magnesium: 2 mg/dL (ref 1.7–2.4)

## 2015-06-02 LAB — URIC ACID: Uric Acid, Serum: 9.9 mg/dL — ABNORMAL HIGH (ref 4.4–7.6)

## 2015-06-02 MED ORDER — ALLOPURINOL 100 MG PO TABS: 100.0000 mg | ORAL_TABLET | Freq: Every day | ORAL | Status: DC

## 2015-06-02 MED ORDER — COLCHICINE 0.6 MG PO TABS: 0.6000 mg | ORAL_TABLET | Freq: Every day | ORAL | Status: DC

## 2015-06-02 MED ORDER — FUROSEMIDE 40 MG PO TABS: 40.0000 mg | ORAL_TABLET | ORAL | Status: DC

## 2015-06-02 NOTE — Progress Notes (Signed)
Advanced Heart Failure Medication Review by a Pharmacist  Does the patient  feel that his/her medications are working for him/her?  yes  Has the patient been experiencing any side effects to the medications prescribed?  no  Does the patient measure his/her own blood pressure or blood glucose at home?  yes   Does the patient have any problems obtaining medications due to transportation or finances?   no  Understanding of regimen: good Understanding of indications: good Potential of compliance: good Patient understands to avoid NSAIDs. Patient understands to avoid decongestants.  Issues to address at subsequent visits: None   Pharmacist comments: 55 YO pleasant male presents with his significant other and a medication list.  Pt and family member deny SE with current regimen. Pt states that Coreg was increased last week due to palpitations which have decreased since the dose increase. Pt denies HA, dizziness with his medications.  Pt reports adherence with the regimen as well.    Time with patient: 10 min  Preparation and documentation time: 5 min  Total time: 15 min

## 2015-06-02 NOTE — Progress Notes (Signed)
Patient ID: Brian Noble, male   DOB: [birth year], 55 y.o.   MRN: [id number] PCP: Dr. Marina Goodell Cardiology: Dr Shirlee Latch  55 yo with a prior history of cardiomyopathy who developed a cardiac arrest and was found to have low EF and normal coronaries.  Per patient, he was found to have low EF around 20% about 8 years ago.  He was seen by a cardiologist in [place] (he thinks), and EF went back to normal range.  He had no problems up until 8/16.  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  He was noted to pass out by family.  EMS was called and arrived quickly.  He was in either ventricular fibrillation or VT and was defibrillated.  He was cooled and sent for coronary angiography, which showed no significant coronary disease.  Echo showed EF 10% with severe RV dysfunction.  After recovery, he got a Medtronic ICD.  Repeat echo in 11/16 showed EF 20% with septal-lateral dyssynchrony, moderately decreased RV systolic function.  Today he returns for HF follow up. Complaining of fatigue and L great toe pain. Last week coreg was increased to 25 mg twice a day. Denies SOB/PND/Orthopnea. Weight at home 182-184 pounds.  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  Taking all medications. He is also active on his farm.   Labs (9/16): K 4.2, creatinine 1.04 Labs (01/04/2015) : K 4.6 Creatinine 1.09, SPEP negative Labs (11/16): K 5.5, creatinine 1.29 Labs (02/17/2015): K 5.0 creatinine 1.2   Optivol: Fluid index < threshold with stable impedance. Activity 8 hours daily   PMH: 1. LBBB 2. HTN: x years 3. Hyperlipidemia 4. Chronic systolic CHF: Patient was told around 2008 that his EF was about 20%.  He says that it recovered back to normal.  He thinks he may have seen a cardiologist in [place] at that time. On 11/10/14, he was admitted after having ventricular fibrillation versus VT arrest and being shocked by AED.   - LHC (8/16): Normal coronaries, EF < 25%. - Echo (8/16): EF  10%, diffuse hypokinesis, mild MR, severely decreased RV systolic function.  - Medtronic ICD (8/16).   - Echo (11/16): EF 20%, septal-lateral dyssynchrony, mildly dilated LV with mild LVH, moderately decreased RV systolic function.   FH: Sister with heart transplant, father with cardiac arrest when about 73, 2 other sisters with "heart problems."  He has 10 siblings in all.   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  ROS: All systems reviewed and negative except as per HPI.   Current Outpatient Prescriptions  Medication Sig Dispense Refill  . aspirin EC 81 MG tablet Take 81 mg by mouth daily.    . carvedilol (COREG) 25 MG tablet Take 1 tablet (25 mg total) by mouth 2 (two) times daily. 180 tablet 3  . furosemide (LASIX) 40 MG tablet Take 1 tablet (40 mg total) by mouth daily. 30 tablet 0  . lovastatin (MEVACOR) 20 MG tablet Take 20 mg by mouth daily.    . magnesium oxide (MAG-OX) 400 MG tablet Take 1 tablet (400 mg total) by mouth 2 (two) times daily. 60 tablet 0  . sacubitril-valsartan (ENTRESTO) 24-26 MG Take 1 tablet by mouth 2 (two) times daily. 60 tablet 6  . spironolactone (ALDACTONE) 25 MG tablet Take 1 tablet (25 mg total) by mouth daily. 30 tablet 3   No current facility-administered medications for this encounter.  BP 130/80 mmHg  Pulse 66  Wt 184 lb 3.2 oz (83.553 kg)  SpO2 98% General: NAD. Ambulated in the without difficulty.  Neck: No JVD, no thyromegaly or thyroid nodule.  Lungs: Clear to auscultation bilaterally with normal respiratory effort. CV: Nondisplaced PMI.  Heart regular S1/S2, no S3/S4, 1/6 SEM RUSB.  No peripheral edema.  No carotid bruit.  Normal pedal pulses.  Abdomen: Soft, nontender, no hepatosplenomegaly, no distention.  Skin: Intact without lesions or rashes.  Neurologic: Alert and oriented x 3.  Psych: Normal affect. Extremities: No clubbing or cyanosis.  HEENT: Normal.   Assessment/Plan: 1. Chronic  systolic CHF: Nonischemic cardiomyopathy.  Medtronic ICD.  Possibly familial CMP given father with SCD at 42, sister with heart transplant, and 2 other sisters with "heart problems."  Cannot rule out prior viral myocarditis or role for HTN. Last echo 2016 with EF 20%, septal-lateral dyssynchrony.    NYHA class II symptoms currently, stable volume status stable and verified with Optivol . Cut back lasix to every other day.  - Continue Coreg to 25 mg twice a day.  - Continue Entresto 24/26 bid.  Most recent K 5.0 will not increase today. Check BMET first.  - Consider Bidi next visit.  - Continue spironolactone.    - - Given possible familial CMP, recommended that Mr Mazor siblings all be screened by echo.  He has 10 siblings. - Set up CPX due to increased fatigue. Likely due to increased bb.  - BMET/magnesium level today.  2. Cardiac arrest: Now has Medtronic ICD. Has follow up with Dr Graciela Husbands.  3. Day time Fatigue; Offered sleep study however he wants to hold off.  4. L Great Toe Pain- Suspect gout. Check uric acid.  5. NSVT: Check BMET and Mag today. Continue carvedilol 25 mg twice a day. 6. Hyperkalemia- Check BMET today.    Set up CPX test. Follow up 4 weeks   Isola Mehlman NP-C  06/02/2015

## 2015-06-02 NOTE — Patient Instructions (Addendum)
Labs today CHANGE Lasix to every other day  Your physician has recommended that you have a cardiopulmonary stress test (CPX). CPX testing is a non-invasive measurement of heart and lung function. It replaces a traditional treadmill stress test. This type of test provides a tremendous amount of information that relates not only to your present condition but also for future outcomes. This test combines measurements of you ventilation, respiratory gas exchange in the lungs, electrocardiogram (EKG), blood pressure and physical response before, during, and following an exercise protocol.  Your physician recommends that you schedule a follow-up appointment in: 4 weeks with Dr.McLean  Do the following things EVERYDAY: 1) Weigh yourself in the morning before breakfast. Write it down and keep it in a log. 2) Take your medicines as prescribed 3) Eat low salt foods-Limit salt (sodium) to 2000 mg per day.  4) Stay as active as you can everyday 5) Limit all fluids for the day to less than 2 liters.

## 2015-06-04 ENCOUNTER — Telehealth (HOSPITAL_COMMUNITY): Payer: Self-pay | Admitting: Cardiology

## 2015-06-04 DIAGNOSIS — I472 Ventricular tachycardia, unspecified: Secondary | ICD-10-CM

## 2015-06-04 MED ORDER — CARVEDILOL 25 MG PO TABS: 25.0000 mg | ORAL_TABLET | Freq: Two times a day (BID) | ORAL | Status: DC

## 2015-06-04 NOTE — Telephone Encounter (Signed)
Patient called to request a refill.  

## 2015-06-10 ENCOUNTER — Ambulatory Visit (HOSPITAL_COMMUNITY): Payer: BLUE CROSS/BLUE SHIELD | Attending: Cardiology

## 2015-06-10 DIAGNOSIS — I5022 Chronic systolic (congestive) heart failure: Secondary | ICD-10-CM | POA: Diagnosis not present

## 2015-06-11 ENCOUNTER — Other Ambulatory Visit (HOSPITAL_COMMUNITY): Payer: Self-pay | Admitting: *Deleted

## 2015-06-11 DIAGNOSIS — I5022 Chronic systolic (congestive) heart failure: Secondary | ICD-10-CM | POA: Diagnosis not present

## 2015-06-16 ENCOUNTER — Telehealth (HOSPITAL_COMMUNITY): Payer: Self-pay

## 2015-06-16 NOTE — Telephone Encounter (Signed)
Wife called to verify if patient is safe to exercise. Advised that walking is okay as long as patient is not profusely sweating, has markedly increased WOB and/or HR, and does not feel dizzy.  Ave Filter

## 2015-06-24 ENCOUNTER — Ambulatory Visit (HOSPITAL_COMMUNITY)
Admission: RE | Admit: 2015-06-24 | Discharge: 2015-06-24 | Disposition: A | Discharge status: Home / Self Care | Payer: BLUE CROSS/BLUE SHIELD | Source: Ambulatory Visit | Attending: Cardiology | Admitting: Cardiology

## 2015-06-24 ENCOUNTER — Encounter (HOSPITAL_COMMUNITY): Payer: Self-pay

## 2015-06-24 ENCOUNTER — Other Ambulatory Visit (HOSPITAL_COMMUNITY): Payer: Self-pay | Admitting: *Deleted

## 2015-06-24 ENCOUNTER — Encounter: Payer: Self-pay | Admitting: Internal Medicine

## 2015-06-24 VITALS — BP 122/74 | HR 62 | Wt 186.5 lb

## 2015-06-24 DIAGNOSIS — Z95810 Presence of automatic (implantable) cardiac defibrillator: Secondary | ICD-10-CM | POA: Diagnosis not present

## 2015-06-24 DIAGNOSIS — Z8674 Personal history of sudden cardiac arrest: Secondary | ICD-10-CM | POA: Diagnosis not present

## 2015-06-24 DIAGNOSIS — R5383 Other fatigue: Secondary | ICD-10-CM | POA: Insufficient documentation

## 2015-06-24 DIAGNOSIS — I428 Other cardiomyopathies: Secondary | ICD-10-CM | POA: Diagnosis not present

## 2015-06-24 DIAGNOSIS — I5022 Chronic systolic (congestive) heart failure: Secondary | ICD-10-CM | POA: Diagnosis present

## 2015-06-24 DIAGNOSIS — I4891 Unspecified atrial fibrillation: Secondary | ICD-10-CM

## 2015-06-24 DIAGNOSIS — I110 Hypertensive heart disease with heart failure: Secondary | ICD-10-CM | POA: Diagnosis not present

## 2015-06-24 DIAGNOSIS — I472 Ventricular tachycardia, unspecified: Secondary | ICD-10-CM

## 2015-06-24 DIAGNOSIS — Z7982 Long term (current) use of aspirin: Secondary | ICD-10-CM | POA: Insufficient documentation

## 2015-06-24 DIAGNOSIS — E785 Hyperlipidemia, unspecified: Secondary | ICD-10-CM | POA: Diagnosis not present

## 2015-06-24 DIAGNOSIS — Z79899 Other long term (current) drug therapy: Secondary | ICD-10-CM | POA: Diagnosis not present

## 2015-06-24 DIAGNOSIS — Z8249 Family history of ischemic heart disease and other diseases of the circulatory system: Secondary | ICD-10-CM | POA: Diagnosis not present

## 2015-06-24 DIAGNOSIS — M79675 Pain in left toe(s): Secondary | ICD-10-CM | POA: Insufficient documentation

## 2015-06-24 LAB — BASIC METABOLIC PANEL
Anion gap: 11 (ref 5–15)
BUN: 15 mg/dL (ref 6–20)
CO2: 22 mmol/L (ref 22–32)
Calcium: 9.4 mg/dL (ref 8.9–10.3)
Chloride: 106 mmol/L (ref 101–111)
Creatinine, Ser: 1.08 mg/dL (ref 0.61–1.24)
GFR calc Af Amer: >60 mL/min (ref >60)
GFR calc non Af Amer: >60 mL/min (ref >60)
Glucose, Bld: 97 mg/dL (ref 65–99)
Potassium: 4.4 mmol/L (ref 3.5–5.1)
Sodium: 139 mmol/L (ref 135–145)

## 2015-06-24 LAB — CBC
HCT: 42.3 % (ref 39.0–52.0)
Hemoglobin: 13.4 g/dL (ref 13.0–17.0)
MCH: 28.4 pg (ref 26.0–34.0)
MCHC: 31.7 g/dL (ref 30.0–36.0)
MCV: 89.6 fL (ref 78.0–100.0)
Platelets: 210 10*3/uL (ref 150–400)
RBC: 4.72 MIL/uL (ref 4.22–5.81)
RDW: 12.8 % (ref 11.5–15.5)
WBC: 6.9 10*3/uL (ref 4.0–10.5)

## 2015-06-24 LAB — BRAIN NATRIURETIC PEPTIDE: B Natriuretic Peptide: 197.5 pg/mL — ABNORMAL HIGH (ref 0.0–100.0)

## 2015-06-24 LAB — TSH: TSH: 1.028 u[IU]/mL (ref 0.350–4.500)

## 2015-06-24 MED ORDER — ISOSORB DINITRATE-HYDRALAZINE 20-37.5 MG PO TABS: 1.0000 | ORAL_TABLET | Freq: Three times a day (TID) | ORAL | Status: DC

## 2015-06-24 MED ORDER — CARVEDILOL 25 MG PO TABS: 12.5000 mg | ORAL_TABLET | Freq: Two times a day (BID) | ORAL | Status: DC

## 2015-06-24 NOTE — Patient Instructions (Addendum)
Decrease Carvedilol to 12.5 mg Twice daily   Start Bidil 1 tab Three times a day   Labs today  Your physician has recommended that you wear an event monitor. Event monitors are medical devices that record the heart's electrical activity. Doctors most often [place] these monitors to diagnose arrhythmias. Arrhythmias are problems with the speed or rhythm of the heartbeat. The monitor is a small, portable device. You can wear one while you do your normal daily activities. This is usually used to diagnose what is causing palpitations/syncope (passing out).  Your physician recommends that you schedule a follow-up appointment in: 6 weeks

## 2015-06-24 NOTE — Progress Notes (Signed)
Patient ID: Brian Noble, male   DOB: [birth year], 55 y.o.   MRN: [id number]    PCP: Dr. Marina Goodell Cardiology: Dr Shirlee Latch  55 yo with a prior history of cardiomyopathy who developed a cardiac arrest and was found to have low EF and normal coronaries.  Per patient, he was found to have low EF around 20% about 8 years ago.  He was seen by a cardiologist in [place] (he thinks), and EF went back to normal range.  He had no problems up until 8/16.  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  He was noted to pass out by family.  EMS was called and arrived quickly.  He was in either ventricular fibrillation or VT and was defibrillated.  He was cooled and sent for coronary angiography, which showed no significant coronary disease.  Echo showed EF 10% with severe RV dysfunction.  After recovery, he got a Medtronic ICD.  Repeat echo in 11/16 showed EF 20% with septal-lateral dyssynchrony, moderately decreased RV systolic function.    Today he returns for HF follow up. Last visit lasix was cut back to every other day. Weight at home 186 pounds.  Complaining of extreme fatigue since Coreg increased from 12.5 bid up to 25 bid.  Denies SOB/PND/Orthopnea. Weight at home 184-186 pounds.  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  Taking all medications. 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    Labs (9/16): K 4.2, creatinine 1.04 Labs (01/04/2015) : K 4.6 Creatinine 1.09, SPEP negative Labs (11/16): K 5.5, creatinine 1.29 Labs (02/17/2015): K 5.0 creatinine 1.2  Labs (06/02/2015): K 4.6 Creatinine 1.24   Optivol: Fluid index < threshold, impedance stable, ?atrial fibrillation episodes.  Had formal evaluation of defibrillator by [employer], not sure if he had atrial fibrillation or if device is detecting frequent PVCs.   PMH: 1. LBBB 2. HTN: x years 3. Hyperlipidemia 4. Chronic systolic CHF: Patient was told around 2008 that his EF was about 20%.  He says that it  recovered back to normal.  He thinks he may have seen a cardiologist in [place] at that time. On 11/10/14, he was admitted after having ventricular fibrillation versus VT arrest and being shocked by AED.   - LHC (8/16): Normal coronaries, EF < 25%. - Echo (8/16): EF 10%, diffuse hypokinesis, mild MR, severely decreased RV systolic function.  - Medtronic ICD (8/16).   - Echo (11/16): EF 20%, septal-lateral dyssynchrony, mildly dilated LV with mild LVH, moderately decreased RV systolic function.  - CPX (3/17): Peak VO2 23.1 (69% predicted peak VO2) VE/VCO2 slope: 32 OUES: 2.00 Peak RER: 1.11 Ventilatory Threshold: 17.7 (53% predicted or measured peak VO2) => Mild functional impairment.   FH: Sister with heart transplant, father with cardiac arrest when about 55, 2 other sisters with "heart problems."  He has 10 siblings in all.   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  ROS: All systems reviewed and negative except as per HPI.   Current Outpatient Prescriptions  Medication Sig Dispense Refill  . allopurinol (ZYLOPRIM) 100 MG tablet Take 1 tablet (100 mg total) by mouth daily. Start medication on 06/07/2015 30 tablet 3  . aspirin EC 81 MG tablet Take 81 mg by mouth daily.    . carvedilol (COREG) 25 MG tablet Take 1 tablet (25 mg total) by mouth 2 (two) times daily. 180 tablet 3  .  colchicine 0.6 MG tablet Take 1 tablet (0.6 mg total) by mouth daily. Take 2 tablets by mouth once. One hour later take 1 additional tablet. 15 tablet 3  . furosemide (LASIX) 40 MG tablet Take 1 tablet (40 mg total) by mouth every other day. 30 tablet 0  . lovastatin (MEVACOR) 20 MG tablet Take 20 mg by mouth daily.    . magnesium oxide (MAG-OX) 400 MG tablet Take 1 tablet (400 mg total) by mouth 2 (two) times daily. 60 tablet 0  . sacubitril-valsartan (ENTRESTO) 24-26 MG Take 1 tablet by mouth 2 (two) times daily. 60 tablet 6  . spironolactone (ALDACTONE) 25 MG tablet Take 1  tablet (25 mg total) by mouth daily. 30 tablet 3   No current facility-administered medications for this encounter.   BP 122/74 mmHg  Pulse 62  Wt 186 lb 8 oz (84.596 kg)  SpO2 98% General: NAD. Ambulated in the without difficulty.  Neck: No JVD, no thyromegaly or thyroid nodule.  Lungs: Clear to auscultation bilaterally with normal respiratory effort. CV: Nondisplaced PMI.  Heart regular S1/S2, no S3/S4, 1/6 SEM RUSB.  No peripheral edema.  No carotid bruit.  Normal pedal pulses.  Abdomen: Soft, nontender, no hepatosplenomegaly, no distention.  Skin: Intact without lesions or rashes.  Neurologic: Alert and oriented x 3.  Psych: Normal affect. Extremities: No clubbing or cyanosis.  HEENT: Normal.   Assessment/Plan: 1. Chronic systolic CHF: Nonischemic cardiomyopathy.  Medtronic ICD.  Possibly familial CMP given father with SCD at 42, sister with heart transplant, and 2 other sisters with "heart problems."  Cannot rule out prior viral myocarditis or role for HTN. Last echo 2016 with EF 20%, septal-lateral dyssynchrony.  CPX in 3/17 with mild functional impairment.  NYHA class II symptoms currently, volume status stable and verified with Optivol.  Main complaint is profound fatigue since increasing Coreg.  - Continue Lasix every other day.   - Cut Coreg back to 12.5 mg twice a day due to extreme fatigue.   - Continue Entresto 24/26 bid.   - Add bidil 1 tab three times a day.  - Continue spironolactone.  - Check BMET, BNP  - Given possible familial CMP, recommended that Mr Pittard siblings all be screened by echo.  He has 10 siblings. 2. Cardiac arrest: Now has Medtronic ICD. Has follow up with Dr Graciela Husbands.  3. Fatigue; Offered sleep study however he wants to hold off.  4. L Great Toe Pain: Suspect gout. Check uric acid.  5. NSVT: Check BMET and Mag today. Cutting back on Coreg with marked fatigue, will need to watch for increased ectopy. 6. ?Atrial fibrillation: Optivol interrogation  suggests that Mr Amacker has had atrial fibrillation episodes.  However, formal interrogation of his single chamber device suggests that it may be picking up episodes of frequent PVCs.   - I will have him wear a 30 day monitor to define arrhythmia.  If he has atrial fibrillation, will need anticoagulation.    Follow up 4 weeks   Amy Clegg NP-C  06/24/2015   Patient seen with NP, agree with the above note.  Plan to cut back on Coreg to 12.5 mg bid with profound fatigue.  Add Bidil 1 tab tid.  ?Atrial fibrillation versus frequent PVCs picked up by Optivol monitoring. Will place 30 day monitor.  Followup after this is complete.   Marca Ancona 06/26/2015

## 2015-06-24 NOTE — Progress Notes (Signed)
error 

## 2015-07-01 ENCOUNTER — Ambulatory Visit (INDEPENDENT_AMBULATORY_CARE_PROVIDER_SITE_OTHER): Payer: BLUE CROSS/BLUE SHIELD

## 2015-07-01 DIAGNOSIS — I4891 Unspecified atrial fibrillation: Secondary | ICD-10-CM | POA: Diagnosis not present

## 2015-07-05 ENCOUNTER — Telehealth (HOSPITAL_COMMUNITY): Payer: Self-pay | Admitting: Pharmacist

## 2015-07-05 NOTE — Telephone Encounter (Signed)
Brian Noble left VM describing pain in lower legs and severe headache with use of new Bidil. He discontinued use after 3 days. I spoke with his wife today and recommended using 1/2 tab TID for 1-2 weeks and then attempting to titrate up to 1 tab TID along with use of APAP to relieve headaches. She verbalized understanding and they are willing to try this.   [name]. Bonnye Fava, PharmD, BCPS, CPP Clinical Pharmacist Pager: [phone] Phone: [phone] 07/05/2015 9:42 AM

## 2015-07-13 ENCOUNTER — Encounter: Payer: Self-pay | Admitting: Internal Medicine

## 2015-07-31 ENCOUNTER — Encounter (HOSPITAL_COMMUNITY): Payer: Self-pay | Admitting: Emergency Medicine

## 2015-07-31 ENCOUNTER — Emergency Department (HOSPITAL_COMMUNITY)
Admission: EM | Admit: 2015-07-31 | Discharge: 2015-07-31 | Disposition: A | Discharge status: Home / Self Care | Payer: BLUE CROSS/BLUE SHIELD | Attending: Emergency Medicine | Admitting: Emergency Medicine

## 2015-07-31 ENCOUNTER — Emergency Department (HOSPITAL_COMMUNITY): Payer: BLUE CROSS/BLUE SHIELD

## 2015-07-31 DIAGNOSIS — N189 Chronic kidney disease, unspecified: Secondary | ICD-10-CM | POA: Insufficient documentation

## 2015-07-31 DIAGNOSIS — I129 Hypertensive chronic kidney disease with stage 1 through stage 4 chronic kidney disease, or unspecified chronic kidney disease: Secondary | ICD-10-CM | POA: Diagnosis not present

## 2015-07-31 DIAGNOSIS — I5022 Chronic systolic (congestive) heart failure: Secondary | ICD-10-CM | POA: Diagnosis not present

## 2015-07-31 DIAGNOSIS — R0602 Shortness of breath: Secondary | ICD-10-CM | POA: Diagnosis not present

## 2015-07-31 DIAGNOSIS — Z79899 Other long term (current) drug therapy: Secondary | ICD-10-CM | POA: Insufficient documentation

## 2015-07-31 DIAGNOSIS — M1990 Unspecified osteoarthritis, unspecified site: Secondary | ICD-10-CM | POA: Diagnosis not present

## 2015-07-31 DIAGNOSIS — R079 Chest pain, unspecified: Secondary | ICD-10-CM | POA: Diagnosis present

## 2015-07-31 DIAGNOSIS — Z7982 Long term (current) use of aspirin: Secondary | ICD-10-CM | POA: Insufficient documentation

## 2015-07-31 DIAGNOSIS — Z9889 Other specified postprocedural states: Secondary | ICD-10-CM | POA: Diagnosis not present

## 2015-07-31 LAB — BASIC METABOLIC PANEL
Anion gap: 12 (ref 5–15)
BUN: 19 mg/dL (ref 6–20)
CO2: 22 mmol/L (ref 22–32)
Calcium: 9.4 mg/dL (ref 8.9–10.3)
Chloride: 103 mmol/L (ref 101–111)
Creatinine, Ser: 1.23 mg/dL (ref 0.61–1.24)
GFR calc Af Amer: >60 mL/min (ref >60)
GFR calc non Af Amer: >60 mL/min (ref >60)
Glucose, Bld: 97 mg/dL (ref 65–99)
Potassium: 4 mmol/L (ref 3.5–5.1)
Sodium: 137 mmol/L (ref 135–145)

## 2015-07-31 LAB — CBC
HCT: 45.1 % (ref 39.0–52.0)
Hemoglobin: 14.5 g/dL (ref 13.0–17.0)
MCH: 28.7 pg (ref 26.0–34.0)
MCHC: 32.2 g/dL (ref 30.0–36.0)
MCV: 89.3 fL (ref 78.0–100.0)
Platelets: 217 10*3/uL (ref 150–400)
RBC: 5.05 MIL/uL (ref 4.22–5.81)
RDW: 12.8 % (ref 11.5–15.5)
WBC: 6.6 10*3/uL (ref 4.0–10.5)

## 2015-07-31 LAB — I-STAT TROPONIN, ED
Troponin i, poc: 0 ng/mL (ref 0.00–0.08)
Troponin i, poc: 0 ng/mL (ref 0.00–0.08)

## 2015-07-31 LAB — BRAIN NATRIURETIC PEPTIDE: B Natriuretic Peptide: 186.8 pg/mL — ABNORMAL HIGH (ref 0.0–100.0)

## 2015-07-31 NOTE — ED Notes (Signed)
Pt here for "chest pains for a while." Pt has medtronix defib implanted, no issues. Pt also reports SOB with hx of HF and EF of 20%. No swelling reported.

## 2015-07-31 NOTE — ED Provider Notes (Signed)
CSN: [id number]     Arrival date & time 07/31/15  1628 History   First MD Initiated Contact with Patient 07/31/15 1640     Chief Complaint  Patient presents with  . Chest Pain     (Consider location/radiation/quality/duration/timing/severity/associated sxs/prior Treatment) The history is provided by the patient and medical records.    55 y.o. M with hx of HTN, CKD, NICM, CHF, hx of VFIB with medtronic ICD in place, presenting to the ED for chest pain.Patient reports around 2:30 PM today he returned home from visiting his mother and his son. He states when he got the car and was walking back to the house he developed some shortness of breath and chest pain. He states he has chronic shortness of breath, usually associated with chest pain, so this was not necessarily out of the ordinary for him.  He denies any dizziness, diaphoresis, nausea, vomiting, or feelings of syncope during this event. He went to the house and sat on the couch and symptoms resolved. Patient does have known CHF with most recent 2D echo with estimated 20% EF.  She takes Lasix every other day due to worsening gout when on this medication. He denies any significant weight gain, leg swelling, or abdominal swelling. Patient does have defibrillator in place, denies any recent events or shocks. Patient is followed by cardiology, Dr. Sherlie Ban and Dr. Graciela Husbands. On arrival to ED, patient reports he is asymptomatic. His wife does report he has been stressed recently because of his job. 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 He states lately he feels as though "his body can not keep up anymore". He states he often gets very fatigued, tired, and short of breath at work with heavy exertion.  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  He states he likes to stay busy but thinks he may be "overdoing it".  Patient did have prior cardiac arrest in August 2016.  He  reports "i haven't been the same since".  VSS on arrival.  Past Medical History  Diagnosis Date  . Hypertension   . Chronic kidney disease   . Arthritis   . NICM (nonischemic cardiomyopathy) (HCC)     a. LHC 8/16:  normal coronary arteries.   . Chronic systolic CHF (congestive heart failure) (HCC)     a. Echo 8/16:  EF 10%, diff HK, Gr 1 DD, mild dilated aortic root, mild MR, mild LAE, severely reduced RVSF  . VF (ventricular fibrillation) (HCC)     s/p OOH cardiac arrest 8/16 >> s/p ICD  . ICD (implantable cardioverter-defibrillator) in place    Past Surgical History  Procedure Laterality Date  . Cardiac catheterization N/A 11/10/2014    Procedure: Left Heart Cath and Coronary Angiography;  Surgeon: [name], MD;  Location: [facility] INVASIVE CV LAB;  Service: Cardiovascular;  Laterality: N/A;  . Appendectomy    . Ep implantable device N/A 11/16/2014    Procedure: ICD Implant;  Surgeon: [name], MD;  Location: [facility] INVASIVE CV LAB;  Service: Cardiovascular;  Laterality: N/A;   Family History  Problem Relation Age of Onset  . Hypertension Mother   . Hypertension Father   . Sudden death Father   . Heart disease Sister   . Heart disease Sister   . Hypertension Sister   . Hypertension Sister   . Hypertension Brother   . Hypertension Brother   . Hypertension  Brother   . Hypertension Brother   . Hypertension Sister    Social History  Substance Use Topics  . Smoking status: Never Smoker   . Smokeless tobacco: None  . Alcohol Use: No    Review of Systems  Respiratory: Positive for shortness of breath.   Cardiovascular: Positive for chest pain.  All other systems reviewed and are negative.     Allergies  Review of patient's allergies indicates no known allergies.  Home Medications   Prior to Admission medications   Medication Sig Start Date End Date Taking? Authorizing Provider  allopurinol (ZYLOPRIM) 100 MG tablet Take 1 tablet (100 mg total) by mouth daily.  Start medication on 06/07/2015 06/02/15   Amy D Filbert Schilder, NP  aspirin EC 81 MG tablet Take 81 mg by mouth daily.    Historical Provider, MD  carvedilol (COREG) 25 MG tablet Take 0.5 tablets (12.5 mg total) by mouth 2 (two) times daily. 06/24/15   Laurey Morale, MD  colchicine 0.6 MG tablet Take 1 tablet (0.6 mg total) by mouth daily. Take 2 tablets by mouth once. One hour later take 1 additional tablet. 06/02/15   Amy D Filbert Schilder, NP  furosemide (LASIX) 40 MG tablet Take 1 tablet (40 mg total) by mouth every other day. 06/02/15   Amy D Clegg, NP  isosorbide-hydrALAZINE (BIDIL) 20-37.5 MG tablet Take 1 tablet by mouth 3 (three) times daily. 06/24/15   Laurey Morale, MD  lovastatin (MEVACOR) 20 MG tablet Take 20 mg by mouth daily.    Historical Provider, MD  magnesium oxide (MAG-OX) 400 MG tablet Take 1 tablet (400 mg total) by mouth 2 (two) times daily. 11/17/14   Renae Fickle, MD  sacubitril-valsartan (ENTRESTO) 24-26 MG Take 1 tablet by mouth 2 (two) times daily. 12/24/14   Laurey Morale, MD  spironolactone (ALDACTONE) 25 MG tablet Take 1 tablet (25 mg total) by mouth daily. 01/18/15   Laurey Morale, MD   BP 130/79 mmHg  Pulse 72  Temp(Src) 98.5 F (36.9 C) (Oral)  Resp 17  Ht 5\' 8"  (1.727 m)  Wt 83.779 kg  BMI 28.09 kg/m2  SpO2 99%   Physical Exam  Constitutional: He is oriented to person, place, and time. He appears well-developed and well-nourished.  No distress, laughing, joking, very pleasant  HENT:  Head: Normocephalic and atraumatic.  Mouth/Throat: Oropharynx is clear and moist.  Eyes: Conjunctivae and EOM are normal. Pupils are equal, round, and reactive to light.  Neck: Normal range of motion.  Cardiovascular: Normal rate, regular rhythm and normal heart sounds.   Pulmonary/Chest: Effort normal and breath sounds normal.  Abdominal: Soft. Bowel sounds are normal.  Musculoskeletal: Normal range of motion.  No pitting edema, no calf asymmetry, tenderness, or palpable cords, no  overlying skin changes  Neurological: He is alert and oriented to person, place, and time.  Skin: Skin is warm and dry.  Psychiatric: He has a normal mood and affect.  Nursing note and vitals reviewed.   ED Course  Procedures (including critical care time) Labs Review Labs Reviewed  BRAIN NATRIURETIC PEPTIDE - Abnormal; Notable for the following:    B Natriuretic Peptide 186.8 (*)    All other components within normal limits  BASIC METABOLIC PANEL  CBC  I-STAT TROPOININ, ED  Rosezena Sensor, ED    Imaging Review Dg Chest 2 View  07/31/2015  CLINICAL DATA:  Shortness of breath and midsternal chest pain EXAM: CHEST  2 VIEW COMPARISON:  None. FINDINGS: Heart  size is normal but there is left ventricular prominence. Single lead AICD inserted from a left subclavian approach has its tip in the region of the right ventricle. Mediastinal shadows are otherwise normal. The pulmonary vascularity is normal. The lungs are clear. No effusions. No bony abnormalities. IMPRESSION: Left ventricular prominence. AICD. No active disease by radiography. Electronically Signed   By: Paulina Fusi M.D.   On: 07/31/2015 17:24   I have personally reviewed and evaluated these images and lab results as part of my medical decision-making.   EKG Interpretation   Date/Time:  Saturday Jul 31 2015 16:35:39 EDT Ventricular Rate:  69 PR Interval:  198 QRS Duration: 140 QT Interval:  390 QTC Calculation: 417 R Axis:   -59 Text Interpretation:  Normal sinus rhythm Left axis deviation No  significant change since last tracing Confirmed by FLOYD MD, Reuel Boom  ([id number]) on 07/31/2015 5:40:38 PM      MDM   Final diagnoses:  Chest pain, unspecified chest pain type   55 year old male here with exertional chest pain shortness of breath over the past several weeks. Has known severe congestive heart failure with estimated EF of 20%. He has a defibrillator in place, no recent events or shocks delivered. Patient is  asymptomatic on arrival to ED. His vital signs are stable. His workup today is overall reassuring. His BNP is mildly elevated which seems chronic for him. His chest x-ray does not reveal any pulmonary vascular congestion or pulmonary edema. Patient was observed and delta troponin obtained which is also negative. He has remained pain-free while in the emergency department. 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 I discussed this with patient, he and wife both agree that he should likely cut back on his activities. Patient has previously scheduled follow-up with his cardiologist next week.  Discussed plan with patient, he/she acknowledged understanding and agreed with plan of care.  Return precautions given for new or worsening symptoms.  Case discussed with attending physician, Dr. Adela Lank, who agrees with assessment and plan of care.  Garlon Hatchet, PA-C 07/31/15 2118  Melene Plan, DO 07/31/15 2248

## 2015-07-31 NOTE — Discharge Instructions (Signed)
Continue your regular home medications. Follow-up with Dr. Shirlee Latch. Return here for any recurrent chest pain, shortness of breath, dizziness, weakness, etc.

## 2015-08-05 ENCOUNTER — Encounter: Payer: Self-pay | Admitting: Internal Medicine

## 2015-08-05 ENCOUNTER — Ambulatory Visit (INDEPENDENT_AMBULATORY_CARE_PROVIDER_SITE_OTHER): Payer: BLUE CROSS/BLUE SHIELD | Admitting: Internal Medicine

## 2015-08-05 VITALS — BP 140/80 | HR 68 | Ht 68.0 in | Wt 186.0 lb

## 2015-08-05 DIAGNOSIS — I472 Ventricular tachycardia, unspecified: Secondary | ICD-10-CM

## 2015-08-05 DIAGNOSIS — I429 Cardiomyopathy, unspecified: Secondary | ICD-10-CM

## 2015-08-05 DIAGNOSIS — I469 Cardiac arrest, cause unspecified: Secondary | ICD-10-CM | POA: Diagnosis not present

## 2015-08-05 DIAGNOSIS — Z95810 Presence of automatic (implantable) cardiac defibrillator: Secondary | ICD-10-CM | POA: Diagnosis not present

## 2015-08-05 DIAGNOSIS — I428 Other cardiomyopathies: Secondary | ICD-10-CM

## 2015-08-05 NOTE — Progress Notes (Signed)
Patient Care Team: Brian Miyamoto, MD as PCP - General (Family Medicine)   HPI  Brian Noble is a 55 y.o. male Seen in follow-up for aborted cardiac arrest in the context of nonischemic cardiomyopathy and hypokalemia. This was 8/16; he received an ICD.  When he saw Dr. DM last 11/16 the issue was raised as to whether he would benefit from CRT given his "left bundle branch block". Echo demonstrated "septal dyssynchrony"  EF at that time 20%   The patient denies shortness of breath, nocturnal dyspnea, orthopnea or peripheral edema.     He has had some palpitations. Event recorder has been prescribed but not yet read. He had some chest pain and ended up in the emergency room earlier this week. These records were reviewed.    Device History: ICD implanted8/16 for VT/VF History of appropriate therapy: No History of AAD therapy: No    Records and Results Reviewed hosp records  Past Medical History  Diagnosis Date  . Hypertension   . Chronic kidney disease   . Arthritis   . NICM (nonischemic cardiomyopathy) (HCC)     a. LHC 8/16:  normal coronary arteries.   . Chronic systolic CHF (congestive heart failure) (HCC)     a. Echo 8/16:  EF 10%, diff HK, Gr 1 DD, mild dilated aortic root, mild MR, mild LAE, severely reduced RVSF  . VF (ventricular fibrillation) (HCC)     s/p OOH cardiac arrest 8/16 >> s/p ICD  . ICD (implantable cardioverter-defibrillator) in place     Past Surgical History  Procedure Laterality Date  . Cardiac catheterization N/A 11/10/2014    Procedure: Left Heart Cath and Coronary Angiography;  Surgeon: [name], MD;  Location: [facility] INVASIVE CV LAB;  Service: Cardiovascular;  Laterality: N/A;  . Appendectomy    . Ep implantable device N/A 11/16/2014    Procedure: ICD Implant;  Surgeon: [name], MD;  Location: [facility] INVASIVE CV LAB;  Service: Cardiovascular;  Laterality: N/A;    Current Outpatient Prescriptions  Medication Sig  Dispense Refill  . aspirin EC 81 MG tablet Take 81 mg by mouth daily.    . carvedilol (COREG) 25 MG tablet Take 0.5 tablets (12.5 mg total) by mouth 2 (two) times daily. 180 tablet 3  . colchicine 0.6 MG tablet Take 0.6 mg by mouth daily as needed (for flare ups).    . furosemide (LASIX) 40 MG tablet Take 1 tablet (40 mg total) by mouth every other day. 30 tablet 0  . lovastatin (MEVACOR) 20 MG tablet Take 20 mg by mouth every morning.     . magnesium oxide (MAG-OX) 400 MG tablet Take 1 tablet (400 mg total) by mouth 2 (two) times daily. 60 tablet 0  . sacubitril-valsartan (ENTRESTO) 24-26 MG Take 1 tablet by mouth 2 (two) times daily. 60 tablet 6  . spironolactone (ALDACTONE) 25 MG tablet Take 1 tablet (25 mg total) by mouth daily. 30 tablet 3  . allopurinol (ZYLOPRIM) 100 MG tablet Take 1 tablet (100 mg total) by mouth daily. Start medication on 06/07/2015 (Patient not taking: Reported on 07/31/2015) 30 tablet 3  . isosorbide-hydrALAZINE (BIDIL) 20-37.5 MG tablet Take 1 tablet by mouth 3 (three) times daily. (Patient not taking: Reported on 07/31/2015) 90 tablet 3   No current facility-administered medications for this visit.    No Known Allergies    Review of Systems negative except from HPI and PMH  Physical Exam BP 140/80 mmHg  Pulse 68  Ht 5\' 8"  (1.727 m)  Wt 186 lb (84.369 kg)  BMI 28.29 kg/m2 Well developed and well nourished in no acute distress HENT normal E scleral and icterus clear Neck Supple JVP flat; carotids brisk and full Clear to ausculation Device pocket well healed; without hematoma or erythema.  There is no tethering small keloid Regular rate and rhythm, no murmurs gallops or rub Soft with active bowel sounds No clubbing cyanosis  Edema Alert and oriented, grossly normal motor and sensory function Skin Warm and Dry    Assessment and  Plan  Aborted cardiac arrest  Ventricular tachycardia-nonsustained  Nonischemic cardiomyopathy  IVCD  Implantable  defibrillator-Medtronic  The patient's device was interrogated and the information was fully reviewed.  The device was reprogrammed to maximize longevity  ER records reviewed  contihnue current therapy  30 day monitor not available

## 2015-08-05 NOTE — Patient Instructions (Signed)
Medication Instructions: - Your physician recommends that you continue on your current medications as directed. Please refer to the Current Medication list given to you today.  Labwork: - none  Procedures/Testing: - none  Follow-Up: - Remote monitoring is used to monitor your Pacemaker of ICD from home. This monitoring reduces the number of office visits required to check your device to one time per year. It allows [place] to keep an eye on the functioning of your device to ensure it is working properly. You are scheduled for a device check from home on 11/04/15. You may send your transmission at any time that day. If you have a wireless device, the transmission will be sent automatically. After your physician reviews your transmission, you will receive a postcard with your next transmission date.   - Your physician wants you to follow-up in: 1 year with Dr. Graciela Husbands. You will receive a reminder letter in the mail two months in advance. If you don't receive a letter, please call our office to schedule the follow-up appointment.  Any Additional Special Instructions Will Be Listed Below (If Applicable).     If you need a refill on your cardiac medications before your next appointment, please call your pharmacy.

## 2015-08-09 ENCOUNTER — Ambulatory Visit (HOSPITAL_COMMUNITY)
Admission: RE | Admit: 2015-08-09 | Discharge: 2015-08-09 | Disposition: A | Discharge status: Home / Self Care | Payer: BLUE CROSS/BLUE SHIELD | Source: Ambulatory Visit | Attending: Cardiology | Admitting: Cardiology

## 2015-08-09 VITALS — BP 130/82 | HR 60 | Wt 190.5 lb

## 2015-08-09 DIAGNOSIS — I447 Left bundle-branch block, unspecified: Secondary | ICD-10-CM | POA: Insufficient documentation

## 2015-08-09 DIAGNOSIS — I4729 Other ventricular tachycardia: Secondary | ICD-10-CM

## 2015-08-09 DIAGNOSIS — I5022 Chronic systolic (congestive) heart failure: Secondary | ICD-10-CM

## 2015-08-09 DIAGNOSIS — Z79899 Other long term (current) drug therapy: Secondary | ICD-10-CM | POA: Insufficient documentation

## 2015-08-09 DIAGNOSIS — Z7982 Long term (current) use of aspirin: Secondary | ICD-10-CM | POA: Diagnosis not present

## 2015-08-09 DIAGNOSIS — I472 Ventricular tachycardia, unspecified: Secondary | ICD-10-CM

## 2015-08-09 DIAGNOSIS — E785 Hyperlipidemia, unspecified: Secondary | ICD-10-CM | POA: Insufficient documentation

## 2015-08-09 DIAGNOSIS — I429 Cardiomyopathy, unspecified: Secondary | ICD-10-CM | POA: Diagnosis not present

## 2015-08-09 DIAGNOSIS — I110 Hypertensive heart disease with heart failure: Secondary | ICD-10-CM | POA: Insufficient documentation

## 2015-08-09 MED ORDER — CARVEDILOL 12.5 MG PO TABS: 18.7500 mg | ORAL_TABLET | Freq: Two times a day (BID) | ORAL | Status: DC

## 2015-08-09 NOTE — Patient Instructions (Signed)
Change Carvedilol dose to equal 18.75 mg Twice daily, we have sent you in a prescription for 12.5 mg tablets please take 1.5 tabs Twice daily   Your physician recommends that you schedule a follow-up appointment in: 2 months

## 2015-08-10 ENCOUNTER — Other Ambulatory Visit (HOSPITAL_COMMUNITY): Payer: Self-pay | Admitting: *Deleted

## 2015-08-10 MED ORDER — SACUBITRIL-VALSARTAN 24-26 MG PO TABS: 1.0000 | ORAL_TABLET | Freq: Two times a day (BID) | ORAL | Status: DC

## 2015-08-10 NOTE — Progress Notes (Signed)
Patient ID: Brian Noble, male   DOB: 10-21-1960, 55 y.o.   MRN: [id number]   PCP: Dr. Marina Goodell Cardiology: Dr Shirlee Latch  55 yo with a prior history of cardiomyopathy who developed a cardiac arrest and was found to have low EF and normal coronaries.  Per patient, he was found to have low EF around 20% about 8 years ago.  He was seen by a cardiologist in [place] (he thinks), and EF went back to normal range.  He had no problems up until 8/16.  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  He was noted to pass out by family.  EMS was called and arrived quickly.  He was in either ventricular fibrillation or VT and was defibrillated.  He was cooled and sent for coronary angiography, which showed no significant coronary disease.  Echo showed EF 10% with severe RV dysfunction.  After recovery, he got a Medtronic ICD.  Repeat echo in 11/16 showed EF 20% with septal-lateral dyssynchrony, moderately decreased RV systolic function.    Today he returns for HF follow up. Less fatigue after cutting back on Coreg from 25 bid to 12.5 bid.  However, event monitor in 4/17 showed multiple runs of NSVT => occasional palpitations likely due to this.  Unable to take Bidil due to severe headaches.  No lightheadedness or syncope.  No dyspnea walking on flat ground or up a flight of steps . 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    ECG: NSR, LVH, IVCD 140 msec  Optivol reviewed: Fluid index < threshold, impedance trending up.   Labs (9/16): K 4.2, creatinine 1.04 Labs (01/04/2015) : K 4.6 Creatinine 1.09, SPEP negative Labs (11/16): K 5.5, creatinine 1.29 Labs (02/17/2015): K 5.0 creatinine 1.2 Labs (06/02/2015): K 4.6 Creatinine 1.24, uric acid 9.9 Labs (4/17): TSH normal Labs (5/17): K 4, creatinine 1.13, BNP 187, HCT 45.1  PMH: 1. LBBB 2. HTN: x years 3. Hyperlipidemia 4. Chronic systolic CHF: Patient was told around 2008 that his EF was about 20%.  He says that it  recovered back to normal.  He thinks he may have seen a cardiologist in [place] at that time. On 11/10/14, he was admitted after having ventricular fibrillation versus VT arrest and being shocked by AED.   - LHC (8/16): Normal coronaries, EF < 25%. - Echo (8/16): EF 10%, diffuse hypokinesis, mild MR, severely decreased RV systolic function.  - Medtronic ICD (8/16).   - Echo (11/16): EF 20%, septal-lateral dyssynchrony, mildly dilated LV with mild LVH, moderately decreased RV systolic function.  - CPX (3/17): Peak VO2 23.1 (69% predicted peak VO2) VE/VCO2 slope: 32 OUES: 2.00 Peak RER: 1.11 Ventilatory Threshold: 17.7 (53% predicted or measured peak VO2) => Mild functional impairment. 5. Event monitor (4/17): No atrial fibrillation.  Runs NSVT noted.    FH: Sister with heart transplant, father with cardiac arrest when about 54, 2 other sisters with "heart problems."  He has 10 siblings in all.   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  ROS: All systems reviewed and negative except as per HPI.   Current Outpatient Prescriptions  Medication Sig Dispense Refill  . aspirin EC 81 MG tablet Take 81 mg by mouth daily.    . carvedilol (COREG) 12.5 MG tablet Take 1.5 tablets (18.75 mg total) by mouth 2 (two) times daily. 90 tablet 3  . colchicine 0.6 MG tablet Take 0.6  mg by mouth daily as needed (for flare ups).    . furosemide (LASIX) 40 MG tablet Take 1 tablet (40 mg total) by mouth every other day. 30 tablet 0  . lovastatin (MEVACOR) 20 MG tablet Take 20 mg by mouth every morning.     . magnesium oxide (MAG-OX) 400 MG tablet Take 1 tablet (400 mg total) by mouth 2 (two) times daily. 60 tablet 0  . sacubitril-valsartan (ENTRESTO) 24-26 MG Take 1 tablet by mouth 2 (two) times daily. 60 tablet 6  . spironolactone (ALDACTONE) 25 MG tablet Take 1 tablet (25 mg total) by mouth daily. 30 tablet 3  . allopurinol (ZYLOPRIM) 100 MG tablet Take 1 tablet (100 mg  total) by mouth daily. Start medication on 06/07/2015 (Patient not taking: Reported on 07/31/2015) 30 tablet 3  . isosorbide-hydrALAZINE (BIDIL) 20-37.5 MG tablet Take 1 tablet by mouth 3 (three) times daily. (Patient not taking: Reported on 07/31/2015) 90 tablet 3   No current facility-administered medications for this encounter.   BP 130/82 mmHg  Pulse 60  Wt 190 lb 8 oz (86.41 kg)  SpO2 96% General: NAD. Ambulated in the without difficulty.  Neck: No JVD, no thyromegaly or thyroid nodule.  Lungs: Clear to auscultation bilaterally with normal respiratory effort. CV: Nondisplaced PMI.  Heart regular S1/S2, no S3/S4, 1/6 SEM RUSB.  No peripheral edema.  No carotid bruit.  Normal pedal pulses.  Abdomen: Soft, nontender, no hepatosplenomegaly, no distention.  Skin: Intact without lesions or rashes.  Neurologic: Alert and oriented x 3.  Psych: Normal affect. Extremities: No clubbing or cyanosis.  HEENT: Normal.   Assessment/Plan: 1. Chronic systolic CHF: Nonischemic cardiomyopathy.  Medtronic ICD.  Possibly familial CMP given father with SCD at 69, sister with heart transplant, and 2 other sisters with "heart problems."  Cannot rule out prior viral myocarditis or role for HTN. Last echo 2016 with EF 20%, septal-lateral dyssynchrony.  CPX in 3/17 with mild functional impairment.  NYHA class II symptoms currently, volume status stable and verified with Optivol.  Main complaint is significant fatigue, he gets really tired with his job.  He is having to do more heavy lifting now on his job.  - Continue Lasix every other day.   - With runs of NSVT, increase Coreg back to 18.75 mg bid.    - Continue Entresto 24/26 bid, increase at next appt.   - Unable to tolerate Bidil due to headaches.  - Continue spironolactone.  - Given possible familial CMP, recommended that Mr Holme siblings all be screened by echo.  He has 10 siblings. 2. Cardiac arrest: Now has Medtronic ICD. Has follow up with Dr Graciela Husbands.    3. Fatigue: Offered sleep study however he wants to hold off.  4. NSVT: Recent 30 day monitor did not show atrial fibrillation but did show short runs of NSVT. Increase Coreg back to 18.75 mg bid.  Followup in 2 months.  I think he is going to have a hard time at his current job unless he can go on light duty.     Marca Ancona 08/10/2015

## 2015-08-25 ENCOUNTER — Encounter: Payer: Self-pay | Admitting: Internal Medicine

## 2015-10-12 ENCOUNTER — Ambulatory Visit (HOSPITAL_COMMUNITY)
Admission: RE | Admit: 2015-10-12 | Discharge: 2015-10-12 | Disposition: A | Discharge status: Home / Self Care | Payer: BLUE CROSS/BLUE SHIELD | Source: Ambulatory Visit | Attending: Cardiology | Admitting: Cardiology

## 2015-10-12 ENCOUNTER — Encounter (HOSPITAL_COMMUNITY): Payer: Self-pay

## 2015-10-12 VITALS — BP 120/68 | HR 73 | Wt 191.8 lb

## 2015-10-12 DIAGNOSIS — I428 Other cardiomyopathies: Secondary | ICD-10-CM | POA: Diagnosis not present

## 2015-10-12 DIAGNOSIS — I447 Left bundle-branch block, unspecified: Secondary | ICD-10-CM | POA: Diagnosis not present

## 2015-10-12 DIAGNOSIS — Z8249 Family history of ischemic heart disease and other diseases of the circulatory system: Secondary | ICD-10-CM | POA: Diagnosis not present

## 2015-10-12 DIAGNOSIS — Z8674 Personal history of sudden cardiac arrest: Secondary | ICD-10-CM | POA: Diagnosis not present

## 2015-10-12 DIAGNOSIS — Z95810 Presence of automatic (implantable) cardiac defibrillator: Secondary | ICD-10-CM | POA: Insufficient documentation

## 2015-10-12 DIAGNOSIS — I110 Hypertensive heart disease with heart failure: Secondary | ICD-10-CM | POA: Insufficient documentation

## 2015-10-12 DIAGNOSIS — I471 Supraventricular tachycardia: Secondary | ICD-10-CM | POA: Diagnosis not present

## 2015-10-12 DIAGNOSIS — Z7982 Long term (current) use of aspirin: Secondary | ICD-10-CM | POA: Diagnosis not present

## 2015-10-12 DIAGNOSIS — Z79899 Other long term (current) drug therapy: Secondary | ICD-10-CM | POA: Diagnosis not present

## 2015-10-12 DIAGNOSIS — I5022 Chronic systolic (congestive) heart failure: Secondary | ICD-10-CM | POA: Diagnosis not present

## 2015-10-12 DIAGNOSIS — R5383 Other fatigue: Secondary | ICD-10-CM | POA: Diagnosis not present

## 2015-10-12 DIAGNOSIS — E785 Hyperlipidemia, unspecified: Secondary | ICD-10-CM | POA: Diagnosis not present

## 2015-10-12 LAB — BASIC METABOLIC PANEL
Anion gap: 6 (ref 5–15)
BUN: 16 mg/dL (ref 6–20)
CO2: 25 mmol/L (ref 22–32)
Calcium: 9.4 mg/dL (ref 8.9–10.3)
Chloride: 106 mmol/L (ref 101–111)
Creatinine, Ser: 1.21 mg/dL (ref 0.61–1.24)
GFR calc Af Amer: >60 mL/min (ref >60)
GFR calc non Af Amer: >60 mL/min (ref >60)
Glucose, Bld: 98 mg/dL (ref 65–99)
Potassium: 4.2 mmol/L (ref 3.5–5.1)
Sodium: 137 mmol/L (ref 135–145)

## 2015-10-12 MED ORDER — SACUBITRIL-VALSARTAN 49-51 MG PO TABS: 1.0000 | ORAL_TABLET | Freq: Two times a day (BID) | ORAL | 6 refills | Status: DC

## 2015-10-12 NOTE — Patient Instructions (Signed)
Increase Entresto to 49/51 mg Twice daily   Labs today  Labs in 2 weeks  We will contact you in 3 months to schedule your next appointment.  

## 2015-10-13 NOTE — Progress Notes (Signed)
Patient ID: Brian Noble, male   DOB: 05/20/1960, 55 y.o.   MRN: [id number]   PCP: Dr. Marina Goodell Cardiology: Dr Shirlee Latch  55 yo with a prior history of cardiomyopathy who developed a cardiac arrest and was found to have low EF and normal coronaries.  Per patient, he was found to have low EF around 20% about 8 years ago.  He was seen by a cardiologist in [place] (he thinks), and EF went back to normal range.  He had no problems up until 8/16.  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  He was noted to pass out by family.  EMS was called and arrived quickly.  He was in either ventricular fibrillation or VT and was defibrillated.  He was cooled and sent for coronary angiography, which showed no significant coronary disease.  Echo showed EF 10% with severe RV dysfunction.  After recovery, he got a Medtronic ICD.  Repeat echo in 11/16 showed EF 20% with septal-lateral dyssynchrony, moderately decreased RV systolic function.    Today he returns for HF follow up. Less fatigue after cutting back on Coreg from 25 bid to 12.5 bid.  However, event monitor in 4/17 showed multiple runs of NSVT => occasional palpitations likely due to this.  Coreg increased back to 18.75 mg bid.  Unable to take Bidil due to severe headaches.  No lightheadedness or syncope.  No dyspnea walking on flat ground or up a flight of steps. He is very active during the day, approximately 8 hrs activity based on device interrogation.  No further palpitations.    ECG (5/17): NSR, LBBB 140 msec  Optivol reviewed: Fluid index < threshold, impedance stable.  8 hrs activity.  Periods of tachycardia back in 5/17, ?NSVT episodes.  These seem to have resolved.   Labs (9/16): K 4.2, creatinine 1.04 Labs (01/04/2015) : K 4.6 Creatinine 1.09, SPEP negative Labs (11/16): K 5.5, creatinine 1.29 Labs (02/17/2015): K 5.0 creatinine 1.2 Labs (06/02/2015): K 4.6 Creatinine 1.24, uric acid 9.9 Labs (4/17): TSH normal Labs (5/17): K 4, creatinine  1.13, BNP 187, HCT 45.1  PMH: 1. LBBB 2. HTN: x years 3. Hyperlipidemia 4. Chronic systolic CHF: Patient was told around 2008 that his EF was about 20%.  He says that it recovered back to normal.  He thinks he may have seen a cardiologist in [place] at that time. On 11/10/14, he was admitted after having ventricular fibrillation versus VT arrest and being shocked by AED.   - LHC (8/16): Normal coronaries, EF < 25%. - Echo (8/16): EF 10%, diffuse hypokinesis, mild MR, severely decreased RV systolic function.  - Medtronic ICD (8/16).   - Echo (11/16): EF 20%, septal-lateral dyssynchrony, mildly dilated LV with mild LVH, moderately decreased RV systolic function.  - CPX (3/17): Peak VO2 23.1 (69% predicted peak VO2) VE/VCO2 slope: 32 OUES: 2.00 Peak RER: 1.11, Ventilatory Threshold: 17.7 (53% predicted or measured peak VO2) => Mild functional impairment. 5. Event monitor (4/17): No atrial fibrillation.  Runs NSVT noted.    FH: Sister with heart transplant, father with cardiac arrest when about 38, 2 other sisters with "heart problems."  He has 10 siblings in all.   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  ROS: All systems reviewed and negative except as per HPI.   Current Outpatient Prescriptions  Medication Sig Dispense Refill  . aspirin EC 81 MG tablet Take 81 mg by mouth daily.    Marland Kitchen  carvedilol (COREG) 12.5 MG tablet Take 1.5 tablets (18.75 mg total) by mouth 2 (two) times daily. 90 tablet 3  . colchicine 0.6 MG tablet Take 0.6 mg by mouth daily as needed (for flare ups).    . furosemide (LASIX) 40 MG tablet Take 1 tablet (40 mg total) by mouth every other day. 30 tablet 0  . lovastatin (MEVACOR) 20 MG tablet Take 20 mg by mouth every morning.     . magnesium oxide (MAG-OX) 400 MG tablet Take 1 tablet (400 mg total) by mouth 2 (two) times daily. 60 tablet 0  . spironolactone (ALDACTONE) 25 MG tablet Take 1 tablet (25 mg total) by mouth daily. 30  tablet 3  . sacubitril-valsartan (ENTRESTO) 49-51 MG Take 1 tablet by mouth 2 (two) times daily. 60 tablet 6   No current facility-administered medications for this encounter.    BP 120/68   Pulse 73   Wt 191 lb 12 oz (87 kg)   SpO2 99%   BMI 29.16 kg/m  General: NAD. Ambulated in the without difficulty.  Neck: No JVD, no thyromegaly or thyroid nodule.  Lungs: Clear to auscultation bilaterally with normal respiratory effort. CV: Nondisplaced PMI.  Heart regular S1/S2, no S3/S4, 1/6 SEM RUSB.  No peripheral edema.  No carotid bruit.  Normal pedal pulses.  Abdomen: Soft, nontender, no hepatosplenomegaly, no distention.  Skin: Intact without lesions or rashes.  Neurologic: Alert and oriented x 3.  Psych: Normal affect. Extremities: No clubbing or cyanosis.  HEENT: Normal.   Assessment/Plan: 1. Chronic systolic CHF: Nonischemic cardiomyopathy.  Medtronic ICD.  Possibly familial CMP given father with SCD at 86, sister with heart transplant, and 2 other sisters with "heart problems."  Cannot rule out prior viral myocarditis or role for HTN. Last echo 2016 with EF 20%, septal-lateral dyssynchrony.  CPX in 3/17 with mild functional impairment.  NYHA class II symptoms currently, volume status stable and verified with Optivol.  Very active during the day.  - Continue Lasix at current dosing.   - Continue Coreg 18.75 mg bid.    - Increase Entresto to 49/51 bid.   - Unable to tolerate Bidil due to headaches.  - Continue spironolactone. BMET today.  - Given possible familial CMP, recommended that Mr Zacharia siblings all be screened by echo.  He has 10 siblings. - LBBB with dyssynchrony on last echo, ?CRT benefit => discuss with Dr Graciela Husbands.  2. Cardiac arrest: Now has Medtronic ICD. Has follow up with Dr Graciela Husbands.  3. Fatigue: Offered sleep study however he wants to hold off.  4. NSVT: 30 day monitor 4/17 did not show atrial fibrillation but did show short runs of NSVT. Increased Coreg back to 18.75 mg  bid and device check today did not show any significant tachycardia runs.   Marca Ancona 10/13/2015

## 2015-10-29 ENCOUNTER — Ambulatory Visit (HOSPITAL_COMMUNITY)
Admission: RE | Admit: 2015-10-29 | Discharge: 2015-10-29 | Disposition: A | Discharge status: Home / Self Care | Payer: BLUE CROSS/BLUE SHIELD | Source: Ambulatory Visit | Attending: Internal Medicine | Admitting: Internal Medicine

## 2015-10-29 DIAGNOSIS — I5022 Chronic systolic (congestive) heart failure: Secondary | ICD-10-CM | POA: Insufficient documentation

## 2015-10-29 LAB — BASIC METABOLIC PANEL
Anion gap: 8 (ref 5–15)
BUN: 14 mg/dL (ref 6–20)
CO2: 27 mmol/L (ref 22–32)
Calcium: 9.4 mg/dL (ref 8.9–10.3)
Chloride: 102 mmol/L (ref 101–111)
Creatinine, Ser: 1.22 mg/dL (ref 0.61–1.24)
GFR calc Af Amer: >60 mL/min (ref >60)
GFR calc non Af Amer: >60 mL/min (ref >60)
Glucose, Bld: 92 mg/dL (ref 65–99)
Potassium: 4.7 mmol/L (ref 3.5–5.1)
Sodium: 137 mmol/L (ref 135–145)

## 2015-11-04 ENCOUNTER — Telehealth: Payer: Self-pay | Admitting: Cardiology

## 2015-11-04 ENCOUNTER — Ambulatory Visit (INDEPENDENT_AMBULATORY_CARE_PROVIDER_SITE_OTHER): Payer: BLUE CROSS/BLUE SHIELD | Admitting: *Deleted

## 2015-11-04 DIAGNOSIS — I472 Ventricular tachycardia, unspecified: Secondary | ICD-10-CM

## 2015-11-04 DIAGNOSIS — I469 Cardiac arrest, cause unspecified: Secondary | ICD-10-CM

## 2015-11-04 NOTE — Telephone Encounter (Signed)
LMOVM reminding pt to send remote transmission.   

## 2015-11-05 ENCOUNTER — Encounter: Payer: Self-pay | Admitting: Cardiology

## 2015-11-05 NOTE — Progress Notes (Signed)
Remote ICD transmission.   

## 2015-11-23 LAB — CUP PACEART REMOTE DEVICE CHECK
Battery Remaining Longevity: 133 mo
Battery Voltage: 3.03 V
Brady Statistic RV Percent Paced: 0.02 %
Date Time Interrogation Session: 20170817211730
HighPow Impedance: 67 Ohm
Implantable Lead Implant Date: 20160829
Implantable Lead Location: 753860
Lead Channel Impedance Value: 399 Ohm
Lead Channel Impedance Value: 456 Ohm
Lead Channel Pacing Threshold Amplitude: 0.625 V
Lead Channel Pacing Threshold Pulse Width: 0.4 ms
Lead Channel Sensing Intrinsic Amplitude: 26.75 mV
Lead Channel Sensing Intrinsic Amplitude: 26.75 mV
Lead Channel Setting Pacing Amplitude: 2 V
Lead Channel Setting Pacing Pulse Width: 0.4 ms
Lead Channel Setting Sensing Sensitivity: 0.3 mV

## 2015-12-09 ENCOUNTER — Encounter: Payer: Self-pay | Admitting: Internal Medicine

## 2015-12-10 ENCOUNTER — Telehealth (HOSPITAL_COMMUNITY): Payer: Self-pay

## 2015-12-10 NOTE — Telephone Encounter (Signed)
Patient's wife left VM on CHF clinic triage line asking for clarification on Coreg dosing. Patient has been taking 1.5 of a 12.5 mg tabelt (18.75 mg) twice daily, however, refill received today with bottle directions to take 1 12.5 mg tablet twice daily. Left return VM stating that patient and wife are doing correct dose of 18.75 mg twice daily. Unsure how this was sent in incorrectly as Rx in epic is still correct and last documented order was in may of this year. Advised to call office back with any further questions/concerns/needs.  Ave Filter, RN

## 2015-12-14 ENCOUNTER — Other Ambulatory Visit (HOSPITAL_COMMUNITY): Payer: Self-pay | Admitting: *Deleted

## 2015-12-14 DIAGNOSIS — I472 Ventricular tachycardia, unspecified: Secondary | ICD-10-CM

## 2015-12-14 MED ORDER — CARVEDILOL 12.5 MG PO TABS: 18.7500 mg | ORAL_TABLET | Freq: Two times a day (BID) | ORAL | 3 refills | Status: DC

## 2016-01-12 ENCOUNTER — Ambulatory Visit (HOSPITAL_COMMUNITY)
Admission: RE | Admit: 2016-01-12 | Discharge: 2016-01-12 | Disposition: A | Discharge status: Home / Self Care | Payer: BLUE CROSS/BLUE SHIELD | Source: Ambulatory Visit | Attending: Cardiology | Admitting: Cardiology

## 2016-01-12 VITALS — BP 124/62 | HR 58 | Wt 196.6 lb

## 2016-01-12 DIAGNOSIS — I429 Cardiomyopathy, unspecified: Secondary | ICD-10-CM | POA: Insufficient documentation

## 2016-01-12 DIAGNOSIS — I472 Ventricular tachycardia: Secondary | ICD-10-CM

## 2016-01-12 DIAGNOSIS — I447 Left bundle-branch block, unspecified: Secondary | ICD-10-CM | POA: Diagnosis not present

## 2016-01-12 DIAGNOSIS — Z7982 Long term (current) use of aspirin: Secondary | ICD-10-CM | POA: Diagnosis not present

## 2016-01-12 DIAGNOSIS — R5383 Other fatigue: Secondary | ICD-10-CM | POA: Diagnosis not present

## 2016-01-12 DIAGNOSIS — I4729 Other ventricular tachycardia: Secondary | ICD-10-CM

## 2016-01-12 DIAGNOSIS — Z79899 Other long term (current) drug therapy: Secondary | ICD-10-CM | POA: Diagnosis not present

## 2016-01-12 DIAGNOSIS — Z8674 Personal history of sudden cardiac arrest: Secondary | ICD-10-CM | POA: Insufficient documentation

## 2016-01-12 DIAGNOSIS — I110 Hypertensive heart disease with heart failure: Secondary | ICD-10-CM | POA: Insufficient documentation

## 2016-01-12 DIAGNOSIS — I5022 Chronic systolic (congestive) heart failure: Secondary | ICD-10-CM | POA: Diagnosis not present

## 2016-01-12 DIAGNOSIS — E785 Hyperlipidemia, unspecified: Secondary | ICD-10-CM | POA: Diagnosis not present

## 2016-01-12 DIAGNOSIS — I469 Cardiac arrest, cause unspecified: Secondary | ICD-10-CM | POA: Diagnosis not present

## 2016-01-12 DIAGNOSIS — Z95810 Presence of automatic (implantable) cardiac defibrillator: Secondary | ICD-10-CM | POA: Diagnosis not present

## 2016-01-12 DIAGNOSIS — I428 Other cardiomyopathies: Secondary | ICD-10-CM

## 2016-01-12 LAB — BASIC METABOLIC PANEL
Anion gap: 8 (ref 5–15)
BUN: 17 mg/dL (ref 6–20)
CO2: 25 mmol/L (ref 22–32)
Calcium: 9.3 mg/dL (ref 8.9–10.3)
Chloride: 104 mmol/L (ref 101–111)
Creatinine, Ser: 1.37 mg/dL — ABNORMAL HIGH (ref 0.61–1.24)
GFR calc Af Amer: >60 mL/min (ref >60)
GFR calc non Af Amer: 57 mL/min — ABNORMAL LOW (ref >60)
Glucose, Bld: 107 mg/dL — ABNORMAL HIGH (ref 65–99)
Potassium: 4.3 mmol/L (ref 3.5–5.1)
Sodium: 137 mmol/L (ref 135–145)

## 2016-01-12 LAB — MAGNESIUM: Magnesium: 2.1 mg/dL (ref 1.7–2.4)

## 2016-01-12 MED ORDER — SILDENAFIL CITRATE 20 MG PO TABS: 20.0000 mg | ORAL_TABLET | Freq: Every day | ORAL | 0 refills | Status: DC | PRN

## 2016-01-12 MED ORDER — SILDENAFIL CITRATE 20 MG PO TABS: 10.0000 mg | ORAL_TABLET | Freq: Every day | ORAL | 0 refills | Status: DC | PRN

## 2016-01-12 NOTE — Progress Notes (Signed)
Patient ID: Brian Noble, male   DOB: [birth year], 55 y.o.   MRN: [id number]   PCP: Brian Noble Cardiology: Brian Noble  55 yo with a prior history of cardiomyopathy who developed a cardiac arrest and was found to have low EF and normal coronaries.  Per patient, he was found to have low EF around 20% about 8 years ago.  He was seen by a cardiologist in [place] (he thinks), and EF went back to normal range.  He had no problems up until 8/16.  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  He was noted to pass out by family.  EMS was called and arrived quickly.  He was in either ventricular fibrillation or VT and was defibrillated.  He was cooled and sent for coronary angiography, which showed no significant coronary disease.  Echo showed EF 10% with severe RV dysfunction.  After recovery, he got a Medtronic ICD.  Repeat echo in 11/16 showed EF 20% with septal-lateral dyssynchrony, moderately decreased RV systolic function.    Today he returns for HF follow up. Last visit coreg was increased to 18.5 mg twice a day. Overall feeling ok. Denies SOB/PND/Orthopnea. Working 50 hours a week. Taking all medications.   Optivol- roximately 8 hrs activity based on device interrogation.    ECG (5/17): NSR, LBBB 140 msec  Optivol reviewed: Fluid index < threshold, impedance stable.  8 hrs activity.     Labs (9/16): K 4.2, creatinine 1.04 Labs (01/04/2015) : K 4.6 Creatinine 1.09, SPEP negative Labs (11/16): K 5.5, creatinine 1.29 Labs (02/17/2015): K 5.0 creatinine 1.2 Labs (06/02/2015): K 4.6 Creatinine 1.24, uric acid 9.9 Labs (4/17): TSH normal Labs (5/17): K 4, creatinine 1.13, BNP 187, HCT 45.1  PMH: 1. LBBB 2. HTN: x years 3. Hyperlipidemia 4. Chronic systolic CHF: Patient was told around 2008 that his EF was about 20%.  He says that it recovered back to normal.  He thinks he may have seen a cardiologist in [place] at that time. On 11/10/14, he was admitted after having ventricular  fibrillation versus VT arrest and being shocked by AED.   - LHC (8/16): Normal coronaries, EF < 25%. - Echo (8/16): EF 10%, diffuse hypokinesis, mild Brian, severely decreased RV systolic function.  - Medtronic ICD (8/16).   - Echo (11/16): EF 20%, septal-lateral dyssynchrony, mildly dilated LV with mild LVH, moderately decreased RV systolic function.  - CPX (3/17): Peak VO2 23.1 (69% predicted peak VO2) VE/VCO2 slope: 32 OUES: 2.00 Peak RER: 1.11, Ventilatory Threshold: 17.7 (53% predicted or measured peak VO2) => Mild functional impairment. 5. Event monitor (4/17): No atrial fibrillation.  Runs NSVT noted.    FH: Sister with heart transplant, father with cardiac arrest when about 740, 2 other sisters with "heart problems."  He has 10 siblings in all.   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  ROS: All systems reviewed and negative except as per HPI.   Current Outpatient Prescriptions  Medication Sig Dispense Refill  . aspirin EC 81 MG tablet Take 81 mg by mouth daily.    . carvedilol (COREG) 12.5 MG tablet Take 1.5 tablets (18.75 mg total) by mouth 2 (two) times daily. 90 tablet 3  . colchicine 0.6 MG tablet Take 0.6 mg by mouth daily as needed (for flare ups).    . furosemide (LASIX) 40 MG tablet Take 1 tablet (40 mg total) by mouth every other day. 30 tablet 0  .  lovastatin (MEVACOR) 20 MG tablet Take 20 mg by mouth every morning.     . magnesium oxide (MAG-OX) 400 MG tablet Take 1 tablet (400 mg total) by mouth 2 (two) times daily. 60 tablet 0  . sacubitril-valsartan (ENTRESTO) 49-51 MG Take 1 tablet by mouth 2 (two) times daily. 60 tablet 6  . spironolactone (ALDACTONE) 25 MG tablet Take 1 tablet (25 mg total) by mouth daily. 30 tablet 3   No current facility-administered medications for this encounter.    BP 124/62 (BP Location: Left Arm, Patient Position: Sitting, Cuff Size: Large)   Pulse (!) 58   Wt 196 lb 9.6 oz (89.2 kg)   SpO2 96%    BMI 29.89 kg/m  General: NAD. Ambulated in the without difficulty. Wife present Neck: No JVD, no thyromegaly or thyroid nodule.  Lungs: Clear  CV: Nondisplaced PMI.  Heart regular S1/S2, no S3/S4, 1/6 SEM RUSB.  No peripheral edema.  No carotid bruit.  Normal pedal pulses.  Abdomen: Soft, nontender, no hepatosplenomegaly, no distention.  Skin: Intact without lesions or rashes.  Neurologic: Alert and oriented x 3.  Psych: Normal affect. Extremities: No clubbing or cyanosis.  HEENT: Normal.   Assessment/Plan: 1. Chronic systolic CHF: Nonischemic cardiomyopathy.  Medtronic ICD.  Possibly familial CMP given father with SCD at 29, sister with heart transplant, and 2 other sisters with "heart problems."  Cannot rule out prior viral myocarditis or role for HTN. Last echo 2016 with EF 20%, septal-lateral dyssynchrony.  CPX in 3/17 with mild functional impairment.   NYHA class II symptoms currently, volume status stable and verified with Optivol.  Activity ~8 hours per day.  - Continue Lasix at current dosing.   - Continue Coreg 18.75 mg bid.    - Continue Entresto to 49/51 bid.   - Unable to tolerate Bidil due to headaches.  - Continue spironolactone.  BMET today.  - Given possible familial CMP, recommended that Brian Noble siblings all be screened by echo.  He has 10 siblings. - LBBB with dyssynchrony on last echo, ?CRT benefit => discuss with Brian Noble.  2. Cardiac arrest: Now has Medtronic ICD. Has follow up with Brian Noble.  3. Fatigue: Offered sleep study however he wants to hold off.  4. NSVT: 30 day monitor 4/17 did not show atrial fibrillation but did show short runs of NSVT. Continue coreg 18.75 mg bid.   Check BMET and Mag today  Follow up in 6 weeks with an ECHO and Brian Noble.     Brian Clegg NP-C  01/12/2016

## 2016-01-12 NOTE — Patient Instructions (Addendum)
Routine lab work today. Will notify you of abnormal results, otherwise no news is good news!  Take Sildenafil 10 mg (1/2 tablet) once daily as needed for erectile dysfunction.  Follow up with echocardiogram and appointment with Dr. Shirlee Latch in 6 weeks.  Do the following things EVERYDAY: 1) Weigh yourself in the morning before breakfast. Write it down and keep it in a log. 2) Take your medicines as prescribed 3) Eat low salt foods-Limit salt (sodium) to 2000 mg per day.  4) Stay as active as you can everyday 5) Limit all fluids for the day to less than 2 liters

## 2016-02-03 ENCOUNTER — Ambulatory Visit (INDEPENDENT_AMBULATORY_CARE_PROVIDER_SITE_OTHER): Payer: BLUE CROSS/BLUE SHIELD | Admitting: *Deleted

## 2016-02-03 DIAGNOSIS — I469 Cardiac arrest, cause unspecified: Secondary | ICD-10-CM | POA: Diagnosis not present

## 2016-02-03 NOTE — Progress Notes (Signed)
Remote ICD transmission.   

## 2016-02-09 ENCOUNTER — Encounter: Payer: Self-pay | Admitting: Cardiology

## 2016-02-12 LAB — CUP PACEART REMOTE DEVICE CHECK
Battery Remaining Longevity: 132 mo
Battery Voltage: 3.04 V
Brady Statistic RV Percent Paced: 0.01 %
Date Time Interrogation Session: 20171116062403
HighPow Impedance: 74 Ohm
Implantable Lead Implant Date: 20160829
Implantable Lead Location: 753860
Implantable Pulse Generator Implant Date: 20160829
Lead Channel Impedance Value: 399 Ohm
Lead Channel Impedance Value: 513 Ohm
Lead Channel Pacing Threshold Amplitude: 0.625 V
Lead Channel Pacing Threshold Pulse Width: 0.4 ms
Lead Channel Sensing Intrinsic Amplitude: 19.25 mV
Lead Channel Sensing Intrinsic Amplitude: 19.25 mV
Lead Channel Setting Pacing Amplitude: 2 V
Lead Channel Setting Pacing Pulse Width: 0.4 ms
Lead Channel Setting Sensing Sensitivity: 0.3 mV

## 2016-02-25 ENCOUNTER — Ambulatory Visit (HOSPITAL_COMMUNITY)
Admission: RE | Admit: 2016-02-25 | Discharge: 2016-02-25 | Disposition: A | Discharge status: Home / Self Care | Payer: BLUE CROSS/BLUE SHIELD | Source: Ambulatory Visit | Attending: Legal Medicine | Admitting: Legal Medicine

## 2016-02-25 ENCOUNTER — Other Ambulatory Visit (HOSPITAL_COMMUNITY): Payer: Self-pay | Admitting: *Deleted

## 2016-02-25 ENCOUNTER — Encounter (HOSPITAL_COMMUNITY): Payer: Self-pay

## 2016-02-25 ENCOUNTER — Ambulatory Visit (HOSPITAL_BASED_OUTPATIENT_CLINIC_OR_DEPARTMENT_OTHER)
Admission: RE | Admit: 2016-02-25 | Discharge: 2016-02-25 | Disposition: A | Discharge status: Home / Self Care | Payer: BLUE CROSS/BLUE SHIELD | Source: Ambulatory Visit | Attending: Cardiology | Admitting: Cardiology

## 2016-02-25 VITALS — BP 138/80 | HR 60 | Wt 196.5 lb

## 2016-02-25 DIAGNOSIS — I469 Cardiac arrest, cause unspecified: Secondary | ICD-10-CM | POA: Insufficient documentation

## 2016-02-25 DIAGNOSIS — Z8249 Family history of ischemic heart disease and other diseases of the circulatory system: Secondary | ICD-10-CM | POA: Insufficient documentation

## 2016-02-25 DIAGNOSIS — I429 Cardiomyopathy, unspecified: Secondary | ICD-10-CM | POA: Insufficient documentation

## 2016-02-25 DIAGNOSIS — I110 Hypertensive heart disease with heart failure: Secondary | ICD-10-CM | POA: Diagnosis present

## 2016-02-25 DIAGNOSIS — Z7982 Long term (current) use of aspirin: Secondary | ICD-10-CM | POA: Diagnosis not present

## 2016-02-25 DIAGNOSIS — R51 Headache: Secondary | ICD-10-CM | POA: Diagnosis not present

## 2016-02-25 DIAGNOSIS — I472 Ventricular tachycardia: Secondary | ICD-10-CM | POA: Diagnosis not present

## 2016-02-25 DIAGNOSIS — R5383 Other fatigue: Secondary | ICD-10-CM | POA: Insufficient documentation

## 2016-02-25 DIAGNOSIS — Z79899 Other long term (current) drug therapy: Secondary | ICD-10-CM | POA: Insufficient documentation

## 2016-02-25 DIAGNOSIS — I5022 Chronic systolic (congestive) heart failure: Secondary | ICD-10-CM

## 2016-02-25 DIAGNOSIS — E785 Hyperlipidemia, unspecified: Secondary | ICD-10-CM | POA: Diagnosis not present

## 2016-02-25 LAB — BASIC METABOLIC PANEL
Anion gap: 7 (ref 5–15)
BUN: 13 mg/dL (ref 6–20)
CO2: 25 mmol/L (ref 22–32)
Calcium: 9.7 mg/dL (ref 8.9–10.3)
Chloride: 105 mmol/L (ref 101–111)
Creatinine, Ser: 1.19 mg/dL (ref 0.61–1.24)
GFR calc Af Amer: >60 mL/min (ref >60)
GFR calc non Af Amer: >60 mL/min (ref >60)
Glucose, Bld: 92 mg/dL (ref 65–99)
Potassium: 5.2 mmol/L — ABNORMAL HIGH (ref 3.5–5.1)
Sodium: 137 mmol/L (ref 135–145)

## 2016-02-25 MED ORDER — SACUBITRIL-VALSARTAN 24-26 MG PO TABS: 1.0000 | ORAL_TABLET | Freq: Two times a day (BID) | ORAL | 3 refills | Status: DC

## 2016-02-25 NOTE — Patient Instructions (Signed)
EKG Today.  Routine lab work today. Will notify you of abnormal results, otherwise no news is good news!  DECREASE Entresto 24/26 mg tablet twice daily. You CANNOT 1/2 your current tablets.  These tablets have two different medications in them.  Halfing the tablets may not ensure you are getting the correct dose of each medication. New Rx for correct 24/26 mg tablets have been sent in to your pharmacy.  Follow up 4 months with Dr. Shirlee Latch. We will call you closer to this time, or you may call our office to schedule 1 month before you are due to be seen.  Merry Christmas and Happy New Year!  Do the following things EVERYDAY: 1) Weigh yourself in the morning before breakfast. Write it down and keep it in a log. 2) Take your medicines as prescribed 3) Eat low salt foods-Limit salt (sodium) to 2000 mg per day.  4) Stay as active as you can everyday 5) Limit all fluids for the day to less than 2 liters

## 2016-02-25 NOTE — Progress Notes (Signed)
  Echocardiogram 2D Echocardiogram has been performed.  Arvil Chaco 02/25/2016, 10:04 AM

## 2016-02-26 NOTE — Progress Notes (Signed)
Patient ID: Brian Noble, male   DOB: 1960/03/30, 55 y.o.   MRN: [id number]   PCP: Dr. Marina Noble Cardiology: Dr Brian Noble  55 yo with a prior history of cardiomyopathy who developed a cardiac arrest and was found to have low EF and normal coronaries.  Per patient, he was found to have low EF around 20% about 8 years ago.  He was seen by a cardiologist in [place] (he thinks), and EF went back to normal range.  He had no problems up until 8/16.  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  He was noted to pass out by family.  EMS was called and arrived quickly.  He was in either ventricular fibrillation or VT and was defibrillated.  He was cooled and sent for coronary angiography, which showed no significant coronary disease.  Echo showed EF 10% with severe RV dysfunction.  After recovery, he got a Medtronic ICD.  Repeat echo in 11/16 showed EF 20% with septal-lateral dyssynchrony, moderately decreased RV systolic function.  Unable to take Bidil due to severe headaches.    No complaints today.  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  No exertional dyspnea.  Palpitations, lightheadedness or syncope. Unable to take Entresto 49/51 bid due to fatigue so cutting it in half.  Unable to tolerate Coreg 25 mg bid so back to 18.75 mg bid.  Echo was done today and reviewed, EF 30% with mild to moderately decreased RV systolic function. Weight stable.   ECG: NSR, LVH, QRS 134 msec  Optivol reviewed: Fluid index < threshold, impedance stable.  No atrial fibrillation or VT recorded.   Labs (9/16): K 4.2, creatinine 1.04 Labs (01/04/2015) : K 4.6 Creatinine 1.09, SPEP negative Labs (11/16): K 5.5, creatinine 1.29 Labs (02/17/2015): K 5.0 creatinine 1.2 Labs (06/02/2015): K 4.6 Creatinine 1.24, uric acid 9.9 Labs (4/17): TSH normal Labs (5/17): K 4, creatinine 1.13, BNP 187, HCT 45.1 Labs (10/17): K 4.3, creatinine 1.37  PMH: 1. LBBB 2. HTN: x years 3. Hyperlipidemia 4.  Chronic systolic CHF: Patient was told around 2008 that his EF was about 20%.  He says that it recovered back to normal.  He thinks he may have seen a cardiologist in [place] at that time. On 11/10/14, he was admitted after having ventricular fibrillation versus VT arrest and being shocked by AED.   - LHC (8/16): Normal coronaries, EF < 25%. - Echo (8/16): EF 10%, diffuse hypokinesis, mild MR, severely decreased RV systolic function.  - Medtronic ICD (8/16).   - Echo (11/16): EF 20%, septal-lateral dyssynchrony, mildly dilated LV with mild LVH, moderately decreased RV systolic function.  - CPX (3/17): Peak VO2 23.1 (69% predicted peak VO2) VE/VCO2 slope: 32 OUES: 2.00 Peak RER: 1.11, Ventilatory Threshold: 17.7 (53% predicted or measured peak VO2) => Mild functional impairment. - Echo (12/17): EF 30%, mild LV dilation, septal-lateral dyssynchrony, mild to moderately decreased RV systolic function.  5. Event monitor (4/17): No atrial fibrillation.  Runs NSVT noted.    FH: Sister with heart transplant, father with cardiac arrest when about 17, 2 other sisters with "heart problems."  He has 10 siblings in all.   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  ROS: All systems reviewed and negative except as per HPI.   Current Outpatient Prescriptions  Medication Sig Dispense Refill  . aspirin EC 81 MG tablet Take 81 mg by mouth daily.    Marland Kitchen  carvedilol (COREG) 12.5 MG tablet Take 1.5 tablets (18.75 mg total) by mouth 2 (two) times daily. 90 tablet 3  . colchicine 0.6 MG tablet Take 0.6 mg by mouth daily as needed (for flare ups).    . furosemide (LASIX) 40 MG tablet Take 1 tablet (40 mg total) by mouth every other day. 30 tablet 0  . lovastatin (MEVACOR) 20 MG tablet Take 20 mg by mouth every morning.     . magnesium oxide (MAG-OX) 400 MG tablet Take 1 tablet (400 mg total) by mouth 2 (two) times daily. 60 tablet 0  . sildenafil (REVATIO) 20 MG tablet Take 1  tablet (20 mg total) by mouth daily as needed. For erectile dysfunction. 10 tablet 0  . spironolactone (ALDACTONE) 25 MG tablet Take 1 tablet (25 mg total) by mouth daily. 30 tablet 3  . sacubitril-valsartan (ENTRESTO) 24-26 MG Take 1 tablet by mouth 2 (two) times daily. 60 tablet 3   No current facility-administered medications for this encounter.    BP 138/80   Pulse 60   Wt 196 lb 8 oz (89.1 kg)   SpO2 99%   BMI 29.88 kg/m  General: NAD. Ambulated in the without difficulty.  Neck: No JVD, no thyromegaly or thyroid nodule.  Lungs: Clear to auscultation bilaterally with normal respiratory effort. CV: Nondisplaced PMI.  Heart regular S1/S2, no S3/S4, 1/6 SEM RUSB.  No peripheral edema.  No carotid bruit.  Normal pedal pulses.  Abdomen: Soft, nontender, no hepatosplenomegaly, no distention.  Skin: Intact without lesions or rashes.  Neurologic: Alert and oriented x 3.  Psych: Normal affect. Extremities: No clubbing or cyanosis.  HEENT: Normal.   Assessment/Plan: 1. Chronic systolic CHF: Nonischemic cardiomyopathy.  Medtronic ICD.  Possibly familial CMP given father with SCD at 8540, sister with heart transplant, and 2 other sisters with "heart problems."  Cannot rule out prior viral myocarditis or role for HTN. Echo 12/17 with EF 30%, septal-lateral dyssynchrony.  CPX in 3/17 with mild functional impairment.  NYHA class I-II symptoms currently, volume status stable and verified with Optivol.  Very active during the day.  - Continue Lasix at current dosing.   - Continue Coreg 18.75 mg bid, unable to tolerate increase.    - Decrease Entresto back to 24/26 bid.  He has been cutting the 49/51 pills in half due to excessive fatigue.   - Unable to tolerate Bidil due to headaches.  - Continue spironolactone. BMET today.  - Given possible familial CMP, recommended that Mr Brian Noble's siblings all be screened by echo.  He has 10 siblings. - He has septal-lateral dyssynchrony on echo but QRS not  particularly wide.  Does not definitely fit criteria for CRT.  2. Cardiac arrest: Now has Medtronic ICD. Has follow up with Dr Graciela HusbandsKlein.  3. Fatigue: Offered sleep study however he wants to hold off.  4. NSVT:  Device interrogation today did not show VT.   Followup in 4 months.    Marca AnconaDalton Olia Noble 02/26/2016

## 2016-02-28 ENCOUNTER — Telehealth (HOSPITAL_COMMUNITY): Payer: Self-pay | Admitting: Vascular Surgery

## 2016-02-28 NOTE — Telephone Encounter (Signed)
Send pt med to [facility]

## 2016-02-29 MED ORDER — SACUBITRIL-VALSARTAN 24-26 MG PO TABS: 1.0000 | ORAL_TABLET | Freq: Two times a day (BID) | ORAL | 3 refills | Status: DC

## 2016-03-06 ENCOUNTER — Telehealth (HOSPITAL_COMMUNITY): Payer: Self-pay | Admitting: *Deleted

## 2016-03-06 ENCOUNTER — Other Ambulatory Visit: Payer: Self-pay | Admitting: Internal Medicine

## 2016-03-06 NOTE — Telephone Encounter (Signed)
Basic metabolic panel  Order: [id number]  Status:  Final result Visible to patient:  No (Not Released) Next appt:  05/04/2016 at 08:25 AM in Cardiology ([facility] Device Remotes) Dx:  Chronic systolic CHF (congestive hear...  Notes Recorded by Suezanne Cheshire, RN on 03/06/2016 at 10:53 AM EST Left message to call back ------  Notes Recorded by Noralee Space, RN on 03/03/2016 at 4:24 PM EST Left message to call back ------  Notes Recorded by Modesta Messing, CMA on 02/29/2016 at 1:32 PM EST Left VM for pt to call for lab results. ------  Notes Recorded by Laurey Morale, MD on 02/27/2016 at 10:53 PM EST Follow low K diet, make sure no supplemental K. Repeat BMET in 2 wks.

## 2016-03-10 ENCOUNTER — Encounter (HOSPITAL_COMMUNITY): Payer: Self-pay | Admitting: *Deleted

## 2016-03-10 ENCOUNTER — Telehealth (HOSPITAL_COMMUNITY): Payer: Self-pay | Admitting: *Deleted

## 2016-03-10 DIAGNOSIS — E875 Hyperkalemia: Secondary | ICD-10-CM

## 2016-03-10 NOTE — Telephone Encounter (Signed)
Pt finally called back about lab results from 12/8, he states he is not on any K supp and does not eat a diet high in K, he is unable to come for repeat labs until 12/29, order placed appt sch

## 2016-03-17 ENCOUNTER — Ambulatory Visit (HOSPITAL_COMMUNITY)
Admission: RE | Admit: 2016-03-17 | Discharge: 2016-03-17 | Disposition: A | Discharge status: Home / Self Care | Payer: BLUE CROSS/BLUE SHIELD | Source: Ambulatory Visit | Attending: Cardiology | Admitting: Cardiology

## 2016-03-17 DIAGNOSIS — E875 Hyperkalemia: Secondary | ICD-10-CM | POA: Diagnosis present

## 2016-03-17 LAB — BASIC METABOLIC PANEL
Anion gap: 6 (ref 5–15)
BUN: 15 mg/dL (ref 6–20)
CO2: 24 mmol/L (ref 22–32)
Calcium: 9.1 mg/dL (ref 8.9–10.3)
Chloride: 110 mmol/L (ref 101–111)
Creatinine, Ser: 1.17 mg/dL (ref 0.61–1.24)
GFR calc Af Amer: >60 mL/min (ref >60)
GFR calc non Af Amer: >60 mL/min (ref >60)
Glucose, Bld: 97 mg/dL (ref 65–99)
Potassium: 5 mmol/L (ref 3.5–5.1)
Sodium: 140 mmol/L (ref 135–145)

## 2016-04-11 ENCOUNTER — Other Ambulatory Visit (HOSPITAL_COMMUNITY): Payer: Self-pay | Admitting: Adult Health

## 2016-04-24 ENCOUNTER — Other Ambulatory Visit (HOSPITAL_COMMUNITY): Payer: Self-pay | Admitting: *Deleted

## 2016-04-24 DIAGNOSIS — I472 Ventricular tachycardia, unspecified: Secondary | ICD-10-CM

## 2016-04-24 MED ORDER — CARVEDILOL 12.5 MG PO TABS: 18.7500 mg | ORAL_TABLET | Freq: Two times a day (BID) | ORAL | 3 refills | Status: DC

## 2016-05-04 ENCOUNTER — Ambulatory Visit (INDEPENDENT_AMBULATORY_CARE_PROVIDER_SITE_OTHER): Payer: BLUE CROSS/BLUE SHIELD | Admitting: *Deleted

## 2016-05-04 DIAGNOSIS — I469 Cardiac arrest, cause unspecified: Secondary | ICD-10-CM | POA: Diagnosis not present

## 2016-05-08 NOTE — Progress Notes (Signed)
Remote ICD transmission.   

## 2016-05-09 ENCOUNTER — Encounter: Payer: Self-pay | Admitting: Cardiology

## 2016-05-11 ENCOUNTER — Other Ambulatory Visit (HOSPITAL_COMMUNITY): Payer: Self-pay | Admitting: Cardiology

## 2016-05-11 DIAGNOSIS — I472 Ventricular tachycardia, unspecified: Secondary | ICD-10-CM

## 2016-05-11 MED ORDER — CARVEDILOL 12.5 MG PO TABS: 18.7500 mg | ORAL_TABLET | Freq: Two times a day (BID) | ORAL | 3 refills | Status: DC

## 2016-05-12 LAB — CUP PACEART REMOTE DEVICE CHECK
Battery Remaining Longevity: 130 mo
Battery Voltage: 3.03 V
Brady Statistic RV Percent Paced: 0.01 %
Date Time Interrogation Session: 20180216003826
HighPow Impedance: 76 Ohm
Implantable Lead Implant Date: 20160829
Implantable Lead Location: 753860
Implantable Pulse Generator Implant Date: 20160829
Lead Channel Impedance Value: 399 Ohm
Lead Channel Impedance Value: 494 Ohm
Lead Channel Pacing Threshold Amplitude: 0.625 V
Lead Channel Pacing Threshold Pulse Width: 0.4 ms
Lead Channel Sensing Intrinsic Amplitude: 20.5 mV
Lead Channel Sensing Intrinsic Amplitude: 20.5 mV
Lead Channel Setting Pacing Amplitude: 2 V
Lead Channel Setting Pacing Pulse Width: 0.4 ms
Lead Channel Setting Sensing Sensitivity: 0.3 mV

## 2016-05-19 ENCOUNTER — Encounter (HOSPITAL_COMMUNITY): Payer: Self-pay

## 2016-05-23 ENCOUNTER — Other Ambulatory Visit (HOSPITAL_COMMUNITY): Payer: Self-pay | Admitting: Cardiology

## 2016-05-23 MED ORDER — SPIRONOLACTONE 25 MG PO TABS: 25.0000 mg | ORAL_TABLET | Freq: Every day | ORAL | 11 refills | Status: DC

## 2016-06-20 ENCOUNTER — Other Ambulatory Visit (HOSPITAL_COMMUNITY): Payer: Self-pay | Admitting: Cardiology

## 2016-06-20 MED ORDER — SACUBITRIL-VALSARTAN 24-26 MG PO TABS: 1.0000 | ORAL_TABLET | Freq: Two times a day (BID) | ORAL | 3 refills | Status: DC

## 2016-08-10 NOTE — Progress Notes (Signed)
Patient Care Team: [name], MD as PCP - General (Family Medicine)   HPI  Brian Noble is a 56 y.o. male Seen in follow-up for aborted cardiac arrest in the context of nonischemic cardiomyopathy and hypokalemia. This was 8/16; he received an ICD.  He has an IVCD which in the past we decided against CRT   DATE TEST    11/16    Echo   EF 20 %   12/17    Echo   EF 30 %          Date Cr K Mg  12/17  1.17 5.0 2.1   The patient denies chest pain, shortness of breath, nocturnal dyspnea, orthopnea or peripheral edema.  There have been no palpitations, lightheadedness or syncope.   Tolerating meds without difficult;  however, he has noted breast tenderness.     Device History: ICD implanted8/16 for VT/VF History of appropriate therapy: No History of AAD therapy: No    Records and Results Reviewed hosp records  Past Medical History:  Diagnosis Date  . Arthritis   . Chronic kidney disease   . Chronic systolic CHF (congestive heart failure) (HCC)    a. Echo 8/16:  EF 10%, diff HK, Gr 1 DD, mild dilated aortic root, mild MR, mild LAE, severely reduced RVSF  . Hypertension   . ICD (implantable cardioverter-defibrillator) in place   . NICM (nonischemic cardiomyopathy) (HCC)    a. LHC 8/16:  normal coronary arteries.   . VF (ventricular fibrillation) (HCC)    s/p OOH cardiac arrest 8/16 >> s/p ICD    Past Surgical History:  Procedure Laterality Date  . APPENDECTOMY    . CARDIAC CATHETERIZATION N/A 11/10/2014   Procedure: Left Heart Cath and Coronary Angiography;  Surgeon: [name], MD;  Location: [facility] INVASIVE CV LAB;  Service: Cardiovascular;  Laterality: N/A;  . EP IMPLANTABLE DEVICE N/A 11/16/2014   Procedure: ICD Implant;  Surgeon: [name], MD;  Location: [facility] INVASIVE CV LAB;  Service: Cardiovascular;  Laterality: N/A;    Current Outpatient Prescriptions  Medication Sig Dispense Refill  . aspirin EC 81 MG tablet Take 81 mg by mouth  daily.    . carvedilol (COREG) 12.5 MG tablet Take 1.5 tablets (18.75 mg total) by mouth 2 (two) times daily. 180 tablet 3  . colchicine 0.6 MG tablet Take 0.6 mg by mouth daily as needed (for flare ups).    . furosemide (LASIX) 40 MG tablet Take 1 tablet (40 mg total) by mouth every other day. 30 tablet 0  . lovastatin (MEVACOR) 20 MG tablet Take 20 mg by mouth every morning.     . magnesium oxide (MAG-OX) 400 MG tablet Take 1 tablet (400 mg total) by mouth 2 (two) times daily. 60 tablet 0  . sacubitril-valsartan (ENTRESTO) 24-26 MG Take 1 tablet by mouth 2 (two) times daily. 60 tablet 3  . sildenafil (REVATIO) 20 MG tablet Take 1 tablet (20 mg total) by mouth daily as needed. For erectile dysfunction. 10 tablet 0  . spironolactone (ALDACTONE) 25 MG tablet Take 1 tablet (25 mg total) by mouth daily. 30 tablet 11   No current facility-administered medications for this visit.     No Known Allergies    Review of Systems negative except from HPI and PMH  Physical Exam BP 112/68   Pulse 73   Ht 5\' 9"  (1.753 m)   Wt 198 lb (89.8 kg)   SpO2 98%  BMI 29.24 kg/m  Well developed and nourished in no acute distress HENT normal Neck supple with JVP-flat Clear Regular rate and rhythm, no murmurs or gallops Abd-soft with active BS No Clubbing cyanosis edema Skin-warm and dry A & Oriented  Grossly normal sensory and motor function  ECG  Sinus 68  22/11/38 LVH QRS  Assessment and  Plan  Aborted cardiac arrest  Ventricular tachycardia-nonsustained  Nonischemic cardiomyopathy  IVCD  Gynecomastia and breast tenderness   High Risk Medication Surveillance  Implantable defibrillator-Medtronic  The patient's device was interrogated and the information was fully reviewed.  The device was reprogrammed to maximize longevity  Patient has had multiple episodes of nonsustained ventricular tachycardia and very rapid rates about 300 bpm.    regular Mondays there've been no significant  episodes over the last months. There is also been a significant diminution in his PVC burden which has been noted by his device as atrial fibrillation arrhythmias 3.  He is tolerating his medications. We will check his metabolic profile today on aldosterone antagonists. We will have to switch his spironolactone however because of gynecomastia  He is tolerating his other guidelines directed therapy.

## 2016-08-11 ENCOUNTER — Encounter: Payer: Self-pay | Admitting: Internal Medicine

## 2016-08-11 ENCOUNTER — Encounter (INDEPENDENT_AMBULATORY_CARE_PROVIDER_SITE_OTHER): Payer: Self-pay

## 2016-08-11 ENCOUNTER — Ambulatory Visit (INDEPENDENT_AMBULATORY_CARE_PROVIDER_SITE_OTHER): Payer: BLUE CROSS/BLUE SHIELD | Admitting: Internal Medicine

## 2016-08-11 VITALS — BP 112/68 | HR 73 | Ht 69.0 in | Wt 198.0 lb

## 2016-08-11 DIAGNOSIS — I4729 Other ventricular tachycardia: Secondary | ICD-10-CM

## 2016-08-11 DIAGNOSIS — I428 Other cardiomyopathies: Secondary | ICD-10-CM | POA: Diagnosis not present

## 2016-08-11 DIAGNOSIS — Z95810 Presence of automatic (implantable) cardiac defibrillator: Secondary | ICD-10-CM | POA: Diagnosis not present

## 2016-08-11 DIAGNOSIS — I472 Ventricular tachycardia: Secondary | ICD-10-CM | POA: Diagnosis not present

## 2016-08-11 DIAGNOSIS — I5022 Chronic systolic (congestive) heart failure: Secondary | ICD-10-CM | POA: Diagnosis not present

## 2016-08-11 DIAGNOSIS — I469 Cardiac arrest, cause unspecified: Secondary | ICD-10-CM | POA: Diagnosis not present

## 2016-08-11 LAB — CUP PACEART INCLINIC DEVICE CHECK
Battery Remaining Longevity: 128 mo
Battery Voltage: 3.03 V
Brady Statistic RV Percent Paced: 0.03 %
Date Time Interrogation Session: 20180525155351
HighPow Impedance: 71 Ohm
Implantable Lead Implant Date: 20160829
Implantable Lead Location: 753860
Implantable Pulse Generator Implant Date: 20160829
Lead Channel Impedance Value: 380 Ohm
Lead Channel Impedance Value: 456 Ohm
Lead Channel Pacing Threshold Amplitude: 0.75 V
Lead Channel Pacing Threshold Pulse Width: 0.4 ms
Lead Channel Sensing Intrinsic Amplitude: 24.5 mV
Lead Channel Setting Pacing Amplitude: 2 V
Lead Channel Setting Pacing Pulse Width: 0.4 ms
Lead Channel Setting Sensing Sensitivity: 0.3 mV

## 2016-08-11 MED ORDER — EPLERENONE 25 MG PO TABS: 25.0000 mg | ORAL_TABLET | Freq: Every day | ORAL | 6 refills | Status: DC

## 2016-08-11 NOTE — Patient Instructions (Signed)
Medication Instructions: - Your physician has recommended you make the following change in your medication:  1) Stop aldactone (spironolactone) 2) Start inspra (eplerenone) 25 mg- take one tablet by mouth once daily  Labwork: - Your physician recommends that you have lab work today: BMP  Procedures/Testing: - none ordered  Follow-Up: - Remote monitoring is used to monitor your Pacemaker of ICD from home. This monitoring reduces the number of office visits required to check your device to one time per year. It allows [place] to keep an eye on the functioning of your device to ensure it is working properly. You are scheduled for a device check from home on 11/13/16. You may send your transmission at any time that day. If you have a wireless device, the transmission will be sent automatically. After your physician reviews your transmission, you will receive a postcard with your next transmission date.  - Your physician wants you to follow-up in: 6 months with Dr. Shirlee Latch & 1 year with Dr. Graciela Husbands. You will receive a reminder letter in the mail two months in advance. If you don't receive a letter, please call our office to schedule the follow-up appointment.   Any Additional Special Instructions Will Be Listed Below (If Applicable).     If you need a refill on your cardiac medications before your next appointment, please call your pharmacy.

## 2016-08-12 LAB — BASIC METABOLIC PANEL
BUN/Creatinine Ratio: 14 (ref 9–20)
BUN: 16 mg/dL (ref 6–24)
CO2: 24 mmol/L (ref 18–29)
Calcium: 9.8 mg/dL (ref 8.7–10.2)
Chloride: 105 mmol/L (ref 96–106)
Creatinine, Ser: 1.18 mg/dL (ref 0.76–1.27)
GFR calc Af Amer: 79 mL/min/{1.73_m2} (ref >59)
GFR calc non Af Amer: 69 mL/min/{1.73_m2} (ref >59)
Glucose: 87 mg/dL (ref 65–99)
Potassium: 4.5 mmol/L (ref 3.5–5.2)
Sodium: 142 mmol/L (ref 134–144)

## 2016-08-15 ENCOUNTER — Telehealth: Payer: Self-pay

## 2016-08-15 NOTE — Telephone Encounter (Signed)
Pt is aware and agreeable to normal labs with improved potassium

## 2016-08-15 NOTE — Telephone Encounter (Signed)
Left message for patient to call back for lab results

## 2016-08-23 ENCOUNTER — Other Ambulatory Visit (HOSPITAL_COMMUNITY): Payer: Self-pay | Admitting: Cardiology

## 2016-08-23 MED ORDER — SILDENAFIL CITRATE 20 MG PO TABS: 20.0000 mg | ORAL_TABLET | Freq: Every day | ORAL | 0 refills | Status: DC | PRN

## 2016-08-23 MED ORDER — COLCHICINE 0.6 MG PO TABS: 0.6000 mg | ORAL_TABLET | Freq: Every day | ORAL | 0 refills | Status: DC | PRN

## 2016-09-01 ENCOUNTER — Telehealth (HOSPITAL_COMMUNITY): Payer: Self-pay | Admitting: Pharmacist

## 2016-09-01 NOTE — Telephone Encounter (Signed)
Patient stated that Sherryll Burger was $10 with copay card, now charging his $55. [facility] who stated his copay card was expired. He was given a new copay card when he was last in clinic but left a VM to call us if he has any further issues with cost.   Cicero Duck K. Bonnye Fava, PharmD, BCPS, CPP Clinical Pharmacist Pager: [phone] Phone: [phone] 09/01/2016 12:17 PM

## 2016-09-04 ENCOUNTER — Telehealth (HOSPITAL_COMMUNITY): Payer: Self-pay

## 2016-09-04 NOTE — Telephone Encounter (Signed)
Wife calling CHF clinic to request new copay card as theirs has expired.  Advised will leave new card at front desk to pick up today. No other questions/concerns at this time.  Ave Filter, RN

## 2016-09-27 IMAGING — CR DG CHEST 1V PORT
1 series · 1 of 1 positions shown · non-contrast
Comparison: Earlier today at 8832 hour

CLINICAL DATA: Hypoxia.

EXAM:
PORTABLE CHEST - 1 VIEW

[AP]
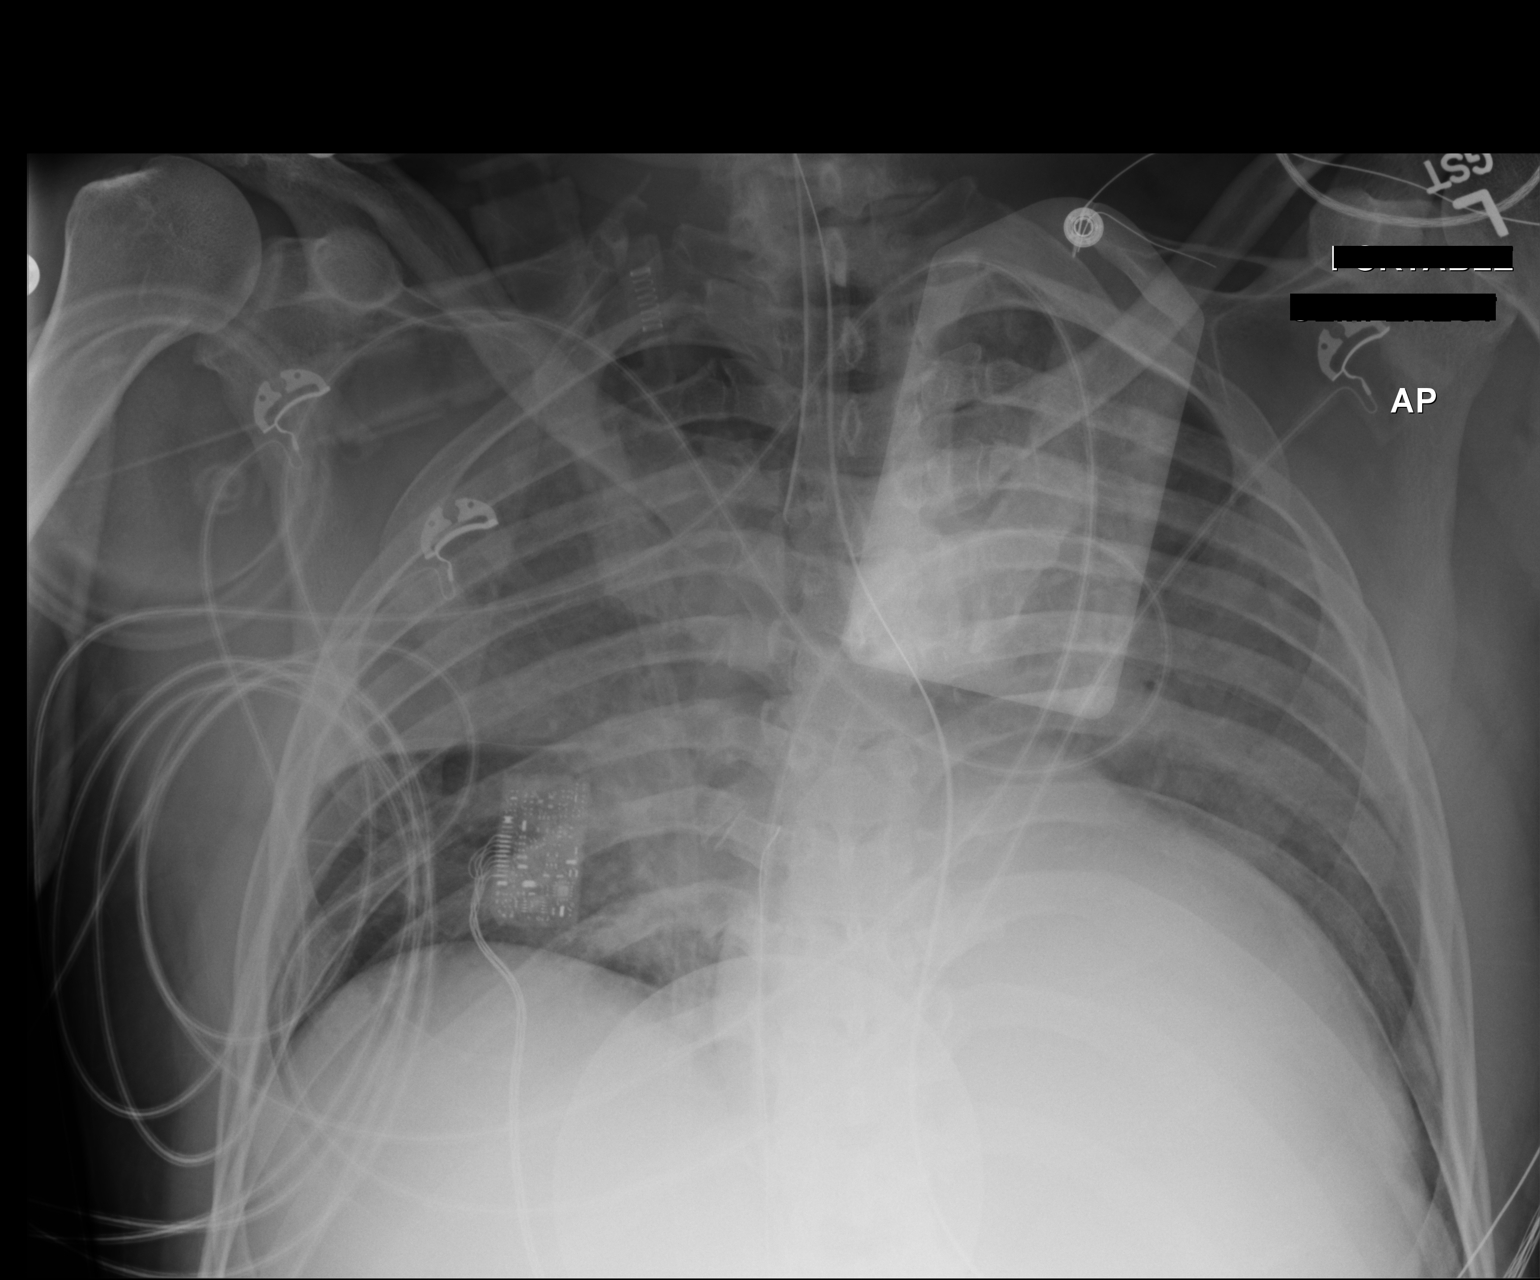

[1 of 1 positions shown; findings below may reference images not displayed]

FINDINGS: 3482 hour: Endotracheal tube is approximately 4.0 cm from the
carina. Enteric tube in place, tip and side port below the diaphragm
not included in the field of view.

Progressive opacity in the right upper lobe, now abutting the minor
fissure. Development of retrocardiac opacity and obscuration of the
hemidiaphragm. Increasing hazy opacity in the left midlung zone,
partially obscured. Cardiomediastinal contours are unchanged, heart
is mildly enlarged. Multiple overlying monitoring devices remain in
place.
IMPRESSION: 1. Development of retrocardiac opacity and increasing hazy opacity
in the left mid lung zone. This may reflect atelectasis or
developing pulmonary edema.
2. Increasing right upper lobe opacity, and now all abutting the
minor fissure. Pulmonary edema versus atelectasis again considered.

## 2016-09-27 IMAGING — CT CT HEAD W/O CM
1 series · 16 of 30 positions shown, 20 images · non-contrast
Comparison: None.

CLINICAL DATA: Left-sided weakness.  Post cardiac arrest and CPR.

EXAM:
CT HEAD WITHOUT CONTRAST
TECHNIQUE: Contiguous axial images were obtained from the base of the skull
through the vertex without intravenous contrast.

[Series 2: head 5.0 h30s · axial · 0.42mm/px · z∈[-129,+6]mm · 16 of 30 slices shown, 20 images]
[im 2/30  brain]
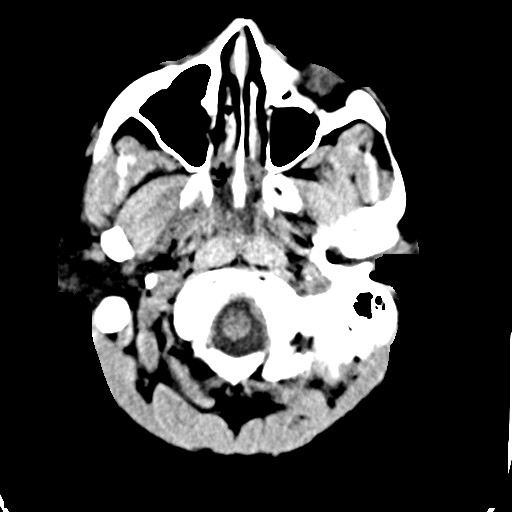
[im 2/30  bone]
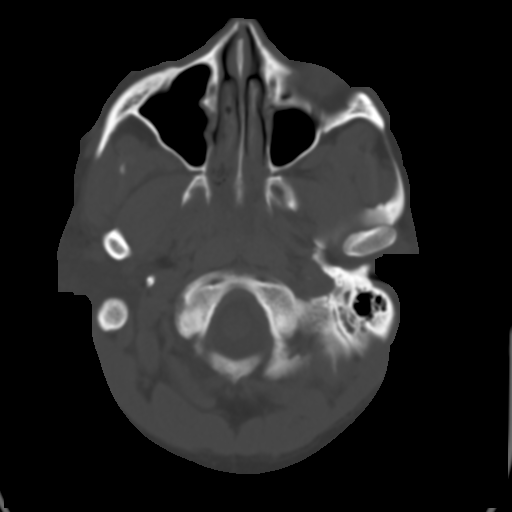
[im 4/30  brain]
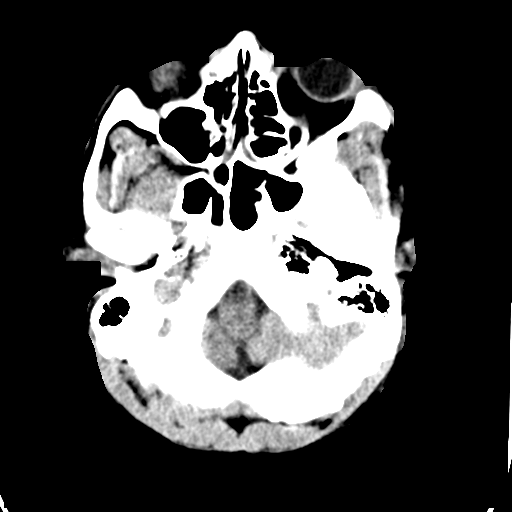
[im 6/30  brain]
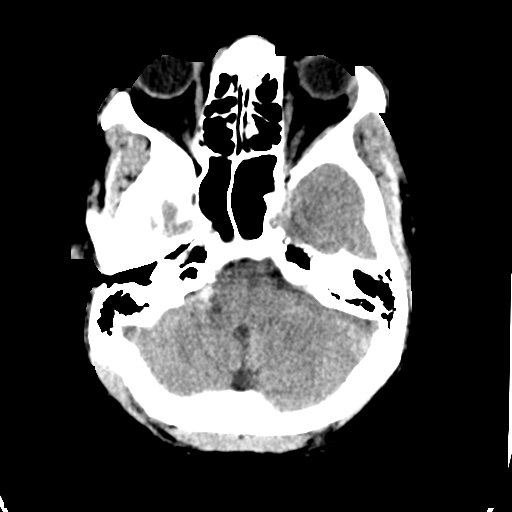
[im 8/30  brain]
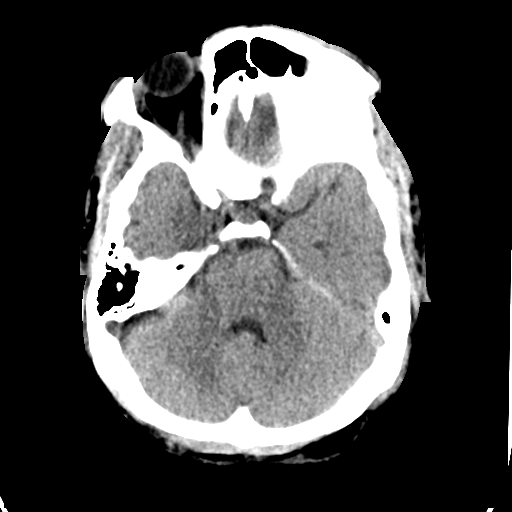
[im 9/30  brain]
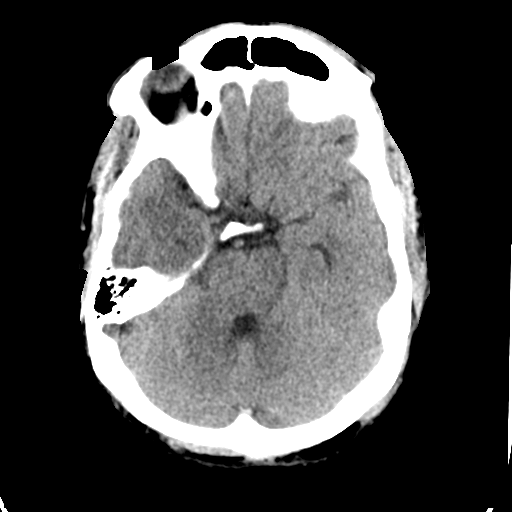
[im 9/30  bone]
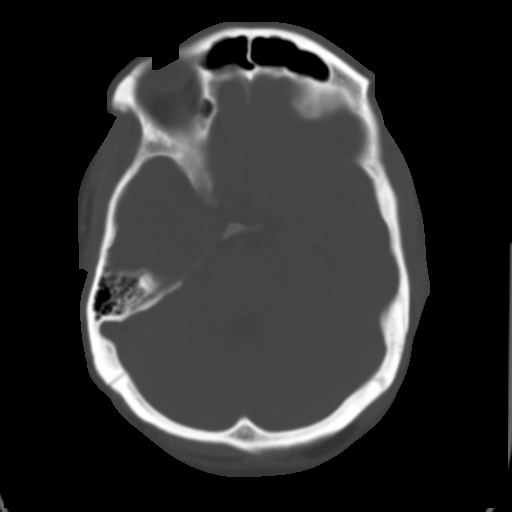
[im 11/30  brain]
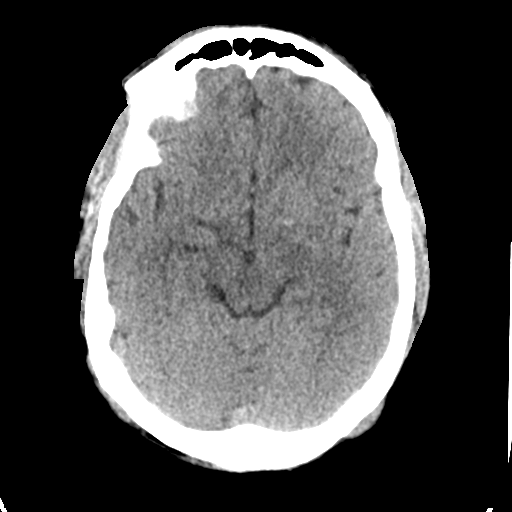
[im 13/30  brain]
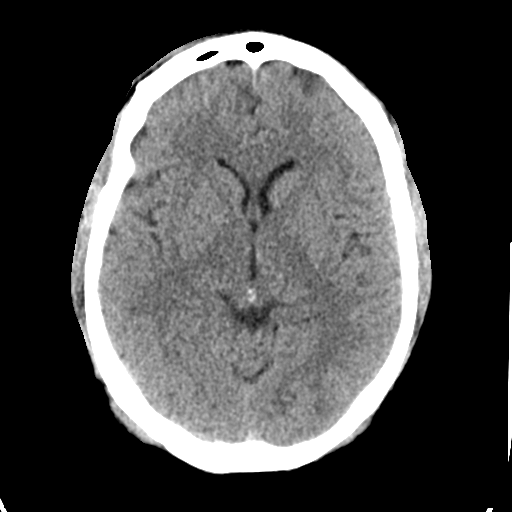
[im 15/30  brain]
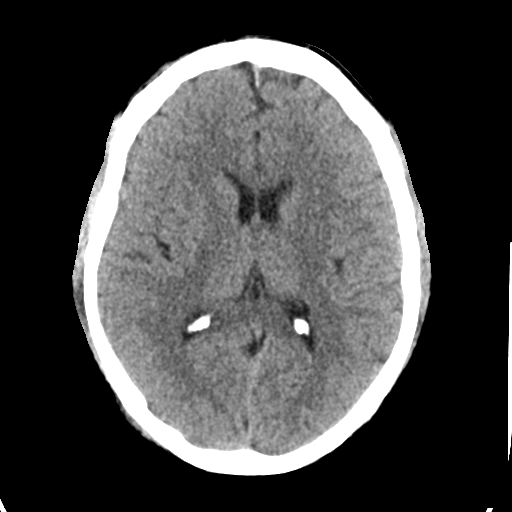
[im 16/30  brain]
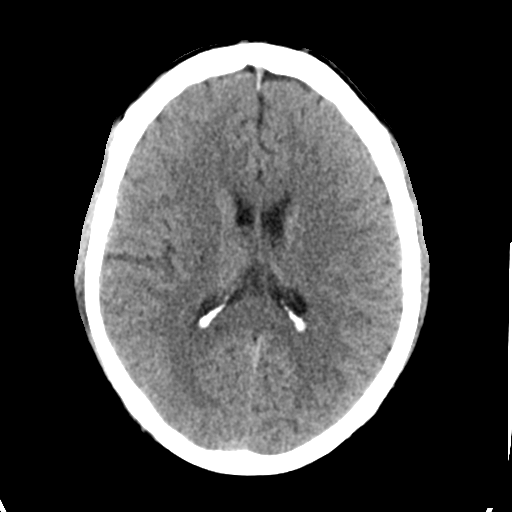
[im 16/30  bone]
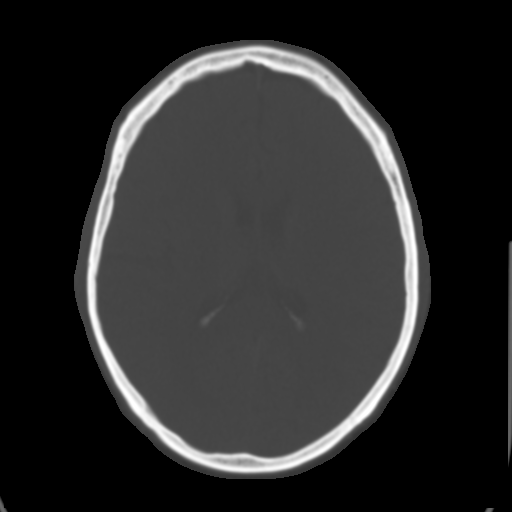
[im 18/30  brain]
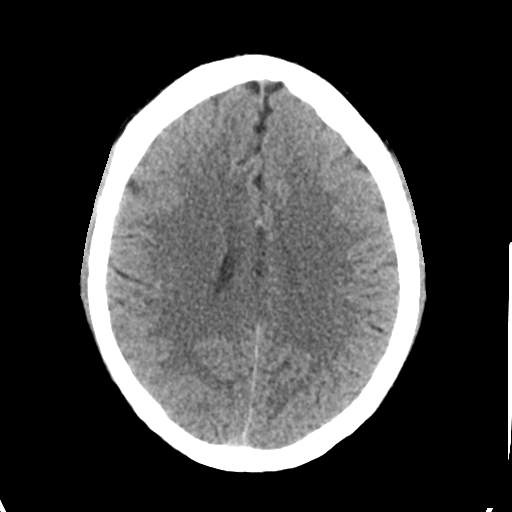
[im 20/30  brain]
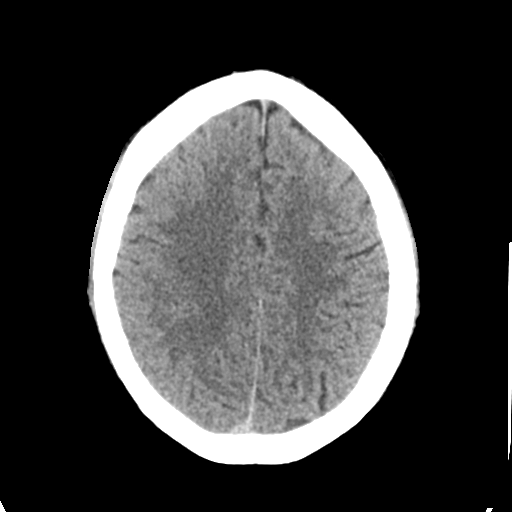
[im 22/30  brain]
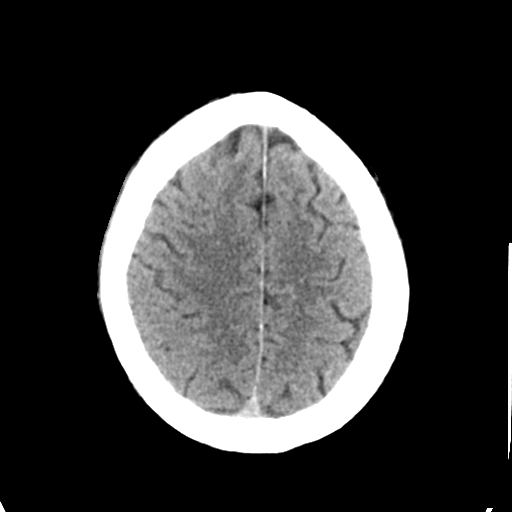
[im 23/30  brain]
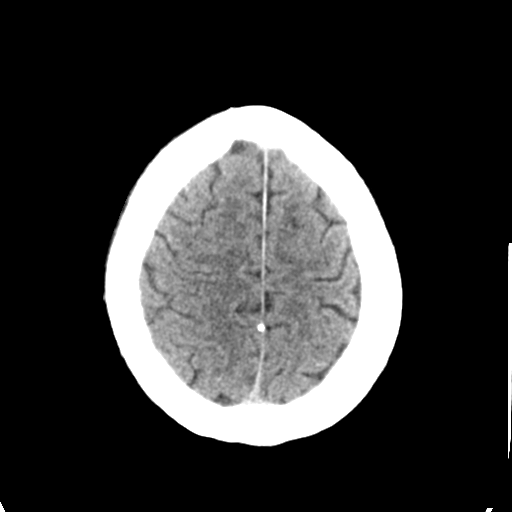
[im 23/30  bone]
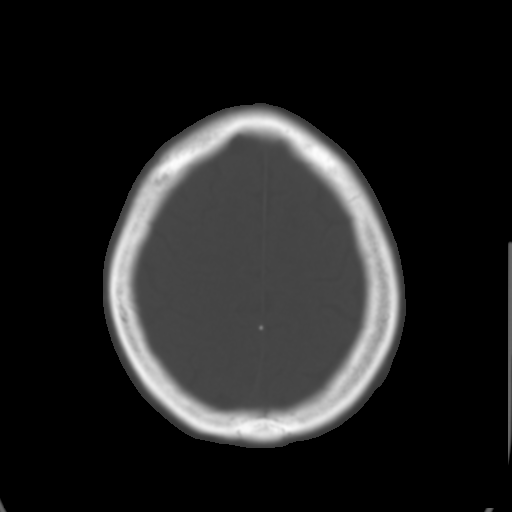
[im 25/30  brain]
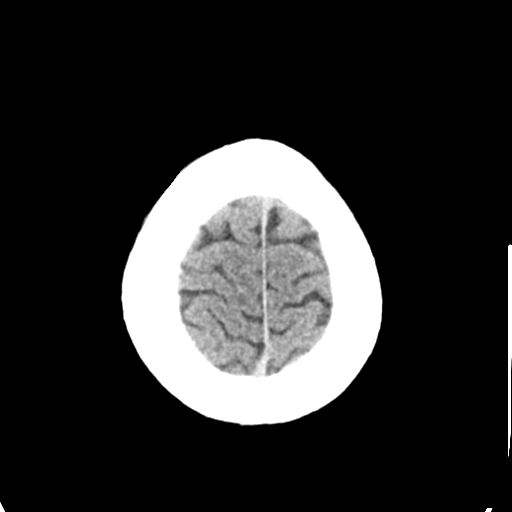
[im 27/30  brain]
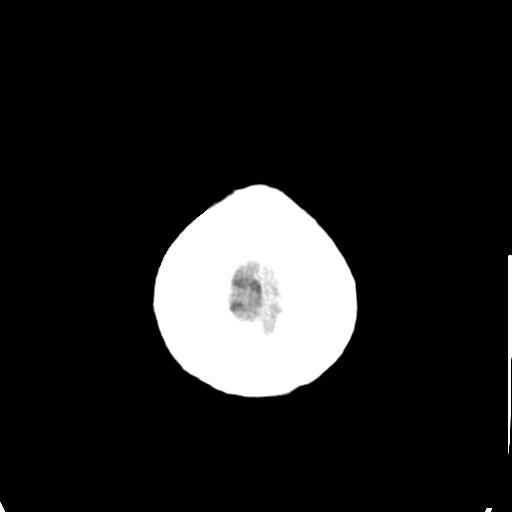
[im 29/30  brain]
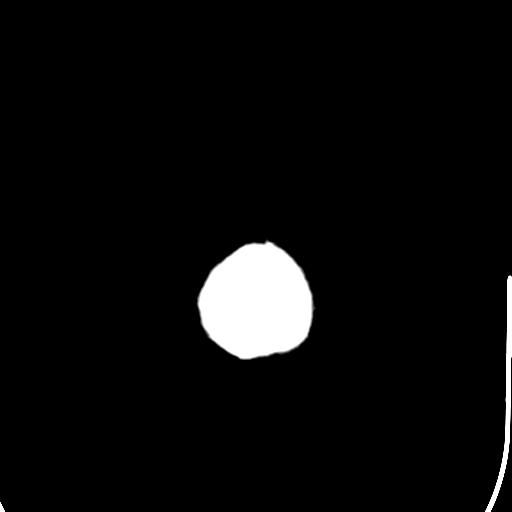

[16 of 30 positions shown; findings below may reference images not displayed]

FINDINGS: Questionable vague area decreased density in the anterior right
temporal lobe without mass effect. No intracranial hemorrhage or
midline shift. No hydrocephalus. The basilar cisterns are patent. No
cerebral edema. No intracranial fluid collection. Calvarium is
intact. Minimal opacification of ethmoid air cells and small fluid
levels in the sphenoid sinus, may be related to intubation. The
mastoid air cells are well aerated.
IMPRESSION: Questionable vague area decreased density in the anterior right
temporal lobe. This may reflect a region of acute or subacute
ischemia versus volume averaging. There is no hemorrhage or mass
effect.

These results were called by telephone at the time of interpretation
on 11/10/2014 at [DATE] to Dr. LAJUAN VILLARROEL , who verbally
acknowledged these results.

## 2016-09-29 IMAGING — CR DG CHEST 1V PORT
1 series · 1 of 1 positions shown · non-contrast
Comparison: Portable chest x-ray November 11, 2014

CLINICAL DATA: Respiratory failure, cardiac arrest, intubated
patient.

EXAM:
PORTABLE CHEST - 1 VIEW

[AP]
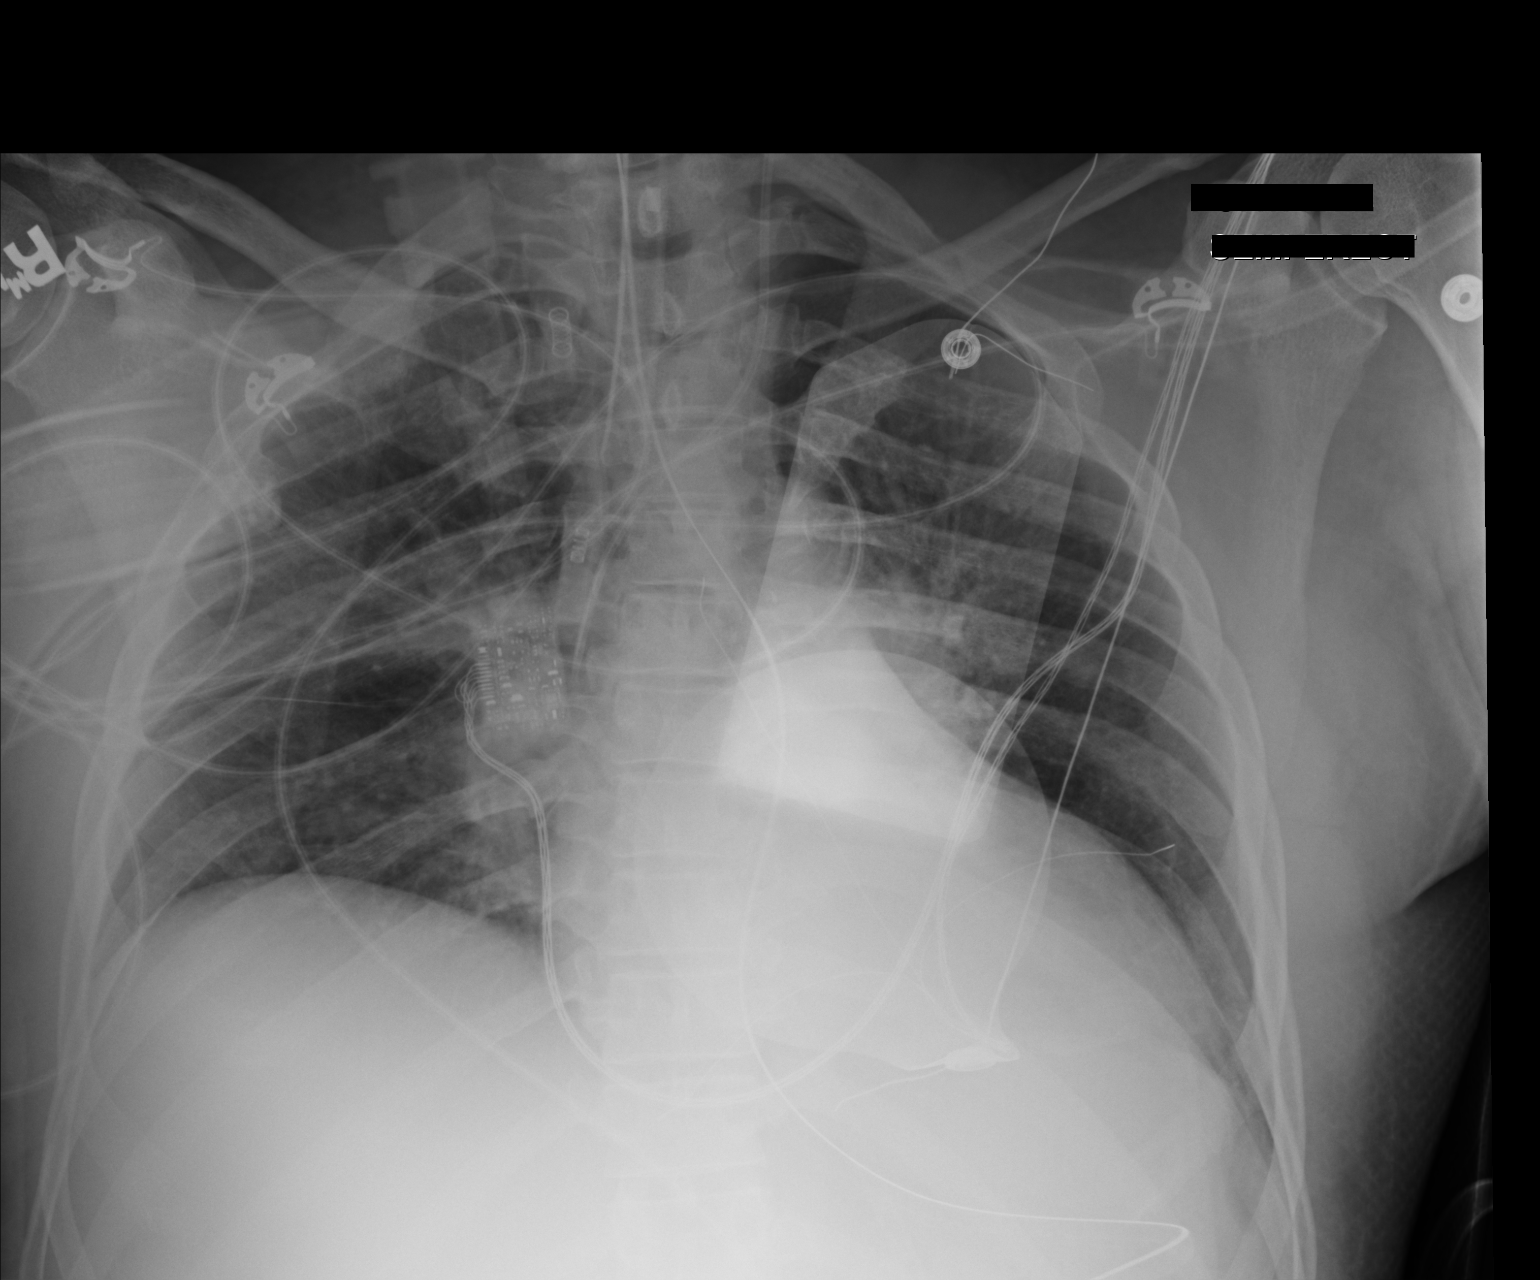

[1 of 1 positions shown; findings below may reference images not displayed]

FINDINGS: The lungs are reasonably well inflated. The right hemidiaphragm
remains higher than the left. The retrocardiac region on the left
remains dense. Interstitial density in the right upper lobe is less
conspicuous today. The cardiac silhouette is top-normal in size. The
central pulmonary vascularity is less engorged.

The endotracheal tube tip lies 4 cm above the carina. The
esophagogastric tube tip projects below the inferior margin of the
image. The left internal jugular venous catheter tip projects over
the junction of the proximal and midportions of the SVC.
IMPRESSION: Slight interval improvement in interstitial edema. Left lower lobe
atelectasis persists. The support tubes are in reasonable position.

## 2016-11-03 ENCOUNTER — Other Ambulatory Visit (HOSPITAL_COMMUNITY): Payer: Self-pay | Admitting: *Deleted

## 2016-11-08 ENCOUNTER — Other Ambulatory Visit (HOSPITAL_COMMUNITY): Payer: Self-pay | Admitting: *Deleted

## 2016-11-08 MED ORDER — SACUBITRIL-VALSARTAN 24-26 MG PO TABS: 1.0000 | ORAL_TABLET | Freq: Two times a day (BID) | ORAL | 6 refills | Status: DC

## 2016-11-13 ENCOUNTER — Ambulatory Visit (INDEPENDENT_AMBULATORY_CARE_PROVIDER_SITE_OTHER): Payer: BLUE CROSS/BLUE SHIELD | Admitting: *Deleted

## 2016-11-13 DIAGNOSIS — I469 Cardiac arrest, cause unspecified: Secondary | ICD-10-CM

## 2016-11-13 NOTE — Progress Notes (Signed)
Remote ICD transmission.   

## 2016-11-14 LAB — CUP PACEART REMOTE DEVICE CHECK
Battery Remaining Longevity: 126 mo
Battery Voltage: 3.03 V
Brady Statistic RV Percent Paced: 0.02 %
Date Time Interrogation Session: 20180827041603
HighPow Impedance: 68 Ohm
Implantable Lead Implant Date: 20160829
Implantable Lead Location: 753860
Implantable Pulse Generator Implant Date: 20160829
Lead Channel Impedance Value: 380 Ohm
Lead Channel Impedance Value: 456 Ohm
Lead Channel Pacing Threshold Amplitude: 0.625 V
Lead Channel Pacing Threshold Pulse Width: 0.4 ms
Lead Channel Sensing Intrinsic Amplitude: 21.375 mV
Lead Channel Sensing Intrinsic Amplitude: 21.375 mV
Lead Channel Setting Pacing Amplitude: 2 V
Lead Channel Setting Pacing Pulse Width: 0.4 ms
Lead Channel Setting Sensing Sensitivity: 0.3 mV

## 2016-11-24 ENCOUNTER — Encounter: Payer: Self-pay | Admitting: Cardiology

## 2016-12-07 ENCOUNTER — Telehealth (HOSPITAL_COMMUNITY): Payer: Self-pay | Admitting: Vascular Surgery

## 2016-12-07 NOTE — Telephone Encounter (Signed)
Left pt message, pt canceled appt through automated service called pt to get rescheduled

## 2016-12-11 ENCOUNTER — Encounter (HOSPITAL_COMMUNITY): Payer: Self-pay

## 2017-01-18 DIAGNOSIS — F5221 Male erectile disorder: Secondary | ICD-10-CM | POA: Diagnosis not present

## 2017-01-18 DIAGNOSIS — I5020 Unspecified systolic (congestive) heart failure: Secondary | ICD-10-CM | POA: Diagnosis not present

## 2017-01-18 DIAGNOSIS — I10 Essential (primary) hypertension: Secondary | ICD-10-CM | POA: Diagnosis not present

## 2017-01-18 DIAGNOSIS — E782 Mixed hyperlipidemia: Secondary | ICD-10-CM | POA: Diagnosis not present

## 2017-02-12 ENCOUNTER — Ambulatory Visit (INDEPENDENT_AMBULATORY_CARE_PROVIDER_SITE_OTHER): Payer: BLUE CROSS/BLUE SHIELD | Admitting: *Deleted

## 2017-02-12 DIAGNOSIS — I469 Cardiac arrest, cause unspecified: Secondary | ICD-10-CM | POA: Diagnosis not present

## 2017-02-12 NOTE — Progress Notes (Signed)
Remote ICD transmission.   

## 2017-02-14 LAB — CUP PACEART REMOTE DEVICE CHECK
Battery Remaining Longevity: 124 mo
Battery Voltage: 3.03 V
Brady Statistic RV Percent Paced: 0.01 %
Date Time Interrogation Session: 20181126072604
HighPow Impedance: 69 Ohm
Implantable Lead Implant Date: 20160829
Implantable Lead Location: 753860
Implantable Pulse Generator Implant Date: 20160829
Lead Channel Impedance Value: 399 Ohm
Lead Channel Impedance Value: 494 Ohm
Lead Channel Pacing Threshold Amplitude: 0.625 V
Lead Channel Pacing Threshold Pulse Width: 0.4 ms
Lead Channel Sensing Intrinsic Amplitude: 21.25 mV
Lead Channel Sensing Intrinsic Amplitude: 21.25 mV
Lead Channel Setting Pacing Amplitude: 2 V
Lead Channel Setting Pacing Pulse Width: 0.4 ms
Lead Channel Setting Sensing Sensitivity: 0.3 mV

## 2017-02-16 ENCOUNTER — Encounter: Payer: Self-pay | Admitting: Cardiology

## 2017-02-27 ENCOUNTER — Other Ambulatory Visit (HOSPITAL_COMMUNITY): Payer: Self-pay | Admitting: *Deleted

## 2017-02-27 DIAGNOSIS — I472 Ventricular tachycardia, unspecified: Secondary | ICD-10-CM

## 2017-02-27 MED ORDER — CARVEDILOL 12.5 MG PO TABS: 18.7500 mg | ORAL_TABLET | Freq: Two times a day (BID) | ORAL | 0 refills | Status: DC

## 2017-03-22 ENCOUNTER — Telehealth (HOSPITAL_COMMUNITY): Payer: Self-pay | Admitting: Cardiology

## 2017-03-22 ENCOUNTER — Encounter (HOSPITAL_COMMUNITY): Payer: Self-pay

## 2017-03-22 ENCOUNTER — Telehealth (HOSPITAL_COMMUNITY): Payer: Self-pay

## 2017-03-22 ENCOUNTER — Ambulatory Visit (HOSPITAL_COMMUNITY)
Admission: RE | Admit: 2017-03-22 | Discharge: 2017-03-22 | Disposition: A | Discharge status: Home / Self Care | Payer: BLUE CROSS/BLUE SHIELD | Source: Ambulatory Visit | Attending: Cardiology | Admitting: Cardiology

## 2017-03-22 VITALS — BP 132/82 | HR 65 | Wt 207.0 lb

## 2017-03-22 DIAGNOSIS — Z7982 Long term (current) use of aspirin: Secondary | ICD-10-CM | POA: Diagnosis not present

## 2017-03-22 DIAGNOSIS — I429 Cardiomyopathy, unspecified: Secondary | ICD-10-CM | POA: Diagnosis not present

## 2017-03-22 DIAGNOSIS — I110 Hypertensive heart disease with heart failure: Secondary | ICD-10-CM | POA: Insufficient documentation

## 2017-03-22 DIAGNOSIS — I428 Other cardiomyopathies: Secondary | ICD-10-CM | POA: Diagnosis not present

## 2017-03-22 DIAGNOSIS — R51 Headache: Secondary | ICD-10-CM | POA: Insufficient documentation

## 2017-03-22 DIAGNOSIS — Z8674 Personal history of sudden cardiac arrest: Secondary | ICD-10-CM | POA: Insufficient documentation

## 2017-03-22 DIAGNOSIS — I472 Ventricular tachycardia: Secondary | ICD-10-CM | POA: Diagnosis not present

## 2017-03-22 DIAGNOSIS — Z79899 Other long term (current) drug therapy: Secondary | ICD-10-CM | POA: Diagnosis not present

## 2017-03-22 DIAGNOSIS — I5022 Chronic systolic (congestive) heart failure: Secondary | ICD-10-CM | POA: Diagnosis not present

## 2017-03-22 DIAGNOSIS — E785 Hyperlipidemia, unspecified: Secondary | ICD-10-CM | POA: Insufficient documentation

## 2017-03-22 DIAGNOSIS — I4729 Other ventricular tachycardia: Secondary | ICD-10-CM

## 2017-03-22 DIAGNOSIS — Z8249 Family history of ischemic heart disease and other diseases of the circulatory system: Secondary | ICD-10-CM | POA: Insufficient documentation

## 2017-03-22 LAB — BASIC METABOLIC PANEL
Anion gap: 6 (ref 5–15)
BUN: 13 mg/dL (ref 6–20)
CO2: 27 mmol/L (ref 22–32)
Calcium: 9.3 mg/dL (ref 8.9–10.3)
Chloride: 105 mmol/L (ref 101–111)
Creatinine, Ser: 1.15 mg/dL (ref 0.61–1.24)
GFR calc Af Amer: >60 mL/min (ref >60)
GFR calc non Af Amer: >60 mL/min (ref >60)
Glucose, Bld: 92 mg/dL (ref 65–99)
Potassium: 5 mmol/L (ref 3.5–5.1)
Sodium: 138 mmol/L (ref 135–145)

## 2017-03-22 NOTE — Patient Instructions (Addendum)
Routine lab work today. Will notify you of abnormal results, otherwise no news is good news!  No changes to medication at this time.  Will schedule you for an echocardiogram at [facility]. Address: [address] ([address] Floor), [place], [place] [address]  Phone: [phone] Their office will call to schedule.  Follow up 1 year with Dr. Shirlee Latch. We will call you closer to this time, or you may call our office to schedule 1 month before you are due to be seen. Take all medication as prescribed the day of your appointment. Bring all medications with you to your appointment.  Do the following things EVERYDAY: 1) Weigh yourself in the morning before breakfast. Write it down and keep it in a log. 2) Take your medicines as prescribed 3) Eat low salt foods-Limit salt (sodium) to 2000 mg per day.  4) Stay as active as you can everyday 5) Limit all fluids for the day to less than 2 liters

## 2017-03-22 NOTE — Progress Notes (Addendum)
Patient ID: Brian Noble, male   DOB: [birth year], 57 y.o.   MRN: [id number]   PCP: Dr. Marina Goodell Cardiology: Dr Shirlee Latch EP Dr Graciela Husbands   41 yo with a prior history of cardiomyopathy who developed a cardiac arrest and was found to have low EF and normal coronaries.  Per patient, he was found to have low EF around 20% about 8 years ago.  He was seen by a cardiologist in [place] (he thinks), and EF went back to normal range.  He had no problems up until 8/16.  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  He was noted to pass out by family.  EMS was called and arrived quickly.  He was in either ventricular fibrillation or VT and was defibrillated.  He was cooled and sent for coronary angiography, which showed no significant coronary disease.  Echo showed EF 10% with severe RV dysfunction.  After recovery, he got a Medtronic ICD.  Repeat echo in 11/16 showed EF 20% with septal-lateral dyssynchrony, moderately decreased RV systolic function.  Unable to take Bidil due to severe headaches.    Today he returns for 1 year follow up. Overall feeling fine. Denies SOB/PND/Orthopnea. Appetite ok. No fever or chills. No ICDs shocks.  Continues to work full time. Taking all medications.   Optivol- No VT. No Afib. Activity 6 hours per day.   Labs (9/16): K 4.2, creatinine 1.04 Labs (01/04/2015) : K 4.6 Creatinine 1.09, SPEP negative Labs (11/16): K 5.5, creatinine 1.29 Labs (02/17/2015): K 5.0 creatinine 1.2 Labs (06/02/2015): K 4.6 Creatinine 1.24, uric acid 9.9 Labs (4/17): TSH normal Labs (5/17): K 4, creatinine 1.13, BNP 187, HCT 45.1 Labs (10/17): K 4.3, creatinine 1.37  PMH: 1. LBBB 2. HTN: x years 3. Hyperlipidemia 4. Chronic systolic CHF: Patient was told around 2008 that his EF was about 20%.  He says that it recovered back to normal.  He thinks he may have seen a cardiologist in  at that time. On 11/10/14, he was admitted after having ventricular fibrillation versus VT arrest and being  shocked by AED.   - LHC (8/16): Normal coronaries, EF < 25%. - Echo (8/16): EF 10%, diffuse hypokinesis, mild MR, severely decreased RV systolic function.  - Medtronic ICD (8/16).   - Echo (11/16): EF 20%, septal-lateral dyssynchrony, mildly dilated LV with mild LVH, moderately decreased RV systolic function.  - CPX (3/17): Peak VO2 23.1 (69% predicted peak VO2) VE/VCO2 slope: 32 OUES: 2.00 Peak RER: 1.11, Ventilatory Threshold: 17.7 (53% predicted or measured peak VO2) => Mild functional impairment. - Echo (12/17): EF 30%, mild LV dilation, septal-lateral dyssynchrony, mild to moderately decreased RV systolic function.  5. Event monitor (4/17): No atrial fibrillation.  Runs NSVT noted.    FH: Sister with heart transplant, father with cardiac arrest when about 39, 2 other sisters with "heart problems."  He has 10 siblings in all.   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  ROS: All systems reviewed and negative except as per HPI.   Current Outpatient Medications  Medication Sig Dispense Refill  . aspirin EC 81 MG tablet Take 81 mg by mouth daily.    . carvedilol (COREG) 12.5 MG tablet Take 1.5 tablets (18.75 mg total) by mouth 2 (two) times daily. 90 tablet 0  . colchicine 0.6 MG tablet Take 1 tablet (0.6 mg total) by mouth daily as needed (for flare ups). 15 tablet 0  . eplerenone (INSPRA)  25 MG tablet Take 1 tablet (25 mg total) by mouth daily. 30 tablet 6  . furosemide (LASIX) 40 MG tablet Take 1 tablet (40 mg total) by mouth every other day. 30 tablet 0  . lovastatin (MEVACOR) 20 MG tablet Take 20 mg by mouth every morning.     . magnesium oxide (MAG-OX) 400 MG tablet Take 1 tablet (400 mg total) by mouth 2 (two) times daily. 60 tablet 0  . sacubitril-valsartan (ENTRESTO) 24-26 MG Take 1 tablet by mouth 2 (two) times daily. 60 tablet 6  . sildenafil (REVATIO) 20 MG tablet Take 1 tablet (20 mg total) by mouth daily as needed. For erectile  dysfunction. 10 tablet 0   No current facility-administered medications for this encounter.    BP 132/82   Pulse 65   Wt 207 lb (93.9 kg)   SpO2 99%   BMI 30.57 kg/m   Filed Weights   03/22/17 1017  Weight: 207 lb (93.9 kg)    General:  Well appearing. No resp difficulty HEENT: normal Neck: supple. no JVD. Carotids 2+ bilat; no bruits. No lymphadenopathy or thryomegaly appreciated. Cor: PMI nondisplaced. Regular rate & rhythm. No rubs, gallops or murmurs. Lungs: clear Abdomen: soft, nontender, nondistended. No hepatosplenomegaly. No bruits or masses. Good bowel sounds. Extremities: no cyanosis, clubbing, rash, edema Neuro: alert & orientedx3, cranial nerves grossly intact. moves all 4 extremities w/o difficulty. Affect pleasant   Assessment/Plan: 1. Chronic systolic CHF: Nonischemic cardiomyopathy.  Medtronic ICD.  Possibly familial CMP given father with SCD at 88, sister with heart transplant, and 2 other sisters with "heart problems."  Cannot rule out prior viral myocarditis or role for HTN. Echo 12/17 with EF 30%, septal-lateral dyssynchrony.  CPX in 3/17 with mild functional impairment.   NYHA I. Volume status stable. Continue lasix 40 mg daily.  - Continue Coreg 18.75 mg bid, unable to tolerate increase.    - Continue Entresto back to 24/26 bid.  Intolerant higher dose due to fatigue.  - Unable to tolerate Bidil due to headaches.  -Continue inspra 25 mg daily.  - Set up for repeat ECHO.  - Given possible familial CMP, recommended that [name] siblings all be screened by echo.  He has 10 siblings. - He has septal-lateral dyssynchrony on echo but QRS not particularly wide.  Does not definitely fit criteria for CRT.  2. Cardiac arrest: Now has Medtronic ICD. Has follow up with Dr Graciela Husbands.  3. NSVT:  Followed by EP. No VT on device interrogation.     Follow up in 1 year. Check BMET today.     Rolen Conger NP-C  03/22/2017

## 2017-03-22 NOTE — Addendum Note (Signed)
Encounter addended by: Sherald Hess, NP on: 03/22/2017 10:54 AM  Actions taken: Sign clinical note

## 2017-03-22 NOTE — Telephone Encounter (Signed)
CHF Clinic appointment reminder call placed to patient for upcoming post-hospital follow up.  LVMTCB to confirm appt  Patient also reminded to take all medications as prescribed on the day of his/her appointment and to bring all medications to this appointment.  Advised to call our office for tardiness or cancellations/rescheduling needs.  .Tamica Covell Genevea  

## 2017-03-23 ENCOUNTER — Other Ambulatory Visit: Payer: Self-pay | Admitting: Internal Medicine

## 2017-03-23 MED ORDER — EPLERENONE 25 MG PO TABS: 25.0000 mg | ORAL_TABLET | Freq: Every day | ORAL | 5 refills | Status: DC

## 2017-03-28 NOTE — Telephone Encounter (Signed)
User: Trina Ao A Date/time: 03/26/17 9:07 AM  Comment: Called pt and lmsg for him to CB to get scheduled for an echo.   Context:  Outcome: Left Message  Phone number: [phone] Phone Type: Home Phone  Comm. type: Telephone Call type: Outgoing  Contact: Lula Olszewski L Relation to patient: Self    User: Trina Ao A Date/time: 03/22/17 2:34 PM  Comment: Called pt and lmsg for him to CB to get him scheduled for an echo   Context:  Outcome: Left Message  Phone number: [phone] Phone Type: Home Phone  Comm. type: Telephone Call type: Outgoing  Contact: Lula Olszewski L Relation to patient: Self

## 2017-04-16 ENCOUNTER — Telehealth (HOSPITAL_COMMUNITY): Payer: Self-pay

## 2017-04-16 NOTE — Telephone Encounter (Signed)
Advanced Heart Failure Triage Encounter  Patient Name: Brian Noble  Date of Call: 04/16/17  Problem:  Pt wife called b/c pt is getting dental work that may require being put to sleep. Wanted to verify that is okay.   Plan:  Per Amy NP who spoke with Dr. Shirlee Latch. It is okay and no further orders at this time. Pt wife aware and no questions at this time.   Teresa Coombs, RN

## 2017-04-23 ENCOUNTER — Other Ambulatory Visit: Payer: Self-pay

## 2017-04-23 ENCOUNTER — Ambulatory Visit (HOSPITAL_COMMUNITY): Payer: BLUE CROSS/BLUE SHIELD | Attending: Cardiovascular Disease

## 2017-04-23 DIAGNOSIS — Z8674 Personal history of sudden cardiac arrest: Secondary | ICD-10-CM | POA: Insufficient documentation

## 2017-04-23 DIAGNOSIS — I428 Other cardiomyopathies: Secondary | ICD-10-CM | POA: Insufficient documentation

## 2017-04-23 DIAGNOSIS — Z95810 Presence of automatic (implantable) cardiac defibrillator: Secondary | ICD-10-CM | POA: Insufficient documentation

## 2017-04-23 DIAGNOSIS — I5022 Chronic systolic (congestive) heart failure: Secondary | ICD-10-CM | POA: Diagnosis not present

## 2017-04-23 DIAGNOSIS — I071 Rheumatic tricuspid insufficiency: Secondary | ICD-10-CM | POA: Insufficient documentation

## 2017-04-24 ENCOUNTER — Other Ambulatory Visit: Payer: Self-pay | Admitting: Cardiology

## 2017-04-24 ENCOUNTER — Telehealth (HOSPITAL_COMMUNITY): Payer: Self-pay

## 2017-04-24 MED ORDER — EPLERENONE 25 MG PO TABS: 25.0000 mg | ORAL_TABLET | Freq: Every day | ORAL | 3 refills | Status: DC

## 2017-04-24 NOTE — Telephone Encounter (Signed)
Result Notes for ECHOCARDIOGRAM COMPLETE   Notes recorded by Chyrl Civatte, RN on 04/24/2017 at 3:47 PM EST Patient aware and appreciative, refills sent for eplerenone per [facility] request [facility] electronically ------  Notes recorded by Sherald Hess, NP on 04/24/2017 at 9:34 AM EST Please call.. EF unchanged. 30-35%. No change.

## 2017-04-26 NOTE — Telephone Encounter (Signed)
Ok for refills? Sildenafil and colchicine  Both are prn

## 2017-04-27 ENCOUNTER — Other Ambulatory Visit: Payer: Self-pay | Admitting: Internal Medicine

## 2017-05-14 ENCOUNTER — Ambulatory Visit (INDEPENDENT_AMBULATORY_CARE_PROVIDER_SITE_OTHER): Payer: BLUE CROSS/BLUE SHIELD | Admitting: *Deleted

## 2017-05-14 ENCOUNTER — Telehealth: Payer: Self-pay | Admitting: Cardiology

## 2017-05-14 DIAGNOSIS — I428 Other cardiomyopathies: Secondary | ICD-10-CM | POA: Diagnosis not present

## 2017-05-14 NOTE — Telephone Encounter (Signed)
LMOVM reminding pt to send remote transmission.   

## 2017-05-15 NOTE — Progress Notes (Signed)
Remote pacemaker transmission.   

## 2017-05-17 ENCOUNTER — Encounter: Payer: Self-pay | Admitting: Cardiology

## 2017-05-29 LAB — CUP PACEART REMOTE DEVICE CHECK
Battery Remaining Longevity: 121 mo
Battery Voltage: 3.01 V
Brady Statistic RV Percent Paced: 0.06 %
Date Time Interrogation Session: 20190226003725
HighPow Impedance: 67 Ohm
Implantable Lead Implant Date: 20160829
Implantable Lead Location: 753860
Implantable Pulse Generator Implant Date: 20160829
Lead Channel Impedance Value: 399 Ohm
Lead Channel Impedance Value: 494 Ohm
Lead Channel Pacing Threshold Amplitude: 0.625 V
Lead Channel Pacing Threshold Pulse Width: 0.4 ms
Lead Channel Sensing Intrinsic Amplitude: 19.5 mV
Lead Channel Sensing Intrinsic Amplitude: 19.5 mV
Lead Channel Setting Pacing Amplitude: 2 V
Lead Channel Setting Pacing Pulse Width: 0.4 ms
Lead Channel Setting Sensing Sensitivity: 0.3 mV

## 2017-06-15 ENCOUNTER — Other Ambulatory Visit (HOSPITAL_COMMUNITY): Payer: Self-pay | Admitting: *Deleted

## 2017-06-15 MED ORDER — SACUBITRIL-VALSARTAN 24-26 MG PO TABS: 1.0000 | ORAL_TABLET | Freq: Two times a day (BID) | ORAL | 3 refills | Status: DC

## 2017-07-24 ENCOUNTER — Other Ambulatory Visit (HOSPITAL_COMMUNITY): Payer: Self-pay | Admitting: *Deleted

## 2017-07-24 MED ORDER — SILDENAFIL CITRATE 20 MG PO TABS: ORAL_TABLET | ORAL | 1 refills | Status: DC

## 2017-08-14 ENCOUNTER — Ambulatory Visit (INDEPENDENT_AMBULATORY_CARE_PROVIDER_SITE_OTHER): Payer: BLUE CROSS/BLUE SHIELD | Admitting: *Deleted

## 2017-08-14 DIAGNOSIS — I469 Cardiac arrest, cause unspecified: Secondary | ICD-10-CM

## 2017-08-15 NOTE — Progress Notes (Signed)
Remote ICD transmission.   

## 2017-08-16 LAB — CUP PACEART REMOTE DEVICE CHECK
Battery Remaining Longevity: 119 mo
Battery Voltage: 3.01 V
Brady Statistic RV Percent Paced: 0.1 %
Date Time Interrogation Session: 20190529013625
HighPow Impedance: 65 Ohm
Implantable Lead Implant Date: 20160829
Implantable Lead Location: 753860
Implantable Pulse Generator Implant Date: 20160829
Lead Channel Impedance Value: 342 Ohm
Lead Channel Impedance Value: 456 Ohm
Lead Channel Pacing Threshold Amplitude: 0.625 V
Lead Channel Pacing Threshold Pulse Width: 0.4 ms
Lead Channel Sensing Intrinsic Amplitude: 27.75 mV
Lead Channel Sensing Intrinsic Amplitude: 27.75 mV
Lead Channel Setting Pacing Amplitude: 2 V
Lead Channel Setting Pacing Pulse Width: 0.4 ms
Lead Channel Setting Sensing Sensitivity: 0.3 mV

## 2017-08-17 ENCOUNTER — Encounter: Payer: Self-pay | Admitting: Cardiology

## 2017-10-02 ENCOUNTER — Other Ambulatory Visit: Payer: Self-pay | Admitting: Internal Medicine

## 2017-10-22 ENCOUNTER — Other Ambulatory Visit (HOSPITAL_COMMUNITY): Payer: Self-pay | Admitting: Internal Medicine

## 2017-10-29 DIAGNOSIS — H35373 Puckering of macula, bilateral: Secondary | ICD-10-CM | POA: Diagnosis not present

## 2017-10-29 DIAGNOSIS — G43B0 Ophthalmoplegic migraine, not intractable: Secondary | ICD-10-CM | POA: Diagnosis not present

## 2017-11-13 ENCOUNTER — Telehealth: Payer: Self-pay | Admitting: Cardiology

## 2017-11-13 ENCOUNTER — Ambulatory Visit (INDEPENDENT_AMBULATORY_CARE_PROVIDER_SITE_OTHER): Payer: BLUE CROSS/BLUE SHIELD | Admitting: *Deleted

## 2017-11-13 DIAGNOSIS — I469 Cardiac arrest, cause unspecified: Secondary | ICD-10-CM | POA: Diagnosis not present

## 2017-11-13 NOTE — Telephone Encounter (Signed)
LMOVM reminding pt to send remote transmission.   

## 2017-11-14 NOTE — Progress Notes (Signed)
Remote ICD transmission.   

## 2017-11-15 ENCOUNTER — Encounter: Payer: Self-pay | Admitting: Cardiology

## 2017-12-04 LAB — CUP PACEART REMOTE DEVICE CHECK
Battery Remaining Longevity: 116 mo
Battery Voltage: 3 V
Brady Statistic RV Percent Paced: 0.07 %
Date Time Interrogation Session: 20190828004158
HighPow Impedance: 74 Ohm
Implantable Lead Implant Date: 20160829
Implantable Lead Location: 753860
Implantable Pulse Generator Implant Date: 20160829
Lead Channel Impedance Value: 399 Ohm
Lead Channel Impedance Value: 456 Ohm
Lead Channel Pacing Threshold Amplitude: 0.625 V
Lead Channel Pacing Threshold Pulse Width: 0.4 ms
Lead Channel Sensing Intrinsic Amplitude: 27 mV
Lead Channel Sensing Intrinsic Amplitude: 27 mV
Lead Channel Setting Pacing Amplitude: 2 V
Lead Channel Setting Pacing Pulse Width: 0.4 ms
Lead Channel Setting Sensing Sensitivity: 0.3 mV

## 2017-12-19 ENCOUNTER — Emergency Department (HOSPITAL_COMMUNITY): Payer: BLUE CROSS/BLUE SHIELD

## 2017-12-19 ENCOUNTER — Observation Stay (HOSPITAL_COMMUNITY)
Admission: EM | Admit: 2017-12-19 | Discharge: 2017-12-20 | DRG: 309 | Disposition: A | Discharge status: Home / Self Care | Payer: BLUE CROSS/BLUE SHIELD | Attending: Cardiovascular Disease | Admitting: Cardiovascular Disease

## 2017-12-19 ENCOUNTER — Encounter (HOSPITAL_COMMUNITY): Payer: Self-pay | Admitting: Emergency Medicine

## 2017-12-19 ENCOUNTER — Other Ambulatory Visit: Payer: Self-pay

## 2017-12-19 DIAGNOSIS — I493 Ventricular premature depolarization: Secondary | ICD-10-CM | POA: Diagnosis not present

## 2017-12-19 DIAGNOSIS — I5022 Chronic systolic (congestive) heart failure: Secondary | ICD-10-CM | POA: Diagnosis not present

## 2017-12-19 DIAGNOSIS — Z79899 Other long term (current) drug therapy: Secondary | ICD-10-CM

## 2017-12-19 DIAGNOSIS — N189 Chronic kidney disease, unspecified: Secondary | ICD-10-CM | POA: Diagnosis present

## 2017-12-19 DIAGNOSIS — Z8249 Family history of ischemic heart disease and other diseases of the circulatory system: Secondary | ICD-10-CM | POA: Diagnosis not present

## 2017-12-19 DIAGNOSIS — I4901 Ventricular fibrillation: Secondary | ICD-10-CM | POA: Diagnosis not present

## 2017-12-19 DIAGNOSIS — I130 Hypertensive heart and chronic kidney disease with heart failure and stage 1 through stage 4 chronic kidney disease, or unspecified chronic kidney disease: Secondary | ICD-10-CM | POA: Diagnosis not present

## 2017-12-19 DIAGNOSIS — M1990 Unspecified osteoarthritis, unspecified site: Secondary | ICD-10-CM | POA: Diagnosis present

## 2017-12-19 DIAGNOSIS — I517 Cardiomegaly: Secondary | ICD-10-CM | POA: Diagnosis not present

## 2017-12-19 DIAGNOSIS — Z7982 Long term (current) use of aspirin: Secondary | ICD-10-CM | POA: Diagnosis not present

## 2017-12-19 DIAGNOSIS — I428 Other cardiomyopathies: Secondary | ICD-10-CM | POA: Diagnosis not present

## 2017-12-19 DIAGNOSIS — Z95810 Presence of automatic (implantable) cardiac defibrillator: Secondary | ICD-10-CM | POA: Diagnosis not present

## 2017-12-19 DIAGNOSIS — Z8674 Personal history of sudden cardiac arrest: Secondary | ICD-10-CM | POA: Diagnosis not present

## 2017-12-19 DIAGNOSIS — Z4502 Encounter for adjustment and management of automatic implantable cardiac defibrillator: Secondary | ICD-10-CM

## 2017-12-19 DIAGNOSIS — I472 Ventricular tachycardia, unspecified: Secondary | ICD-10-CM

## 2017-12-19 LAB — I-STAT TROPONIN, ED: Troponin i, poc: 0.01 ng/mL (ref 0.00–0.08)

## 2017-12-19 NOTE — ED Provider Notes (Signed)
[facility] EMERGENCY DEPARTMENT Provider Note   CSN: [id number] Arrival date & time: 12/19/17  2043     History   Chief Complaint Chief Complaint  Patient presents with  . Defibrillator went off    HPI Brian Noble is a 57 y.o. male.  Patient presents to the emergency department after his defibrillator shocked him this evening.  Patient reports that he has never been shocked before.  He was feeling well when the defibrillator went off.  He had not been ill today, denies any episodes of shortness of breath, palpitations or chest pain.  He has not noticed any extremity swelling.  She reports that he is at his current baseline, without symptoms at arrival to the ER.     Past Medical History:  Diagnosis Date  . Arthritis   . Chronic kidney disease   . Chronic systolic CHF (congestive heart failure) (HCC)    a. Echo 8/16:  EF 10%, diff HK, Gr 1 DD, mild dilated aortic root, mild MR, mild LAE, severely reduced RVSF  . Hypertension   . ICD (implantable cardioverter-defibrillator) in place   . NICM (nonischemic cardiomyopathy) (HCC)    a. LHC 8/16:  normal coronary arteries.   . VF (ventricular fibrillation) (HCC)    s/p OOH cardiac arrest 8/16 >> s/p ICD    Patient Active Problem List   Diagnosis Date Noted  . NSVT (nonsustained ventricular tachycardia) (HCC) 06/02/2015  . Toe pain, left 06/02/2015  . Chronic systolic CHF (congestive heart failure) (HCC) 12/27/2014  . AKI (acute kidney injury) (HCC) 11/25/2014  . Paroxysmal ventricular tachycardia (HCC) 11/17/2014  . Nonischemic cardiomyopathy (HCC) 11/17/2014  . Acute on chronic systolic heart failure, EF 10% 11/17/2014  . Hypokalemia 11/17/2014  . Hypomagnesemia 11/17/2014  . Aspiration pneumonia (HCC)   . Central line clotted [facility])   . Hypoxia   . Cardiogenic shock (HCC)   . Sudden cardiac death (HCC) 12/04/2014  . Cardiac arrest (HCC) 2014-12-04  . Acute respiratory failure with hypoxia (HCC)  Dec 04, 2014    Past Surgical History:  Procedure Laterality Date  . APPENDECTOMY    . CARDIAC CATHETERIZATION N/A 12-04-2014   Procedure: Left Heart Cath and Coronary Angiography;  Surgeon: [name], MD;  Location: [facility] INVASIVE CV LAB;  Service: Cardiovascular;  Laterality: N/A;  . EP IMPLANTABLE DEVICE N/A 11/16/2014   Procedure: ICD Implant;  Surgeon: [name], MD;  Location: [facility] INVASIVE CV LAB;  Service: Cardiovascular;  Laterality: N/A;        Home Medications    Prior to Admission medications   Medication Sig Start Date End Date Taking? Authorizing Provider  aspirin EC 81 MG tablet Take 81 mg by mouth daily.   Yes [provider]  carvedilol (COREG) 12.5 MG tablet Take 1.5 tablets (18.75 mg total) by mouth 2 (two) times daily. 02/27/17  Yes Laurey Morale, MD  colchicine 0.6 MG tablet TAKE ONE TABLET BY MOUTH ONCE DAILY AS NEEDED FOR FLARE UPS Patient taking differently: Take 0.6 mg by mouth daily as needed (for gout).  04/26/17  Yes Laurey Morale, MD  ENTRESTO 24-26 MG TAKE ONE TABLET BY MOUTH TWO TIMES DAILY Patient taking differently: Take 1 tablet by mouth 2 (two) times daily.  10/24/17  Yes Bensimhon, Bevelyn Buckles, MD  eplerenone (INSPRA) 25 MG tablet TAKE ONE TABLET BY MOUTH ONCE DAILY Patient taking differently: Take 25 mg by mouth daily.  10/03/17  Yes [name], MD  furosemide (  LASIX) 40 MG tablet Take 1 tablet (40 mg total) by mouth every other day. 06/02/15  Yes Clegg, Amy D, NP  lovastatin (MEVACOR) 20 MG tablet Take 20 mg by mouth daily.    Yes [provider]  magnesium oxide (MAG-OX) 400 MG tablet Take 1 tablet (400 mg total) by mouth 2 (two) times daily. 11/17/14  Yes Short, Thea Silversmith, MD  eplerenone (INSPRA) 25 MG tablet Take 1 tablet (25 mg total) by mouth daily. Please make yearly appt with Dr. Graciela Husbands for May before anymore refills. 1st attempt Patient not taking: Reported on 12/19/2017 04/24/17   Tonye Becket D, NP  sildenafil (REVATIO)  20 MG tablet TAKE ONE-HALF (1/2) TABLET BY MOUTH ONCEDAILY AS NEEDED FOR ERECTILE DYSFUNCTION Patient not taking: Reported on 12/19/2017 07/24/17   Laurey Morale, MD    Family History Family History  Problem Relation Age of Onset  . Hypertension Father   . Sudden death Father   . Hypertension Mother   . Heart disease Sister   . Heart disease Sister   . Hypertension Sister   . Hypertension Sister   . Hypertension Brother   . Hypertension Brother   . Hypertension Brother   . Hypertension Brother   . Hypertension Sister     Social History Social History   Tobacco Use  . Smoking status: Never Smoker  . Smokeless tobacco: Never Used  Substance Use Topics  . Alcohol use: No  . Drug use: No     Allergies   Patient has no known allergies.   Review of Systems Review of Systems  Respiratory: Negative for shortness of breath.   Cardiovascular: Negative for chest pain, palpitations and leg swelling.  All other systems reviewed and are negative.    Physical Exam Updated Vital Signs BP 135/82   Pulse 60   Temp 99.3 F (37.4 C) (Oral)   Resp 17   Ht 5\' 8"  (1.727 m)   Wt 88.9 kg   SpO2 95%   BMI 29.80 kg/m   Physical Exam  Constitutional: He is oriented to person, place, and time. He appears well-developed and well-nourished. No distress.  HENT:  Head: Normocephalic and atraumatic.  Right Ear: Hearing normal.  Left Ear: Hearing normal.  Nose: Nose normal.  Mouth/Throat: Oropharynx is clear and moist and mucous membranes are normal.  Eyes: Pupils are equal, round, and reactive to light. Conjunctivae and EOM are normal.  Neck: Normal range of motion. Neck supple.  Cardiovascular: Regular rhythm, S1 normal and S2 normal. Exam reveals no gallop and no friction rub.  No murmur heard. Pulmonary/Chest: Effort normal and breath sounds normal. No respiratory distress. He exhibits no tenderness.  Abdominal: Soft. Normal appearance and bowel sounds are normal. There is no  hepatosplenomegaly. There is no tenderness. There is no rebound, no guarding, no tenderness at McBurney's point and negative Murphy's sign. No hernia.  Musculoskeletal: Normal range of motion.  Neurological: He is alert and oriented to person, place, and time. He has normal strength. No cranial nerve deficit or sensory deficit. Coordination normal. GCS eye subscore is 4. GCS verbal subscore is 5. GCS motor subscore is 6.  Skin: Skin is warm, dry and intact. No rash noted. No cyanosis.  Psychiatric: He has a normal mood and affect. His speech is normal and behavior is normal. Thought content normal.  Nursing note and vitals reviewed.    ED Treatments / Results  Labs (all labs ordered are listed, but only abnormal results are displayed) Labs  Reviewed  COMPREHENSIVE METABOLIC PANEL - Abnormal; Notable for the following components:      Result Value   Creatinine, Ser 1.29 (*)    GFR calc non Af Amer 60 (*)    All other components within normal limits  BRAIN NATRIURETIC PEPTIDE - Abnormal; Notable for the following components:   B Natriuretic Peptide 216.4 (*)    All other components within normal limits  CBC WITH DIFFERENTIAL/PLATELET  MAGNESIUM  I-STAT TROPONIN, ED    EKG EKG Interpretation  Date/Time:  Wednesday December 19 2017 20:46:48 EDT Ventricular Rate:  77 PR Interval:  216 QRS Duration: 144 QT Interval:  396 QTC Calculation: 448 R Axis:   -61 Text Interpretation:  Sinus rhythm with 1st degree A-V block with occasional Premature ventricular complexes Possible Left atrial enlargement Left axis deviation Left ventricular hypertrophy with QRS widening and repolarization abnormality Possible Lateral infarct , age undetermined Inferior infarct , age undetermined Abnormal ECG Confirmed by Gilda Crease ([id number]) on 12/20/2017 12:29:06 AM   Radiology Dg Chest 2 View  Result Date: 12/19/2017 CLINICAL DATA:  57 y/o  M; activated defibrillator. EXAM: CHEST - 2 VIEW  COMPARISON:  07/31/2015 chest radiograph FINDINGS: Stable cardiomegaly given projection and technique. Single lead AICD. No consolidation, effusion, or pneumothorax. Aortic calcific atherosclerosis. Bones are unremarkable. IMPRESSION: Stable cardiomegaly. No acute pulmonary process identified. Electronically Signed   By: Mitzi Hansen M.D.   On: 12/19/2017 23:54    Procedures Procedures (including critical care time)  Medications Ordered in ED Medications - No data to display   Initial Impression / Assessment and Plan / ED Course  I have reviewed the triage vital signs and the nursing notes.  Pertinent labs & imaging results that were available during my care of the patient were reviewed by me and considered in my medical decision making (see chart for details).     Patient arrives in the ER stating that his defibrillator fired this evening.  This has never happened to him before.  Patient has a history of nonischemic cardiomyopathy, nonsustained V. tach as well as previous cardiac arrest.  Patient did not have any symptoms today including immediately before the shock that would suggest symptomatic arrhythmia.  He is at his baseline currently without symptoms.  [employer] report has been obtained.  Since May 25th 2018 he has had 1203 episodes of nonsustained V. tach defined as greater than 4 beats and greater than 200 bpm.  Today he received treatment for V. fib lasting longer than 30 seconds.  Device delivered antitachycardia pacing followed by a 35 J shock which converted him to sinus rhythm.  Patient also had a documented episode of V. fib on April 11, 2017 that was self terminated.  Patient's electrolytes are unremarkable.  No evidence of significant volume overload.  Discussed with Dr. Kathrine Cords, on call for cardiology.  CRITICAL CARE Performed by: Gilda Crease   Total critical care time: 30 minutes  Critical care time was exclusive of separately billable  procedures and treating other patients.  Critical care was necessary to treat or prevent imminent or life-threatening deterioration.  Critical care was time spent personally by me on the following activities: development of treatment plan with patient and/or surrogate as well as nursing, discussions with consultants, evaluation of patient's response to treatment, examination of patient, obtaining history from patient or surrogate, ordering and performing treatments and interventions, ordering and review of laboratory studies, ordering and review of radiographic studies, pulse oximetry and re-evaluation of patient's condition.  Final Clinical Impressions(s) / ED Diagnoses   Final diagnoses:  VF (ventricular fibrillation) [facility])    ED Discharge Orders    None       Pollina, Canary Brim, MD 12/20/17 0155

## 2017-12-19 NOTE — ED Notes (Signed)
[employer] representative Loraine Leriche reports pt had a release of ATP, 35 Joule shock episode at 1837 today. Also a VF episode on 1/23 noted which was self terminated. Other ventricular arrhythmias, non-sustained today. Device is functioning.

## 2017-12-19 NOTE — ED Notes (Signed)
ICD interrogated  

## 2017-12-19 NOTE — ED Notes (Signed)
Patient transported to X-ray 

## 2017-12-19 NOTE — ED Triage Notes (Signed)
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 It is [employer]. Pt has had it since 2016 for low K. Pt denies any pain or any other sx.

## 2017-12-20 ENCOUNTER — Inpatient Hospital Stay (HOSPITAL_COMMUNITY): Payer: BLUE CROSS/BLUE SHIELD

## 2017-12-20 ENCOUNTER — Other Ambulatory Visit: Payer: Self-pay

## 2017-12-20 DIAGNOSIS — I472 Ventricular tachycardia: Secondary | ICD-10-CM | POA: Diagnosis not present

## 2017-12-20 DIAGNOSIS — I5030 Unspecified diastolic (congestive) heart failure: Secondary | ICD-10-CM

## 2017-12-20 DIAGNOSIS — I428 Other cardiomyopathies: Secondary | ICD-10-CM | POA: Diagnosis not present

## 2017-12-20 DIAGNOSIS — Z4502 Encounter for adjustment and management of automatic implantable cardiac defibrillator: Secondary | ICD-10-CM

## 2017-12-20 DIAGNOSIS — I519 Heart disease, unspecified: Secondary | ICD-10-CM

## 2017-12-20 LAB — MAGNESIUM: Magnesium: 2 mg/dL (ref 1.7–2.4)

## 2017-12-20 LAB — CBC WITH DIFFERENTIAL/PLATELET
Abs Immature Granulocytes: 0 10*3/uL (ref 0.0–0.1)
Basophils Absolute: 0.1 10*3/uL (ref 0.0–0.1)
Basophils Relative: 1 %
Eosinophils Absolute: 0.3 10*3/uL (ref 0.0–0.7)
Eosinophils Relative: 3 %
HCT: 47.8 % (ref 39.0–52.0)
Hemoglobin: 14.9 g/dL (ref 13.0–17.0)
Immature Granulocytes: 0 %
Lymphocytes Relative: 26 %
Lymphs Abs: 2.4 10*3/uL (ref 0.7–4.0)
MCH: 29.1 pg (ref 26.0–34.0)
MCHC: 31.2 g/dL (ref 30.0–36.0)
MCV: 93.4 fL (ref 78.0–100.0)
Monocytes Absolute: 0.8 10*3/uL (ref 0.1–1.0)
Monocytes Relative: 9 %
Neutro Abs: 5.6 10*3/uL (ref 1.7–7.7)
Neutrophils Relative %: 61 %
Platelets: 236 10*3/uL (ref 150–400)
RBC: 5.12 MIL/uL (ref 4.22–5.81)
RDW: 12.1 % (ref 11.5–15.5)
WBC: 9.2 10*3/uL (ref 4.0–10.5)

## 2017-12-20 LAB — COMPREHENSIVE METABOLIC PANEL
ALT: 39 U/L (ref 0–44)
AST: 35 U/L (ref 15–41)
Albumin: 4.4 g/dL (ref 3.5–5.0)
Alkaline Phosphatase: 42 U/L (ref 38–126)
Anion gap: 10 (ref 5–15)
BUN: 18 mg/dL (ref 6–20)
CO2: 24 mmol/L (ref 22–32)
Calcium: 9.6 mg/dL (ref 8.9–10.3)
Chloride: 105 mmol/L (ref 98–111)
Creatinine, Ser: 1.29 mg/dL — ABNORMAL HIGH (ref 0.61–1.24)
GFR calc Af Amer: >60 mL/min (ref >60)
GFR calc non Af Amer: 60 mL/min — ABNORMAL LOW (ref >60)
Glucose, Bld: 93 mg/dL (ref 70–99)
Potassium: 4.7 mmol/L (ref 3.5–5.1)
Sodium: 139 mmol/L (ref 135–145)
Total Bilirubin: 1.2 mg/dL (ref 0.3–1.2)
Total Protein: 7.3 g/dL (ref 6.5–8.1)

## 2017-12-20 LAB — ECHOCARDIOGRAM COMPLETE
Height: 68 in
Weight: 2998.26 oz

## 2017-12-20 LAB — BRAIN NATRIURETIC PEPTIDE: B Natriuretic Peptide: 216.4 pg/mL — ABNORMAL HIGH (ref 0.0–100.0)

## 2017-12-20 LAB — HIV ANTIBODY (ROUTINE TESTING W REFLEX): HIV Screen 4th Generation wRfx: NONREACTIVE

## 2017-12-20 LAB — TSH: TSH: 2.306 u[IU]/mL (ref 0.350–4.500)

## 2017-12-20 MED ORDER — CARVEDILOL 25 MG PO TABS: 25.0000 mg | ORAL_TABLET | Freq: Two times a day (BID) | ORAL | Status: DC

## 2017-12-20 MED ORDER — MAGNESIUM OXIDE 400 MG PO TABS: 400.0000 mg | ORAL_TABLET | Freq: Two times a day (BID) | ORAL | Status: DC

## 2017-12-20 MED ORDER — SODIUM CHLORIDE 0.9% FLUSH
3.0000 mL | Freq: Two times a day (BID) | INTRAVENOUS | Status: DC
  Administered 2017-12-20: 3 mL via INTRAVENOUS

## 2017-12-20 MED ORDER — ONDANSETRON HCL 4 MG/2ML IJ SOLN: 4.0000 mg | Freq: Four times a day (QID) | INTRAMUSCULAR | Status: DC | PRN

## 2017-12-20 MED ORDER — SODIUM CHLORIDE 0.9 % IV SOLN: 250.0000 mL | INTRAVENOUS | Status: DC | PRN

## 2017-12-20 MED ORDER — MAGNESIUM OXIDE 400 (241.3 MG) MG PO TABS
400.0000 mg | ORAL_TABLET | Freq: Two times a day (BID) | ORAL | Status: DC
  Administered 2017-12-20: 400 mg via ORAL
  Filled 2017-12-20: qty 1

## 2017-12-20 MED ORDER — ACETAMINOPHEN 325 MG PO TABS: 650.0000 mg | ORAL_TABLET | ORAL | Status: DC | PRN

## 2017-12-20 MED ORDER — ENOXAPARIN SODIUM 40 MG/0.4ML ~~LOC~~ SOLN
40.0000 mg | SUBCUTANEOUS | Status: DC
  Filled 2017-12-20: qty 0.4

## 2017-12-20 MED ORDER — FUROSEMIDE 40 MG PO TABS: 40.0000 mg | ORAL_TABLET | ORAL | Status: DC

## 2017-12-20 MED ORDER — PRAVASTATIN SODIUM 20 MG PO TABS
20.0000 mg | ORAL_TABLET | Freq: Every day | ORAL | Status: DC
  Filled 2017-12-20: qty 1

## 2017-12-20 MED ORDER — SODIUM CHLORIDE 0.9% FLUSH: 3.0000 mL | INTRAVENOUS | Status: DC | PRN

## 2017-12-20 MED ORDER — ASPIRIN EC 81 MG PO TBEC
81.0000 mg | DELAYED_RELEASE_TABLET | Freq: Every day | ORAL | Status: DC
  Administered 2017-12-20: 81 mg via ORAL
  Filled 2017-12-20: qty 1

## 2017-12-20 MED ORDER — CARVEDILOL 6.25 MG PO TABS
18.7500 mg | ORAL_TABLET | Freq: Two times a day (BID) | ORAL | Status: DC
  Administered 2017-12-20: 18.75 mg via ORAL
  Filled 2017-12-20: qty 1

## 2017-12-20 MED ORDER — CARVEDILOL 12.5 MG PO TABS: 18.7500 mg | ORAL_TABLET | Freq: Two times a day (BID) | ORAL | 6 refills | Status: DC

## 2017-12-20 MED ORDER — SPIRONOLACTONE 25 MG PO TABS
25.0000 mg | ORAL_TABLET | Freq: Every day | ORAL | Status: DC
  Filled 2017-12-20: qty 1

## 2017-12-20 MED ORDER — SACUBITRIL-VALSARTAN 24-26 MG PO TABS
1.0000 | ORAL_TABLET | Freq: Two times a day (BID) | ORAL | Status: DC
  Administered 2017-12-20: 1 via ORAL
  Filled 2017-12-20 (×3): qty 1

## 2017-12-20 NOTE — ED Notes (Signed)
Attempted report x1. 

## 2017-12-20 NOTE — Consult Note (Addendum)
Cardiology Consultation:   Patient ID: Brian Noble MRN: [id number]; DOB: 06-Sep-1960  Admit date: 12/19/2017 Date of Consult: 12/20/2017  Primary Care Provider: [name], MD Primary Cardiologist: Dr. Shirlee Latch Primary Electrophysiologist:  Dr. Graciela Husbands   Patient Profile:   Brian Noble is a 57 y.o. male with a hx of HTN, CKD, h/o VF arrest (?hypokalemia), NICM w/ICD who is being seen today for the evaluation of VT/ICD shoks at the request of Dr. Kathrine Cords.  History of Present Illness:   Brian Noble has been feeling well.  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  He will occassionally need to take a break at work, gets tired, though this is not new.  He reports taking all of his medicines as scheduled, no missed doses.  He has not had any CP, palpitations, no dizziness or weak spells, no syncope.  He sleeps well, denies symptoms of PND or orthopnea.  He feels like he has good exertional capacity.  Yesterday he was giving a sermon, feeling well, and without symptoms/warning was shocked by his device.  He did not have syncope or fall.  He completed his sermon and then came to the hospital.  This is his first ever shock.  He feels well, since, VSS here.  LABS K+ 4.7 Mag 2.0 BUN/Creat 18/1.29 BNP 216 poc Trop 0.01 WBC 9.2 H/H 14/47 Plts 236 TSH 2.306   Device information: MDT single chamber ICD, implanted 11/16/14, secondary prevention No prior h/o appropriate shocks  Device interrogation: [employer] Battery and lead measurements are good Optivol looks good VP <0.1% Episodes Since May 25/2018 2 treated episodes  15 NSVT 5 HVR 3 AF   12/19/17 one treated episode in VF zone ATP/one shock MMVT > ATP briefly broke, change morphology of VT > followed by 35J shock  historical (04/11/17: VT event in VF zone, self terminated, VF rx was aborted) available short EGM available for AF episodes is difficult, historically  these have been felt to be false and SR/PVCs   Past Medical History:  Diagnosis Date  . Arthritis   . Chronic kidney disease   . Chronic systolic CHF (congestive heart failure) (HCC)    a. Echo 8/16:  EF 10%, diff HK, Gr 1 DD, mild dilated aortic root, mild MR, mild LAE, severely reduced RVSF  . Hypertension   . ICD (implantable cardioverter-defibrillator) in place   . NICM (nonischemic cardiomyopathy) (HCC)    a. LHC 8/16:  normal coronary arteries.   . VF (ventricular fibrillation) (HCC)    s/p OOH cardiac arrest 8/16 >> s/p ICD    Past Surgical History:  Procedure Laterality Date  . APPENDECTOMY    . CARDIAC CATHETERIZATION N/A 11/10/2014   Procedure: Left Heart Cath and Coronary Angiography;  Surgeon: [name], MD;  Location: [facility] INVASIVE CV LAB;  Service: Cardiovascular;  Laterality: N/A;  . EP IMPLANTABLE DEVICE N/A 11/16/2014   Procedure: ICD Implant;  Surgeon: [name], MD;  Location: [facility] INVASIVE CV LAB;  Service: Cardiovascular;  Laterality: N/A;     Home Medications:  Prior to Admission medications   Medication Sig Start Date End Date Taking? Authorizing Provider  aspirin EC 81 MG tablet Take 81 mg by mouth daily.   Yes [provider]  carvedilol (COREG) 12.5 MG tablet Take 1.5 tablets (18.75 mg total) by mouth 2 (two) times daily. 02/27/17  Yes Laurey Morale, MD  colchicine 0.6 MG tablet TAKE ONE  TABLET BY MOUTH ONCE DAILY AS NEEDED FOR FLARE UPS Patient taking differently: Take 0.6 mg by mouth daily as needed (for gout).  04/26/17  Yes Laurey Morale, MD  ENTRESTO 24-26 MG TAKE ONE TABLET BY MOUTH TWO TIMES DAILY Patient taking differently: Take 1 tablet by mouth 2 (two) times daily.  10/24/17  Yes Bensimhon, Bevelyn Buckles, MD  eplerenone (INSPRA) 25 MG tablet TAKE ONE TABLET BY MOUTH ONCE DAILY Patient taking differently: Take 25 mg by mouth daily.  10/03/17  Yes [name], MD  furosemide (LASIX) 40 MG tablet Take 1 tablet (40 mg total) by mouth  every other day. 06/02/15  Yes Clegg, Amy D, NP  lovastatin (MEVACOR) 20 MG tablet Take 20 mg by mouth daily.    Yes [provider]  magnesium oxide (MAG-OX) 400 MG tablet Take 1 tablet (400 mg total) by mouth 2 (two) times daily. 11/17/14  Yes Short, Thea Silversmith, MD  eplerenone (INSPRA) 25 MG tablet Take 1 tablet (25 mg total) by mouth daily. Please make yearly appt with Dr. Graciela Husbands for May before anymore refills. 1st attempt Patient not taking: Reported on 12/19/2017 04/24/17   Tonye Becket D, NP  sildenafil (REVATIO) 20 MG tablet TAKE ONE-HALF (1/2) TABLET BY MOUTH ONCEDAILY AS NEEDED FOR ERECTILE DYSFUNCTION Patient not taking: Reported on 12/19/2017 07/24/17   Laurey Morale, MD    Inpatient Medications: Scheduled Meds: . aspirin EC  81 mg Oral Daily  . carvedilol  18.75 mg Oral BID  . enoxaparin (LOVENOX) injection  40 mg Subcutaneous Q24H  . [START ON 12/21/2017] furosemide  40 mg Oral QODAY  . magnesium oxide  400 mg Oral BID  . pravastatin  20 mg Oral q1800  . sacubitril-valsartan  1 tablet Oral BID  . sodium chloride flush  3 mL Intravenous Q12H  . spironolactone  25 mg Oral Daily   Continuous Infusions: . sodium chloride     PRN Meds: sodium chloride, acetaminophen, ondansetron (ZOFRAN) IV, sodium chloride flush  Allergies:   No Known Allergies  Social History:   Social History   Socioeconomic History  . Marital status: Married    Spouse name: Not on file  . Number of children: Not on file  . Years of education: Not on file  . Highest education level: Not on file  Occupational History  . Not on file  Social Needs  . Financial resource strain: Not on file  . Food insecurity:    Worry: Not on file    Inability: Not on file  . Transportation needs:    Medical: Not on file    Non-medical: Not on file  Tobacco Use  . Smoking status: Never Smoker  . Smokeless tobacco: Never Used  Substance and Sexual Activity  . Alcohol use: No  . Drug use: No  . Sexual  activity: Not on file  Lifestyle  . Physical activity:    Days per week: Not on file    Minutes per session: Not on file  . Stress: Not on file  Relationships  . Social connections:    Talks on phone: Not on file    Gets together: Not on file    Attends religious service: Not on file    Active member of club or organization: Not on file    Attends meetings of clubs or organizations: Not on file    Relationship status: Not on file  . Intimate partner violence:    Fear of current or ex partner:  Not on file    Emotionally abused: Not on file    Physically abused: Not on file    Forced sexual activity: Not on file  Other Topics Concern  . Not on file  Social History Narrative  . Not on file    Family History:   Family History  Problem Relation Age of Onset  . Hypertension Father   . Sudden death Father   . Hypertension Mother   . Heart disease Sister   . Heart disease Sister   . Hypertension Sister   . Hypertension Sister   . Hypertension Brother   . Hypertension Brother   . Hypertension Brother   . Hypertension Brother   . Hypertension Sister      ROS:  Please see the history of present illness.  All other ROS reviewed and negative.     Physical Exam/Data:   Vitals:   12/20/17 0245 12/20/17 0305 12/20/17 0742 12/20/17 0900  BP: 128/85 (!) 142/84 123/88 (!) 143/90  Pulse: (!) 58 (!) 59 63 71  Resp: 13 16    Temp:  98.3 F (36.8 C) 98 F (36.7 C) 98.2 F (36.8 C)  TempSrc:  Oral Oral Oral  SpO2: 95% 98% 98% 99%  Weight:  85 kg    Height:  5\' 8"  (1.727 m)     No intake or output data in the 24 hours ending 12/20/17 1020 Filed Weights   12/19/17 2059 12/20/17 0305  Weight: 88.9 kg 85 kg   Body mass index is 28.49 kg/m.  General:  Well nourished, well developed, in no acute distress HEENT: normal Lymph: no adenopathy Neck: no JVD Endocrine:  No thryomegaly Vascular: No carotid bruits Cardiac:  RRR; no murmurs, gallops or rubs Lungs:  CTA b/l, no  wheezing, rhonchi or rales  Abd: soft, nontender  Ext: no edema Musculoskeletal:  No deformities Skin: warm and dry  Neuro:  no focal abnormalities noted Psych:  Normal affect   EKG:  The EKG was personally reviewed and demonstrates:   SR, 77bpm, 1st degree AVblock, PVCs (multifocal), LAD, IVCD, no significant changes noted from prior Telemetry:  Telemetry was personally reviewed and demonstrates:   SR 60's, occ PVCs, rare couplet  Relevant CV Studies:  04/23/17: TTE Study Conclusions - Left ventricle: The cavity size was moderately dilated. Wall   thickness was normal. Systolic function was moderately to   severely reduced. The estimated ejection fraction was in the   range of 30% to 35%. Moderate diffuse hypokinesis with no   identifiable regional variations. Doppler parameters are   consistent with abnormal left ventricular relaxation (grade 1   diastolic dysfunction). No evidence of thrombus. - Ventricular septum: Septal motion showed abnormal function,   dyssynergy, and paradox. These changes are consistent with   intraventricular conduction delay. - Left atrium: The atrium was mildly dilated.  06/11/15: CPX Conclusion: Exercise testing with gas exchange demonstrates a mild functional impairment when compared to matched sedentary norms. There was no evidence of ventilatory limitations. At peak exercise, patient appears to have mild circulatory limitation. Note the frequent PVC and brief NSVT occuring more frequently at higher intensities  11/10/14: LHC IMPRESSION:Mr. Carranco had clean coronary arteries and severe LV dysfunction with EF 20% range. He had nonischemic myopathy. His witnessed sudden cardiac death was primarily arrhythmogenic. Similar possible that hypokalemia contributed to this. His left common femoral puncture site was sealed with a MYNX closure device. A total of 70 mL of contrast was administered to the  patient. Creatinine was 2.0. He will require a  defibrillator for secondary prevention.   Laboratory Data:  Chemistry Recent Labs  Lab 12/19/17 2321  NA 139  K 4.7  CL 105  CO2 24  GLUCOSE 93  BUN 18  CREATININE 1.29*  CALCIUM 9.6  GFRNONAA 60*  GFRAA >60  ANIONGAP 10    Recent Labs  Lab 12/19/17 2321  PROT 7.3  ALBUMIN 4.4  AST 35  ALT 39  ALKPHOS 42  BILITOT 1.2   Hematology Recent Labs  Lab 12/19/17 2321  WBC 9.2  RBC 5.12  HGB 14.9  HCT 47.8  MCV 93.4  MCH 29.1  MCHC 31.2  RDW 12.1  PLT 236   Cardiac EnzymesNo results for input(s): TROPONINI in the last 168 hours.  Recent Labs  Lab 12/19/17 2327  TROPIPOC 0.01    BNP Recent Labs  Lab 12/19/17 2321  BNP 216.4*    DDimer No results for input(s): DDIMER in the last 168 hours.  Radiology/Studies:   Dg Chest 2 View Result Date: 12/19/2017 CLINICAL DATA:  57 y/o  M; activated defibrillator. EXAM: CHEST - 2 VIEW COMPARISON:  07/31/2015 chest radiograph FINDINGS: Stable cardiomegaly given projection and technique. Single lead AICD. No consolidation, effusion, or pneumothorax. Aortic calcific atherosclerosis. Bones are unremarkable. IMPRESSION: Stable cardiomegaly. No acute pulmonary process identified. Electronically Signed   By: Mitzi Hansen M.D.   On: 12/19/2017 23:54    Assessment and Plan:   1. ICD shock, appropriate for VT     Electrolytes look good     No anginal symptoms, no CAD by cath in 2016     Does not appear to be acutely fluid OL      Up-titrate coreg Will ask for full programmer interrogation Dr. Johney Frame to see  I anticipate able to discharge today   For questions or updates, please contact [facility] Please consult [email or web address] for contact info under     Signed, Sheilah Pigeon, PA-C  12/20/2017 10:20 AM   I have seen, examined the patient, and reviewed the above assessment and plan.  Changes to above are made where necessary.  On exam, RRR.  Will increase coreg.  No driving x 6 months (pt aware).   Follow-up with Dr Milford Cage MD, [facility] 12/21/2017 1:52 PM

## 2017-12-20 NOTE — Discharge Instructions (Signed)
NO DRIVING FOR 6 MONTHS °

## 2017-12-20 NOTE — Progress Notes (Signed)
Patient is ready for discharge. He has all of his belongings. He has been taken off of telemetry and CCMD notified. IV has been removed without complication, cath intact. Patient has had all of his questions regarding his discharge instructions answered. He will be transported home by his wife who is here with him now. He will leave unit with his wife, he has refused a wheelchair. Patient is alert and oriented, has a steady gate and states he feels comfortable walking to the entrance of the hospital.

## 2017-12-20 NOTE — ED Notes (Signed)
Cards at bedside

## 2017-12-20 NOTE — Progress Notes (Signed)
  Echocardiogram 2D Echocardiogram has been performed.  Brian Noble F 12/20/2017, 2:44 PM

## 2017-12-20 NOTE — H&P (Signed)
Cardiology History & Physical    Patient ID: Brian Noble MRN: [id number], DOB: 1960-12-23 Date of Encounter: 12/20/2017, 2:28 AM Primary Physician: [name], MD  Chief Complaint: ICD shock   HPI: Brian Noble is a 57 y.o. male with history of nonischemic cardiomyopathy, cardiac arrest, CKD who presents the emergency room after an ICD discharge.  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  He states that he is never had ICD shock in the past.  He states that he had no chest pain, shortness of breath, palpitations prior or surrounding the event of the ICD shock.  He states that he has had no fevers, chills, night sweats, nausea, vomiting, constipation, dysuria, new numbness or weakness.  States that he has been adherent with his medications.  States that he has not noticed any new fluid accumulation in his legs, abdomen, hips.  Denies any exertional chest pain, shortness of breath.  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  He is never limited by chest pain or shortness of breath.  On my evaluation, patient states that he has no current symptoms of concern.  Past Medical History:  Diagnosis Date  . Arthritis   . Chronic kidney disease   . Chronic systolic CHF (congestive heart failure) (HCC)    a. Echo 8/16:  EF 10%, diff HK, Gr 1 DD, mild dilated aortic root, mild MR, mild LAE, severely reduced RVSF  . Hypertension   . ICD (implantable cardioverter-defibrillator) in place   . NICM (nonischemic cardiomyopathy) (HCC)    a. LHC 8/16:  normal coronary arteries.   . VF (ventricular fibrillation) (HCC)    s/p OOH cardiac arrest 8/16 >> s/p ICD     Surgical History:  Past Surgical History:  Procedure Laterality Date  . APPENDECTOMY    . CARDIAC CATHETERIZATION N/A 11/10/2014   Procedure: Left Heart Cath and Coronary Angiography;  Surgeon: [name], MD;   Location: [facility] INVASIVE CV LAB;  Service: Cardiovascular;  Laterality: N/A;  . EP IMPLANTABLE DEVICE N/A 11/16/2014   Procedure: ICD Implant;  Surgeon: [name], MD;  Location: [facility] INVASIVE CV LAB;  Service: Cardiovascular;  Laterality: N/A;     Home Meds: Prior to Admission medications   Medication Sig Start Date End Date Taking? Authorizing Provider  aspirin EC 81 MG tablet Take 81 mg by mouth daily.   Yes [provider]  carvedilol (COREG) 12.5 MG tablet Take 1.5 tablets (18.75 mg total) by mouth 2 (two) times daily. 02/27/17  Yes Laurey Morale, MD  colchicine 0.6 MG tablet TAKE ONE TABLET BY MOUTH ONCE DAILY AS NEEDED FOR FLARE UPS Patient taking differently: Take 0.6 mg by mouth daily as needed (for gout).  04/26/17  Yes Laurey Morale, MD  ENTRESTO 24-26 MG TAKE ONE TABLET BY MOUTH TWO TIMES DAILY Patient taking differently: Take 1 tablet by mouth 2 (two) times daily.  10/24/17  Yes Bensimhon, Bevelyn Buckles, MD  eplerenone (INSPRA) 25 MG tablet TAKE ONE TABLET BY MOUTH ONCE DAILY Patient taking differently: Take 25 mg by mouth daily.  10/03/17  Yes [name], MD  furosemide (LASIX) 40 MG tablet Take 1 tablet (40 mg total) by mouth every other day. 06/02/15  Yes Clegg, Amy D, NP  lovastatin (MEVACOR) 20 MG tablet Take 20 mg by mouth daily.    Yes  [provider]  magnesium oxide (MAG-OX) 400 MG tablet Take 1 tablet (400 mg total) by mouth 2 (two) times daily. 11/17/14  Yes Short, Thea Silversmith, MD  eplerenone (INSPRA) 25 MG tablet Take 1 tablet (25 mg total) by mouth daily. Please make yearly appt with Dr. Graciela Husbands for May before anymore refills. 1st attempt Patient not taking: Reported on 12/19/2017 04/24/17   Tonye Becket D, NP  sildenafil (REVATIO) 20 MG tablet TAKE ONE-HALF (1/2) TABLET BY MOUTH ONCEDAILY AS NEEDED FOR ERECTILE DYSFUNCTION Patient not taking: Reported on 12/19/2017 07/24/17   Laurey Morale, MD    Allergies: No Known Allergies  Social History    Socioeconomic History  . Marital status: Married    Spouse name: Not on file  . Number of children: Not on file  . Years of education: Not on file  . Highest education level: Not on file  Occupational History  . Not on file  Social Needs  . Financial resource strain: Not on file  . Food insecurity:    Worry: Not on file    Inability: Not on file  . Transportation needs:    Medical: Not on file    Non-medical: Not on file  Tobacco Use  . Smoking status: Never Smoker  . Smokeless tobacco: Never Used  Substance and Sexual Activity  . Alcohol use: No  . Drug use: No  . Sexual activity: Not on file  Lifestyle  . Physical activity:    Days per week: Not on file    Minutes per session: Not on file  . Stress: Not on file  Relationships  . Social connections:    Talks on phone: Not on file    Gets together: Not on file    Attends religious service: Not on file    Active member of club or organization: Not on file    Attends meetings of clubs or organizations: Not on file    Relationship status: Not on file  . Intimate partner violence:    Fear of current or ex partner: Not on file    Emotionally abused: Not on file    Physically abused: Not on file    Forced sexual activity: Not on file  Other Topics Concern  . Not on file  Social History Narrative  . Not on file     Family History  Problem Relation Age of Onset  . Hypertension Father   . Sudden death Father   . Hypertension Mother   . Heart disease Sister   . Heart disease Sister   . Hypertension Sister   . Hypertension Sister   . Hypertension Brother   . Hypertension Brother   . Hypertension Brother   . Hypertension Brother   . Hypertension Sister     Review of Systems: Patient specifically denies chest pain, shortness of breath, palpitations, PND, orthopnea, edema, nausea/vomiting/constipation:/Diarrhea, dysuria, new numbness or weakness All other systems reviewed and are otherwise negative except as  noted above.  Labs:   Lab Results  Component Value Date   WBC 9.2 12/19/2017   HGB 14.9 12/19/2017   HCT 47.8 12/19/2017   MCV 93.4 12/19/2017   PLT 236 12/19/2017    Recent Labs  Lab 12/19/17 2321  NA 139  K 4.7  CL 105  CO2 24  BUN 18  CREATININE 1.29*  CALCIUM 9.6  PROT 7.3  BILITOT 1.2  ALKPHOS 42  ALT 39  AST 35  GLUCOSE 93   No results for input(s):  CKTOTAL, CKMB, TROPONINI in the last 72 hours. Lab Results  Component Value Date   TRIG 165 (H) 11/12/2014   No results found for: DDIMER  Radiology/Studies:  Dg Chest 2 View  Result Date: 12/19/2017 CLINICAL DATA:  57 y/o  M; activated defibrillator. EXAM: CHEST - 2 VIEW COMPARISON:  07/31/2015 chest radiograph FINDINGS: Stable cardiomegaly given projection and technique. Single lead AICD. No consolidation, effusion, or pneumothorax. Aortic calcific atherosclerosis. Bones are unremarkable. IMPRESSION: Stable cardiomegaly. No acute pulmonary process identified. Electronically Signed   By: Mitzi Hansen M.D.   On: 12/19/2017 23:54   Wt Readings from Last 3 Encounters:  12/19/17 88.9 kg  03/22/17 93.9 kg  08/11/16 89.8 kg    EKG: LVH with repolarization abnormalities, multiformed PVCs without fixed coupling intervals and morphology.  Physical Exam: Blood pressure 135/82, pulse 60, temperature 99.3 F (37.4 C), temperature source Oral, resp. rate 17, height 5\' 8"  (1.727 m), weight 88.9 kg, SpO2 95 %. Body mass index is 29.8 kg/m. General: Well developed, well nourished, in no acute distress. Head: Normocephalic, atraumatic, sclera non-icteric, no xanthomas, nares are without discharge.  Neck: Negative for carotid bruits. JVD not elevated. Lungs: Clear bilaterally to auscultation without wheezes, rales, or rhonchi. Breathing is unlabored. Heart: RRR with S1 S2. No murmurs, rubs, or gallops appreciated. Abdomen: Soft, non-tender, non-distended with normoactive bowel sounds. No hepatomegaly. No  rebound/guarding. No obvious abdominal masses. Msk:  Strength and tone appear normal for age. Extremities: No clubbing or cyanosis. No edema.  Distal pedal pulses are 2+ and equal bilaterally. Neuro: Alert and oriented X 3. No focal deficit. No facial asymmetry. Moves all extremities spontaneously. Psych:  Responds to questions appropriately with a normal affect.   ICD interrogation: Interrogation of his device demonstrates that he has had 1203 episodes of nonsustained VT greater than 200 bpm since his last interrogation in May 2018.  He had an event this afternoon at 6:30 PM of ventricular fibrillation that was treated with a 35 J shock.  This is his first tachy therapy that is been delivered with this device.  Review of the electrograms demonstrates a rapid wide-complex rhythm consistent with ventricular tachycardia.  His device is otherwise functioning normally, battery life 9.2 years, lead impedances and thresholds are within normal limits.   Assessment and Plan  Brian Noble is a 57 y.o. male with history of nonischemic cardiomyopathy, cardiac arrest, CKD who presents the emergency room after an ICD discharge.  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  He had an event in August 2016 which required external defibrillation and subsequent cooling.  The patient's wife notes that he had very low potassium at the time of that arrest was felt to be the primary cause of his arrhythmia.  He was found to have a low EF (10%) after that event but it was not clear if that was causative or effect of the arrest itself.  Today he presents with an episode of ventricular tachycardia (as well as several episodes of nonsustained ventricular tachycardia) in the setting of normal potassium, magnesium, troponin, fluid status.  States that he has not had no viral illnesses or other changes in his health recently. His last TSH was checked 2 years ago so I  will reassess that.  He is currently asymptomatic and hemodynamically stable though he continues to have occasional PVCs.  He is on excellent heart failure medications and reports adherence to all of these medications. I suspect  that his ventricular tachycardia is related to his underlying cardiomyopathy rather than a reversible cause.  Given the high burden of ventricular tachycardia events, will discuss with patient's electrophysiologist the option of starting an antiarrhythmic medicine such as lidocaine, procainamide, amiodarone.  I suspect he would not be a good candidate for ablation given the polymorphic nature of his PVCs.  We will monitor on telemetry overnight to ensure that he does not have further recurrences of these tachyarrhythmias. -Monitor on telemetry overnight -Discussed with the EP team in the morning antiarrhythmic options  #Nonischemic cardiomyopathy with EF of 35% Patient is NYHA class I-II on excellent therapies including beta-blocker, Entresto, eplerenone, Lasix.  He appears euvolemic on exam today.  We will continue his outpatient Lasix regimen of 1 pill every other day.  We will reassess his heart function given the increased burden of arrhythmia as noted above. - Echo in a.m. - Continue carvedilol, Entresto, eplerenone, Lasix  Signed, Oliva Bustard, MD 12/20/2017, 2:28 AM

## 2017-12-20 NOTE — Discharge Summary (Addendum)
DISCHARGE SUMMARY    Patient ID: Brian Noble,  MRN: [id number], DOB/AGE: 03/27/1960 57 y.o.  Admit date: 12/19/2017 Discharge date: 12/20/2017  Primary Care Physician: [name], MD  Primary Cardiologist: Dr. Shirlee Latch Electrophysiologist: Dr. Johney Frame  Primary Discharge Diagnosis:  1. VT  Secondary Discharge Diagnosis:  1. NICM 2. HTN 3. CKD  No Known Allergies   Procedures This Admission:  none  Brief HPI: Brian Noble is a 57 y.o. w/PMHx of HTN, CKD, h/o VF arrest (?hypokalemia), NICM w/ICD sought medical attention after being shocked by his ICD. 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  He has no pre-shock symptoms, no near syncope, he did not faint.  He had no CP, palpitations, SOB.  No cardiac awareness at all other then the shock.  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  His device was interrogated noted appropriate therapy for VT.  EP was consulted to the case.  [facility] Battery and lead measurements are good Optivol looks good VP <0.1% Episodes Since May 25/2018 2 treated episodes  15 NSVT 5 HVR 3 AF 12/19/17 one treated episode in VF zone ATP/one shock MMVT > ATP briefly broke, change morphology of VT > followed by 35J shock historical (04/11/17: VT event in VF zone, self terminated, VF rx was aborted) available short EGM available for AF episodes is difficult, historically these have been felt to be false and SR/PVCs  The patient has remained without symptoms here.  His coreg listed on home list as 18.75mg  BID, was up-titrated to 25mg , though the patient and wife report that he was actually only taking 12.5mg  BID, reduced 2/2  significant fatigue with the higher dose.  He had only one NSVT while here.  The patient feels well and would like to go home.   Dr. Johney Frame discussed with the patient, would like to re-try 18.5mg  BID and see if he can perhaps tolerate it and felt he was stable for discharge to home. The patient was made aware of [place] law, no driving for 6 months given VT/IcD therapy.  Early follow up with EP service is in place.   Physical Exam: Vitals:   12/20/17 0305 12/20/17 0742 12/20/17 0900 12/20/17 1454  BP: (!) 142/84 123/88 (!) 143/90 112/73  Pulse: (!) 59 63 71 69  Resp: 16   16  Temp: 98.3 F (36.8 C) 98 F (36.7 C) 98.2 F (36.8 C) 98 F (36.7 C)  TempSrc: Oral Oral Oral Oral  SpO2: 98% 98% 99% 98%  Weight: 85 kg     Height: 5\' 8"  (1.727 m)       Labs:   Lab Results  Component Value Date   WBC 9.2 12/19/2017   HGB 14.9 12/19/2017   HCT 47.8 12/19/2017   MCV 93.4 12/19/2017   PLT 236 12/19/2017    Recent Labs  Lab 12/19/17 2321  NA 139  K 4.7  CL 105  CO2 24  BUN 18  CREATININE 1.29*  CALCIUM 9.6  PROT 7.3  BILITOT 1.2  ALKPHOS 42  ALT 39  AST 35  GLUCOSE 93    Discharge Medications:  Allergies as of 12/20/2017   No Known Allergies  Medication List    TAKE these medications   aspirin EC 81 MG tablet Take 81 mg by mouth daily.   carvedilol 12.5 MG tablet Commonly known as:  COREG Take 1.5 tablets (18.75 mg total) by mouth 2 (two) times daily.   colchicine 0.6 MG tablet TAKE ONE TABLET BY MOUTH ONCE DAILY AS NEEDED FOR FLARE UPS What changed:  See the new instructions.   ENTRESTO 24-26 MG Generic drug:  sacubitril-valsartan TAKE ONE TABLET BY MOUTH TWO TIMES DAILY   eplerenone 25 MG tablet Commonly known as:  INSPRA TAKE ONE TABLET BY MOUTH ONCE DAILY What changed:  Another medication with the same name was removed. Continue taking this medication, and follow the directions you see here.   furosemide 40 MG tablet Commonly known as:   LASIX Take 1 tablet (40 mg total) by mouth every other day.   lovastatin 20 MG tablet Commonly known as:  MEVACOR Take 20 mg by mouth daily.   magnesium oxide 400 MG tablet Commonly known as:  MAG-OX Take 1 tablet (400 mg total) by mouth 2 (two) times daily.   sildenafil 20 MG tablet Commonly known as:  REVATIO TAKE ONE-HALF (1/2) TABLET BY MOUTH ONCEDAILY AS NEEDED FOR ERECTILE DYSFUNCTION       Disposition:  Home  Discharge Instructions    Diet - low sodium heart healthy   Complete by:  As directed    Increase activity slowly   Complete by:  As directed      Follow-up Information    Marily Lente, NP Follow up on 01/09/2018.   Specialty:  Cardiology Why:  12:30PM Contact information: [address] [place] [address] [phone]           Duration of Discharge Encounter: Greater than 30 minutes including physician time.  Signed, Francis Dowse, PA-C 12/20/2017 4:39 PM   I have seen, examined the patient, and reviewed the above assessment and plan.  Changes to above are made where necessary.  On exam, RRR.  Will increase coreg.  No driving x 6 months (pt aware).  Follow-up with Dr Graciela Husbands  Co Sign: Hillis Range, MD 12/21/2017 1:49 PM

## 2018-01-04 NOTE — Progress Notes (Signed)
Electrophysiology Office Note Date: 01/09/2018  ID:  Brian Noble, DOB December 13, 1960, MRN [id number]  PCP: [name], MD Primary Cardiologist: Shirlee Latch Electrophysiologist: Graciela Husbands  CC: Routine ICD follow-up  Brian Noble is a 57 y.o. male seen today for Dr Graciela Husbands.  He presents today for routine electrophysiology followup.  Since last being seen in our clinic, the patient reports doing relatively well. He is anxious about recurrent ICD therapy. He has had intermittent palpitations but no further ICD shocks. He also has had intermittent exertional chest tightness and shortness of breath which is new for him. Cath from 2016 reviewed. He denies PND, orthopnea, nausea, vomiting, syncope, edema, weight gain, or early satiety.   Device History: MDT single chamber ICD implanted 2016 for VF arrest History of appropriate therapy: Yes History of AAD therapy: No   Past Medical History:  Diagnosis Date  . Arthritis   . Chronic kidney disease   . Chronic systolic CHF (congestive heart failure) (HCC)    a. Echo 8/16:  EF 10%, diff HK, Gr 1 DD, mild dilated aortic root, mild MR, mild LAE, severely reduced RVSF  . Hypertension   . ICD (implantable cardioverter-defibrillator) in place   . NICM (nonischemic cardiomyopathy) (HCC)    a. LHC 8/16:  normal coronary arteries.   . VF (ventricular fibrillation) (HCC)    s/p OOH cardiac arrest 8/16 >> s/p ICD   Past Surgical History:  Procedure Laterality Date  . APPENDECTOMY    . CARDIAC CATHETERIZATION N/A 11/10/2014   Procedure: Left Heart Cath and Coronary Angiography;  Surgeon: [name], MD;  Location[facility] INVASIVE CV LAB;  Service: Cardiovascular;  Laterality: N/A;  . EP IMPLANTABLE DEVICE N/A 11/16/2014   Procedure: ICD Implant;  Surgeon: [name], MD;  Location: [facility] INVASIVE CV LAB;  Service: Cardiovascular;  Laterality: N/A;    Current Outpatient Medications  Medication Sig Dispense Refill  . aspirin EC 81 MG tablet  Take 81 mg by mouth daily.    . carvedilol (COREG) 12.5 MG tablet Take 1.5 tablets (18.75 mg total) by mouth 2 (two) times daily. 90 tablet 6  . colchicine 0.6 MG tablet TAKE ONE TABLET BY MOUTH ONCE DAILY AS NEEDED FOR FLARE UPS 15 tablet 0  . ENTRESTO 24-26 MG TAKE ONE TABLET BY MOUTH TWO TIMES DAILY 60 tablet 3  . eplerenone (INSPRA) 25 MG tablet TAKE ONE TABLET BY MOUTH ONCE DAILY 30 tablet 5  . furosemide (LASIX) 40 MG tablet Take 1 tablet (40 mg total) by mouth every other day. 30 tablet 0  . lovastatin (MEVACOR) 20 MG tablet Take 20 mg by mouth daily.     . magnesium oxide (MAG-OX) 400 MG tablet Take 1 tablet (400 mg total) by mouth 2 (two) times daily. 60 tablet 0  . sildenafil (REVATIO) 20 MG tablet TAKE ONE-HALF (1/2) TABLET BY MOUTH ONCEDAILY AS NEEDED FOR ERECTILE DYSFUNCTION 10 tablet 1   No current facility-administered medications for this visit.     Allergies:   Patient has no known allergies.   Social History: Social History   Socioeconomic History  . Marital status: Married    Spouse name: Not on file  . Number of children: Not on file  . Years of education: Not on file  . Highest education level: Not on file  Occupational History  . Not on file  Social Needs  . Financial resource strain: Not on file  . Food insecurity:    Worry: Not on  file    Inability: Not on file  . Transportation needs:    Medical: Not on file    Non-medical: Not on file  Tobacco Use  . Smoking status: Never Smoker  . Smokeless tobacco: Never Used  Substance and Sexual Activity  . Alcohol use: No  . Drug use: No  . Sexual activity: Not on file  Lifestyle  . Physical activity:    Days per week: Not on file    Minutes per session: Not on file  . Stress: Not on file  Relationships  . Social connections:    Talks on phone: Not on file    Gets together: Not on file    Attends religious service: Not on file    Active member of club or organization: Not on file    Attends meetings  of clubs or organizations: Not on file    Relationship status: Not on file  . Intimate partner violence:    Fear of current or ex partner: Not on file    Emotionally abused: Not on file    Physically abused: Not on file    Forced sexual activity: Not on file  Other Topics Concern  . Not on file  Social History Narrative  . Not on file    Family History: Family History  Problem Relation Age of Onset  . Hypertension Father   . Sudden death Father   . Hypertension Mother   . Heart disease Sister   . Heart disease Sister   . Hypertension Sister   . Hypertension Sister   . Hypertension Brother   . Hypertension Brother   . Hypertension Brother   . Hypertension Brother   . Hypertension Sister     Review of Systems: All other systems reviewed and are otherwise negative except as noted above.   Physical Exam: VS:  BP 122/80   Pulse 63   Ht 5\' 8"  (1.727 m)   Wt 197 lb (89.4 kg)   BMI 29.95 kg/m  , BMI Body mass index is 29.95 kg/m.  GEN- The patient is well appearing, alert and oriented x 3 today.   HEENT: normocephalic, atraumatic; sclera clear, conjunctiva pink; hearing intact; oropharynx clear; neck supple  Lungs- Clear to ausculation bilaterally, normal work of breathing.  No wheezes, rales, rhonchi Heart- Regular rate and rhythm  GI- soft, non-tender, non-distended, bowel sounds present  Extremities- no clubbing, cyanosis, or edema  MS- no significant deformity or atrophy Skin- warm and dry, no lesion; ICD pocket well healed, rash BLE Psych- euthymic mood, full affect Neuro- strength and sensation are intact  ICD interrogation- reviewed in detail today,  See PACEART report  EKG:  EKG is ordered today. EKG today demonstrates SR, LBBB, QRS , unchanged from previous  Recent Labs: 12/19/2017: ALT 39; B Natriuretic Peptide 216.4; BUN 18; Creatinine, Ser 1.29; Hemoglobin 14.9; Magnesium 2.0; Platelets 236; Potassium 4.7; Sodium 139 12/20/2017: TSH 2.306   Wt  Readings from Last 3 Encounters:  01/09/18 197 lb (89.4 kg)  12/20/17 187 lb 6.3 oz (85 kg)  03/22/17 207 lb (93.9 kg)     Other studies Reviewed: Additional studies/ records that were reviewed today include: hospital records    Assessment and Plan:  1.  Chronic systolic dysfunction euvolemic today Stable on an appropriate medical regimen Normal ICD function See [facility] report No changes today Follow up with AHF clinic 2-3 weeks (discussed with Tonye Becket, NP today)  2.  VF No recurrence of sustained arrhythmia since  recent ER visit He has had several NSVT episodes Increase Coreg to 25mg  twice daily (he is concerned about fatigue - we will try and have him call in next week with progress) Keep K>3.9, Mg >1.8 No driving x6 months (until 03/6107)  3.  HTN Stable No change required today  4.  Chest pain Typical symptoms concerning for angina Cath 2016 without CAD Will obtain lexiscan myoview  Recent echo reviewed    Current medicines are reviewed at length with the patient today.   The patient does not have concerns regarding his medicines.  The following changes were made today:  Increase Coreg to 25mg  twice daily  Labs/ tests ordered today include: myoview Orders Placed This Encounter  Procedures  . CUP PACEART INCLINIC DEVICE CHECK     Disposition:   Follow up with [facility], Dr Graciela Husbands 6 months, AHF 2-3 weeks   Signed, [name], NP 01/09/2018 12:51 PM  [facility] [address] [place] [address] [phone] (office) [phone] (fax)

## 2018-01-09 ENCOUNTER — Encounter: Payer: Self-pay | Admitting: *Deleted

## 2018-01-09 ENCOUNTER — Encounter: Payer: Self-pay | Admitting: Nurse Practitioner

## 2018-01-09 ENCOUNTER — Ambulatory Visit (INDEPENDENT_AMBULATORY_CARE_PROVIDER_SITE_OTHER): Payer: BLUE CROSS/BLUE SHIELD | Admitting: Nurse Practitioner

## 2018-01-09 VITALS — BP 122/80 | HR 63 | Ht 68.0 in | Wt 197.0 lb

## 2018-01-09 DIAGNOSIS — I5022 Chronic systolic (congestive) heart failure: Secondary | ICD-10-CM

## 2018-01-09 DIAGNOSIS — I428 Other cardiomyopathies: Secondary | ICD-10-CM

## 2018-01-09 DIAGNOSIS — I472 Ventricular tachycardia, unspecified: Secondary | ICD-10-CM

## 2018-01-09 DIAGNOSIS — R079 Chest pain, unspecified: Secondary | ICD-10-CM

## 2018-01-09 DIAGNOSIS — I10 Essential (primary) hypertension: Secondary | ICD-10-CM

## 2018-01-09 DIAGNOSIS — I4901 Ventricular fibrillation: Secondary | ICD-10-CM

## 2018-01-09 LAB — CUP PACEART INCLINIC DEVICE CHECK
Date Time Interrogation Session: 20191023121645
Implantable Lead Implant Date: 20160829
Implantable Lead Location: 753860
Implantable Pulse Generator Implant Date: 20160829

## 2018-01-09 MED ORDER — CARVEDILOL 25 MG PO TABS: 25.0000 mg | ORAL_TABLET | Freq: Two times a day (BID) | ORAL | 6 refills | Status: DC

## 2018-01-09 NOTE — Patient Instructions (Addendum)
Medication Instructions:    START  TAKING COREG 25 MG TWICE  A DAY   If you need a refill on your cardiac medications before your next appointment, please call your pharmacy.  Labwork: NONE ORDERED  TODAY    Testing/Procedures: Your physician has requested that you have en lexiscan tress myoview. For further information please visit [email or web address] Please follow instruction sheet, as given.     Follow-Up:  Your physician wants you to follow-up in:  IN  6  MONTHS WITH DR Logan Bores will receive a reminder letter in the mail two months in advance. If you don't receive a letter, please call our office to schedule the follow-up appointment.    Remote monitoring is used to monitor your Pacemaker of ICD from home. This monitoring reduces the number of office visits required to check your device to one time per year. It allows [place] to keep an eye on the functioning of your device to ensure it is working properly. You are scheduled for a device check from home on . 02-12-18  You may send your transmission at any time that day. If you have a wireless device, the transmission will be sent automatically. After your physician reviews your transmission, you will receive a postcard with your next transmission date.      Any Other Special Instructions Will Be Listed Below (If Applicable).

## 2018-01-14 ENCOUNTER — Telehealth (HOSPITAL_COMMUNITY): Payer: Self-pay | Admitting: *Deleted

## 2018-01-14 NOTE — Telephone Encounter (Signed)
Left message on voicemail in reference to upcoming appointment scheduled for 01/16/18. Phone number given for a call back so details instructions can be given. Zylon Creamer W   

## 2018-01-15 ENCOUNTER — Telehealth (HOSPITAL_COMMUNITY): Payer: Self-pay | Admitting: *Deleted

## 2018-01-15 NOTE — Telephone Encounter (Signed)
Left message on voicemail in reference to upcoming appointment scheduled for 01/16/18. Phone number given for a call back so details instructions can be given. Willman Cuny W   

## 2018-01-16 ENCOUNTER — Ambulatory Visit (HOSPITAL_COMMUNITY): Payer: BLUE CROSS/BLUE SHIELD | Attending: Cardiology

## 2018-01-16 DIAGNOSIS — R079 Chest pain, unspecified: Secondary | ICD-10-CM | POA: Diagnosis not present

## 2018-01-16 LAB — MYOCARDIAL PERFUSION IMAGING
LV dias vol: 295 mL (ref 62–150)
LV sys vol: 239 mL
Peak HR: 75 {beats}/min
Rest HR: 53 {beats}/min
SDS: 4
SRS: 6
SSS: 10
TID: 1.05

## 2018-01-16 MED ORDER — TECHNETIUM TC 99M TETROFOSMIN IV KIT
31.7000 | PACK | Freq: Once | INTRAVENOUS | Status: AC | PRN
  Administered 2018-01-16: 31.7 via INTRAVENOUS
  Filled 2018-01-16: qty 32

## 2018-01-16 MED ORDER — REGADENOSON 0.4 MG/5ML IV SOLN
0.4000 mg | Freq: Once | INTRAVENOUS | Status: AC
  Administered 2018-01-16: 0.4 mg via INTRAVENOUS

## 2018-01-16 MED ORDER — TECHNETIUM TC 99M TETROFOSMIN IV KIT
10.6000 | PACK | Freq: Once | INTRAVENOUS | Status: AC | PRN
  Administered 2018-01-16: 10.6 via INTRAVENOUS
  Filled 2018-01-16: qty 11

## 2018-01-17 ENCOUNTER — Telehealth: Payer: Self-pay

## 2018-01-17 NOTE — Telephone Encounter (Signed)
Notes recorded by Sigurd Sos, RN on 01/17/2018 at 4:06 PM EDT The patient has been notified of the result and verbalized understanding. All questions (if any) were answered. He will contact the HF clinic and arrange an appt soon. Sigurd Sos, RN 01/17/2018 4:04 PM

## 2018-01-17 NOTE — Telephone Encounter (Signed)
Notes recorded by Sigurd Sos, RN on 01/17/2018 at 9:12 AM EDT [id number] 10/31 ------

## 2018-01-17 NOTE — Telephone Encounter (Signed)
-----   Message from Amber K Seiler, NP sent at 01/16/2018  6:37 PM EDT ----- Please notify patient of myoview results.  EF severely depressed (consistent with prior catheterization).  He needs to follow up with Amy Clegg, NP in HF clinic in the next week or so. They may want to do R/L heart cath.  Also, please check to see how he is doing with increased dose of Coreg. Thank you! 

## 2018-01-17 NOTE — Telephone Encounter (Signed)
-----   Message from Marily Lente, NP sent at 01/16/2018  6:37 PM EDT ----- Please notify patient of myoview results.  EF severely depressed (consistent with prior catheterization).  He needs to follow up with Tonye Becket, NP in HF clinic in the next week or so. They may want to do R/L heart cath.  Also, please check to see how he is doing with increased dose of Coreg. Thank you!

## 2018-01-23 ENCOUNTER — Encounter: Payer: BLUE CROSS/BLUE SHIELD | Admitting: Physician Assistant

## 2018-01-29 ENCOUNTER — Ambulatory Visit (HOSPITAL_COMMUNITY)
Admission: RE | Admit: 2018-01-29 | Discharge: 2018-01-29 | Disposition: A | Discharge status: Home / Self Care | Payer: BLUE CROSS/BLUE SHIELD | Source: Ambulatory Visit | Attending: Internal Medicine | Admitting: Internal Medicine

## 2018-01-29 VITALS — BP 130/78 | HR 59 | Wt 198.0 lb

## 2018-01-29 DIAGNOSIS — I5022 Chronic systolic (congestive) heart failure: Secondary | ICD-10-CM | POA: Diagnosis not present

## 2018-01-29 DIAGNOSIS — Z79899 Other long term (current) drug therapy: Secondary | ICD-10-CM | POA: Diagnosis not present

## 2018-01-29 DIAGNOSIS — I472 Ventricular tachycardia, unspecified: Secondary | ICD-10-CM

## 2018-01-29 DIAGNOSIS — R5381 Other malaise: Secondary | ICD-10-CM | POA: Diagnosis not present

## 2018-01-29 DIAGNOSIS — I110 Hypertensive heart disease with heart failure: Secondary | ICD-10-CM | POA: Insufficient documentation

## 2018-01-29 DIAGNOSIS — I447 Left bundle-branch block, unspecified: Secondary | ICD-10-CM | POA: Insufficient documentation

## 2018-01-29 DIAGNOSIS — I4729 Other ventricular tachycardia: Secondary | ICD-10-CM

## 2018-01-29 DIAGNOSIS — Z8249 Family history of ischemic heart disease and other diseases of the circulatory system: Secondary | ICD-10-CM | POA: Diagnosis not present

## 2018-01-29 DIAGNOSIS — Z8674 Personal history of sudden cardiac arrest: Secondary | ICD-10-CM | POA: Insufficient documentation

## 2018-01-29 DIAGNOSIS — Z7982 Long term (current) use of aspirin: Secondary | ICD-10-CM | POA: Diagnosis not present

## 2018-01-29 DIAGNOSIS — Z95810 Presence of automatic (implantable) cardiac defibrillator: Secondary | ICD-10-CM | POA: Insufficient documentation

## 2018-01-29 DIAGNOSIS — E785 Hyperlipidemia, unspecified: Secondary | ICD-10-CM | POA: Insufficient documentation

## 2018-01-29 DIAGNOSIS — I428 Other cardiomyopathies: Secondary | ICD-10-CM

## 2018-01-29 LAB — BASIC METABOLIC PANEL
Anion gap: 5 (ref 5–15)
BUN: 16 mg/dL (ref 6–20)
CO2: 28 mmol/L (ref 22–32)
Calcium: 9.3 mg/dL (ref 8.9–10.3)
Chloride: 105 mmol/L (ref 98–111)
Creatinine, Ser: 1.11 mg/dL (ref 0.61–1.24)
GFR calc Af Amer: >60 mL/min (ref >60)
GFR calc non Af Amer: >60 mL/min (ref >60)
Glucose, Bld: 101 mg/dL — ABNORMAL HIGH (ref 70–99)
Potassium: 5.1 mmol/L (ref 3.5–5.1)
Sodium: 138 mmol/L (ref 135–145)

## 2018-01-29 LAB — ABO/RH: ABO/RH(D): A POS

## 2018-01-29 LAB — CBC
HCT: 48.1 % (ref 39.0–52.0)
Hemoglobin: 14.6 g/dL (ref 13.0–17.0)
MCH: 28.6 pg (ref 26.0–34.0)
MCHC: 30.4 g/dL (ref 30.0–36.0)
MCV: 94.1 fL (ref 80.0–100.0)
Platelets: 229 10*3/uL (ref 150–400)
RBC: 5.11 MIL/uL (ref 4.22–5.81)
RDW: 12.2 % (ref 11.5–15.5)
WBC: 7 10*3/uL (ref 4.0–10.5)
nRBC: 0 % (ref 0.0–0.2)

## 2018-01-29 LAB — MAGNESIUM: Magnesium: 2.3 mg/dL (ref 1.7–2.4)

## 2018-01-29 LAB — PROTIME-INR
INR: 0.97
Prothrombin Time: 12.8 seconds (ref 11.4–15.2)

## 2018-01-29 LAB — TYPE AND SCREEN
ABO/RH(D): A POS
Antibody Screen: NEGATIVE

## 2018-01-29 LAB — BRAIN NATRIURETIC PEPTIDE: B Natriuretic Peptide: 244.1 pg/mL — ABNORMAL HIGH (ref 0.0–100.0)

## 2018-01-29 NOTE — H&P (View-Only) (Signed)
Patient ID: Brian Noble, male   DOB: February 09, 1961, 57 y.o.   MRN: [id number]   PCP: Dr. Marina Goodell Cardiology: Dr Shirlee Latch EP Dr Graciela Husbands   89 yo with a prior history of cardiomyopathy who developed a cardiac arrest and was found to have low EF and normal coronaries.  Per patient, he was found to have low EF around 20% about 8 years ago.  He was seen by a cardiologist in [place] (he thinks), and EF went back to normal range.  He had no problems up until 8/16.  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  He was noted to pass out by family.  EMS was called and arrived quickly.  He was in either ventricular fibrillation or VT and was defibrillated.  He was cooled and sent for coronary angiography, which showed no significant coronary disease.  Echo showed EF 10% with severe RV dysfunction.  After recovery, he got a Medtronic ICD.  Repeat echo in 11/16 showed EF 20% with septal-lateral dyssynchrony, moderately decreased RV systolic function.  Unable to take Bidil due to severe headaches.    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 Appropriate shock due to VT. ECHO completed and showed EF 20-25% and normal RV. Carvedilol increased to 18.75 mg twice a day. He was discharged on 12/20/17. He had follow up with [name] EP NP and carvedilol was increased to 25 mg twice a day.   He sent for stress on 01/16/18. High risk with mass and likely inferior wall infarct from apex to base with no ischemia and diffuse hypokinesis EF 19%.   Today he returns for HF follow up with his wife.Since his hospitalization he has had increased shortness of breath. SOB on flat ground and now has to stop walking up steps. This is a significant change for him.  Overall complaining of fatigue. He has been going to work 6 days a week but when gets home he goes to bed. Denies PND/Orthopnea. Denies chest pain. Appetite ok. No fever or chills. Weight at home 194-196 pounds. Taking all medications. Working full time. He dose  not smoker or drink alcohol.   Optivol- Active 8 hours per day, fluid index low no VT/V afib   Labs (9/16): K 4.2, creatinine 1.04 Labs (01/04/2015) : K 4.6 Creatinine 1.09, SPEP negative Labs (11/16): K 5.5, creatinine 1.29 Labs (02/17/2015): K 5.0 creatinine 1.2 Labs (06/02/2015): K 4.6 Creatinine 1.24, uric acid 9.9 Labs (4/17): TSH normal Labs (5/17): K 4, creatinine 1.13, BNP 187, HCT 45.1 Labs (10/17): K 4.3, creatinine 1.37 Labs (12/19/2017): K 4.7 Creatinine 1/29 Mag 2.    PMH: 1. LBBB 2. HTN: x years 3. Hyperlipidemia 4. Chronic systolic CHF: Patient was told around 2008 that his EF was about 20%.  He says that it recovered back to normal.  He thinks he may have seen a cardiologist in  at that time. On 11/10/14, he was admitted after having ventricular fibrillation versus VT arrest and being shocked by AED.   - LHC (8/16): Normal coronaries, EF < 25%. - Echo (8/16): EF 10%, diffuse hypokinesis, mild MR, severely decreased RV systolic function.  - Medtronic ICD (8/16).   - Echo (11/16): EF 20%, septal-lateral dyssynchrony, mildly dilated LV with mild LVH, moderately decreased RV systolic function.  - CPX (3/17): Peak VO2 23.1 (69% predicted peak VO2) VE/VCO2 slope: 32 OUES: 2.00 Peak RER: 1.11, Ventilatory Threshold: 17.7 (53% predicted or measured peak VO2) => Mild functional impairment. - Echo (12/17): EF  30%, mild LV dilation, septal-lateral dyssynchrony, mild to moderately decreased RV systolic function.  -ECHO 12/20/2017 EF 20-25%. Grade IDD 5. Event monitor (4/17): No atrial fibrillation.  Runs NSVT noted. 6. Myoview- 10/31 EF 19% . Massive LVE. Likely inferior wall infarct from apex to base with no ischemia Diffuse hypokinesis EF 19%    FH: Sister with heart transplant, father with cardiac arrest when about 34, 2 other sisters with "heart problems."  He has 10 siblings in all.   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  ROS: All systems reviewed and negative except as per HPI.   Current Outpatient Medications  Medication Sig Dispense Refill  . aspirin EC 81 MG tablet Take 81 mg by mouth daily.    . carvedilol (COREG) 25 MG tablet Take 1 tablet (25 mg total) by mouth 2 (two) times daily. 60 tablet 6  . colchicine 0.6 MG tablet TAKE ONE TABLET BY MOUTH ONCE DAILY AS NEEDED FOR FLARE UPS 15 tablet 0  . ENTRESTO 24-26 MG TAKE ONE TABLET BY MOUTH TWO TIMES DAILY 60 tablet 3  . eplerenone (INSPRA) 25 MG tablet TAKE ONE TABLET BY MOUTH ONCE DAILY 30 tablet 5  . furosemide (LASIX) 40 MG tablet Take 1 tablet (40 mg total) by mouth every other day. 30 tablet 0  . lovastatin (MEVACOR) 20 MG tablet Take 20 mg by mouth daily.     . magnesium oxide (MAG-OX) 400 MG tablet Take 1 tablet (400 mg total) by mouth 2 (two) times daily. 60 tablet 0  . sildenafil (REVATIO) 20 MG tablet TAKE ONE-HALF (1/2) TABLET BY MOUTH ONCEDAILY AS NEEDED FOR ERECTILE DYSFUNCTION 10 tablet 1   No current facility-administered medications for this encounter.    BP 130/78   Pulse (!) 59   Wt 89.8 kg (198 lb)   SpO2 96%   BMI 30.11 kg/m   Filed Weights   01/29/18 1008  Weight: 89.8 kg (198 lb)   Wt Readings from Last 3 Encounters:  01/29/18 89.8 kg (198 lb)  01/16/18 89.4 kg (197 lb)  01/09/18 89.4 kg (197 lb)    General:  Well appearing. No resp difficulty. Walked slowly in the clinic.  HEENT: normal Neck: supple. no JVD. Carotids 2+ bilat; no bruits. No lymphadenopathy or thryomegaly appreciated. Cor: PMI nondisplaced. Regular rate & rhythm. No rubs, r murmurs. +S3  Lungs: clear Abdomen: soft, nontender, nondistended. No hepatosplenomegaly. No bruits or masses. Good bowel sounds. Extremities: no cyanosis, clubbing, rash, edema Neuro: alert & orientedx3, cranial nerves grossly intact. moves all 4 extremities w/o difficulty. Affect pleasant   Assessment/Plan: 1. Chronic systolic CHF: Nonischemic cardiomyopathy.   Medtronic ICD.  Possibly familial CMP given father with SCD at 40, sister with heart transplant, and 2 other sisters with "heart problems."  Cannot rule out prior viral myocarditis or role for HTN.  Echo 12/17 with EF 30%, septal-lateral dyssynchrony.  CPX in 3/17 with mild functional impairment.   ECHO after VT on 12/20/2017 EF 20-25%.  - NYHA IIIB. Functional decline. I am concerned he may need advanced therapies.   Volume status stable. Continue lasix 40 mg daily.  - Continue Coreg  25 mg twice a day.  Increased may be increasing fatigue. Would like to repeat CPX down the road.    - Continue Entresto back to 24/26 bid.  Intolerant higher dose due to fatigue. Will not increase with K 5. Could consider lokelma.  - Unable to tolerate  Bidil due to headaches.  -Continue inspra 25 mg daily.  - Given possible familial CMP, recommended that Mr Merrihew siblings all be screened by echo.  He has 10 siblings. - Set up for RHC/LHC tomorrow. I am also going to check blood type today.   2. Cardiac arrest: Now has Medtronic ICD 2016 .   3. NSVT:   4. VT: 10/2 with appropriate ICD shock for VT - No driving for 6 months.  Sent for stress test on 10/30 with high risk results.-->Likely inferior wall infarct from apex to base with no ischemia Diffuse hypokinesis EF 19%. Set up for [facility] tomorrow.   Follow up tomorrow for RHC/LHC. We discussed the procedure and he is agreeable. I am also going to check blood type today. I am concerned he is moving towards advanced therapies. Check pre cath labs today and blood type. I also told him that he may require hospital base on cath results. Hold off on CPX for now.  Follow up in 3 weeks.   Jermain Curt NP-C  01/29/2018

## 2018-01-29 NOTE — Patient Instructions (Signed)
  Your physician recommends that you schedule a follow-up appointment in: 3 weeks  in the Advanced Practitioners (PA/NP) Clinic     [facility] [address] [id number] [facility] [place] [address] Dept: [phone] Loc: [phone]  Brian Noble  01/29/2018  You are scheduled for a Cardiac Catheterization on Wednesday, November 13 with [name].  1. Please arrive at the [facility] (Main Entrance A) at [facility]: [address] [place], [place] [address] at 6:30 AM (This time is two hours before your procedure to ensure your preparation). Free valet parking service is available.   Special note: Every effort is made to have your procedure done on time. Please understand that emergencies sometimes delay scheduled procedures.  2. Diet: Do not eat solid foods after midnight.  The patient may have clear liquids until 5am upon the day of the procedure.  3. Labs: pre cath labs done 01/29/18  4. Medication instructions in preparation for your procedure:   Contrast Allergy: No     Stop taking, Lasix (Furosemide)  Wednesday, November 13,    On the morning of your procedure, take your Aspirin and any morning medicines NOT listed above.  You may use sips of water.  5. Plan for one night stay--bring personal belongings. 6. Bring a current list of your medications and current insurance cards. 7. You MUST have a responsible person to drive you home. 8. Someone MUST be with you the first 24 hours after you arrive home or your discharge will be delayed. 9. Please wear clothes that are easy to get on and off and wear slip-on shoes.  Thank you for allowing [place] to care for you!   -- [facility]

## 2018-01-29 NOTE — Progress Notes (Signed)
Patient ID: Brian Noble, male   DOB: 06/23/1960, 57 y.o.   MRN: [id number]   PCP: Dr. Perry Cardiology: Dr McLean EP Dr Klein   57 yo with a prior history of cardiomyopathy who developed a cardiac arrest and was found to have low EF and normal coronaries.  Per patient, he was found to have low EF around 20% about 8 years ago.  He was seen by a cardiologist in [place] (he thinks), and EF went back to normal range.  He had no problems up until 8/16.  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  He was noted to pass out by family.  EMS was called and arrived quickly.  He was in either ventricular fibrillation or VT and was defibrillated.  He was cooled and sent for coronary angiography, which showed no significant coronary disease.  Echo showed EF 10% with severe RV dysfunction.  After recovery, he got a Medtronic ICD.  Repeat echo in 11/16 showed EF 20% with septal-lateral dyssynchrony, moderately decreased RV systolic function.  Unable to take Bidil due to severe headaches.    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 Appropriate shock due to VT. ECHO completed and showed EF 20-25% and normal RV. Carvedilol increased to 18.75 mg twice a day. He was discharged on 12/20/17. He had follow up with Amber Seiler EP NP and carvedilol was increased to 25 mg twice a day.   He sent for stress on 01/16/18. High risk with mass and likely inferior wall infarct from apex to base with no ischemia and diffuse hypokinesis EF 19%.   Today he returns for HF follow up with his wife.Since his hospitalization he has had increased shortness of breath. SOB on flat ground and now has to stop walking up steps. This is a significant change for him.  Overall complaining of fatigue. He has been going to work 6 days a week but when gets home he goes to bed. Denies PND/Orthopnea. Denies chest pain. Appetite ok. No fever or chills. Weight at home 194-196 pounds. Taking all medications. Working full time. He dose  not smoker or drink alcohol.   Optivol- Active 8 hours per day, fluid index low no VT/V afib   Labs (9/16): K 4.2, creatinine 1.04 Labs (01/04/2015) : K 4.6 Creatinine 1.09, SPEP negative Labs (11/16): K 5.5, creatinine 1.29 Labs (02/17/2015): K 5.0 creatinine 1.2 Labs (06/02/2015): K 4.6 Creatinine 1.24, uric acid 9.9 Labs (4/17): TSH normal Labs (5/17): K 4, creatinine 1.13, BNP 187, HCT 45.1 Labs (10/17): K 4.3, creatinine 1.37 Labs (12/19/2017): K 4.7 Creatinine 1/29 Mag 2.    PMH: 1. LBBB 2. HTN: x years 3. Hyperlipidemia 4. Chronic systolic CHF: Patient was told around 2008 that his EF was about 20%.  He says that it recovered back to normal.  He thinks he may have seen a cardiologist in [place] at that time. On 11/10/14, he was admitted after having ventricular fibrillation versus VT arrest and being shocked by AED.   - LHC (8/16): Normal coronaries, EF < 25%. - Echo (8/16): EF 10%, diffuse hypokinesis, mild MR, severely decreased RV systolic function.  - Medtronic ICD (8/16).   - Echo (11/16): EF 20%, septal-lateral dyssynchrony, mildly dilated LV with mild LVH, moderately decreased RV systolic function.  - CPX (3/17): Peak VO2 23.1 (69% predicted peak VO2) VE/VCO2 slope: 32 OUES: 2.00 Peak RER: 1.11, Ventilatory Threshold: 17.7 (53% predicted or measured peak VO2) => Mild functional impairment. - Echo (12/17): EF   30%, mild LV dilation, septal-lateral dyssynchrony, mild to moderately decreased RV systolic function.  -ECHO 12/20/2017 EF 20-25%. Grade IDD 5. Event monitor (4/17): No atrial fibrillation.  Runs NSVT noted. 6. Myoview- 10/31 EF 19% . Massive LVE. Likely inferior wall infarct from apex to base with no ischemia Diffuse hypokinesis EF 19%    FH: Sister with heart transplant, father with cardiac arrest when about 40, 2 other sisters with "heart problems."  He has 10 siblings in all.   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  ROS: All systems reviewed and negative except as per HPI.   Current Outpatient Medications  Medication Sig Dispense Refill  . aspirin EC 81 MG tablet Take 81 mg by mouth daily.    . carvedilol (COREG) 25 MG tablet Take 1 tablet (25 mg total) by mouth 2 (two) times daily. 60 tablet 6  . colchicine 0.6 MG tablet TAKE ONE TABLET BY MOUTH ONCE DAILY AS NEEDED FOR FLARE UPS 15 tablet 0  . ENTRESTO 24-26 MG TAKE ONE TABLET BY MOUTH TWO TIMES DAILY 60 tablet 3  . eplerenone (INSPRA) 25 MG tablet TAKE ONE TABLET BY MOUTH ONCE DAILY 30 tablet 5  . furosemide (LASIX) 40 MG tablet Take 1 tablet (40 mg total) by mouth every other day. 30 tablet 0  . lovastatin (MEVACOR) 20 MG tablet Take 20 mg by mouth daily.     . magnesium oxide (MAG-OX) 400 MG tablet Take 1 tablet (400 mg total) by mouth 2 (two) times daily. 60 tablet 0  . sildenafil (REVATIO) 20 MG tablet TAKE ONE-HALF (1/2) TABLET BY MOUTH ONCEDAILY AS NEEDED FOR ERECTILE DYSFUNCTION 10 tablet 1   No current facility-administered medications for this encounter.    BP 130/78   Pulse (!) 59   Wt 89.8 kg (198 lb)   SpO2 96%   BMI 30.11 kg/m   Filed Weights   01/29/18 1008  Weight: 89.8 kg (198 lb)   Wt Readings from Last 3 Encounters:  01/29/18 89.8 kg (198 lb)  01/16/18 89.4 kg (197 lb)  01/09/18 89.4 kg (197 lb)    General:  Well appearing. No resp difficulty. Walked slowly in the clinic.  HEENT: normal Neck: supple. no JVD. Carotids 2+ bilat; no bruits. No lymphadenopathy or thryomegaly appreciated. Cor: PMI nondisplaced. Regular rate & rhythm. No rubs, r murmurs. +S3  Lungs: clear Abdomen: soft, nontender, nondistended. No hepatosplenomegaly. No bruits or masses. Good bowel sounds. Extremities: no cyanosis, clubbing, rash, edema Neuro: alert & orientedx3, cranial nerves grossly intact. moves all 4 extremities w/o difficulty. Affect pleasant   Assessment/Plan: 1. Chronic systolic CHF: Nonischemic cardiomyopathy.   Medtronic ICD.  Possibly familial CMP given father with SCD at 40, sister with heart transplant, and 2 other sisters with "heart problems."  Cannot rule out prior viral myocarditis or role for HTN.  Echo 12/17 with EF 30%, septal-lateral dyssynchrony.  CPX in 3/17 with mild functional impairment.   ECHO after VT on 12/20/2017 EF 20-25%.  - NYHA IIIB. Functional decline. I am concerned he may need advanced therapies.   Volume status stable. Continue lasix 40 mg daily.  - Continue Coreg  25 mg twice a day.  Increased may be increasing fatigue. Would like to repeat CPX down the road.    - Continue Entresto back to 24/26 bid.  Intolerant higher dose due to fatigue. Will not increase with K 5. Could consider lokelma.  - Unable to tolerate   Bidil due to headaches.  -Continue inspra 25 mg daily.  - Given possible familial CMP, recommended that Mr Matar's siblings all be screened by echo.  He has 10 siblings. - Set up for RHC/LHC tomorrow. I am also going to check blood type today.   2. Cardiac arrest: Now has Medtronic ICD 2016 .   3. NSVT:   4. VT: 10/2 with appropriate ICD shock for VT - No driving for 6 months.  Sent for stress test on 10/30 with high risk results.-->Likely inferior wall infarct from apex to base with no ischemia Diffuse hypokinesis EF 19%. Set up for LHC tomorrow.   Follow up tomorrow for RHC/LHC. We discussed the procedure and he is agreeable. I am also going to check blood type today. I am concerned he is moving towards advanced therapies. Check pre cath labs today and blood type. I also told him that he may require hospital base on cath results. Hold off on CPX for now.  Follow up in 3 weeks.   Amy Clegg NP-C  01/29/2018      

## 2018-01-30 ENCOUNTER — Other Ambulatory Visit: Payer: Self-pay

## 2018-01-30 ENCOUNTER — Encounter (HOSPITAL_COMMUNITY): Payer: Self-pay | Admitting: Cardiology

## 2018-01-30 ENCOUNTER — Ambulatory Visit (HOSPITAL_COMMUNITY)
Admission: RE | Admit: 2018-01-30 | Discharge: 2018-01-30 | Disposition: A | Discharge status: Home / Self Care | Payer: BLUE CROSS/BLUE SHIELD | Source: Ambulatory Visit | Attending: Cardiology | Admitting: Cardiology

## 2018-01-30 ENCOUNTER — Encounter (HOSPITAL_COMMUNITY)
Admission: RE | Disposition: A | Discharge status: Home / Self Care | Payer: Self-pay | Source: Ambulatory Visit | Attending: Cardiology

## 2018-01-30 DIAGNOSIS — I447 Left bundle-branch block, unspecified: Secondary | ICD-10-CM | POA: Insufficient documentation

## 2018-01-30 DIAGNOSIS — I428 Other cardiomyopathies: Secondary | ICD-10-CM | POA: Diagnosis not present

## 2018-01-30 DIAGNOSIS — I110 Hypertensive heart disease with heart failure: Secondary | ICD-10-CM | POA: Insufficient documentation

## 2018-01-30 DIAGNOSIS — I472 Ventricular tachycardia: Secondary | ICD-10-CM | POA: Insufficient documentation

## 2018-01-30 DIAGNOSIS — E785 Hyperlipidemia, unspecified: Secondary | ICD-10-CM | POA: Insufficient documentation

## 2018-01-30 DIAGNOSIS — Z8249 Family history of ischemic heart disease and other diseases of the circulatory system: Secondary | ICD-10-CM | POA: Insufficient documentation

## 2018-01-30 DIAGNOSIS — Z95810 Presence of automatic (implantable) cardiac defibrillator: Secondary | ICD-10-CM | POA: Insufficient documentation

## 2018-01-30 DIAGNOSIS — Z8674 Personal history of sudden cardiac arrest: Secondary | ICD-10-CM | POA: Insufficient documentation

## 2018-01-30 DIAGNOSIS — Z7982 Long term (current) use of aspirin: Secondary | ICD-10-CM | POA: Insufficient documentation

## 2018-01-30 DIAGNOSIS — R5383 Other fatigue: Secondary | ICD-10-CM | POA: Insufficient documentation

## 2018-01-30 DIAGNOSIS — I509 Heart failure, unspecified: Secondary | ICD-10-CM | POA: Diagnosis not present

## 2018-01-30 DIAGNOSIS — Z79899 Other long term (current) drug therapy: Secondary | ICD-10-CM | POA: Insufficient documentation

## 2018-01-30 DIAGNOSIS — I5022 Chronic systolic (congestive) heart failure: Secondary | ICD-10-CM | POA: Diagnosis not present

## 2018-01-30 HISTORY — PX: RIGHT HEART CATH AND CORONARY ANGIOGRAPHY: CATH118264

## 2018-01-30 HISTORY — PX: ULTRASOUND GUIDANCE FOR VASCULAR ACCESS: SHX6516

## 2018-01-30 LAB — POCT I-STAT 3, VENOUS BLOOD GAS (G3P V)
Acid-base deficit: 2 mmol/L (ref 0.0–2.0)
Bicarbonate: 24.6 mmol/L (ref 20.0–28.0)
Bicarbonate: 27.2 mmol/L (ref 20.0–28.0)
O2 Saturation: 69 %
O2 Saturation: 69 %
TCO2: 26 mmol/L (ref 22–32)
TCO2: 29 mmol/L (ref 22–32)
pCO2, Ven: 47 mmHg (ref 44.0–60.0)
pCO2, Ven: 51.1 mmHg (ref 44.0–60.0)
pH, Ven: 7.327 (ref 7.250–7.430)
pH, Ven: 7.335 (ref 7.250–7.430)
pO2, Ven: 39 mmHg (ref 32.0–45.0)
pO2, Ven: 39 mmHg (ref 32.0–45.0)

## 2018-01-30 SURGERY — ULTRASOUND GUIDANCE, FOR VASCULAR ACCESS

## 2018-01-30 MED ORDER — SODIUM CHLORIDE 0.9 % IV SOLN
INTRAVENOUS | Status: DC
  Administered 2018-01-30: 08:00:00 via INTRAVENOUS

## 2018-01-30 MED ORDER — SODIUM CHLORIDE 0.9% FLUSH: 3.0000 mL | Freq: Two times a day (BID) | INTRAVENOUS | Status: DC

## 2018-01-30 MED ORDER — ACETAMINOPHEN 325 MG PO TABS: 650.0000 mg | ORAL_TABLET | ORAL | Status: DC | PRN

## 2018-01-30 MED ORDER — SODIUM CHLORIDE 0.9 % IV SOLN: 250.0000 mL | INTRAVENOUS | Status: DC | PRN

## 2018-01-30 MED ORDER — ONDANSETRON HCL 4 MG/2ML IJ SOLN: 4.0000 mg | Freq: Four times a day (QID) | INTRAMUSCULAR | Status: DC | PRN

## 2018-01-30 MED ORDER — HEPARIN (PORCINE) IN NACL 1000-0.9 UT/500ML-% IV SOLN
INTRAVENOUS | Status: DC | PRN
  Administered 2018-01-30 (×2): 500 mL

## 2018-01-30 MED ORDER — FENTANYL CITRATE (PF) 100 MCG/2ML IJ SOLN
INTRAMUSCULAR | Status: AC
  Filled 2018-01-30: qty 2

## 2018-01-30 MED ORDER — IOHEXOL 350 MG/ML SOLN
INTRAVENOUS | Status: DC | PRN
  Administered 2018-01-30: 75 mL via INTRAVENOUS

## 2018-01-30 MED ORDER — MIDAZOLAM HCL 2 MG/2ML IJ SOLN
INTRAMUSCULAR | Status: AC
  Filled 2018-01-30: qty 2

## 2018-01-30 MED ORDER — MIDAZOLAM HCL 2 MG/2ML IJ SOLN
INTRAMUSCULAR | Status: DC | PRN
  Administered 2018-01-30 (×2): 1 mg via INTRAVENOUS

## 2018-01-30 MED ORDER — HEPARIN SODIUM (PORCINE) 1000 UNIT/ML IJ SOLN
INTRAMUSCULAR | Status: DC | PRN
  Administered 2018-01-30: 4500 [IU] via INTRAVENOUS

## 2018-01-30 MED ORDER — FENTANYL CITRATE (PF) 100 MCG/2ML IJ SOLN
INTRAMUSCULAR | Status: DC | PRN
  Administered 2018-01-30 (×2): 25 ug via INTRAVENOUS

## 2018-01-30 MED ORDER — LIDOCAINE HCL (PF) 1 % IJ SOLN
INTRAMUSCULAR | Status: DC | PRN
  Administered 2018-01-30 (×2): 2 mL
  Administered 2018-01-30: 1 mL

## 2018-01-30 MED ORDER — VERAPAMIL HCL 2.5 MG/ML IV SOLN
INTRAVENOUS | Status: AC
  Filled 2018-01-30: qty 2

## 2018-01-30 MED ORDER — SODIUM CHLORIDE 0.9% FLUSH: 3.0000 mL | INTRAVENOUS | Status: DC | PRN

## 2018-01-30 MED ORDER — SODIUM CHLORIDE 0.9 % IV SOLN: INTRAVENOUS | Status: DC

## 2018-01-30 MED ORDER — HEPARIN (PORCINE) IN NACL 1000-0.9 UT/500ML-% IV SOLN
INTRAVENOUS | Status: AC
  Filled 2018-01-30: qty 1000

## 2018-01-30 MED ORDER — LIDOCAINE HCL (PF) 1 % IJ SOLN
INTRAMUSCULAR | Status: AC
  Filled 2018-01-30: qty 30

## 2018-01-30 MED ORDER — ASPIRIN 81 MG PO CHEW: 81.0000 mg | CHEWABLE_TABLET | ORAL | Status: DC

## 2018-01-30 MED ORDER — VERAPAMIL HCL 2.5 MG/ML IV SOLN
INTRAVENOUS | Status: DC | PRN
  Administered 2018-01-30: 10 mL via INTRA_ARTERIAL

## 2018-01-30 SURGICAL SUPPLY — 12 items
CATH 5FR JL3.5 JR4 ANG PIG MP (CATHETERS) ×3 IMPLANT
CATH BALLN WEDGE 5F 110CM (CATHETERS) ×3 IMPLANT
DEVICE RAD COMP TR BAND LRG (VASCULAR PRODUCTS) ×3 IMPLANT
GLIDESHEATH SLEND SS 6F .021 (SHEATH) ×3 IMPLANT
GUIDEWIRE INQWIRE 1.5J.035X260 (WIRE) ×2 IMPLANT
INQWIRE 1.5J .035X260CM (WIRE) ×3
KIT HEART LEFT (KITS) ×3 IMPLANT
PACK CARDIAC CATHETERIZATION (CUSTOM PROCEDURE TRAY) ×3 IMPLANT
SHEATH GLIDE SLENDER 4/5FR (SHEATH) ×3 IMPLANT
SHEATH PROBE COVER 6X72 (BAG) ×3 IMPLANT
TRANSDUCER W/STOPCOCK (MISCELLANEOUS) ×3 IMPLANT
TUBING CIL FLEX 10 FLL-RA (TUBING) ×3 IMPLANT

## 2018-01-30 NOTE — Discharge Instructions (Signed)

## 2018-01-30 NOTE — Interval H&P Note (Signed)
History and Physical Interval Note:  01/30/2018 8:39 AM  Brian Noble  has presented today for surgery, with the diagnosis of chf  The various methods of treatment have been discussed with the patient and family. After consideration of risks, benefits and other options for treatment, the patient has consented to  Procedure(s): RIGHT/LEFT HEART CATH AND CORONARY ANGIOGRAPHY (N/A) as a surgical intervention .  The patient's history has been reviewed, patient examined, no change in status, stable for surgery.  I have reviewed the patient's chart and labs.  Questions were answered to the patient's satisfaction.     Brian Noble [employer]

## 2018-02-01 ENCOUNTER — Telehealth (HOSPITAL_COMMUNITY): Payer: Self-pay | Admitting: Cardiology

## 2018-02-01 NOTE — Telephone Encounter (Signed)
S/p cath per Dr McLean/Amy Clegg,NP Patient will need a CPX and follow up with Dr Shirlee Latch in 2-3 weeks.   Left message for patient to return call and schedule appointments

## 2018-02-04 NOTE — Telephone Encounter (Signed)
cpx scheduled for 11/27 Unable to work in an appt with Dr Shirlee Latch Will keep follow up with Amy Clegg,NP on 12/3 which is also a day Dr Shirlee Latch is in clinic

## 2018-02-12 ENCOUNTER — Ambulatory Visit (INDEPENDENT_AMBULATORY_CARE_PROVIDER_SITE_OTHER): Payer: BLUE CROSS/BLUE SHIELD

## 2018-02-12 ENCOUNTER — Telehealth: Payer: Self-pay | Admitting: Cardiology

## 2018-02-12 DIAGNOSIS — I428 Other cardiomyopathies: Secondary | ICD-10-CM

## 2018-02-12 NOTE — Telephone Encounter (Signed)
LMOVM reminding pt to send remote transmission.   

## 2018-02-13 ENCOUNTER — Other Ambulatory Visit (HOSPITAL_COMMUNITY): Payer: Self-pay | Admitting: *Deleted

## 2018-02-13 ENCOUNTER — Encounter: Payer: Self-pay | Admitting: Cardiology

## 2018-02-13 ENCOUNTER — Ambulatory Visit (HOSPITAL_COMMUNITY): Payer: BLUE CROSS/BLUE SHIELD | Attending: Internal Medicine

## 2018-02-13 DIAGNOSIS — I5022 Chronic systolic (congestive) heart failure: Secondary | ICD-10-CM | POA: Diagnosis not present

## 2018-02-13 NOTE — Progress Notes (Signed)
Remote ICD transmission.   

## 2018-02-19 ENCOUNTER — Ambulatory Visit (HOSPITAL_COMMUNITY)
Admission: RE | Admit: 2018-02-19 | Discharge: 2018-02-19 | Disposition: A | Discharge status: Home / Self Care | Payer: BLUE CROSS/BLUE SHIELD | Source: Ambulatory Visit | Attending: Cardiology | Admitting: Cardiology

## 2018-02-19 ENCOUNTER — Encounter (HOSPITAL_COMMUNITY): Payer: Self-pay

## 2018-02-19 VITALS — BP 148/102 | HR 70 | Wt 201.0 lb

## 2018-02-19 DIAGNOSIS — I110 Hypertensive heart disease with heart failure: Secondary | ICD-10-CM | POA: Diagnosis not present

## 2018-02-19 DIAGNOSIS — I5022 Chronic systolic (congestive) heart failure: Secondary | ICD-10-CM | POA: Diagnosis not present

## 2018-02-19 DIAGNOSIS — Z7982 Long term (current) use of aspirin: Secondary | ICD-10-CM | POA: Insufficient documentation

## 2018-02-19 DIAGNOSIS — Z79899 Other long term (current) drug therapy: Secondary | ICD-10-CM | POA: Insufficient documentation

## 2018-02-19 DIAGNOSIS — E785 Hyperlipidemia, unspecified: Secondary | ICD-10-CM | POA: Insufficient documentation

## 2018-02-19 DIAGNOSIS — I469 Cardiac arrest, cause unspecified: Secondary | ICD-10-CM | POA: Diagnosis not present

## 2018-02-19 DIAGNOSIS — I472 Ventricular tachycardia, unspecified: Secondary | ICD-10-CM

## 2018-02-19 DIAGNOSIS — I428 Other cardiomyopathies: Secondary | ICD-10-CM | POA: Diagnosis not present

## 2018-02-19 DIAGNOSIS — I10 Essential (primary) hypertension: Secondary | ICD-10-CM

## 2018-02-19 DIAGNOSIS — I4729 Other ventricular tachycardia: Secondary | ICD-10-CM

## 2018-02-19 NOTE — Progress Notes (Signed)
Patient ID: Brian Noble, male   DOB: 11/04/1960, 57 y.o.   MRN: [id number]   PCP: Dr. Marina Goodell Cardiology: Dr Shirlee Latch EP Dr Graciela Husbands   79 yo with a prior history of cardiomyopathy who developed a cardiac arrest and was found to have low EF and normal coronaries.  Per patient, he was found to have low EF around 20% about 8 years ago.  He was seen by a cardiologist in [place] (he thinks), and EF went back to normal range.  He had no problems up until 8/16.  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  He was noted to pass out by family.  EMS was called and arrived quickly.  He was in either ventricular fibrillation or VT and was defibrillated.  He was cooled and sent for coronary angiography, which showed no significant coronary disease.  Echo showed EF 10% with severe RV dysfunction.  After recovery, he got a Medtronic ICD.  Repeat echo in 11/16 showed EF 20% with septal-lateral dyssynchrony, moderately decreased RV systolic function.  Unable to take Bidil due to severe headaches.    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 Appropriate shock due to VT. ECHO completed and showed EF 20-25% and normal RV. Carvedilol increased to 18.75 mg twice a day. He was discharged on 12/20/17. He had follow up with [name] EP NP and carvedilol was increased to 25 mg twice a day.   He sent for stress on 01/16/18. High risk with mass and likely inferior wall infarct from apex to base with no ischemia and diffuse hypokinesis EF 19%.   Today he returns for HF follow up. Complaining of fatigue. SOB with steps. Denies PND/Orthopnea. Appetite ok. No fever or chills. Weight at home trending up 198-200  pounds. Taking all medications. He continues to work full time. Lives with his wife.   Optivol- No VT. No AF. Activity 6 hours per day.   Labs (9/16): K 4.2, creatinine 1.04 Labs (01/04/2015) : K 4.6 Creatinine 1.09, SPEP negative Labs (11/16): K 5.5, creatinine 1.29 Labs (02/17/2015): K 5.0  creatinine 1.2 Labs (06/02/2015): K 4.6 Creatinine 1.24, uric acid 9.9 Labs (4/17): TSH normal Labs (5/17): K 4, creatinine 1.13, BNP 187, HCT 45.1 Labs (10/17): K 4.3, creatinine 1.37 Labs (12/19/2017): K 4.7 Creatinine 1/29 Mag 2.  Labs 01/29/2018: K 5.1 Creatinine 1.11 BNP 244 Hgb 14.6     PMH: 1. LBBB 2. HTN: x years 3. Hyperlipidemia 4. Chronic systolic CHF: Patient was told around 2008 that his EF was about 20%.  He says that it recovered back to normal.  He thinks he may have seen a cardiologist in [place] at that time. On 11/10/14, he was admitted after having ventricular fibrillation versus VT arrest and being shocked by AED.   - LHC (8/16): Normal coronaries, EF < 25%. - Echo (8/16): EF 10%, diffuse hypokinesis, mild MR, severely decreased RV systolic function.  - Medtronic ICD (8/16).   - Echo (11/16): EF 20%, septal-lateral dyssynchrony, mildly dilated LV with mild LVH, moderately decreased RV systolic function.  - CPX (3/17): Peak VO2 23.1 (69% predicted peak VO2) VE/VCO2 slope: 32 OUES: 2.00 Peak RER: 1.11, Ventilatory Threshold: 17.7 (53% predicted or measured peak VO2) => Mild functional impairment. - Echo (12/17): EF 30%, mild LV dilation, septal-lateral dyssynchrony, mild to moderately decreased RV systolic function.  -ECHO 12/20/2017 EF 20-25%. Grade IDD -01/2018 RHC RA mean 3,RV 25/2, PA 24/10, mean 16, PCWP mean 7, AO 106/67, Oxygen saturations:PA 69%  AO 98% Cardiac Output (Fick) 4.68  Cardiac Index (Fick) 2.31 5. Event monitor (4/17): No atrial fibrillation.  Runs NSVT noted. 6. Myoview- 10/31 EF 19% . Massive LVE. Likely inferior wall infarct from apex to base with no ischemia Diffuse hypokinesis EF 19% 7. CPX 01/2018- Submaximal test without any clear evidence of cardiopulmonary limitation. Given normal submax parameters doubt there is a significant cardiac limitation here. Suspect deconditioning may be major source of limitation. Consider exercise rehab program with  retesting in 3-6 months.     FH: Sister with heart transplant, father with cardiac arrest when about 90, 2 other sisters with "heart problems."  He has 10 siblings in all.   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  ROS: All systems reviewed and negative except as per HPI.   Current Outpatient Medications  Medication Sig Dispense Refill  . aspirin EC 81 MG tablet Take 81 mg by mouth daily.    . carvedilol (COREG) 25 MG tablet Take 1 tablet (25 mg total) by mouth 2 (two) times daily. 60 tablet 6  . colchicine 0.6 MG tablet TAKE ONE TABLET BY MOUTH ONCE DAILY AS NEEDED FOR FLARE UPS (Patient taking differently: Take 0.6 mg by mouth as needed (for gout flare ups). ) 15 tablet 0  . ENTRESTO 24-26 MG TAKE ONE TABLET BY MOUTH TWO TIMES DAILY (Patient taking differently: Take 1 tablet by mouth 2 (two) times daily. ) 60 tablet 3  . eplerenone (INSPRA) 25 MG tablet TAKE ONE TABLET BY MOUTH ONCE DAILY (Patient taking differently: Take 25 mg by mouth daily. ) 30 tablet 5  . furosemide (LASIX) 40 MG tablet Take 1 tablet (40 mg total) by mouth every other day. 30 tablet 0  . lovastatin (MEVACOR) 20 MG tablet Take 20 mg by mouth daily.     . magnesium oxide (MAG-OX) 400 MG tablet Take 1 tablet (400 mg total) by mouth 2 (two) times daily. 60 tablet 0  . sildenafil (REVATIO) 20 MG tablet TAKE ONE-HALF (1/2) TABLET BY MOUTH ONCEDAILY AS NEEDED FOR ERECTILE DYSFUNCTION (Patient taking differently: Take 10 mg by mouth as needed (for ED). ) 10 tablet 1   No current facility-administered medications for this encounter.    BP (!) 148/102   Pulse 70   Wt 91.2 kg (201 lb)   SpO2 97%   BMI 30.56 kg/m   Filed Weights   02/19/18 0858  Weight: 91.2 kg (201 lb)   Wt Readings from Last 3 Encounters:  02/19/18 91.2 kg (201 lb)  01/30/18 88.9 kg (196 lb)  01/29/18 89.8 kg (198 lb)   General:  Well appearing. No resp difficulty HEENT: normal Neck: supple. no JVD.  Carotids 2+ bilat; no bruits. No lymphadenopathy or thryomegaly appreciated. Cor: PMI nondisplaced. Regular rate & rhythm. No rubs, gallops or murmurs. Lungs: clear Abdomen: soft, nontender, nondistended. No hepatosplenomegaly. No bruits or masses. Good bowel sounds. Extremities: no cyanosis, clubbing, rash, edema Neuro: alert & orientedx3, cranial nerves grossly intact. moves all 4 extremities w/o difficulty. Affect pleasant   Assessment/Plan: 1. Chronic systolic CHF: Nonischemic cardiomyopathy.  Medtronic ICD.  Possibly familial CMP given father with SCD at 65, sister with heart transplant, and 2 other sisters with "heart problems."  Cannot rule out prior viral myocarditis or role for HTN.  Echo 12/17 with EF 30%, septal-lateral dyssynchrony.  CPX in 3/17 with mild functional impairment. CXP repeat 01/2018 with suboptimal effort and minimal cardiac impairment. Suggestive of  deconditioning. We discussed cardiac rehab but he does not want to pursue right now.  ECHO after VT on 12/20/2017 EF 20-25%.  Had RHC 01/2018 with cardioac output and cardia  - NYHA IIIB. Volume status stable. Continue lasix 40 mg daily.  - Continue Coreg  25 mg twice a day. - Continue Entresto back to 24/26 bid.  Intolerant higher dose due to fatigue. Will not increase with K 5. Could consider lokelma.  - Unable to tolerate Bidil due to headaches.  -Continue inspra 25 mg daily.  - Given possible familial CMP, recommended that Mr Raz siblings all be screened by echo.  He has 10 siblings. -2. Cardiac arrest: Now has Medtronic ICD 2016 .   3. NSVT:   4. VT: 10/2 with appropriate ICD shock for VT - No driving for 6 months.  Sent for stress test on 10/30 with high risk results.-->Likely inferior wall infarct from apex to base with no ischemia Diffuse hypokinesis EF 19%.  LHC 01/2018 with no significant coronary disease.     Greater than 50% of the (total minutes 25) visit spent in counseling/coordination of care  regarding test results.    Aubre Quincy NP-C  02/19/2018

## 2018-02-19 NOTE — Patient Instructions (Signed)
It was great to see you today! No medication changes are needed at this time.  Your physician recommends that you schedule a follow-up appointment in: 8 weeks  in the [facility]    Do the following things EVERYDAY: 1) Weigh yourself in the morning before breakfast. Write it down and keep it in a log. 2) Take your medicines as prescribed 3) Eat low salt foods-Limit salt (sodium) to 2000 mg per day.  4) Stay as active as you can everyday 5) Limit all fluids for the day to less than 2 liters  At the [facility], you and your health needs are our priority. As part of our continuing mission to provide you with exceptional heart care, we have created designated Provider Care Teams. These Care Teams include your primary Cardiologist (physician) and Advanced Practice Providers (APPs- Physician Assistants and Nurse Practitioners) who all work together to provide you with the care you need, when you need it.   You may see any of the following providers on your designated Care Team at your next follow up: Marland Kitchen Dr Arvilla Meres . Dr Marca Ancona . Tonye Becket, NP . Duwaine Maxin, NP . Joanell Rising

## 2018-02-27 ENCOUNTER — Other Ambulatory Visit (HOSPITAL_COMMUNITY): Payer: Self-pay | Admitting: Internal Medicine

## 2018-03-08 ENCOUNTER — Other Ambulatory Visit: Payer: Self-pay | Admitting: Internal Medicine

## 2018-04-05 LAB — CUP PACEART REMOTE DEVICE CHECK
Battery Remaining Longevity: 110 mo
Battery Voltage: 3.01 V
Brady Statistic RV Percent Paced: 0.04 %
Date Time Interrogation Session: 20191127025540
HighPow Impedance: 72 Ohm
Implantable Lead Implant Date: 20160829
Implantable Lead Location: 753860
Implantable Pulse Generator Implant Date: 20160829
Lead Channel Impedance Value: 380 Ohm
Lead Channel Impedance Value: 456 Ohm
Lead Channel Pacing Threshold Amplitude: 0.625 V
Lead Channel Pacing Threshold Pulse Width: 0.4 ms
Lead Channel Sensing Intrinsic Amplitude: 26.125 mV
Lead Channel Sensing Intrinsic Amplitude: 26.125 mV
Lead Channel Setting Pacing Amplitude: 2 V
Lead Channel Setting Pacing Pulse Width: 0.4 ms
Lead Channel Setting Sensing Sensitivity: 0.3 mV

## 2018-04-09 ENCOUNTER — Telehealth (HOSPITAL_COMMUNITY): Payer: Self-pay | Admitting: Cardiology

## 2018-04-09 NOTE — Telephone Encounter (Signed)
Left message for patient to call back.  Need to reschedule 04/26/2018 appt with Dr. Shirlee Latch.  Dr. Shirlee Latch will see pts on 04/24/2018 and 04/25/2018

## 2018-04-11 NOTE — Telephone Encounter (Signed)
Called and left 2nd message for pt to call back.  Need to reschedule appt on 04/26/2018 with Dr. Shirlee Latch to 04/24/2018 or 04/25/2018.

## 2018-04-11 NOTE — Telephone Encounter (Signed)
Patient's wife returned the call.  She rescheduled pt's appt to 04/25/2018.

## 2018-04-25 ENCOUNTER — Ambulatory Visit (HOSPITAL_COMMUNITY)
Admission: RE | Admit: 2018-04-25 | Discharge: 2018-04-25 | Disposition: A | Discharge status: Home / Self Care | Payer: BLUE CROSS/BLUE SHIELD | Source: Ambulatory Visit | Attending: Cardiology | Admitting: Cardiology

## 2018-04-25 VITALS — BP 148/82 | HR 67 | Wt 199.4 lb

## 2018-04-25 DIAGNOSIS — I472 Ventricular tachycardia, unspecified: Secondary | ICD-10-CM

## 2018-04-25 DIAGNOSIS — Z7982 Long term (current) use of aspirin: Secondary | ICD-10-CM | POA: Diagnosis not present

## 2018-04-25 DIAGNOSIS — E785 Hyperlipidemia, unspecified: Secondary | ICD-10-CM | POA: Diagnosis not present

## 2018-04-25 DIAGNOSIS — I5022 Chronic systolic (congestive) heart failure: Secondary | ICD-10-CM | POA: Diagnosis not present

## 2018-04-25 DIAGNOSIS — Z95810 Presence of automatic (implantable) cardiac defibrillator: Secondary | ICD-10-CM | POA: Diagnosis not present

## 2018-04-25 DIAGNOSIS — N529 Male erectile dysfunction, unspecified: Secondary | ICD-10-CM | POA: Insufficient documentation

## 2018-04-25 DIAGNOSIS — Z79899 Other long term (current) drug therapy: Secondary | ICD-10-CM | POA: Insufficient documentation

## 2018-04-25 DIAGNOSIS — R5383 Other fatigue: Secondary | ICD-10-CM | POA: Insufficient documentation

## 2018-04-25 DIAGNOSIS — I428 Other cardiomyopathies: Secondary | ICD-10-CM | POA: Diagnosis not present

## 2018-04-25 DIAGNOSIS — Z8249 Family history of ischemic heart disease and other diseases of the circulatory system: Secondary | ICD-10-CM | POA: Insufficient documentation

## 2018-04-25 DIAGNOSIS — I110 Hypertensive heart disease with heart failure: Secondary | ICD-10-CM | POA: Insufficient documentation

## 2018-04-25 LAB — BASIC METABOLIC PANEL
Anion gap: 14 (ref 5–15)
BUN: 13 mg/dL (ref 6–20)
CO2: 23 mmol/L (ref 22–32)
Calcium: 9.6 mg/dL (ref 8.9–10.3)
Chloride: 104 mmol/L (ref 98–111)
Creatinine, Ser: 1.1 mg/dL (ref 0.61–1.24)
GFR calc Af Amer: >60 mL/min (ref >60)
GFR calc non Af Amer: >60 mL/min (ref >60)
Glucose, Bld: 93 mg/dL (ref 70–99)
Potassium: 4.7 mmol/L (ref 3.5–5.1)
Sodium: 141 mmol/L (ref 135–145)

## 2018-04-25 LAB — VITAMIN B12: Vitamin B-12: 407 pg/mL (ref 180–914)

## 2018-04-25 MED ORDER — CARVEDILOL 12.5 MG PO TABS: 18.7500 mg | ORAL_TABLET | Freq: Two times a day (BID) | ORAL | 3 refills | Status: DC

## 2018-04-25 MED ORDER — FUROSEMIDE 40 MG PO TABS: 20.0000 mg | ORAL_TABLET | ORAL | 0 refills | Status: DC

## 2018-04-25 MED ORDER — SACUBITRIL-VALSARTAN 49-51 MG PO TABS: 1.0000 | ORAL_TABLET | Freq: Two times a day (BID) | ORAL | 3 refills | Status: DC

## 2018-04-25 NOTE — Progress Notes (Signed)
Patient ID: Brian Noble, male   DOB: [birth year], 58 y.o.   MRN: [id number]   PCP: Dr. Marina Goodell Cardiology: Dr Shirlee Latch EP Dr Graciela Husbands   65 y.o. with a prior history of cardiomyopathy who developed a cardiac arrest and was found to have low EF and normal coronaries.  Per patient, he was found to have low EF around 20% about 8 years ago.  He was seen by a cardiologist in [place] (he thinks), and EF went back to normal range.  He had no problems up until 8/16.  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  He was noted to pass out by family.  EMS was called and arrived quickly.  He was in either ventricular fibrillation or VT and was defibrillated.  He was cooled and sent for coronary angiography, which showed no significant coronary disease.  Echo showed EF 10% with severe RV dysfunction.  After recovery, he got a Medtronic ICD.  Repeat echo in 11/16 showed EF 20% with septal-lateral dyssynchrony, moderately decreased RV systolic function.  Unable to take Bidil due to severe headaches.    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 Appropriate shock due to VT. ECHO completed and showed EF 20-25% and normal RV. Carvedilol increased to 18.75 mg twice a day. He was discharged on 12/20/17. He had follow up with [name] EP NP and carvedilol was increased to 25 mg twice a day.   RHC/LHC was done in 11/19, showing no significant CAD and optimized filling pressures with preserved CI 2.31. CPX in 11/19 showed deconditioning but no significant cardiac limitation.    He returns today for followup of CHF.  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  Fatigued by the time he gets home from work.  No dyspnea walking on flat ground, but he does get short of breath with heavy work.  +Snoring.  No orthopnea/PND.  He says that fatigue and erectile dysfunction have worsened considerably since he increased Coreg to 25 mg bid.   ECG (personally reviewed): NSR, IVCD 140 msec  Labs (9/16): K 4.2,  creatinine 1.04 Labs (01/04/2015) : K 4.6 Creatinine 1.09, SPEP negative Labs (11/16): K 5.5, creatinine 1.29 Labs (02/17/2015): K 5.0 creatinine 1.2 Labs (06/02/2015): K 4.6 Creatinine 1.24, uric acid 9.9 Labs (4/17): TSH normal Labs (5/17): K 4, creatinine 1.13, BNP 187, HCT 45.1 Labs (10/17): K 4.3, creatinine 1.37 Labs (12/19/2017): K 4.7 Creatinine 1/29 Mag 2.  Labs (11/19): K 5.1 Creatinine 1.11 BNP 244 Hgb 14.6   PMH: 1. LBBB/IVCD 2. HTN: x years 3. Hyperlipidemia 4. Chronic systolic CHF: Patient was told around 2008 that his EF was about 20%.  He says that it recovered back to normal.  He thinks he may have seen a cardiologist in [place] at that time. On 11/10/14, he was admitted after having ventricular fibrillation versus VT arrest and being shocked by AED.   - LHC (8/16): Normal coronaries, EF < 25%. - Echo (8/16): EF 10%, diffuse hypokinesis, mild MR, severely decreased RV systolic function.  - Medtronic ICD (8/16).   - Echo (11/16): EF 20%, septal-lateral dyssynchrony, mildly dilated LV with mild LVH, moderately decreased RV systolic function.  - CPX (3/17): Peak VO2 23.1 (69% predicted peak VO2) VE/VCO2 slope: 32 OUES: 2.00 Peak RER: 1.11, Ventilatory Threshold: 17.7 (53% predicted or measured peak VO2) => Mild functional impairment. - Echo (12/17): EF 30%, mild LV dilation, septal-lateral dyssynchrony, mild to moderately decreased RV systolic function.  - ECHO (10/19): EF  20-25%.  - LHC/RHC (11/19): No significant coronary disease; RA mean 3, PA 24/10, PCWP mean 7, Cardiac Index (Fick) 2.31.  - CPX (11/19): Submaximal with RER 0.8, VE/VCO2 slope 29, peak VO2 15.1 => no significant cardiac limitation, appears deconditioned.  5. Event monitor (4/17): No atrial fibrillation.  Runs NSVT noted.  6. VT  FH: Sister with heart transplant, father with cardiac arrest when about 10, 2 other sisters with "heart problems."  He has 10 siblings in all.   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  ROS: All systems reviewed and negative except as per HPI.   Current Outpatient Medications  Medication Sig Dispense Refill  . aspirin EC 81 MG tablet Take 81 mg by mouth daily.    . carvedilol (COREG) 12.5 MG tablet Take 1.5 tablets (18.75 mg total) by mouth 2 (two) times daily. 90 tablet 3  . colchicine 0.6 MG tablet TAKE ONE TABLET BY MOUTH ONCE DAILY AS NEEDED FOR FLARE UPS (Patient taking differently: Take 0.6 mg by mouth as needed (for gout flare ups). ) 15 tablet 0  . eplerenone (INSPRA) 25 MG tablet TAKE ONE TABLET BY MOUTH ONCE DAILY (Patient taking differently: Take 25 mg by mouth daily. ) 30 tablet 5  . furosemide (LASIX) 40 MG tablet Take 0.5 tablets (20 mg total) by mouth every other day. 30 tablet 0  . lovastatin (MEVACOR) 20 MG tablet Take 20 mg by mouth daily.     . magnesium oxide (MAG-OX) 400 MG tablet Take 1 tablet (400 mg total) by mouth 2 (two) times daily. 60 tablet 0  . sildenafil (REVATIO) 20 MG tablet TAKE ONE-HALF (1/2) TABLET BY MOUTH ONCEDAILY AS NEEDED FOR ERECTILE DYSFUNCTION (Patient taking differently: Take 10 mg by mouth as needed (for ED). ) 10 tablet 1  . sacubitril-valsartan (ENTRESTO) 49-51 MG Take 1 tablet by mouth 2 (two) times daily. 60 tablet 3   No current facility-administered medications for this encounter.    BP (!) 148/82   Pulse 67   Wt 90.4 kg (199 lb 6.4 oz)   SpO2 96%   BMI 30.32 kg/m   Filed Weights   04/25/18 0937  Weight: 90.4 kg (199 lb 6.4 oz)   Wt Readings from Last 3 Encounters:  04/25/18 90.4 kg (199 lb 6.4 oz)  02/19/18 91.2 kg (201 lb)  01/30/18 88.9 kg (196 lb)   General: NAD Neck: No JVD, no thyromegaly or thyroid nodule.  Lungs: Clear to auscultation bilaterally with normal respiratory effort. CV: Nondisplaced PMI.  Heart regular S1/S2, no S3/S4, no murmur.  No peripheral edema.  No carotid bruit.  Normal pedal pulses.  Abdomen: Soft, nontender, no  hepatosplenomegaly, no distention.  Skin: Intact without lesions or rashes.  Neurologic: Alert and oriented x 3.  Psych: Normal affect. Extremities: No clubbing or cyanosis.  HEENT: Normal.   Assessment/Plan: 1. Chronic systolic CHF: Nonischemic cardiomyopathy.  Medtronic ICD.  Possibly familial CMP given father with SCD at 29, sister with heart transplant, and 2 other sisters with "heart problems."  Cannot rule out prior viral myocarditis or role for HTN. Echo 12/17 with EF 30%, septal-lateral dyssynchrony.  CPX in 3/17 with mild functional impairment. Echo in 10/19 with EF 20-25%.  CPX repeat 01/2018 with submaximal effort but minimal cardiac impairment, suggestive of deconditioning.  RHC in 11/19 showed optimized filling pressures with preserved cardiac index.  NYHA class II symptoms.  He is not volume overloaded on exam.  -  Fatigue much worse after increasing Coreg to 25 mg bid, also worsening erectile dysfunction.  I will let him decrease Coreg back to 18.75 mg bid.   - Increase Entresto to 49/51 bid and decrease Lasix to 20 mg every other day. BMET today and again in 10 days.  - Unable to tolerate Bidil due to headaches.  - Continue eplerenone 25 mg daily.  - Given possible familial CMP, will need to talk to Mr Pugmire at next appointment about [facility] genetic testing.  - Patient has IVCD, 140 msec on ECG today.  Given that this is not a true LBBB and QRS < 150, probably not good CRT candidate.  2. VT: Has Medtronic ICD.  VT with syncope in 10/19.   - He can drive again in 7/09 if no further events.    3 .Suspect sleep apnea: Daytime fatigue and sleepiness.  I will see if I can get him arranged to do a home sleep study. He does not want to do an in-hospital sleep study.     Followup in 3 months.   Marca Ancona 04/25/2018

## 2018-04-25 NOTE — Patient Instructions (Signed)
INCREASE Entresto to 49/51mg  twice daily.  DECREASE Carvedilol to 18.75mg  twice daily.  DECREASE Lasix to 20mg  every other day.  Lab work today. We will call you if results are abnormal.  Repeat labs in 10 days.  Follow up in 3 months

## 2018-04-26 ENCOUNTER — Encounter (HOSPITAL_COMMUNITY): Payer: BLUE CROSS/BLUE SHIELD | Admitting: Cardiology

## 2018-05-09 ENCOUNTER — Other Ambulatory Visit (HOSPITAL_COMMUNITY): Payer: Self-pay | Admitting: Adult Health

## 2018-05-10 ENCOUNTER — Other Ambulatory Visit (HOSPITAL_COMMUNITY): Payer: BLUE CROSS/BLUE SHIELD

## 2018-05-13 ENCOUNTER — Ambulatory Visit (HOSPITAL_COMMUNITY)
Admission: RE | Admit: 2018-05-13 | Discharge: 2018-05-13 | Disposition: A | Discharge status: Home / Self Care | Payer: BLUE CROSS/BLUE SHIELD | Source: Ambulatory Visit | Attending: Cardiology | Admitting: Cardiology

## 2018-05-13 DIAGNOSIS — I5022 Chronic systolic (congestive) heart failure: Secondary | ICD-10-CM | POA: Diagnosis not present

## 2018-05-13 LAB — BASIC METABOLIC PANEL
Anion gap: 6 (ref 5–15)
BUN: 9 mg/dL (ref 6–20)
CO2: 26 mmol/L (ref 22–32)
Calcium: 9.2 mg/dL (ref 8.9–10.3)
Chloride: 107 mmol/L (ref 98–111)
Creatinine, Ser: 1.03 mg/dL (ref 0.61–1.24)
GFR calc Af Amer: >60 mL/min (ref >60)
GFR calc non Af Amer: >60 mL/min (ref >60)
Glucose, Bld: 106 mg/dL — ABNORMAL HIGH (ref 70–99)
Potassium: 4.7 mmol/L (ref 3.5–5.1)
Sodium: 139 mmol/L (ref 135–145)

## 2018-05-14 ENCOUNTER — Ambulatory Visit (INDEPENDENT_AMBULATORY_CARE_PROVIDER_SITE_OTHER): Payer: BLUE CROSS/BLUE SHIELD | Admitting: *Deleted

## 2018-05-14 DIAGNOSIS — I469 Cardiac arrest, cause unspecified: Secondary | ICD-10-CM

## 2018-05-14 DIAGNOSIS — I428 Other cardiomyopathies: Secondary | ICD-10-CM

## 2018-05-15 LAB — CUP PACEART REMOTE DEVICE CHECK
Battery Remaining Longevity: 108 mo
Battery Voltage: 3 V
Brady Statistic RV Percent Paced: 0.02 %
Date Time Interrogation Session: 20200225223325
HighPow Impedance: 67 Ohm
Implantable Lead Implant Date: 20160829
Implantable Lead Location: 753860
Implantable Pulse Generator Implant Date: 20160829
Lead Channel Impedance Value: 380 Ohm
Lead Channel Impedance Value: 437 Ohm
Lead Channel Pacing Threshold Amplitude: 0.625 V
Lead Channel Pacing Threshold Pulse Width: 0.4 ms
Lead Channel Sensing Intrinsic Amplitude: 24.25 mV
Lead Channel Sensing Intrinsic Amplitude: 24.25 mV
Lead Channel Setting Pacing Amplitude: 2 V
Lead Channel Setting Pacing Pulse Width: 0.4 ms
Lead Channel Setting Sensing Sensitivity: 0.3 mV

## 2018-05-21 ENCOUNTER — Encounter: Payer: Self-pay | Admitting: Cardiology

## 2018-05-21 NOTE — Progress Notes (Signed)
Remote ICD transmission.   

## 2018-07-22 ENCOUNTER — Telehealth: Payer: Self-pay | Admitting: Internal Medicine

## 2018-07-22 NOTE — Telephone Encounter (Signed)
New message   Spoke w/pt scheduled an appt with Dr. Graciela Husbands on 05.07.20. Pt will do a phone call, preferred number is listed in the appt notes.      Virtual Visit Pre-Appointment Phone Call  "(Name), I am calling you today to discuss your upcoming appointment. We are currently trying to limit exposure to the virus that causes COVID-19 by seeing patients at home rather than in the office."  1. "What is the BEST phone number to call the day of the visit?" - include this in appointment notes  2. Do you have or have access to (through a family member/friend) a smartphone with video capability that we can use for your visit?" a. If yes - list this number in appt notes as cell (if different from BEST phone #) and list the appointment type as a VIDEO visit in appointment notes b. If no - list the appointment type as a PHONE visit in appointment notes  3. Confirm consent - "In the setting of the current Covid19 crisis, you are scheduled for a (phone or video) visit with your provider on (date) at (time).  Just as we do with many in-office visits, in order for you to participate in this visit, we must obtain consent.  If you'd like, I can send this to your mychart (if signed up) or email for you to review.  Otherwise, I can obtain your verbal consent now.  All virtual visits are billed to your insurance company just like a normal visit would be.  By agreeing to a virtual visit, we'd like you to understand that the technology does not allow for your provider to perform an examination, and thus may limit your provider's ability to fully assess your condition. If your provider identifies any concerns that need to be evaluated in person, we will make arrangements to do so.  Finally, though the technology is pretty good, we cannot assure that it will always work on either your or our end, and in the setting of a video visit, we may have to convert it to a phone-only visit.  In either situation, we cannot ensure  that we have a secure connection.  Are you willing to proceed?" STAFF: Did the patient verbally acknowledge consent to telehealth visit? Document YES/NO here: YES  4. Advise patient to be prepared - "Two hours prior to your appointment, go ahead and check your blood pressure, pulse, oxygen saturation, and your weight (if you have the equipment to check those) and write them all down. When your visit starts, your provider will ask you for this information. If you have an Apple Watch or Kardia device, please plan to have heart rate information ready on the day of your appointment. Please have a pen and paper handy nearby the day of the visit as well."  5. Give patient instructions for MyChart download to smartphone OR Doximity/Doxy.me as below if video visit (depending on what platform provider is using)  6. Inform patient they will receive a phone call 15 minutes prior to their appointment time (may be from unknown caller ID) so they should be prepared to answer    TELEPHONE CALL NOTE  Brian Noble has been deemed a candidate for a follow-up tele-health visit to limit community exposure during the Covid-19 pandemic. I spoke with the patient via phone to ensure availability of phone/video source, confirm preferred email & phone number, and discuss instructions and expectations.  I reminded Brian Noble to be prepared with any vital  sign and/or heart rhythm information that could potentially be obtained via home monitoring, at the time of his visit. I reminded Brian Noble to expect a phone call prior to his visit.  Brian Noble 07/22/2018 2:56 PM   INSTRUCTIONS FOR DOWNLOADING THE MYCHART APP TO SMARTPHONE  - The patient must first make sure to have activated MyChart and know their login information - If Apple, go to [employer] and type in MyChart in the search bar and download the app. If Android, ask patient to go to [employer] and type in [place] in the search bar and download  the app. The app is free but as with any other app downloads, their phone may require them to verify saved payment information or Apple/Android password.  - The patient will need to then log into the app with their MyChart username and password, and select [facility] as their healthcare provider to link the account. When it is time for your visit, go to the MyChart app, find appointments, and click Begin Video Visit. Be sure to Select Allow for your device to access the Microphone and Camera for your visit. You will then be connected, and your provider will be with you shortly.  **If they have any issues connecting, or need assistance please contact MyChart service desk [phone])**  **If using a computer, in order to ensure the best quality for their visit they will need to use either of the following Internet Browsers: [employer] or Google Chrome**  IF USING DOXIMITY or DOXY.ME - The patient will receive a link just prior to their visit by text.     FULL LENGTH CONSENT FOR TELE-HEALTH VISIT   I hereby voluntarily request, consent and authorize [facility] and its employed or contracted physicians, physician assistants, nurse practitioners or other licensed health care professionals (the Practitioner), to provide me with telemedicine health care services (the Services") as deemed necessary by the treating Practitioner. I acknowledge and consent to receive the Services by the Practitioner via telemedicine. I understand that the telemedicine visit will involve communicating with the Practitioner through live audiovisual communication technology and the disclosure of certain medical information by electronic transmission. I acknowledge that I have been given the opportunity to request an in-person assessment or other available alternative prior to the telemedicine visit and am voluntarily participating in the telemedicine visit.  I understand that I have the right to withhold  or withdraw my consent to the use of telemedicine in the course of my care at any time, without affecting my right to future care or treatment, and that the Practitioner or I may terminate the telemedicine visit at any time. I understand that I have the right to inspect all information obtained and/or recorded in the course of the telemedicine visit and may receive copies of available information for a reasonable fee.  I understand that some of the potential risks of receiving the Services via telemedicine include:   Delay or interruption in medical evaluation due to technological equipment failure or disruption;  Information transmitted may not be sufficient (e.g. poor resolution of images) to allow for appropriate medical decision making by the Practitioner; and/or   In rare instances, security protocols could Noble, causing a breach of personal health information.  Furthermore, I acknowledge that it is my responsibility to provide information about my medical history, conditions and care that is complete and accurate to the best of my ability. I acknowledge that Practitioner's advice, recommendations, and/or decision may  be based on factors not within their control, such as incomplete or inaccurate data provided by me or distortions of diagnostic images or specimens that may result from electronic transmissions. I understand that the practice of medicine is not an exact science and that Practitioner makes no warranties or guarantees regarding treatment outcomes. I acknowledge that I will receive a copy of this consent concurrently upon execution via email to the email address I last provided but may also request a printed copy by calling the office of [facility].    I understand that my insurance will be billed for this visit.   I have read or had this consent read to me.  I understand the contents of this consent, which adequately explains the benefits and risks of the Services being provided  via telemedicine.   I have been provided ample opportunity to ask questions regarding this consent and the Services and have had my questions answered to my satisfaction.  I give my informed consent for the services to be provided through the use of telemedicine in my medical care  By participating in this telemedicine visit I agree to the above.

## 2018-07-25 ENCOUNTER — Encounter: Payer: Self-pay | Admitting: Internal Medicine

## 2018-07-25 ENCOUNTER — Other Ambulatory Visit: Payer: Self-pay

## 2018-07-25 ENCOUNTER — Telehealth (INDEPENDENT_AMBULATORY_CARE_PROVIDER_SITE_OTHER): Payer: BLUE CROSS/BLUE SHIELD | Admitting: Internal Medicine

## 2018-07-25 VITALS — Ht 68.0 in | Wt 190.0 lb

## 2018-07-25 DIAGNOSIS — I428 Other cardiomyopathies: Secondary | ICD-10-CM

## 2018-07-25 DIAGNOSIS — I472 Ventricular tachycardia, unspecified: Secondary | ICD-10-CM

## 2018-07-25 DIAGNOSIS — I4729 Other ventricular tachycardia: Secondary | ICD-10-CM

## 2018-07-25 DIAGNOSIS — Z95810 Presence of automatic (implantable) cardiac defibrillator: Secondary | ICD-10-CM

## 2018-07-25 DIAGNOSIS — I5022 Chronic systolic (congestive) heart failure: Secondary | ICD-10-CM

## 2018-07-25 NOTE — Progress Notes (Signed)
Electrophysiology TeleHealth Note   Due to national recommendations of social distancing due to COVID 19, an audio/video telehealth visit is felt to be most appropriate for this patient at this time. The patient did not have access to video technology/had technical difficulties with video requiring transitioning to audio format only (telephone).  All issues noted in this document were discussed and addressed.  No physical exam could be performed with this format.      See MyChart message from today for the patient's consent to telehealth [facility].   Date:  07/25/2018   ID:  Brian OatsJohnny L Noble, DOB 08-12-1960, MRN [id number]  Location: patient's home  Provider location: [address], [place]  Evaluation Performed: Follow-up visit  PCP:  Abigail MiyamotoPerry, Lawrence Edward, MD  [facility]   [facility] Electrophysiologist:  SK   Chief Complaint:  Ventricular tachycardia *  History of Present Illness:    Brian Noble is a 58 y.o. male who presents via audio/video conferencing for a telehealth visit today.  Since last being seen in our clinic for aborted cardiac arrest with Medtronic ICD in place, the patient reports intercurrent ICD shock appropriate for VT   Increased BB  When seen as an outpatient, Sherryll Burgerntresto was also uptitrated.  Denies chest pain shortness of breath or palpitations.  No edema.  DATE TEST EF   11/16 Echo   EF 20 %   12/17 Echo   EF 30 %   10/19 Echo  20-25%   11/19 LHC  CA without obstruction    Date Cr K Mg  12/17  1.17 5.0 2.1  2/20 1.03 4.7 2.3    The patient denies symptoms of fevers, chills, cough, or new SOB worrisome for COVID 19. *   Past Medical History:  Diagnosis Date  . Arthritis   . Chronic kidney disease   . Chronic systolic CHF (congestive heart failure) (HCC)    a. Echo 8/16:  EF 10%, diff HK, Gr 1 DD, mild dilated aortic root, mild MR, mild LAE, severely reduced RVSF  . Hypertension   . ICD (implantable  cardioverter-defibrillator) in place   . NICM (nonischemic cardiomyopathy) (HCC)    a. LHC 8/16:  normal coronary arteries.   . VF (ventricular fibrillation) (HCC)    s/p OOH cardiac arrest 8/16 >> s/p ICD    Past Surgical History:  Procedure Laterality Date  . APPENDECTOMY    . CARDIAC CATHETERIZATION N/A 11/10/2014   Procedure: Left Heart Cath and Coronary Angiography;  Surgeon: Runell GessJonathan J Berry, MD;  [facility];  Service: Cardiovascular;  Laterality: N/A;  . EP IMPLANTABLE DEVICE N/A 11/16/2014   Procedure: ICD Implant;  Surgeon: Duke SalviaSteven C Klein, MD;  [facility];  Service: Cardiovascular;  Laterality: N/A;  . RIGHT HEART CATH AND CORONARY ANGIOGRAPHY N/A 01/30/2018   Procedure: RIGHT HEART CATH AND CORONARY ANGIOGRAPHY;  Surgeon: Laurey MoraleMcLean, Dalton S, MD;  [facility];  Service: Cardiovascular;  Laterality: N/A;  . ULTRASOUND GUIDANCE FOR VASCULAR ACCESS  01/30/2018   Procedure: Ultrasound Guidance For Vascular Access;  Surgeon: Laurey MoraleMcLean, Dalton S, MD;  [facility];  Service: Cardiovascular;;    Current Outpatient Medications  Medication Sig Dispense Refill  . aspirin EC 81 MG tablet Take 81 mg by mouth daily.    . carvedilol (COREG) 12.5 MG tablet Take 1.5 tablets (18.75 mg total) by mouth 2 (two) times daily. 90 tablet 3  . colchicine  0.6 MG tablet TAKE ONE TABLET BY MOUTH ONCE DAILY AS NEEDED FOR FLARE UPS 15 tablet 0  . eplerenone (INSPRA) 25 MG tablet TAKE 1 TABLET BY MOUTH DAILY 30 tablet 11  . furosemide (LASIX) 40 MG tablet Take 0.5 tablets (20 mg total) by mouth every other day. 30 tablet 0  . lovastatin (MEVACOR) 20 MG tablet Take 20 mg by mouth daily.     . magnesium oxide (MAG-OX) 400 MG tablet Take 400 mg by mouth daily.    . sacubitril-valsartan (ENTRESTO) 49-51 MG Take 1 tablet by mouth 2 (two) times daily. 60 tablet 3  . sildenafil (REVATIO) 20 MG tablet TAKE ONE-HALF (1/2) TABLET BY MOUTH ONCEDAILY AS  NEEDED FOR ERECTILE DYSFUNCTION 10 tablet 1   No current facility-administered medications for this visit.     Allergies:   Patient has no known allergies.   Social History:  The patient  reports that he has never smoked. He has never used smokeless tobacco. He reports that he does not drink alcohol or use drugs.   Family History:  The patient's   family history includes Heart disease in his sister and sister; Hypertension in his brother, brother, brother, brother, father, mother, sister, sister, and sister; Sudden death in his father.   ROS:  Please see the history of present illness.   All other systems are personally reviewed and negative.    Exam:    Vital Signs:  Ht 5\' 8"  (1.727 m)   Wt 190 lb (86.2 kg)   BMI 28.89 kg/m        Labs/Other Tests and Data Reviewed:    Recent Labs: 12/19/2017: ALT 39 12/20/2017: TSH 2.306 01/29/2018: B Natriuretic Peptide 244.1; Hemoglobin 14.6; Magnesium 2.3; Platelets 229 05/13/2018: BUN 9; Creatinine, Ser 1.03; Potassium 4.7; Sodium 139   Wt Readings from Last 3 Encounters:  07/25/18 190 lb (86.2 kg)  04/25/18 199 lb 6.4 oz (90.4 kg)  02/19/18 201 lb (91.2 kg)     Other studies personally reviewed: Additional studies/ records that were reviewed today include As above Review of the above records today demonstrates: As above  Prior radiographs:    T   Last device remote is reviewed from PaceART PDF dated2/20 which reveals normal device function,   arrhythmias - recurrent VT NS and AFib   ASSESSMENT & PLAN:    Aborted cardiac arrest  Ventricular tachycardia-nonsustained with sequential runs  Ventricular tachycardia-ICD ATP/shocks 10/19  Nonischemic cardiomyopathy  CHF chronic systolic  IVCD   Implantable defibrillator-Medtronic \ Family history of cardiac arrest   The patient had treated ventricular tachycardia.  Review of his ICD demonstrates recurrent nonsustained runs that often come in sequence.  We will have to  see whether the Entresto up titration 2/20 has had any impact.  If not, would discuss with Dr. DM regarding antiarrhythmic suppression.  Agree with Dr. DM for genetic counseling and testing is in order. I am not as big a fan of [employer]  IVCD does not invite CRT  By history seems euvolemic  Continue current meds   COVID 19 screen The patient denies symptoms of COVID 19 at this time.  The importance of social distancing was discussed today.  Follow-up: 4 weeks  Next remote: As Scheduled   Current medicines are reviewed at length with the patient today.   The patient does not have concerns regarding his medicines.  The following changes were made today:  none  Labs/ tests ordered today include:   No orders of  the defined types were placed in this encounter.   Future tests ( post COVID )    Patient Risk:  after full review of this patients clinical status, I feel that they are at moderate risk at this time.  Today, I have spent 9 minutes with the patient with telehealth technology discussing the above.  Signed, Sherryl Manges, MD  07/25/2018 4:21 PM     [facility] [address] [place] [address] [phone] (office) [phone] (fax)

## 2018-07-26 ENCOUNTER — Encounter (HOSPITAL_COMMUNITY): Payer: BLUE CROSS/BLUE SHIELD | Admitting: Cardiology

## 2018-07-29 ENCOUNTER — Encounter (HOSPITAL_COMMUNITY): Payer: Self-pay

## 2018-07-29 ENCOUNTER — Ambulatory Visit (HOSPITAL_COMMUNITY)
Admission: RE | Admit: 2018-07-29 | Discharge: 2018-07-29 | Disposition: A | Discharge status: Home / Self Care | Payer: BLUE CROSS/BLUE SHIELD | Source: Ambulatory Visit | Attending: Cardiology | Admitting: Cardiology

## 2018-07-29 ENCOUNTER — Other Ambulatory Visit: Payer: Self-pay

## 2018-07-29 VITALS — Wt 190.0 lb

## 2018-07-29 DIAGNOSIS — I5022 Chronic systolic (congestive) heart failure: Secondary | ICD-10-CM

## 2018-07-29 DIAGNOSIS — I4729 Other ventricular tachycardia: Secondary | ICD-10-CM

## 2018-07-29 DIAGNOSIS — I428 Other cardiomyopathies: Secondary | ICD-10-CM | POA: Diagnosis not present

## 2018-07-29 DIAGNOSIS — I472 Ventricular tachycardia: Secondary | ICD-10-CM | POA: Diagnosis not present

## 2018-07-29 NOTE — Progress Notes (Signed)
Heart Failure TeleHealth Note  Due to national recommendations of social distancing due to COVID 19, Audio/video telehealth visit is felt to be most appropriate for this patient at this time.  See MyChart message from today for patient consent regarding telehealth for [facility]  Date:  07/29/2018   ID:  Brian Noble, DOB [birth year], MRN [id number]  Location: Home  Provider location: [place] [facility] Type of Visit: Established patient   PCP:  [name], MD  Cardiologist:  No primary care provider on file. Primary HF: Dr Shirlee Latch EP: Dr Graciela Husbands   Chief Complaint: Heart Failure    History of Present Illness: Brian Noble is a 58 y.o. male with a history of cardiomyopathy who developed a cardiac arrest and was found to have low EF and normal coronaries.  Per patient, he was found to have low EF around 20% about 8 years ago.  He was seen by a cardiologist in [place] (he thinks), and EF went back to normal range.  He had no problems up until 8/16.  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  He was noted to pass out by family.  EMS was called and arrived quickly.  He was in either ventricular fibrillation or VT and was defibrillated.  He was cooled and sent for coronary angiography, which showed no significant coronary disease.  Echo showed EF 10% with severe RV dysfunction.  After recovery, he got a Medtronic ICD.  Repeat echo in 11/16 showed EF 20% with septal-lateral dyssynchrony, moderately decreased RV systolic function.  Unable to take Bidil due to severe headaches.    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 Appropriate shock due to VT. ECHO completed and showed EF 20-25% and normal RV. Carvedilol increased to 18.75 mg twice a day. He was discharged on 12/20/17. He had follow up with [name] EP NP and carvedilol was increased to 25 mg twice a day.   RHC/LHC was done in 11/19, showing no significant CAD and optimized  filling pressures with preserved CI 2.31. CPX in 11/19 showed deconditioning but no significant cardiac limitation.    He presents via Special educational needs teacher for a telehealth visit today.   Overall feeling fine. Denies SOB/PND/Orthopnea. Trying to walk at home.  Appetite ok. No fever or chills. Weight at home 190 pounds.  Taking all medications. Currently not working.   he denies symptoms worrisome for COVID 19.   Past Medical History:  Diagnosis Date  . Arthritis   . Chronic kidney disease   . Chronic systolic CHF (congestive heart failure) (HCC)    a. Echo 8/16:  EF 10%, diff HK, Gr 1 DD, mild dilated aortic root, mild MR, mild LAE, severely reduced RVSF  . Hypertension   . ICD (implantable cardioverter-defibrillator) in place   . NICM (nonischemic cardiomyopathy) (HCC)    a. LHC 8/16:  normal coronary arteries.   . VF (ventricular fibrillation) (HCC)    s/p OOH cardiac arrest 8/16 >> s/p ICD   Past Surgical History:  Procedure Laterality Date  . APPENDECTOMY    . CARDIAC CATHETERIZATION N/A 11/10/2014   Procedure: Left Heart Cath and Coronary Angiography;  Surgeon: [name], MD;  [facility] LAB;  Service: Cardiovascular;  Laterality: N/A;  . EP IMPLANTABLE DEVICE N/A 11/16/2014   Procedure: ICD Implant;  Surgeon: [name], MD;  [facility];  Service: Cardiovascular;  Laterality: N/A;  . RIGHT HEART  CATH AND CORONARY ANGIOGRAPHY N/A 01/30/2018   Procedure: RIGHT HEART CATH AND CORONARY ANGIOGRAPHY;  Surgeon: Laurey MoraleMcLean, Dalton S, MD;  [facility];  Service: Cardiovascular;  Laterality: N/A;  . ULTRASOUND GUIDANCE FOR VASCULAR ACCESS  01/30/2018   Procedure: Ultrasound Guidance For Vascular Access;  Surgeon: Laurey MoraleMcLean, Dalton S, MD;  [facility];  Service: Cardiovascular;;     Current Outpatient Medications  Medication Sig Dispense Refill  . aspirin EC 81 MG tablet Take 81 mg by mouth daily.    . carvedilol  (COREG) 12.5 MG tablet Take 1.5 tablets (18.75 mg total) by mouth 2 (two) times daily. 90 tablet 3  . colchicine 0.6 MG tablet TAKE ONE TABLET BY MOUTH ONCE DAILY AS NEEDED FOR FLARE UPS 15 tablet 0  . eplerenone (INSPRA) 25 MG tablet TAKE 1 TABLET BY MOUTH DAILY 30 tablet 11  . furosemide (LASIX) 40 MG tablet Take 0.5 tablets (20 mg total) by mouth every other day. 30 tablet 0  . lovastatin (MEVACOR) 20 MG tablet Take 20 mg by mouth daily.     . magnesium oxide (MAG-OX) 400 MG tablet Take 400 mg by mouth daily.    . sacubitril-valsartan (ENTRESTO) 49-51 MG Take 1 tablet by mouth 2 (two) times daily. 60 tablet 3  . sildenafil (REVATIO) 20 MG tablet TAKE ONE-HALF (1/2) TABLET BY MOUTH ONCEDAILY AS NEEDED FOR ERECTILE DYSFUNCTION 10 tablet 1   No current facility-administered medications for this encounter.     Allergies:   Patient has no known allergies.   Social History:  The patient  reports that he has never smoked. He has never used smokeless tobacco. He reports that he does not drink alcohol or use drugs.   Family History:  The patient's family history includes Heart disease in his sister and sister; Hypertension in his brother, brother, brother, brother, father, mother, sister, sister, and sister; Sudden death in his father.   ROS:  Please see the history of present illness.   All other systems are personally reviewed and negative.   Exam:  Tele Health Call; Exam is subjective General:  Speaks in full sentences. No resp difficulty. Lungs: Normal respiratory effort with conversation.  Abdomen: Non-distended per patient report Extremities: Pt denies edema. Neuro: Alert & oriented x 3.   Recent Labs: 12/19/2017: ALT 39 12/20/2017: TSH 2.306 01/29/2018: B Natriuretic Peptide 244.1; Hemoglobin 14.6; Magnesium 2.3; Platelets 229 05/13/2018: BUN 9; Creatinine, Ser 1.03; Potassium 4.7; Sodium 139  Personally reviewed   Wt Readings from Last 3 Encounters:  07/25/18 86.2 kg (190 lb)   04/25/18 90.4 kg (199 lb 6.4 oz)  02/19/18 91.2 kg (201 lb)     ASSESSMENT AND PLAN:  1. Chronic systolic CHF: Nonischemic cardiomyopathy.  Medtronic ICD.  Possibly familial CMP given father with SCD at 4140, sister with heart transplant, and 2 other sisters with "heart problems."  Cannot rule out prior viral myocarditis or role for HTN. Echo 12/17 with EF 30%, septal-lateral dyssynchrony.  CPX in 3/17 with mild functional impairment. Echo in 10/19 with EF 20-25%.  CPX repeat 01/2018 with submaximal effort but minimal cardiac impairment, suggestive of deconditioning.  RHC in 11/19 showed optimized filling pressures with preserved cardiac index.   NYHA II. Volume status stable.   - Continue Coreg back to 18.75 mg bid.  Intolerant 25 mg twice a day due to profound fatigue.  - Continue Entresto to 49/51 bid  - Continue lasix 20 mg every other day.  - Unable  to tolerate Bidil due to headaches.  - Continue eplerenone 25 mg daily.  - Given possible familial CMP, will need to talk to Mr Champigny at next appointment about [employer] genetic testing.  - Patient has IVCD, 140 msec on ECG today.  Given that this is not a true LBBB and QRS < 150, probably not good CRT candidate.  2. VT: Has Medtronic ICD.  VT with syncope in 10/19.  No recent shocks.  Followed by EP.   3 .Suspect sleep apnea: Daytime fatigue and sleepiness He is interested in a home sleep study. We will set up home sleep study with Dr Mayford Knife to read.   COVID screen The patient does not have any symptoms that suggest any further testing/ screening at this time.  Social distancing reinforced today.  Patient Risk: After full review of this patients clinical status, I feel that they are at moderate risk for cardiac decompensation at this time.  Relevant cardiac medications were reviewed at length with the patient today. The patient does not have concerns regarding their medications at this time.   The following changes were made today: Home  sleep study. BMET and Mag-->. Set up for next week.   Recommended follow-up:  Follow up 3-4 months with Dr Shirlee Latch.    Today, I have spent  12 minutes with the patient with telehealth technology discussing the above issues .    Waneta Martins, NP  07/29/2018 12:48 PM  [facility] [address] Heart and Vascular [place] [address] [phone] (office) [phone] (fax)

## 2018-07-29 NOTE — Progress Notes (Signed)
Patient Name: Brian Noble         DOB: [birth year]      Height: 5'8    Weight: 190lb  Office Name: [facility]         Referring Provider: Tonye Becket  Today's Date: 07/29/18  Date:  07/29/18 STOP BANG RISK ASSESSMENT S (snore) Have you been told that you snore?     YES   T (tired) Are you often tired, fatigued, or sleepy during the day?   NO  O (obstruction) Do you stop breathing, choke, or gasp during sleep? YES   P (pressure) Do you have or are you being treated for high blood pressure? YES   B (BMI) Is your body index greater than 35 kg/m? NO   A (age) Are you 27 years old or older? YES   N (neck) Do you have a neck circumference greater than 16 inches?   YES/NO   G (gender) Are you a male? YES   TOTAL STOP/BANG "YES" ANSWERS 5                                                                       For Office Use Only              Procedure Order Form    YES to 3+ Stop Bang questions OR two clinical symptoms - patient qualifies for WatchPAT (CPT 95800)     Submit: This Form + Patient Face Sheet + Clinical Note via CloudPAT or Fax: [phone]         Clinical Notes: Will consult Sleep Specialist and refer for management of therapy due to patient increased risk of Sleep Apnea. Ordering a sleep study due to the following two clinical symptoms: Excessive daytime sleepiness G47.10 / Gastroesophageal reflux K21.9 / Nocturia R35.1 / Morning Headaches G44.221 / Difficulty concentrating R41.840 / Memory problems or poor judgment G31.84 / Personality changes or irritability R45.4 / Loud snoring R06.83 / Depression F32.9 / Unrefreshed by sleep G47.8 / Impotence N52.9 / History of high blood pressure R03.0 / Insomnia G47.00

## 2018-07-29 NOTE — Progress Notes (Signed)
Spoke with patient, AVS discussed. Pt verbalized understanding.  Lab appt made for next week.  All documents pertaining to sleep study faxed via Epic to [facility] (OV, Demographics and Order).  AVS mailed to patient.

## 2018-07-29 NOTE — Patient Instructions (Addendum)
Home sleep study:   Once test is complete, information is automatically sent to company and they send report to Dr. Mayford Knife at [facility] for interpretation.  If your results are positive, they will contact you with more information and plan.  Throw out equipment at the completion of test.   Labs on May 21st, 2020 at [facility] We will only contact you if something comes back abnormal or we need to make some changes. Otherwise no news is good news!  Recommended follow-up: Follow up 3-4 months with Dr Shirlee Latch.

## 2018-07-29 NOTE — Addendum Note (Signed)
Encounter addended by: Marisa Hua, RN on: 07/29/2018 2:26 PM  Actions taken: Order list changed, Diagnosis association updated, Clinical Note Signed

## 2018-08-07 ENCOUNTER — Telehealth (HOSPITAL_COMMUNITY): Payer: Self-pay

## 2018-08-07 NOTE — Telephone Encounter (Signed)

## 2018-08-08 ENCOUNTER — Other Ambulatory Visit: Payer: Self-pay

## 2018-08-08 ENCOUNTER — Ambulatory Visit (HOSPITAL_COMMUNITY)
Admission: RE | Admit: 2018-08-08 | Discharge: 2018-08-08 | Disposition: A | Discharge status: Home / Self Care | Payer: BLUE CROSS/BLUE SHIELD | Source: Ambulatory Visit | Attending: Internal Medicine | Admitting: Internal Medicine

## 2018-08-08 DIAGNOSIS — L299 Pruritus, unspecified: Secondary | ICD-10-CM | POA: Diagnosis not present

## 2018-08-08 DIAGNOSIS — B0229 Other postherpetic nervous system involvement: Secondary | ICD-10-CM | POA: Diagnosis not present

## 2018-08-08 DIAGNOSIS — I5022 Chronic systolic (congestive) heart failure: Secondary | ICD-10-CM | POA: Diagnosis not present

## 2018-08-08 DIAGNOSIS — B029 Zoster without complications: Secondary | ICD-10-CM | POA: Diagnosis not present

## 2018-08-08 LAB — BASIC METABOLIC PANEL
Anion gap: 10 (ref 5–15)
BUN: 12 mg/dL (ref 6–20)
CO2: 24 mmol/L (ref 22–32)
Calcium: 8.7 mg/dL — ABNORMAL LOW (ref 8.9–10.3)
Chloride: 104 mmol/L (ref 98–111)
Creatinine, Ser: 1.16 mg/dL (ref 0.61–1.24)
GFR calc Af Amer: >60 mL/min (ref >60)
GFR calc non Af Amer: >60 mL/min (ref >60)
Glucose, Bld: 151 mg/dL — ABNORMAL HIGH (ref 70–99)
Potassium: 4.3 mmol/L (ref 3.5–5.1)
Sodium: 138 mmol/L (ref 135–145)

## 2018-08-08 LAB — MAGNESIUM: Magnesium: 2 mg/dL (ref 1.7–2.4)

## 2018-08-13 ENCOUNTER — Ambulatory Visit (INDEPENDENT_AMBULATORY_CARE_PROVIDER_SITE_OTHER): Payer: BLUE CROSS/BLUE SHIELD | Admitting: *Deleted

## 2018-08-13 DIAGNOSIS — I5022 Chronic systolic (congestive) heart failure: Secondary | ICD-10-CM | POA: Diagnosis not present

## 2018-08-13 DIAGNOSIS — I469 Cardiac arrest, cause unspecified: Secondary | ICD-10-CM

## 2018-08-13 DIAGNOSIS — I428 Other cardiomyopathies: Secondary | ICD-10-CM

## 2018-08-14 LAB — CUP PACEART REMOTE DEVICE CHECK
Battery Remaining Longevity: 105 mo
Battery Voltage: 3.01 V
Brady Statistic RV Percent Paced: 0.01 %
Date Time Interrogation Session: 20200526041704
HighPow Impedance: 73 Ohm
Implantable Lead Implant Date: 20160829
Implantable Lead Location: 753860
Implantable Pulse Generator Implant Date: 20160829
Lead Channel Impedance Value: 399 Ohm
Lead Channel Impedance Value: 494 Ohm
Lead Channel Pacing Threshold Amplitude: 0.75 V
Lead Channel Pacing Threshold Pulse Width: 0.4 ms
Lead Channel Sensing Intrinsic Amplitude: 24.875 mV
Lead Channel Sensing Intrinsic Amplitude: 24.875 mV
Lead Channel Setting Pacing Amplitude: 2 V
Lead Channel Setting Pacing Pulse Width: 0.4 ms
Lead Channel Setting Sensing Sensitivity: 0.3 mV

## 2018-08-20 ENCOUNTER — Other Ambulatory Visit (HOSPITAL_COMMUNITY): Payer: Self-pay | Admitting: Cardiology

## 2018-08-20 DIAGNOSIS — I472 Ventricular tachycardia, unspecified: Secondary | ICD-10-CM

## 2018-08-22 NOTE — Progress Notes (Signed)
Remote ICD transmission.   

## 2018-10-20 ENCOUNTER — Emergency Department (HOSPITAL_COMMUNITY)
Admission: EM | Admit: 2018-10-20 | Discharge: 2018-10-21 | Disposition: A | Discharge status: Home / Self Care | Payer: BLUE CROSS/BLUE SHIELD | Attending: Emergency Medicine | Admitting: Emergency Medicine

## 2018-10-20 ENCOUNTER — Other Ambulatory Visit: Payer: Self-pay

## 2018-10-20 ENCOUNTER — Emergency Department (HOSPITAL_COMMUNITY): Payer: BLUE CROSS/BLUE SHIELD

## 2018-10-20 ENCOUNTER — Encounter (HOSPITAL_COMMUNITY): Payer: Self-pay | Admitting: *Deleted

## 2018-10-20 DIAGNOSIS — I472 Ventricular tachycardia: Secondary | ICD-10-CM | POA: Diagnosis not present

## 2018-10-20 DIAGNOSIS — I5022 Chronic systolic (congestive) heart failure: Secondary | ICD-10-CM | POA: Diagnosis not present

## 2018-10-20 DIAGNOSIS — R079 Chest pain, unspecified: Secondary | ICD-10-CM | POA: Diagnosis not present

## 2018-10-20 DIAGNOSIS — T82198A Other mechanical complication of other cardiac electronic device, initial encounter: Secondary | ICD-10-CM | POA: Diagnosis not present

## 2018-10-20 DIAGNOSIS — I130 Hypertensive heart and chronic kidney disease with heart failure and stage 1 through stage 4 chronic kidney disease, or unspecified chronic kidney disease: Secondary | ICD-10-CM | POA: Insufficient documentation

## 2018-10-20 DIAGNOSIS — N189 Chronic kidney disease, unspecified: Secondary | ICD-10-CM | POA: Insufficient documentation

## 2018-10-20 DIAGNOSIS — Z7982 Long term (current) use of aspirin: Secondary | ICD-10-CM | POA: Diagnosis not present

## 2018-10-20 DIAGNOSIS — Z79899 Other long term (current) drug therapy: Secondary | ICD-10-CM | POA: Diagnosis not present

## 2018-10-20 DIAGNOSIS — Y69 Unspecified misadventure during surgical and medical care: Secondary | ICD-10-CM | POA: Diagnosis not present

## 2018-10-20 DIAGNOSIS — Z4502 Encounter for adjustment and management of automatic implantable cardiac defibrillator: Secondary | ICD-10-CM

## 2018-10-20 DIAGNOSIS — R9431 Abnormal electrocardiogram [ECG] [EKG]: Secondary | ICD-10-CM | POA: Diagnosis not present

## 2018-10-20 DIAGNOSIS — R0602 Shortness of breath: Secondary | ICD-10-CM | POA: Diagnosis not present

## 2018-10-20 DIAGNOSIS — I4729 Other ventricular tachycardia: Secondary | ICD-10-CM

## 2018-10-20 LAB — BASIC METABOLIC PANEL
Anion gap: 8 (ref 5–15)
BUN: 17 mg/dL (ref 6–20)
CO2: 22 mmol/L (ref 22–32)
Calcium: 9.3 mg/dL (ref 8.9–10.3)
Chloride: 108 mmol/L (ref 98–111)
Creatinine, Ser: 1.27 mg/dL — ABNORMAL HIGH (ref 0.61–1.24)
GFR calc Af Amer: >60 mL/min (ref >60)
GFR calc non Af Amer: >60 mL/min (ref >60)
Glucose, Bld: 122 mg/dL — ABNORMAL HIGH (ref 70–99)
Potassium: 4 mmol/L (ref 3.5–5.1)
Sodium: 138 mmol/L (ref 135–145)

## 2018-10-20 LAB — CBC
HCT: 47.5 % (ref 39.0–52.0)
Hemoglobin: 15.2 g/dL (ref 13.0–17.0)
MCH: 29.9 pg (ref 26.0–34.0)
MCHC: 32 g/dL (ref 30.0–36.0)
MCV: 93.3 fL (ref 80.0–100.0)
Platelets: 212 10*3/uL (ref 150–400)
RBC: 5.09 MIL/uL (ref 4.22–5.81)
RDW: 12.4 % (ref 11.5–15.5)
WBC: 7.4 10*3/uL (ref 4.0–10.5)
nRBC: 0 % (ref 0.0–0.2)

## 2018-10-20 LAB — TROPONIN I (HIGH SENSITIVITY)
Troponin I (High Sensitivity): 5 ng/L (ref <18)
Troponin I (High Sensitivity): 7 ng/L (ref <18)

## 2018-10-20 MED ORDER — SODIUM CHLORIDE 0.9% FLUSH: 3.0000 mL | Freq: Once | INTRAVENOUS | Status: DC

## 2018-10-20 NOTE — ED Notes (Addendum)
Genella Mech Wife. Please call her with updates [phone].Marland Kitchen

## 2018-10-20 NOTE — ED Triage Notes (Signed)
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 Pt denies chest pain, sob.

## 2018-10-20 NOTE — ED Notes (Signed)
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 Pt states he has had it on both times that his defibrillator has fired. 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 Pt is wondering if the microphone is making it fire

## 2018-10-21 DIAGNOSIS — I472 Ventricular tachycardia: Secondary | ICD-10-CM | POA: Diagnosis not present

## 2018-10-21 NOTE — Telephone Encounter (Signed)
Spoke with patient and wife. 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 Reviewed via [facility]--EGM suggests at least two different VT morphologies, appears initial event changes with ATP burst during charging, then runs of VT continue even after 35J shock is delivered. 9 NSVT episodes prior to treated episodes.   Discharged from ED and told to f/u this week with EP (Dr. [name]). Feels fine today, denies cardiac symptoms. Confirmed taking all cardiac medications as prescribed, no missed doses. Previously unable to tolerate carvedilol 25mg  BID due to extreme weakness per wife, but seems to be doing well with carvedilol 18.75mg  BID and Entresto 49-51mg  BID. Pt was written out of work until 8/5 by ED MD, but pt's wife reports he wants to go back ASAP. 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, low stress, works with a Advertising account planner.  Advised of [place] DMV driving restrictions x6 months. Pt (in background of call) and wife verbalize understanding. Pt asked if his microphone could've caused the shock. Reassured that no evidence of "noise" is noted, advised to wear microphone >6" from ICD in the future. Advised I will call back with MD recommendations. Pt's wife in agreement, requests call at [phone].

## 2018-10-21 NOTE — ED Notes (Signed)
Dannial Monarch with [employer] confirmed with RN.  Pt in HR 286 BPM average Nonsustained VT, device attempted to terminate rhythm, unsuccessful with 35 J shock, but self terminated.   Per representative, Device functioning appropriately.   MD can reach representative at [phone]

## 2018-10-21 NOTE — ED Notes (Signed)
Discharge instructions and follow up care with cardiologist discussed with pt. Pt verbalized understanding. Wife at bedside. No questions at this time   Work excuse given to pt.

## 2018-10-21 NOTE — Discharge Instructions (Addendum)
Follow-up with your cardiologist in the next 1 to 2 days.  Continue medications as previously prescribed.

## 2018-10-21 NOTE — ED Provider Notes (Signed)
Care assumed from Dr. Rogene Houston at shift change.  Patient presented here after receiving a shock from his defibrillator.  The device was interrogated and shows an episode of V. fib for which a shock was delivered, but V. fib terminated spontaneously.  Patient has been observed for many hours her laboratory studies are unremarkable and has had no further ectopy.  This was discussed with Dr. Charissa Bash from cardiology.  He is evaluated the patient and the [employer] report.  He feels that the patient is appropriate for discharge.   Veryl Speak, MD 10/21/18 0111

## 2018-10-21 NOTE — ED Provider Notes (Signed)
[facility] EMERGENCY DEPARTMENT Provider Note   CSN: [id number] Arrival date & time: 10/20/18  1936     History   Chief Complaint Chief Complaint  Patient presents with  . Chest Pain    HPI Brian Noble is a 58 y.o. male.     Patient is a Optician, dispensing.  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  He had no symptoms prior.  Feels fine now.  Patient had the defibrillator fire at this exact same church a year ago.  So he is convinced that their microphone system has something to do with it.  We will go ahead and interrogate his defibrillator its its made by [employer].  Patient feels fine now.  Will discuss with cardiology once we have the results.  Patient does have a history of nonischemic cardiomyopathy.  Has a history of ventricular fibrillation.  History of a cardiac arrest in August 2016.     Past Medical History:  Diagnosis Date  . Arthritis   . Chronic kidney disease   . Chronic systolic CHF (congestive heart failure) (HCC)    a. Echo 8/16:  EF 10%, diff HK, Gr 1 DD, mild dilated aortic root, mild MR, mild LAE, severely reduced RVSF  . Hypertension   . ICD (implantable cardioverter-defibrillator) in place   . NICM (nonischemic cardiomyopathy) (HCC)    a. LHC 8/16:  normal coronary arteries.   . VF (ventricular fibrillation) (HCC)    s/p OOH cardiac arrest 8/16 >> s/p ICD    Patient Active Problem List   Diagnosis Date Noted  . Defibrillator discharge 12/20/2017  . NSVT (nonsustained ventricular tachycardia) (HCC) 06/02/2015  . Toe pain, left 06/02/2015  . Chronic systolic CHF (congestive heart failure) (HCC) 12/27/2014  . AKI (acute kidney injury) (HCC) 11/25/2014  . Paroxysmal ventricular tachycardia (HCC) 11/17/2014  . Nonischemic cardiomyopathy (HCC) 11/17/2014  . Acute on chronic systolic heart failure, EF 10% 11/17/2014  . Hypokalemia 11/17/2014  . Hypomagnesemia 11/17/2014  . Aspiration pneumonia (HCC)   .  Central line clotted [facility])   . Hypoxia   . Cardiogenic shock (HCC)   . Sudden cardiac death (HCC) 12-07-14  . Cardiac arrest (HCC) 2014/12/07  . Acute respiratory failure with hypoxia (HCC) Dec 07, 2014    Past Surgical History:  Procedure Laterality Date  . APPENDECTOMY    . CARDIAC CATHETERIZATION N/A 12-07-14   Procedure: Left Heart Cath and Coronary Angiography;  Surgeon: [name], MD;  Location: [facility] INVASIVE CV LAB;  Service: Cardiovascular;  Laterality: N/A;  . EP IMPLANTABLE DEVICE N/A 11/16/2014   Procedure: ICD Implant;  Surgeon: [name], MD;  Location[facility] INVASIVE CV LAB;  Service: Cardiovascular;  Laterality: N/A;  . RIGHT HEART CATH AND CORONARY ANGIOGRAPHY N/A 01/30/2018   Procedure: RIGHT HEART CATH AND CORONARY ANGIOGRAPHY;  Surgeon: Laurey Morale, MD;  Location: [facility] INVASIVE CV LAB;  Service: Cardiovascular;  Laterality: N/A;  . ULTRASOUND GUIDANCE FOR VASCULAR ACCESS  01/30/2018   Procedure: Ultrasound Guidance For Vascular Access;  Surgeon: Laurey Morale, MD;  Location: [facility] INVASIVE CV LAB;  Service: Cardiovascular;;        Home Medications    Prior to Admission medications   Medication Sig Start Date End Date Taking? Authorizing Provider  aspirin EC 81 MG tablet Take 81 mg by mouth daily.   Yes [provider]  carvedilol (COREG) 12.5 MG tablet TAKE 1.5 TABLETS (18.75 MG TOTAL) BY MOUTH 2 (TWO) TIMES  DAILY. 08/20/18  Yes Laurey MoraleMcLean, Dalton S, MD  colchicine 0.6 MG tablet TAKE ONE TABLET BY MOUTH ONCE DAILY AS NEEDED FOR FLARE UPS Patient taking differently: Take 0.6 mg by mouth daily as needed (gout).  04/26/17  Yes Laurey MoraleMcLean, Dalton S, MD  ENTRESTO 49-51 MG TAKE 1 TABLET BY MOUTH TWICE A DAY Patient taking differently: Take 1 tablet by mouth 2 (two) times daily.  08/20/18  Yes Laurey MoraleMcLean, Dalton S, MD  eplerenone (INSPRA) 25 MG tablet TAKE 1 TABLET BY MOUTH DAILY Patient taking differently: Take 25 mg by mouth every morning.  05/09/18  Yes Clegg, Amy D,  NP  furosemide (LASIX) 40 MG tablet Take 0.5 tablets (20 mg total) by mouth every other day. 04/25/18  Yes Laurey MoraleMcLean, Dalton S, MD  lovastatin (MEVACOR) 20 MG tablet Take 20 mg by mouth every morning.    Yes [provider]  magnesium oxide (MAG-OX) 400 MG tablet Take 400 mg by mouth 2 (two) times a day.    Yes [provider]  sildenafil (REVATIO) 20 MG tablet TAKE ONE-HALF (1/2) TABLET BY MOUTH ONCEDAILY AS NEEDED FOR ERECTILE DYSFUNCTION Patient taking differently: Take 10 mg by mouth See admin instructions. TAKE ONE-HALF (1/2) TABLET BY MOUTH ONCEDAILY AS NEEDED FOR ERECTILE DYSFUNCTION 07/24/17  Yes Laurey MoraleMcLean, Dalton S, MD    Family History Family History  Problem Relation Age of Onset  . Hypertension Father   . Sudden death Father   . Hypertension Mother   . Heart disease Sister   . Heart disease Sister   . Hypertension Sister   . Hypertension Sister   . Hypertension Brother   . Hypertension Brother   . Hypertension Brother   . Hypertension Brother   . Hypertension Sister     Social History Social History   Tobacco Use  . Smoking status: Never Smoker  . Smokeless tobacco: Never Used  Substance Use Topics  . Alcohol use: No  . Drug use: No     Allergies   Patient has no known allergies.   Review of Systems Review of Systems  Constitutional: Negative for chills and fever.  HENT: Negative for congestion, rhinorrhea and sore throat.   Eyes: Negative for visual disturbance.  Respiratory: Negative for cough and shortness of breath.   Cardiovascular: Negative for chest pain and leg swelling.  Gastrointestinal: Negative for abdominal pain, diarrhea, nausea and vomiting.  Genitourinary: Negative for dysuria.  Musculoskeletal: Negative for back pain and neck pain.  Skin: Negative for rash.  Neurological: Negative for dizziness, light-headedness and headaches.  Hematological: Does not bruise/bleed easily.  Psychiatric/Behavioral: Negative for confusion.      Physical Exam Updated Vital Signs BP 120/75   Pulse (!) 54   Temp 98.8 F (37.1 C) (Oral)   Resp 13   SpO2 97%   Physical Exam Vitals signs and nursing note reviewed.  Constitutional:      Appearance: Normal appearance. He is well-developed.  HENT:     Head: Normocephalic and atraumatic.  Eyes:     Extraocular Movements: Extraocular movements intact.     Conjunctiva/sclera: Conjunctivae normal.     Pupils: Pupils are equal, round, and reactive to light.  Neck:     Musculoskeletal: Neck supple.  Cardiovascular:     Rate and Rhythm: Normal rate and regular rhythm.     Heart sounds: No murmur.  Pulmonary:     Effort: Pulmonary effort is normal. No respiratory distress.     Breath sounds: Normal breath sounds.  Abdominal:  Palpations: Abdomen is soft.     Tenderness: There is no abdominal tenderness.  Musculoskeletal: Normal range of motion.        General: No swelling.  Skin:    General: Skin is warm and dry.  Neurological:     General: No focal deficit present.     Mental Status: He is alert and oriented to person, place, and time.      ED Treatments / Results  Labs (all labs ordered are listed, but only abnormal results are displayed) Labs Reviewed  BASIC METABOLIC PANEL - Abnormal; Notable for the following components:      Result Value   Glucose, Bld 122 (*)    Creatinine, Ser 1.27 (*)    All other components within normal limits  CBC  TROPONIN I (HIGH SENSITIVITY)  TROPONIN I (HIGH SENSITIVITY)    EKG EKG Interpretation  Date/Time:  Sunday October 20 2018 19:48:19 EDT Ventricular Rate:  85 PR Interval:  228 QRS Duration: 142 QT Interval:  386 QTC Calculation: 459 R Axis:   -60 Text Interpretation:  Sinus rhythm with 1st degree A-V block Possible Left atrial enlargement Left axis deviation Non-specific intra-ventricular conduction block Inferior infarct , age undetermined Cannot rule out Anterior infarct , age undetermined T wave abnormality,  consider lateral ischemia Abnormal ECG Confirmed by Fredia Sorrow [phone]) on 10/20/2018 10:28:08 PM   Radiology Dg Chest 2 View  Result Date: 10/20/2018 CLINICAL DATA:  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 Pt denies chest pain, sob. defib went off EXAM: CHEST - 2 VIEW COMPARISON:  None. FINDINGS: LEFT-sided fibrillator. Normal cardiac silhouette. Normal mediastinum and cardiac silhouette. Normal pulmonary vasculature. No evidence of effusion, infiltrate, or pneumothorax. No acute bony abnormality. IMPRESSION: No acute cardiopulmonary process. Electronically Signed   By: Suzy Bouchard M.D.   On: 10/20/2018 20:24    Procedures Procedures (including critical care time)  Medications Ordered in ED Medications  sodium chloride flush (NS) 0.9 % injection 3 mL (3 mLs Intravenous Not Given 10/20/18 2208)     Initial Impression / Assessment and Plan / ED Course  I have reviewed the triage vital signs and the nursing notes.  Pertinent labs & imaging results that were available during my care of the patient were reviewed by me and considered in my medical decision making (see chart for details).       Will have defibrillator interrogated.  Based on results we will discuss with cardiology.  Patient currently stable.  Patient's labs are fine including his troponins.  EKG without anything acute.   Final Clinical Impressions(s) / ED Diagnoses   Final diagnoses:  Defibrillator discharge    ED Discharge Orders    None       Fredia Sorrow, MD 10/21/18 [phone]

## 2018-10-21 NOTE — Telephone Encounter (Signed)
Transmission received. No treated VT/VF episodes. 2 monomorphic VT-NS episodes on 10/21/18 at 09:13 and 09:14. Presenting rhythm Vs @ 66bpm.  Per recommendations from Tommye Standard, [place], plan to schedule pt for f/u with Dr. Lovena Le (DOD) on 10/23/18 at 2:00pm. Continue current meds, give ED precautions.  Spoke with pt's wife at pt's request. She initially declined appointment on 8/5 as she will have to take time off of work to bring pt in, but this RN was able to convey the importance of close f/u at this time. Pt's wife agreeable to this appointment and verbalizes understanding of ED precautions for any cardiac symptoms, additional shocks, or syncope prior to this visit.   Called back to offer sooner opening with Dr. Curt Bears on 10/22/18 at 3:30pm (DOD slot). Pt's wife declines, reports she was able to work out pt's transportation for Wednesday afternoon. Again verbalizes understanding of ED precautions in the interim. No further questions at this time.  Spoke with lab at [facility] to add-on Mag to labs drawn last night per verbal order from R. Charlcie Cradle, [place].

## 2018-10-21 NOTE — Telephone Encounter (Addendum)
Discussed with Lynnell Dike, PA, who recommends review by EP MD. Reached out to Dr. Lovena Le. Will also request manual transmission from ICD monitor.  Spoke with wife. She and the pt are not home right now. They will send a transmission in about an hour.

## 2018-10-21 NOTE — Consult Note (Addendum)
Cardiology Consultation:   Patient ID: Brian Noble; [id number]; June 04, 1960   Admit date: 10/20/2018 Date of Consult: 10/21/2018  Primary Care Provider: [name], MD Primary Cardiologist: No primary care provider on file. Primary Electrophysiologist:  None  Chief Complaint: ICD shock  Patient Profile:   Brian Noble is a 58 y.o. male with a hx of nonischemic cardiomyopathy, VT status post ICD who presents with ICD shock.  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  He denies any preceding symptoms including palpitations lightheadedness or chest pain.  He presented to the emergency room for further evaluation.  Basic labs were drawn and were normal.  ECG was unchanged from prior.  Patient denies any symptoms currently or in the past several days including chest pain, dyspnea, lower extremity edema.  His device was interrogated in the emergency room which demonstrated sustained monomorphic VT that was treated with ATP and converted into runs of nonsustained VT.  The ICD shock occurred during frequent runs of nonsustained VT.  Past Medical History:  Diagnosis Date  . Arthritis   . Chronic kidney disease   . Chronic systolic CHF (congestive heart failure) ([place])    a. Echo 8/16:  EF 10%, diff HK, Gr 1 DD, mild dilated aortic root, mild MR, mild LAE, severely reduced RVSF  . Hypertension   . ICD (implantable cardioverter-defibrillator) in place   . NICM (nonischemic cardiomyopathy) ([place])    a. LHC 8/16:  normal coronary arteries.   . VF (ventricular fibrillation) ([place])    s/p OOH cardiac arrest 8/16 >> s/p ICD    Past Surgical History:  Procedure Laterality Date  . APPENDECTOMY    . CARDIAC CATHETERIZATION N/A 11/10/2014   Procedure: Left Heart Cath and Coronary Angiography;  Surgeon: Lorretta Harp, MD;  Location: [place] [facility];  Service: Cardiovascular;  Laterality: N/A;  . EP IMPLANTABLE DEVICE N/A 11/16/2014    Procedure: ICD Implant;  Surgeon: Deboraha Sprang, MD;  Location: [place] [facility];  Service: Cardiovascular;  Laterality: N/A;  . RIGHT HEART CATH AND CORONARY ANGIOGRAPHY N/A 01/30/2018   Procedure: RIGHT HEART CATH AND CORONARY ANGIOGRAPHY;  Surgeon: Larey Dresser, MD;  Location: [place] [facility];  Service: Cardiovascular;  Laterality: N/A;  . ULTRASOUND GUIDANCE FOR VASCULAR ACCESS  01/30/2018   Procedure: Ultrasound Guidance For Vascular Access;  Surgeon: Larey Dresser, MD;  Location: [place] [facility];  Service: Cardiovascular;;     Inpatient Medications: Scheduled Meds: . sodium chloride flush  3 mL Intravenous Once   Continuous Infusions:  PRN Meds:   Home Meds: Prior to Admission medications   Medication Sig Start Date End Date Taking? Authorizing Provider  aspirin EC 81 MG tablet Take 81 mg by mouth daily.   Yes [provider]  carvedilol (COREG) 12.5 MG tablet TAKE 1.5 TABLETS (18.75 MG TOTAL) BY MOUTH 2 (TWO) TIMES DAILY. 08/20/18  Yes Larey Dresser, MD  colchicine 0.6 MG tablet TAKE ONE TABLET BY MOUTH ONCE DAILY AS NEEDED FOR FLARE UPS Patient taking differently: Take 0.6 mg by mouth daily as needed (gout).  04/26/17  Yes Larey Dresser, MD  ENTRESTO 49-51 MG TAKE 1 TABLET BY MOUTH TWICE A DAY Patient taking differently: Take 1 tablet by mouth 2 (two) times daily.  08/20/18  Yes Larey Dresser, MD  eplerenone (INSPRA) 25 MG tablet TAKE 1 TABLET BY MOUTH DAILY Patient taking differently: Take 25  mg by mouth every morning.  05/09/18  Yes Clegg, Amy D, NP  furosemide (LASIX) 40 MG tablet Take 0.5 tablets (20 mg total) by mouth every other day. 04/25/18  Yes Laurey MoraleMcLean, Dalton S, MD  lovastatin (MEVACOR) 20 MG tablet Take 20 mg by mouth every morning.    Yes [provider]  magnesium oxide (MAG-OX) 400 MG tablet Take 400 mg by mouth 2 (two) times a day.    Yes [provider]  sildenafil (REVATIO) 20 MG tablet TAKE ONE-HALF (1/2) TABLET BY  MOUTH ONCEDAILY AS NEEDED FOR ERECTILE DYSFUNCTION Patient taking differently: Take 10 mg by mouth See admin instructions. TAKE ONE-HALF (1/2) TABLET BY MOUTH ONCEDAILY AS NEEDED FOR ERECTILE DYSFUNCTION 07/24/17  Yes Laurey MoraleMcLean, Dalton S, MD    Allergies:   No Known Allergies  Social History:   Social History   Socioeconomic History  . Marital status: Married    Spouse name: Not on file  . Number of children: 2  . Years of education: 2912  . Highest education level: 12th grade  Occupational History  . Not on file  Social Needs  . Financial resource strain: Not hard at all  . Food insecurity    Worry: Never true    Inability: Never true  . Transportation needs    Medical: No    Non-medical: No  Tobacco Use  . Smoking status: Never Smoker  . Smokeless tobacco: Never Used  Substance and Sexual Activity  . Alcohol use: No  . Drug use: No  . Sexual activity: Not on file  Lifestyle  . Physical activity    Days per week: Not on file    Minutes per session: Not on file  . Stress: Not on file  Relationships  . Social Musicianconnections    Talks on phone: Not on file    Gets together: Not on file    Attends religious service: Not on file    Active member of club or organization: Not on file    Attends meetings of clubs or organizations: Not on file    Relationship status: Not on file  . Intimate partner violence    Fear of current or ex partner: Not on file    Emotionally abused: Not on file    Physically abused: Not on file    Forced sexual activity: Not on file  Other Topics Concern  . Not on file  Social History Narrative  . Not on file     Family History:    Family History  Problem Relation Age of Onset  . Hypertension Father   . Sudden death Father   . Hypertension Mother   . Heart disease Sister   . Heart disease Sister   . Hypertension Sister   . Hypertension Sister   . Hypertension Brother   . Hypertension Brother   . Hypertension Brother   . Hypertension Brother    . Hypertension Sister       ROS:  Please see the history of present illness.   All other ROS reviewed and negative.     Physical Exam/Data:   Vitals:   10/20/18 2245 10/20/18 2300 10/20/18 2330 10/21/18 0015  BP: 135/84 132/90 120/75 137/84  Pulse: (!) 57 (!) 57 (!) 54 60  Resp: 14 20 13 17   Temp:      TempSrc:      SpO2: 98% 100% 97% 96%   No intake or output data in the 24 hours ending 10/21/18 0043 Last 3  Weights 07/29/2018 07/25/2018 04/25/2018  Weight (lbs) 190 lb 190 lb 199 lb 6.4 oz  Weight (kg) 86.183 kg 86.183 kg 90.447 kg     There is no height or weight on file to calculate BMI.  General: Well developed, well nourished, in no acute distress. Head: Normocephalic, atraumatic, sclera non-icteric, no xanthomas, nares are without discharge.  Neck: Negative for carotid bruits. JVD not elevated. Lungs: Clear bilaterally to auscultation without wheezes, rales, or rhonchi. Breathing is unlabored. Heart: RRR with S1 S2. No murmurs, rubs, or gallops appreciated. Abdomen: Soft, non-tender, non-distended with normoactive bowel sounds. No hepatomegaly. No rebound/guarding. No obvious abdominal masses. Msk:  Strength and tone appear normal for age. Extremities: No clubbing or cyanosis. No edema.  Distal pedal pulses are 2+ and equal bilaterally. Neuro: Alert and oriented X 3. No facial asymmetry. No focal deficit. Moves all extremities spontaneously. Psych:  Responds to questions appropriately with a normal affect.   EKG:  The EKG was personally reviewed and demonstrates:  Sinus rhythm left axis deviation IVCD  Relevant CV Studies: Echo 12/2017 - Left ventricle: The cavity size was mildly dilated. Wall   thickness was normal. Systolic function was severely reduced. The   estimated ejection fraction was in the range of 20% to 25%.   Doppler parameters are consistent with abnormal left ventricular   relaxation (grade 1 diastolic dysfunction). - Aortic valve: There was trivial  regurgitation.  Laboratory Data:  High Sensitivity Troponin:   Recent Labs  Lab 10/20/18 1943 10/20/18 2130  TROPONINIHS 5 7     Cardiac EnzymesNo results for input(s): TROPONINI in the last 168 hours. No results for input(s): TROPIPOC in the last 168 hours.  Chemistry Recent Labs  Lab 10/20/18 1943  NA 138  K 4.0  CL 108  CO2 22  GLUCOSE 122*  BUN 17  CREATININE 1.27*  CALCIUM 9.3  GFRNONAA >60  GFRAA >60  ANIONGAP 8    No results for input(s): PROT, ALBUMIN, AST, ALT, ALKPHOS, BILITOT in the last 168 hours. Hematology Recent Labs  Lab 10/20/18 1943  WBC 7.4  RBC 5.09  HGB 15.2  HCT 47.5  MCV 93.3  MCH 29.9  MCHC 32.0  RDW 12.4  PLT 212   BNPNo results for input(s): BNP, PROBNP in the last 168 hours.  DDimer No results for input(s): DDIMER in the last 168 hours.   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 Pt denies chest pain, sob. defib went off EXAM: CHEST - 2 VIEW COMPARISON:  None. FINDINGS: LEFT-sided fibrillator. Normal cardiac silhouette. Normal mediastinum and cardiac silhouette. Normal pulmonary vasculature. No evidence of effusion, infiltrate, or pneumothorax. No acute bony abnormality. IMPRESSION: No acute cardiopulmonary process. Electronically Signed   By: Genevive Bi M.D.   On: 10/20/2018 20:24    Assessment and Plan:   1. Ventricular tachycardia Device interrogation demonstrated sustained monomorphic VT that was treated with ATP and converted into runs of nonsustained VT.  The ICD shock occurred during runs of  nonsustained VT.  The frequent NSVT eventually spontaneously terminated.    Given isolated ICD shock with no other symptoms, patient can be discharged from the ED with close follow up.  Advised him to call our office in the morning.  Would consider initiation of antiarrhythmic given history of VT with ICD shocks in the past.   For  questions or updates, please contact [facility] Please consult [email or web address]  for contact info under    Signed, Electa SniffBARNETT, Zaela Graley S, MD  10/21/2018 12:43 AM

## 2018-10-22 NOTE — Addendum Note (Signed)
Encounter addended by: Jan Fireman D on: 10/22/2018 10:16 AM  Actions taken: Order list changed, Diagnosis association updated

## 2018-10-22 NOTE — Telephone Encounter (Signed)
Spoke w/ Maudie Mercury at [facility] main lab, magnesium order was cancelled from 10/21/18. Reordered today 10/22/18. Maudie Mercury was unable to run the lab d/t missing specimen. Asked Maudie Mercury to cancel magnesium lab that was ordered for 10/22/18.

## 2018-10-23 ENCOUNTER — Other Ambulatory Visit: Payer: Self-pay

## 2018-10-23 ENCOUNTER — Encounter: Payer: Self-pay | Admitting: Internal Medicine

## 2018-10-23 ENCOUNTER — Ambulatory Visit (INDEPENDENT_AMBULATORY_CARE_PROVIDER_SITE_OTHER): Payer: BLUE CROSS/BLUE SHIELD | Admitting: Internal Medicine

## 2018-10-23 VITALS — BP 128/80 | HR 62 | Ht 68.0 in | Wt 210.0 lb

## 2018-10-23 DIAGNOSIS — Z95810 Presence of automatic (implantable) cardiac defibrillator: Secondary | ICD-10-CM | POA: Diagnosis not present

## 2018-10-23 DIAGNOSIS — I428 Other cardiomyopathies: Secondary | ICD-10-CM

## 2018-10-23 DIAGNOSIS — I472 Ventricular tachycardia: Secondary | ICD-10-CM | POA: Diagnosis not present

## 2018-10-23 DIAGNOSIS — I5022 Chronic systolic (congestive) heart failure: Secondary | ICD-10-CM | POA: Diagnosis not present

## 2018-10-23 DIAGNOSIS — I4729 Other ventricular tachycardia: Secondary | ICD-10-CM

## 2018-10-23 MED ORDER — MEXILETINE HCL 200 MG PO CAPS: 200.0000 mg | ORAL_CAPSULE | Freq: Two times a day (BID) | ORAL | 11 refills | Status: DC

## 2018-10-23 NOTE — Progress Notes (Signed)
HPI Mr. Chilton SiGreen returns today for followup. He is a pleasant middle aged man with a h/o chronic systolic heart failure, s/p ICD Insertion. 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 He returns today for evaluation. Review of his ICD interrogation demonstrates salvos of VT with ATP producing a second VT and for which he was shocked. He had a dirty break. He did not have syncope. He feels well otherwise. He has not been on any AA drug therapy.   No Known Allergies   Current Outpatient Medications  Medication Sig Dispense Refill  . aspirin EC 81 MG tablet Take 81 mg by mouth daily.    . carvedilol (COREG) 12.5 MG tablet TAKE 1.5 TABLETS (18.75 MG TOTAL) BY MOUTH 2 (TWO) TIMES DAILY. 273 tablet 3  . colchicine 0.6 MG tablet TAKE ONE TABLET BY MOUTH ONCE DAILY AS NEEDED FOR FLARE UPS (Patient taking differently: Take 0.6 mg by mouth daily as needed (gout). ) 15 tablet 0  . ENTRESTO 49-51 MG TAKE 1 TABLET BY MOUTH TWICE A DAY (Patient taking differently: Take 1 tablet by mouth 2 (two) times daily. ) 180 tablet 3  . eplerenone (INSPRA) 25 MG tablet TAKE 1 TABLET BY MOUTH DAILY (Patient taking differently: Take 25 mg by mouth every morning. ) 30 tablet 11  . furosemide (LASIX) 40 MG tablet Take 0.5 tablets (20 mg total) by mouth every other day. 30 tablet 0  . lovastatin (MEVACOR) 20 MG tablet Take 20 mg by mouth every morning.     . magnesium oxide (MAG-OX) 400 MG tablet Take 400 mg by mouth 2 (two) times a day.     . sildenafil (REVATIO) 20 MG tablet TAKE ONE-HALF (1/2) TABLET BY MOUTH ONCEDAILY AS NEEDED FOR ERECTILE DYSFUNCTION (Patient taking differently: Take 10 mg by mouth See admin instructions. TAKE ONE-HALF (1/2) TABLET BY MOUTH ONCEDAILY AS NEEDED FOR ERECTILE DYSFUNCTION) 10 tablet 1   No current facility-administered medications for this visit.      Past Medical History:  Diagnosis Date  . Arthritis   . Chronic kidney disease   . Chronic systolic CHF  (congestive heart failure) ([facility])    a. Echo 8/16:  EF 10%, diff HK, Gr 1 DD, mild dilated aortic root, mild MR, mild LAE, severely reduced RVSF  . Hypertension   . ICD (implantable cardioverter-defibrillator) in place   . NICM (nonischemic cardiomyopathy) ([facility])    a. LHC 8/16:  normal coronary arteries.   . VF (ventricular fibrillation) ([facility])    s/p OOH cardiac arrest 8/16 >> s/p ICD    ROS:   All systems reviewed and negative except as noted in the HPI.   Past Surgical History:  Procedure Laterality Date  . APPENDECTOMY    . CARDIAC CATHETERIZATION N/A 11/10/2014   Procedure: Left Heart Cath and Coronary Angiography;  Surgeon: Runell GessJonathan J Berry, MD;  [facility];  Service: Cardiovascular;  Laterality: N/A;  . EP IMPLANTABLE DEVICE N/A 11/16/2014   Procedure: ICD Implant;  Surgeon: Duke SalviaSteven C Klein, MD;  [facility];  Service: Cardiovascular;  Laterality: N/A;  . RIGHT HEART CATH AND CORONARY ANGIOGRAPHY N/A 01/30/2018   Procedure: RIGHT HEART CATH AND CORONARY ANGIOGRAPHY;  Surgeon: Laurey MoraleMcLean, Dalton S, MD;  [facility];  Service: Cardiovascular;  Laterality: N/A;  . ULTRASOUND GUIDANCE FOR VASCULAR ACCESS  01/30/2018   Procedure: Ultrasound Guidance For Vascular Access;  Surgeon: Laurey MoraleMcLean, Dalton S,  MD;  Location: [place] [facility];  Service: Cardiovascular;;     Family History  Problem Relation Age of Onset  . Hypertension Father   . Sudden death Father   . Hypertension Mother   . Heart disease Sister   . Heart disease Sister   . Hypertension Sister   . Hypertension Sister   . Hypertension Brother   . Hypertension Brother   . Hypertension Brother   . Hypertension Brother   . Hypertension Sister      Social History   Socioeconomic History  . Marital status: Married    Spouse name: Not on file  . Number of children: 2  . Years of education: 70  . Highest education level: 12th grade  Occupational History  . Not on file   Social Needs  . Financial resource strain: Not hard at all  . Food insecurity    Worry: Never true    Inability: Never true  . Transportation needs    Medical: No    Non-medical: No  Tobacco Use  . Smoking status: Never Smoker  . Smokeless tobacco: Never Used  Substance and Sexual Activity  . Alcohol use: No  . Drug use: No  . Sexual activity: Not on file  Lifestyle  . Physical activity    Days per week: Not on file    Minutes per session: Not on file  . Stress: Not on file  Relationships  . Social Herbalist on phone: Not on file    Gets together: Not on file    Attends religious service: Not on file    Active member of club or organization: Not on file    Attends meetings of clubs or organizations: Not on file    Relationship status: Not on file  . Intimate partner violence    Fear of current or ex partner: Not on file    Emotionally abused: Not on file    Physically abused: Not on file    Forced sexual activity: Not on file  Other Topics Concern  . Not on file  Social History Narrative  . Not on file     BP 128/80   Pulse 62   Ht 5\' 8"  (1.727 m)   Wt 210 lb (95.3 kg)   SpO2 96%   BMI 31.93 kg/m   Physical Exam:  Well appearing middle aged man, NAD HEENT: Unremarkable Neck:  No JVD, no thyromegally Lymphatics:  No adenopathy Back:  No CVA tenderness Lungs:  Clear with no wheezes HEART:  Regular rate rhythm, no murmurs, no rubs, no clicks Abd:  soft, positive bowel sounds, no organomegally, no rebound, no guarding Ext:  2 plus pulses, no edema, no cyanosis, no clubbing Skin:  No rashes no nodules Neuro:  CN II through XII intact, motor grossly intact    DEVICE  Normal device function.  See PaceArt for details.   Assess/Plan: 1. VT - I have discussed the treatment options. He feels well otherwise. I have recommended he undergo a trial of mexilitine and we will see how he does. I considered increasing the coreg but he notes that when his  coreg has been increased in the past he feels poorly.  2. ICD - his medtronic device appears to be working normally. 3. Chronic systolic heart failure - he denies any symptoms of worsening and his fluid index looks good.  4. HTN - his bp is controlled. No change. He will maintain a low sodium intake  and his current meds.   Brian Noble.D.

## 2018-10-23 NOTE — Patient Instructions (Addendum)
Medication Instructions:  Your physician has recommended you make the following change in your medication:   1.  Start taking mexitil 200 mg--one tablet by mouth twice a day  Labwork: None ordered.  Testing/Procedures: None ordered.  Follow-Up: Your physician wants you to follow-up in: 6 weeks with Dr. [name].      Remote monitoring is used to monitor your ICD from home. This monitoring reduces the number of office visits required to check your device to one time per year. It allows [place] to keep an eye on the functioning of your device to ensure it is working properly. You are scheduled for a device check from home on 11/13/2018. You may send your transmission at any time that day. If you have a wireless device, the transmission will be sent automatically. After your physician reviews your transmission, you will receive a postcard with your next transmission date.  Any Other Special Instructions Will Be Listed Below (If Applicable).  If you need a refill on your cardiac medications before your next appointment, please call your pharmacy.

## 2018-10-28 ENCOUNTER — Telehealth: Payer: Self-pay | Admitting: Internal Medicine

## 2018-10-28 ENCOUNTER — Telehealth (HOSPITAL_COMMUNITY): Payer: Self-pay | Admitting: *Deleted

## 2018-10-28 MED ORDER — MEXILETINE HCL 200 MG PO CAPS: 200.0000 mg | ORAL_CAPSULE | Freq: Two times a day (BID) | ORAL | 11 refills | Status: DC

## 2018-10-28 NOTE — Telephone Encounter (Signed)
Pts wife left VM stating patients device went off last week and she took patient to EP doctor. They prescribed a medication but the pharmacy wont fill it. She said Dr.Klein is out for 6 weeks and needed someone to assist her. I looked at the last office note from EP and pt saw Dr.Taylor on 8/5. I left detailed VM advising Mrs.Lininger to contact EP office regarding medication as our office did not order medication.

## 2018-10-28 NOTE — Telephone Encounter (Signed)
Called pt's pharmacy and [facility] is on back order for [facility]ilitine. Pharmacist encouraged pt to look at other pharmacies in the area for supply. Pt's wife agreed to have script sent into [facility] [address] in [place]. I will send in a 30 day script to start. She verbalized understanding and had no additional questions.

## 2018-10-28 NOTE — Telephone Encounter (Signed)
Per pt okay to speak with wife re message Per wife pharmacists informed wife Mextil is not available and is going to be hard to get not sure why .Pharmacists called other pharmacies and some  did have few pills but didn't think would be able to get any future shipments . Will forward to Dr [name] for review and recommendations to see if pt needs to try another med./cy

## 2018-10-28 NOTE — Telephone Encounter (Signed)
New message:    Patient wife calling concering some medication. Please call patient wife.

## 2018-11-01 LAB — CUP PACEART INCLINIC DEVICE CHECK
Battery Remaining Longevity: 99 mo
Battery Voltage: 2.94 V
Brady Statistic RV Percent Paced: 0.02 %
Date Time Interrogation Session: 20200805173207
HighPow Impedance: 72 Ohm
Implantable Lead Implant Date: 20160829
Implantable Lead Location: 753860
Implantable Pulse Generator Implant Date: 20160829
Lead Channel Impedance Value: 399 Ohm
Lead Channel Impedance Value: 494 Ohm
Lead Channel Pacing Threshold Amplitude: 0.625 V
Lead Channel Pacing Threshold Pulse Width: 0.4 ms
Lead Channel Sensing Intrinsic Amplitude: 24 mV
Lead Channel Sensing Intrinsic Amplitude: 24.25 mV
Lead Channel Setting Pacing Amplitude: 2 V
Lead Channel Setting Pacing Pulse Width: 0.4 ms
Lead Channel Setting Sensing Sensitivity: 0.3 mV

## 2018-11-02 NOTE — Telephone Encounter (Signed)
noted 

## 2018-11-13 ENCOUNTER — Ambulatory Visit (INDEPENDENT_AMBULATORY_CARE_PROVIDER_SITE_OTHER): Payer: BLUE CROSS/BLUE SHIELD | Admitting: *Deleted

## 2018-11-13 DIAGNOSIS — I428 Other cardiomyopathies: Secondary | ICD-10-CM

## 2018-11-14 LAB — CUP PACEART REMOTE DEVICE CHECK
Battery Remaining Longevity: 89 mo
Battery Voltage: 3.01 V
Brady Statistic RV Percent Paced: 0.02 %
Date Time Interrogation Session: 20200827023628
HighPow Impedance: 70 Ohm
Implantable Lead Implant Date: 20160829
Implantable Lead Location: 753860
Implantable Pulse Generator Implant Date: 20160829
Lead Channel Impedance Value: 380 Ohm
Lead Channel Impedance Value: 456 Ohm
Lead Channel Pacing Threshold Amplitude: 0.625 V
Lead Channel Pacing Threshold Pulse Width: 0.4 ms
Lead Channel Sensing Intrinsic Amplitude: 22.875 mV
Lead Channel Sensing Intrinsic Amplitude: 22.875 mV
Lead Channel Setting Pacing Amplitude: 2 V
Lead Channel Setting Pacing Pulse Width: 0.4 ms
Lead Channel Setting Sensing Sensitivity: 0.3 mV

## 2018-11-21 ENCOUNTER — Encounter: Payer: Self-pay | Admitting: Cardiology

## 2018-11-21 NOTE — Progress Notes (Signed)
Remote ICD transmission.   

## 2018-11-23 ENCOUNTER — Telehealth: Payer: Self-pay | Admitting: Cardiology

## 2018-11-23 NOTE — Telephone Encounter (Signed)
Pt's wife called and asked if we could refill pt's PRN colchicine for gout- 15 tablets.  I told I would do it this one time but further refills should come from his PCP.  Kerin Ransom PA-C 11/23/2018 10:22 AM

## 2018-12-11 ENCOUNTER — Encounter: Payer: BLUE CROSS/BLUE SHIELD | Admitting: Internal Medicine

## 2018-12-23 ENCOUNTER — Telehealth: Payer: Self-pay | Admitting: Internal Medicine

## 2018-12-23 NOTE — Telephone Encounter (Signed)
I spoke with pt's wife and confirmed pt does not have physical or cognitive impairment. She reports he does not like elevators.  Wife then stated she would like to cancel appointment due to not being able to get off work to bring him to this appointment.  I cancelled appointment for 10/14 She would like to reschedule. Will ask EP scheduling team to contact her to reschedule appointment.

## 2018-12-23 NOTE — Telephone Encounter (Signed)
New Message   Patient afraid to ride the elevator by himself and wants wife to come to appointment with him.  Please call and discuss.

## 2018-12-31 ENCOUNTER — Encounter: Payer: Self-pay | Admitting: Internal Medicine

## 2018-12-31 NOTE — Telephone Encounter (Signed)
Follow up    I made several attempts to call the patient and get appt RS. I mailed the patient a letter asking him to call and RS appt.

## 2019-01-01 ENCOUNTER — Encounter: Payer: BLUE CROSS/BLUE SHIELD | Admitting: Internal Medicine

## 2019-01-27 ENCOUNTER — Other Ambulatory Visit: Payer: Self-pay | Admitting: Nurse Practitioner

## 2019-01-27 DIAGNOSIS — I472 Ventricular tachycardia, unspecified: Secondary | ICD-10-CM

## 2019-02-12 ENCOUNTER — Ambulatory Visit (INDEPENDENT_AMBULATORY_CARE_PROVIDER_SITE_OTHER): Payer: BLUE CROSS/BLUE SHIELD | Admitting: *Deleted

## 2019-02-12 DIAGNOSIS — I469 Cardiac arrest, cause unspecified: Secondary | ICD-10-CM

## 2019-02-15 LAB — CUP PACEART REMOTE DEVICE CHECK
Battery Remaining Longevity: 96 mo
Battery Voltage: 2.99 V
Brady Statistic RV Percent Paced: 0.01 %
Date Time Interrogation Session: 20201127174049
HighPow Impedance: 68 Ohm
Implantable Lead Implant Date: 20160829
Implantable Lead Location: 753860
Implantable Pulse Generator Implant Date: 20160829
Lead Channel Impedance Value: 380 Ohm
Lead Channel Impedance Value: 456 Ohm
Lead Channel Pacing Threshold Amplitude: 0.75 V
Lead Channel Pacing Threshold Pulse Width: 0.4 ms
Lead Channel Sensing Intrinsic Amplitude: 21.125 mV
Lead Channel Sensing Intrinsic Amplitude: 21.125 mV
Lead Channel Setting Pacing Amplitude: 2 V
Lead Channel Setting Pacing Pulse Width: 0.4 ms
Lead Channel Setting Sensing Sensitivity: 0.3 mV

## 2019-02-17 ENCOUNTER — Encounter: Payer: Self-pay | Admitting: Internal Medicine

## 2019-02-17 ENCOUNTER — Other Ambulatory Visit: Payer: Self-pay

## 2019-02-17 ENCOUNTER — Ambulatory Visit: Payer: BLUE CROSS/BLUE SHIELD | Admitting: Internal Medicine

## 2019-02-17 VITALS — BP 126/84 | HR 65 | Ht 68.0 in | Wt 209.2 lb

## 2019-02-17 DIAGNOSIS — I428 Other cardiomyopathies: Secondary | ICD-10-CM | POA: Diagnosis not present

## 2019-02-17 DIAGNOSIS — I472 Ventricular tachycardia, unspecified: Secondary | ICD-10-CM

## 2019-02-17 NOTE — Patient Instructions (Addendum)
Medication Instructions:  Your physician recommends that you continue on your current medications as directed. Please refer to the Current Medication list given to you today.  *If you need a refill on your cardiac medications before your next appointment, please call your pharmacy*  Labwork: None ordered  Testing/Procedures: None ordered  Follow-Up: Remote monitoring is used to monitor your Pacemaker or ICD from home. This monitoring reduces the number of office visits required to check your device to one time per year. It allows [place] to keep an eye on the functioning of your device to ensure it is working properly. You are scheduled for a device check from home on 05/14/2019. You may send your transmission at any time that day. If you have a wireless device, the transmission will be sent automatically. After your physician reviews your transmission, you will receive a postcard with your next transmission date.  At [facility], you and your health needs are our priority.  As part of our continuing mission to provide you with exceptional heart care, we have created designated Provider Care Teams.  These Care Teams include your primary Cardiologist (physician) and Advanced Practice Providers (APPs -  Physician Assistants and Nurse Practitioners) who all work together to provide you with the care you need, when you need it.  You will need a follow up appointment in 6 months.  Please call our office 2 months in advance to schedule this appointment.  You may see Dr [name] or one of the following Advanced Practice Providers on your designated Care Team:    Chanetta Marshall, NP  Tommye Standard, PA-C  [place]da [place], [place]  Thank you for choosing [facility]!!

## 2019-02-17 NOTE — Progress Notes (Signed)
Patient Care Team: [name], MD as PCP - General (Family Medicine)   HPI  Brian Noble is a 58 y.o. male Seen in follow-up for aborted cardiac arrest in the context of nonischemic cardiomyopathy and hypokalemia.  8/16>> ICD.  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  Egrams reviewed he was seen by Dr. Elliot Cousin who started him on mexiletine.\ No arrhythmias since then.  He is tolerating mexiletine  No chest pain or shortness of breath peripheral edema; has not returned to the Pulpit  Appropriate Therapy yes  8/20 VT/VFl (CL 220 msec>> failed ATP + shock---- dirty break with VT NS )  Inappropriate Therapy  Antiarrhythmics Date   mexiletine  8/20            DATE TEST EF   11/16    Echo 20 %   12/17    Echo 30 %   10/19 Echo  20-25%   11/19 LHC/RHC  No Obs CAD Normal filling press    Date Cr K Mg  12/17  1.17 5.0 2.1  8/20 1.27 4.0 2.0(5/20)     Tolerating meds without difficult;  however, he has noted breast tenderness.     Device History: ICD implanted8/16 for VT/VF History of appropriate therapy: Yes 8/20  History of AAD therapy: No    Records and Results Reviewed hosp records  Past Medical History:  Diagnosis Date  . Arthritis   . Chronic kidney disease   . Chronic systolic CHF (congestive heart failure) ([place])    a. Echo 8/16:  EF 10%, diff HK, Gr 1 DD, mild dilated aortic root, mild MR, mild LAE, severely reduced RVSF  . Hypertension   . ICD (implantable cardioverter-defibrillator) in place   . NICM (nonischemic cardiomyopathy) ([place])    a. LHC 8/16:  normal coronary arteries.   . VF (ventricular fibrillation) ([place])    s/p OOH cardiac arrest 8/16 >> s/p ICD    Past Surgical History:  Procedure Laterality Date  . APPENDECTOMY    . CARDIAC CATHETERIZATION N/A 11/10/2014   Procedure: Left Heart Cath and Coronary Angiography;  Surgeon: Lorretta Harp, MD;  Location: [facility];  Service: Cardiovascular;  Laterality: N/A;  . EP IMPLANTABLE DEVICE N/A 11/16/2014   Procedure: ICD Implant;  Surgeon: Deboraha Sprang, MD;  Location: [facility];  Service: Cardiovascular;  Laterality: N/A;  . RIGHT HEART CATH AND CORONARY ANGIOGRAPHY N/A 01/30/2018   Procedure: RIGHT HEART CATH AND CORONARY ANGIOGRAPHY;  Surgeon: Larey Dresser, MD;  Location: [facility];  Service: Cardiovascular;  Laterality: N/A;  . ULTRASOUND GUIDANCE FOR VASCULAR ACCESS  01/30/2018   Procedure: Ultrasound Guidance For Vascular Access;  Surgeon: Larey Dresser, MD;  Location: [facility];  Service: Cardiovascular;;    Current Outpatient Medications  Medication Sig Dispense Refill  . aspirin EC 81 MG tablet Take 81 mg by mouth daily.    . carvedilol (COREG) 12.5 MG tablet TAKE 1.5 TABLETS (18.75 MG TOTAL) BY MOUTH 2 (TWO) TIMES DAILY. 273 tablet 3  . colchicine 0.6 MG tablet TAKE ONE TABLET BY MOUTH ONCE DAILY AS NEEDED FOR FLARE UPS 15 tablet 0  . ENTRESTO 49-51 MG TAKE 1 TABLET BY MOUTH TWICE A DAY 180 tablet 3  . eplerenone (INSPRA) 25 MG tablet TAKE 1 TABLET BY MOUTH DAILY 30 tablet 11  . furosemide (LASIX)  40 MG tablet Take 0.5 tablets (20 mg total) by mouth every other day. 30 tablet 0  . lovastatin (MEVACOR) 20 MG tablet Take 20 mg by mouth every morning.     . magnesium oxide (MAG-OX) 400 MG tablet Take 400 mg by mouth 2 (two) times a day.     . mexiletine (MEXITIL) 200 MG capsule Take 1 capsule (200 mg total) by mouth 2 (two) times daily. 60 capsule 11  . sildenafil (REVATIO) 20 MG tablet TAKE ONE-HALF (1/2) TABLET BY MOUTH ONCEDAILY AS NEEDED FOR ERECTILE DYSFUNCTION 10 tablet 1   No current facility-administered medications for this visit.     No Known Allergies    Review of Systems negative except from HPI and PMH  Physical Exam BP 126/84   Pulse 65   Ht 5\' 8"  (1.727 m)   Wt 209 lb 3.2 oz (94.9 kg)   SpO2 94%   BMI 31.81 kg/m  Well developed and  nourished in no acute distress HENT normal Neck supple with JVP-flat Clear Regular rate and rhythm, no murmurs or gallops Abd-soft with active BS No Clubbing cyanosis edema Skin-warm and dry A & Oriented  Grossly normal sensory and motor function  ECG  Sinus   Assessment and  Plan  Aborted cardiac arrest  Ventricular tachycardia/flutter with appropriate therapy 8/20  Ventricular tachycardia-nonsustained  Nonischemic cardiomyopathy  IVCD  Gynecomastia and breast tenderness improved on eplerenone  High Risk Medication Surveillance-eplerenone  Implantable defibrillator-[employer]  The patient's device was interrogated.  The information was reviewed. No changes were made in the programming.     Patient has a significantly decreased burden of nonsustained ventricular tachycardia with introduction of mexiletine.  Moreover, the ventricular rates are slower by about 100 ms.  Tolerating the drug well.  We will continue.  Quite anxious about returning to the pulpit .   Continue other guideline directed medical therapy.  Euvolemic continue current meds We spent more than 50% of our >25 min visit in face to face counseling regarding the above  We spent more than 50% of our >25 min visit in face to face counseling regarding the above

## 2019-03-06 ENCOUNTER — Other Ambulatory Visit (HOSPITAL_COMMUNITY): Payer: Self-pay | Admitting: Adult Health

## 2019-04-29 ENCOUNTER — Other Ambulatory Visit: Payer: Self-pay | Admitting: Legal Medicine

## 2019-05-14 ENCOUNTER — Ambulatory Visit (INDEPENDENT_AMBULATORY_CARE_PROVIDER_SITE_OTHER): Payer: BLUE CROSS/BLUE SHIELD | Admitting: *Deleted

## 2019-05-14 DIAGNOSIS — I469 Cardiac arrest, cause unspecified: Secondary | ICD-10-CM

## 2019-05-14 LAB — CUP PACEART REMOTE DEVICE CHECK
Battery Remaining Longevity: 92 mo
Battery Voltage: 3 V
Brady Statistic RV Percent Paced: 0.01 %
Date Time Interrogation Session: 20210224012503
HighPow Impedance: 73 Ohm
Implantable Lead Implant Date: 20160829
Implantable Lead Location: 753860
Implantable Pulse Generator Implant Date: 20160829
Lead Channel Impedance Value: 380 Ohm
Lead Channel Impedance Value: 437 Ohm
Lead Channel Pacing Threshold Amplitude: 0.75 V
Lead Channel Pacing Threshold Pulse Width: 0.4 ms
Lead Channel Sensing Intrinsic Amplitude: 24.125 mV
Lead Channel Sensing Intrinsic Amplitude: 24.125 mV
Lead Channel Setting Pacing Amplitude: 2 V
Lead Channel Setting Pacing Pulse Width: 0.4 ms
Lead Channel Setting Sensing Sensitivity: 0.3 mV

## 2019-05-15 NOTE — Progress Notes (Signed)
ICD Remote  

## 2019-05-18 ENCOUNTER — Other Ambulatory Visit: Payer: Self-pay | Admitting: Legal Medicine

## 2019-05-18 DIAGNOSIS — I5022 Chronic systolic (congestive) heart failure: Secondary | ICD-10-CM

## 2019-05-19 ENCOUNTER — Other Ambulatory Visit: Payer: Self-pay

## 2019-05-19 ENCOUNTER — Telehealth: Payer: Self-pay

## 2019-05-19 ENCOUNTER — Other Ambulatory Visit: Payer: BLUE CROSS/BLUE SHIELD | Admitting: *Deleted

## 2019-05-19 DIAGNOSIS — I428 Other cardiomyopathies: Secondary | ICD-10-CM

## 2019-05-19 DIAGNOSIS — I472 Ventricular tachycardia, unspecified: Secondary | ICD-10-CM

## 2019-05-19 MED ORDER — CARVEDILOL 12.5 MG PO TABS: 25.0000 mg | ORAL_TABLET | Freq: Two times a day (BID) | ORAL | 3 refills | Status: DC

## 2019-05-19 NOTE — Telephone Encounter (Signed)
Alert received for treated VT episode on 2/25 1835. Episode appears monomorphic VT, ATP delivered with late break.  Pt had multiple NS VT episodes leading up and after episode.    Current meds include Coreg 12.5mg  BID, Entresto 49-51mg  BID, Inspra 25mg  daily, Mag Ox 400mg  BID, Mexiletine 200mg  BID.    Spoke with pt, he reports at time of episode he was moving [place].  He had to stop due to feeling in chest/ overall not feeling well.  Pt states he was quite aware of what was going on as it was similar to what felt during previous episodes.  Pt confirmed that he has not missed any doses of medications.  Pt indicated concern that this keeps happening especially when he is working.    Advised would forward to MD for review recommendations.

## 2019-05-19 NOTE — Telephone Encounter (Signed)
After reviewing with Dr. Graciela Husbands, pt to increase Coreg to 25mg  BID.  Have labwork completed for Potassium Magnesium levels.  Advised pt of recommendation for no driving until further evaluated by MD.

## 2019-05-20 LAB — BASIC METABOLIC PANEL
BUN/Creatinine Ratio: 12 (ref 9–20)
BUN: 14 mg/dL (ref 6–24)
CO2: 23 mmol/L (ref 20–29)
Calcium: 9.1 mg/dL (ref 8.7–10.2)
Chloride: 102 mmol/L (ref 96–106)
Creatinine, Ser: 1.16 mg/dL (ref 0.76–1.27)
GFR calc Af Amer: 79 mL/min/{1.73_m2} (ref >59)
GFR calc non Af Amer: 69 mL/min/{1.73_m2} (ref >59)
Glucose: 108 mg/dL — ABNORMAL HIGH (ref 65–99)
Potassium: 4.6 mmol/L (ref 3.5–5.2)
Sodium: 139 mmol/L (ref 134–144)

## 2019-05-20 LAB — MAGNESIUM: Magnesium: 2 mg/dL (ref 1.6–2.3)

## 2019-05-25 DIAGNOSIS — J029 Acute pharyngitis, unspecified: Secondary | ICD-10-CM | POA: Diagnosis not present

## 2019-05-25 DIAGNOSIS — Z20828 Contact with and (suspected) exposure to other viral communicable diseases: Secondary | ICD-10-CM | POA: Diagnosis not present

## 2019-05-25 DIAGNOSIS — R05 Cough: Secondary | ICD-10-CM | POA: Diagnosis not present

## 2019-05-25 DIAGNOSIS — J209 Acute bronchitis, unspecified: Secondary | ICD-10-CM | POA: Diagnosis not present

## 2019-07-07 ENCOUNTER — Ambulatory Visit (INDEPENDENT_AMBULATORY_CARE_PROVIDER_SITE_OTHER): Payer: BLUE CROSS/BLUE SHIELD | Admitting: Legal Medicine

## 2019-07-07 ENCOUNTER — Other Ambulatory Visit: Payer: Self-pay

## 2019-07-07 ENCOUNTER — Encounter: Payer: Self-pay | Admitting: Legal Medicine

## 2019-07-07 VITALS — BP 140/88 | HR 67 | Temp 97.3°F | Resp 16 | Ht 68.0 in | Wt 205.4 lb

## 2019-07-07 DIAGNOSIS — I10 Essential (primary) hypertension: Secondary | ICD-10-CM

## 2019-07-07 DIAGNOSIS — I5022 Chronic systolic (congestive) heart failure: Secondary | ICD-10-CM | POA: Diagnosis not present

## 2019-07-07 DIAGNOSIS — E782 Mixed hyperlipidemia: Secondary | ICD-10-CM

## 2019-07-07 DIAGNOSIS — E1165 Type 2 diabetes mellitus with hyperglycemia: Secondary | ICD-10-CM

## 2019-07-07 DIAGNOSIS — N1831 Chronic kidney disease, stage 3a: Secondary | ICD-10-CM

## 2019-07-07 NOTE — Assessment & Plan Note (Signed)

## 2019-07-07 NOTE — Assessment & Plan Note (Signed)
An individualized care plan was established and reinforced.  The patient's disease status was assessed using clinical finding son exam today, labs, and/or other diagnostic testing such as x-rays, to determine the patient's success in meeting treatmentgoalsbased on disease-based guidelines and found to bestable. But not at goal yet. Medications prescriptions no changes Laboratory tests ordered to be performed today include BNP. RECOMMENDATIONS: given include continue to see cardiology.  Call physician is patient gains 3 lbs in one day or 5 lbs for one week.  Call for progressive PND, orthopnea or increased pedal edema.

## 2019-07-07 NOTE — Progress Notes (Signed)
Acute Office Visit  Subjective:    Patient ID: Brian Noble, male    DOB: 1960-03-23, 59 y.o.   MRN: [id number]  Chief Complaint  Patient presents with  . Leg Pain    HPI Patient is in today for leg pain.  Both legs weak.  He has not fallen.  This has gone on for 6 months.  He has no energy, last EF 30 %.  He has 55% O2 max, he has defibrillor that has gone off several time. His general weakness and exercise intolerance is probably due to cardiac issues.  We will check labs also.  Past Medical History:  Diagnosis Date  . Arthritis   . Chronic kidney disease   . Chronic systolic CHF (congestive heart failure) ([facility])    a. Echo 8/16:  EF 10%, diff HK, Gr 1 DD, mild dilated aortic root, mild MR, mild LAE, severely reduced RVSF  . Hypertension   . ICD (implantable cardioverter-defibrillator) in place   . NICM (nonischemic cardiomyopathy) ([facility])    a. LHC 8/16:  normal coronary arteries.   . VF (ventricular fibrillation) ([facility])    s/p OOH cardiac arrest 8/16 >> s/p ICD    Past Surgical History:  Procedure Laterality Date  . APPENDECTOMY    . CARDIAC CATHETERIZATION N/A 11/10/2014   Procedure: Left Heart Cath and Coronary Angiography;  Surgeon: [name], MD;  Location: [facility];  Service: Cardiovascular;  Laterality: N/A;  . EP IMPLANTABLE DEVICE N/A 11/16/2014   Procedure: ICD Implant;  Surgeon: [name], MD;  Location: [facility];  Service: Cardiovascular;  Laterality: N/A;  . RIGHT HEART CATH AND CORONARY ANGIOGRAPHY N/A 01/30/2018   Procedure: RIGHT HEART CATH AND CORONARY ANGIOGRAPHY;  Surgeon: Laurey Morale, MD;  Location: [facility];  Service: Cardiovascular;  Laterality: N/A;  . ULTRASOUND GUIDANCE FOR VASCULAR ACCESS  01/30/2018   Procedure: Ultrasound Guidance For Vascular Access;  Surgeon: Laurey Morale, MD;  Location: [facility] INVASIVE [facility] LAB;  Service: Cardiovascular;;    Family History  Problem Relation Age of Onset  .  Hypertension Father   . Sudden death Father   . Hypertension Mother   . Heart disease Sister   . Heart disease Sister   . Hypertension Sister   . Hypertension Sister   . Hypertension Brother   . Hypertension Brother   . Hypertension Brother   . Hypertension Brother   . Hypertension Sister     Social History   Socioeconomic History  . Marital status: Married    Spouse name: Not on file  . Number of children: 2  . Years of education: 2  . Highest education level: 12th grade  Occupational History  . Not on file  Tobacco Use  . Smoking status: Never Smoker  . Smokeless tobacco: Never Used  Substance and Sexual Activity  . Alcohol use: No  . Drug use: No  . Sexual activity: Not on file  Other Topics Concern  . Not on file  Social History Narrative  . Not on file   Social Determinants of Health   Financial Resource Strain:   . Difficulty of Paying Living Expenses:   Food Insecurity:   . Worried About Programme researcher, broadcasting/film/video in the Last Year:   . Barista in the Last Year:   Transportation Needs:   . Freight forwarder (Medical):   Marland Kitchen Lack of Transportation (Non-Medical):   Physical Activity:   .  Days of Exercise per Week:   . Minutes of Exercise per Session:   Stress:   . Feeling of Stress :   Social Connections:   . Frequency of Communication with Friends and Family:   . Frequency of Social Gatherings with Friends and Family:   . Attends Religious Services:   . Active Member of Clubs or Organizations:   . Attends Banker Meetings:   Marland Kitchen Marital Status:   Intimate Partner Violence:   . Fear of Current or Ex-Partner:   . Emotionally Abused:   Marland Kitchen Physically Abused:   . Sexually Abused:     Outpatient Medications Prior to Visit  Medication Sig Dispense Refill  . aspirin EC 81 MG tablet Take 81 mg by mouth daily.    . carvedilol (COREG) 12.5 MG tablet Take 2 tablets (25 mg total) by mouth 2 (two) times daily. 360 tablet 3  . colchicine 0.6  MG tablet TAKE ONE TABLET BY MOUTH ONCE DAILY AS NEEDED FOR FLARE UPS 15 tablet 0  . ENTRESTO 49-51 MG TAKE 1 TABLET BY MOUTH TWICE A DAY 180 tablet 3  . eplerenone (INSPRA) 25 MG tablet TAKE 1 TABLET BY MOUTH EVERY DAY 30 tablet 11  . furosemide (LASIX) 40 MG tablet TAKE 1 TABLET BY MOUTH EVERY OTHER DAY 45 tablet 2  . lovastatin (MEVACOR) 20 MG tablet TAKE 1 TABLET BY MOUTH EVERY DAY 30 tablet 6  . magnesium oxide (MAG-OX) 400 MG tablet Take 400 mg by mouth 2 (two) times a day.     . mexiletine (MEXITIL) 200 MG capsule Take 1 capsule (200 mg total) by mouth 2 (two) times daily. 60 capsule 11  . sildenafil (REVATIO) 20 MG tablet TAKE ONE-HALF (1/2) TABLET BY MOUTH ONCEDAILY AS NEEDED FOR ERECTILE DYSFUNCTION 10 tablet 1   No facility-administered medications prior to visit.    No Known Allergies  Review of Systems  Constitutional: Negative.   HENT: Negative.   Eyes: Negative.   Respiratory: Negative.   Cardiovascular: Negative.   Gastrointestinal: Negative.   Endocrine: Negative.   Genitourinary: Negative.   Musculoskeletal: Positive for arthralgias and myalgias.  Psychiatric/Behavioral: Negative.        Objective:    Physical Exam Vitals reviewed.  Constitutional:      Appearance: Normal appearance.  HENT:     Head: Normocephalic and atraumatic.     Right Ear: Tympanic membrane normal.     Left Ear: Tympanic membrane normal.     Nose: Nose normal.     Mouth/Throat:     Mouth: Mucous membranes are dry.  Eyes:     Extraocular Movements: Extraocular movements intact.     Pupils: Pupils are equal, round, and reactive to light.  Cardiovascular:     Rate and Rhythm: Normal rate and regular rhythm.     Pulses: Normal pulses.     Heart sounds: Normal heart sounds.     Comments: Slow capillary filling in feet Pulmonary:     Effort: Pulmonary effort is normal.     Breath sounds: Normal breath sounds.  Musculoskeletal:        General: Normal range of motion.  Skin:     Capillary Refill: Capillary refill takes more than 3 seconds.  Neurological:     Mental Status: He is alert.     BP 140/88   Pulse 67   Temp (!) 97.3 F (36.3 C)   Resp 16   Ht 5\' 8"  (1.727 m)   Wt 205  lb 6.4 oz (93.2 kg)   SpO2 94%   BMI 31.23 kg/m  Wt Readings from Last 3 Encounters:  07/07/19 205 lb 6.4 oz (93.2 kg)  02/17/19 209 lb 3.2 oz (94.9 kg)  10/23/18 210 lb (95.3 kg)    Health Maintenance Due  Topic Date Due  . Hepatitis C Screening  Never done  . COVID-19 Vaccine (1) Never done  . TETANUS/TDAP  Never done  . COLONOSCOPY  Never done    There are no preventive care reminders to display for this patient.   Lab Results  Component Value Date   TSH 2.306 12/20/2017   Lab Results  Component Value Date   WBC 7.4 10/20/2018   HGB 15.2 10/20/2018   HCT 47.5 10/20/2018   MCV 93.3 10/20/2018   PLT 212 10/20/2018   Lab Results  Component Value Date   NA 139 05/19/2019   K 4.6 05/19/2019   CO2 23 05/19/2019   GLUCOSE 108 (H) 05/19/2019   BUN 14 05/19/2019   CREATININE 1.16 05/19/2019   BILITOT 1.2 12/19/2017   ALKPHOS 42 12/19/2017   AST 35 12/19/2017   ALT 39 12/19/2017   PROT 7.3 12/19/2017   ALBUMIN 4.4 12/19/2017   CALCIUM 9.1 05/19/2019   ANIONGAP 8 10/20/2018   GFR 78.91 11/24/2014   No results found for: CHOL No results found for: HDL No results found for: [facility] Lab Results  Component Value Date   TRIG 165 (H) 11/12/2014   No results found for: CHOLHDL No results found for: HGBA1C     Assessment & Plan:   Problem List Items Addressed This Visit      Cardiovascular and Mediastinum   Chronic systolic CHF (congestive heart failure) ([place]) - Primary    An individualized care plan was established and reinforced.  The patient's disease status was assessed using clinical finding son exam today, labs, and/or other diagnostic testing such as x-rays, to determine the patient's success in meeting treatmentgoalsbased on disease-based  guidelines and found to bestable. But not at goal yet. Medications prescriptions no changes Laboratory tests ordered to be performed today include BNP. RECOMMENDATIONS: given include continue to see cardiology.  Call physician is patient gains 3 lbs in one day or 5 lbs for one week.  Call for progressive PND, orthopnea or increased pedal edema.      Relevant Orders   CBC with Differential   Comprehensive metabolic panel   TSH   Brain natriuretic peptide   Essential hypertension, benign    An individual hypertension care plan was established and reinforced today.  The patient's status was assessed using clinical findings on exam and labs or diagnostic tests. The patient's success at meeting treatment goals on disease specific evidence-based guidelines and found to be well controlled. SELF MANAGEMENT: The patient and I together assessed ways to personally work towards obtaining the recommended goals. RECOMMENDATIONS: avoid decongestants found in common cold remedies, decrease consumption of alcohol, perform routine monitoring of BP with home BP cuff, exercise, reduction of dietary salt, take medicines as prescribed, try not to miss doses and quit smoking.  Regular exercise and maintaining a healthy weight is needed.  Stress reduction may help. A CLINICAL SUMMARY including written plan identify barriers to care unique to individual due to social or financial issues.  We attempt to mutually creat solutions for individual and family understanding.        Genitourinary   Stage 3a chronic kidney disease    AN INDIVIDUAL CARE PLAN was  established and reinforced today.  The patient's status was assessed using clinical findings on exam, labs, and other diagnostic testing. Patient's success at meeting treatment goals based on disease specific evidence-bassed guidelines and found to be in fair control. RECOMMENDATIONS include maintaining present medicines and treatment.        Other   Mixed  hyperlipidemia    AN INDIVIDUAL CARE PLAN for hyperlipidemia/ cholesterol was established and reinforced today.  The patient's status was assessed using clinical findings on exam, lab and other diagnostic tests. The patient's disease status was assessed based on evidence-based guidelines and found to be good controlled. MEDICATIONS were reviewed. SELF MANAGEMENT GOALS have been discussed and patient's success at attaining the goal of low cholesterol was assessed. RECOMMENDATION given include regular exercise 3 days a week and low cholesterol/low fat diet. CLINICAL SUMMARY including written plan to identify barriers unique to the patient due to social or economic  reasons was discussed.      Relevant Orders   Lipid Panel    Other Visit Diagnoses    Type 2 diabetes mellitus with hyperglycemia, without long-term current use of insulin (HCC)       Relevant Orders   Hemoglobin A1c     Return in about 1 month (around 08/06/2019).  No orders of the defined types were placed in this encounter.    Brent Bulla, MD

## 2019-07-07 NOTE — Assessment & Plan Note (Signed)
AN INDIVIDUAL CARE PLAN for hyperlipidemia/ cholesterol was established and reinforced today.  The patient's status was assessed using clinical findings on exam, lab and other diagnostic tests. The patient's disease status was assessed based on evidence-based guidelines and found to be good controlled. MEDICATIONS were reviewed. SELF MANAGEMENT GOALS have been discussed and patient's success at attaining the goal of low cholesterol was assessed. RECOMMENDATION given include regular exercise 3 days a week and low cholesterol/low fat diet. CLINICAL SUMMARY including written plan to identify barriers unique to the patient due to social or economic  reasons was discussed. 

## 2019-07-07 NOTE — Assessment & Plan Note (Signed)
AN INDIVIDUAL CARE PLAN was established and reinforced today.  The patient's status was assessed using clinical findings on exam, labs, and other diagnostic testing. Patient's success at meeting treatment goals based on disease specific evidence-bassed guidelines and found to be in fair control. RECOMMENDATIONS include maintaining present medicines and treatment. 

## 2019-07-08 LAB — LIPID PANEL
Chol/HDL Ratio: 3.6 ratio (ref 0.0–5.0)
Cholesterol, Total: 153 mg/dL (ref 100–199)
HDL: 43 mg/dL (ref >39)
LDL Chol Calc (NIH): 71 mg/dL (ref 0–99)
Triglycerides: 240 mg/dL — ABNORMAL HIGH (ref 0–149)
VLDL Cholesterol Cal: 39 mg/dL (ref 5–40)

## 2019-07-08 LAB — CBC WITH DIFFERENTIAL/PLATELET
Basophils Absolute: 0.1 10*3/uL (ref 0.0–0.2)
Basos: 2 %
EOS (ABSOLUTE): 0.3 10*3/uL (ref 0.0–0.4)
Eos: 4 %
Hematocrit: 46.7 % (ref 37.5–51.0)
Hemoglobin: 15.2 g/dL (ref 13.0–17.7)
Immature Grans (Abs): 0 10*3/uL (ref 0.0–0.1)
Immature Granulocytes: 0 %
Lymphocytes Absolute: 1.8 10*3/uL (ref 0.7–3.1)
Lymphs: 22 %
MCH: 29.6 pg (ref 26.6–33.0)
MCHC: 32.5 g/dL (ref 31.5–35.7)
MCV: 91 fL (ref 79–97)
Monocytes Absolute: 1 10*3/uL — ABNORMAL HIGH (ref 0.1–0.9)
Monocytes: 12 %
Neutrophils Absolute: 4.9 10*3/uL (ref 1.4–7.0)
Neutrophils: 60 %
Platelets: 239 10*3/uL (ref 150–450)
RBC: 5.14 x10E6/uL (ref 4.14–5.80)
RDW: 12.4 % (ref 11.6–15.4)
WBC: 8.3 10*3/uL (ref 3.4–10.8)

## 2019-07-08 LAB — HEMOGLOBIN A1C
Est. average glucose Bld gHb Est-mCnc: 94 mg/dL
Hgb A1c MFr Bld: 4.9 % (ref 4.8–5.6)

## 2019-07-08 LAB — COMPREHENSIVE METABOLIC PANEL
ALT: 29 IU/L (ref 0–44)
AST: 25 IU/L (ref 0–40)
Albumin/Globulin Ratio: 2.1 (ref 1.2–2.2)
Albumin: 4.7 g/dL (ref 3.8–4.9)
Alkaline Phosphatase: 57 IU/L (ref 39–117)
BUN/Creatinine Ratio: 13 (ref 9–20)
BUN: 16 mg/dL (ref 6–24)
Bilirubin Total: 0.7 mg/dL (ref 0.0–1.2)
CO2: 22 mmol/L (ref 20–29)
Calcium: 10 mg/dL (ref 8.7–10.2)
Chloride: 102 mmol/L (ref 96–106)
Creatinine, Ser: 1.23 mg/dL (ref 0.76–1.27)
GFR calc Af Amer: 74 mL/min/{1.73_m2} (ref >59)
GFR calc non Af Amer: 64 mL/min/{1.73_m2} (ref >59)
Globulin, Total: 2.2 g/dL (ref 1.5–4.5)
Glucose: 98 mg/dL (ref 65–99)
Potassium: 5.4 mmol/L — ABNORMAL HIGH (ref 3.5–5.2)
Sodium: 138 mmol/L (ref 134–144)
Total Protein: 6.9 g/dL (ref 6.0–8.5)

## 2019-07-08 LAB — BRAIN NATRIURETIC PEPTIDE: BNP: 132.1 pg/mL — ABNORMAL HIGH (ref 0.0–100.0)

## 2019-07-08 LAB — TSH: TSH: 1.76 u[IU]/mL (ref 0.450–4.500)

## 2019-07-08 LAB — CARDIOVASCULAR RISK ASSESSMENT

## 2019-07-08 NOTE — Progress Notes (Signed)
CBC normal, Potassium 5.4 high, recheck one week.  Kidney tests normal, liver normal, TAS normal 1.76, triglycerides are high- watch diet., BNP 132 mild failure.  This can explain fatigue, A1c 4.9 normal lp

## 2019-07-14 ENCOUNTER — Other Ambulatory Visit: Payer: Self-pay

## 2019-07-14 ENCOUNTER — Ambulatory Visit: Payer: BLUE CROSS/BLUE SHIELD

## 2019-07-14 DIAGNOSIS — E875 Hyperkalemia: Secondary | ICD-10-CM | POA: Diagnosis not present

## 2019-07-15 LAB — COMPREHENSIVE METABOLIC PANEL
ALT: 33 IU/L (ref 0–44)
AST: 27 IU/L (ref 0–40)
Albumin/Globulin Ratio: 1.7 (ref 1.2–2.2)
Albumin: 4.4 g/dL (ref 3.8–4.9)
Alkaline Phosphatase: 52 IU/L (ref 39–117)
BUN/Creatinine Ratio: 12 (ref 9–20)
BUN: 14 mg/dL (ref 6–24)
Bilirubin Total: 0.8 mg/dL (ref 0.0–1.2)
CO2: 21 mmol/L (ref 20–29)
Calcium: 9.6 mg/dL (ref 8.7–10.2)
Chloride: 105 mmol/L (ref 96–106)
Creatinine, Ser: 1.19 mg/dL (ref 0.76–1.27)
GFR calc Af Amer: 77 mL/min/{1.73_m2} (ref >59)
GFR calc non Af Amer: 66 mL/min/{1.73_m2} (ref >59)
Globulin, Total: 2.6 g/dL (ref 1.5–4.5)
Glucose: 124 mg/dL — ABNORMAL HIGH (ref 65–99)
Potassium: 4.9 mmol/L (ref 3.5–5.2)
Sodium: 140 mmol/L (ref 134–144)
Total Protein: 7 g/dL (ref 6.0–8.5)

## 2019-07-15 NOTE — Progress Notes (Signed)
Glucose 124, potassium 4.9 normal lp

## 2019-08-04 ENCOUNTER — Other Ambulatory Visit: Payer: Self-pay

## 2019-08-04 ENCOUNTER — Ambulatory Visit (INDEPENDENT_AMBULATORY_CARE_PROVIDER_SITE_OTHER): Payer: BLUE CROSS/BLUE SHIELD | Admitting: Legal Medicine

## 2019-08-04 ENCOUNTER — Encounter: Payer: Self-pay | Admitting: Legal Medicine

## 2019-08-04 ENCOUNTER — Other Ambulatory Visit (HOSPITAL_COMMUNITY): Payer: Self-pay | Admitting: Cardiology

## 2019-08-04 VITALS — BP 126/88 | HR 56 | Temp 97.0°F | Resp 16 | Ht 68.0 in | Wt 206.0 lb

## 2019-08-04 DIAGNOSIS — I5022 Chronic systolic (congestive) heart failure: Secondary | ICD-10-CM

## 2019-08-04 NOTE — Progress Notes (Signed)
Established Patient Office Visit  Subjective:  Patient ID: Brian Noble, male    DOB: 03/18/1961  Age: 59 y.o. MRN: [id number]  CC:  Chief Complaint  Patient presents with  . Congestive Heart Failure    HPI Brian Noble presents for CHF last BNP was 132.  H is having less symptoms and less dyspnea.  Patient presents with HFreF  that is stable. Diagnosis made 2015.  The course of the disease is stable.  Current medicines include mexilitine, entresto, carvidilol, furosemide. Patient follows a low cholesterol diet and maintains a weight diary.  Patient is on low salt, low cholesterol diet and avoids alcohol.  Patient denies adverse effects of medicines. Patient is monitoring weight and has no change of weight.  Patient is having no pedal edema, no orthopnea and no PND.  Patient is continuing to see cardiology.  Past Medical History:  Diagnosis Date  . Arthritis   . Chronic kidney disease   . Chronic systolic CHF (congestive heart failure) ([place])    a. Echo 8/16:  EF 10%, diff HK, Gr 1 DD, mild dilated aortic root, mild MR, mild LAE, severely reduced RVSF  . Hypertension   . ICD (implantable cardioverter-defibrillator) in place   . NICM (nonischemic cardiomyopathy) ([place])    a. LHC 8/16:  normal coronary arteries.   . VF (ventricular fibrillation) ([place])    s/p OOH cardiac arrest 8/16 >> s/p ICD    Past Surgical History:  Procedure Laterality Date  . APPENDECTOMY    . CARDIAC CATHETERIZATION N/A 11/10/2014   Procedure: Left Heart Cath and Coronary Angiography;  Surgeon: Lorretta Harp, MD;  Location: [facility];  Service: Cardiovascular;  Laterality: N/A;  . EP IMPLANTABLE DEVICE N/A 11/16/2014   Procedure: ICD Implant;  Surgeon: Deboraha Sprang, MD;  Location: [facility];  Service: Cardiovascular;  Laterality: N/A;  . RIGHT HEART CATH AND CORONARY ANGIOGRAPHY N/A 01/30/2018   Procedure: RIGHT HEART CATH AND CORONARY ANGIOGRAPHY;  Surgeon: Larey Dresser, MD;   Location: [facility];  Service: Cardiovascular;  Laterality: N/A;  . ULTRASOUND GUIDANCE FOR VASCULAR ACCESS  01/30/2018   Procedure: Ultrasound Guidance For Vascular Access;  Surgeon: Larey Dresser, MD;  Location: [facility];  Service: Cardiovascular;;    Family History  Problem Relation Age of Onset  . Hypertension Father   . Sudden death Father   . Hypertension Mother   . Heart disease Sister   . Heart disease Sister   . Hypertension Sister   . Hypertension Sister   . Hypertension Brother   . Hypertension Brother   . Hypertension Brother   . Hypertension Brother   . Hypertension Sister     Social History   Socioeconomic History  . Marital status: Married    Spouse name: Not on file  . Number of children: 2  . Years of education: 42  . Highest education level: 12th grade  Occupational History  . Not on file  Tobacco Use  . Smoking status: Never Smoker  . Smokeless tobacco: Former Systems developer    Types: Chew  Substance and Sexual Activity  . Alcohol use: No  . Drug use: No  . Sexual activity: Yes  Other Topics Concern  . Not on file  Social History Narrative  . Not on file   Social Determinants of Health   Financial Resource Strain:   . Difficulty of Paying Living Expenses:   Food Insecurity:   .  Worried About Programme researcher, broadcasting/film/video in the Last Year:   . Barista in the Last Year:   Transportation Needs:   . Freight forwarder (Medical):   Marland Kitchen Lack of Transportation (Non-Medical):   Physical Activity:   . Days of Exercise per Week:   . Minutes of Exercise per Session:   Stress:   . Feeling of Stress :   Social Connections:   . Frequency of Communication with Friends and Family:   . Frequency of Social Gatherings with Friends and Family:   . Attends Religious Services:   . Active Member of Clubs or Organizations:   . Attends Banker Meetings:   Marland Kitchen Marital Status:   Intimate Partner Violence:   . Fear of Current or  Ex-Partner:   . Emotionally Abused:   Marland Kitchen Physically Abused:   . Sexually Abused:     Outpatient Medications Prior to Visit  Medication Sig Dispense Refill  . aspirin EC 81 MG tablet Take 81 mg by mouth daily.    . carvedilol (COREG) 12.5 MG tablet Take 2 tablets (25 mg total) by mouth 2 (two) times daily. 360 tablet 3  . colchicine 0.6 MG tablet TAKE ONE TABLET BY MOUTH ONCE DAILY AS NEEDED FOR FLARE UPS 15 tablet 0  . eplerenone (INSPRA) 25 MG tablet TAKE 1 TABLET BY MOUTH EVERY DAY 30 tablet 11  . furosemide (LASIX) 40 MG tablet TAKE 1 TABLET BY MOUTH EVERY OTHER DAY 45 tablet 2  . lovastatin (MEVACOR) 20 MG tablet TAKE 1 TABLET BY MOUTH EVERY DAY 30 tablet 6  . magnesium oxide (MAG-OX) 400 MG tablet Take 400 mg by mouth 2 (two) times a day.     . mexiletine (MEXITIL) 200 MG capsule Take 1 capsule (200 mg total) by mouth 2 (two) times daily. 60 capsule 11  . sildenafil (REVATIO) 20 MG tablet TAKE ONE-HALF (1/2) TABLET BY MOUTH ONCEDAILY AS NEEDED FOR ERECTILE DYSFUNCTION 10 tablet 1  . ENTRESTO 49-51 MG TAKE 1 TABLET BY MOUTH TWICE A DAY 180 tablet 3   No facility-administered medications prior to visit.    No Known Allergies  ROS Review of Systems  Constitutional: Negative.   HENT: Negative.   Eyes: Negative.   Respiratory: Negative.   Cardiovascular: Negative.   Gastrointestinal: Negative.   Endocrine: Negative.   Genitourinary: Negative.   Musculoskeletal: Negative.   Skin: Negative.   Allergic/Immunologic: Negative.   Neurological: Negative.   Hematological: Negative.       Objective:    Physical Exam  Constitutional: He appears well-developed and well-nourished.  HENT:  Head: Normocephalic and atraumatic.  Eyes: Conjunctivae and EOM are normal.  Cardiovascular: Normal rate, regular rhythm, normal heart sounds and intact distal pulses.  Pulmonary/Chest: Effort normal and breath sounds normal.  Musculoskeletal:        General: Normal range of motion.      Cervical back: Normal range of motion and neck supple.    BP 126/88 (BP Location: Right Arm, Patient Position: Sitting)   Pulse (!) 56   Temp (!) 97 F (36.1 C) (Temporal)   Resp 16   Ht 5\' 8"  (1.727 m)   Wt 206 lb (93.4 kg)   SpO2 98%   BMI 31.32 kg/m  Wt Readings from Last 3 Encounters:  08/04/19 206 lb (93.4 kg)  07/07/19 205 lb 6.4 oz (93.2 kg)  02/17/19 209 lb 3.2 oz (94.9 kg)     Health Maintenance Due  Topic Date  Due  . Hepatitis C Screening  Never done  . COVID-19 Vaccine (1) Never done  . TETANUS/TDAP  Never done  . COLONOSCOPY  Never done    There are no preventive care reminders to display for this patient.  Lab Results  Component Value Date   TSH 1.760 07/07/2019   Lab Results  Component Value Date   WBC 8.3 07/07/2019   HGB 15.2 07/07/2019   HCT 46.7 07/07/2019   MCV 91 07/07/2019   PLT 239 07/07/2019   Lab Results  Component Value Date   NA 140 07/14/2019   K 4.9 07/14/2019   CO2 21 07/14/2019   GLUCOSE 124 (H) 07/14/2019   BUN 14 07/14/2019   CREATININE 1.19 07/14/2019   BILITOT 0.8 07/14/2019   ALKPHOS 52 07/14/2019   AST 27 07/14/2019   ALT 33 07/14/2019   PROT 7.0 07/14/2019   ALBUMIN 4.4 07/14/2019   CALCIUM 9.6 07/14/2019   ANIONGAP 8 10/20/2018   GFR 78.91 11/24/2014   Lab Results  Component Value Date   CHOL 153 07/07/2019   Lab Results  Component Value Date   HDL 43 07/07/2019   Lab Results  Component Value Date   LDLCALC 71 07/07/2019   Lab Results  Component Value Date   TRIG 240 (H) 07/07/2019   Lab Results  Component Value Date   CHOLHDL 3.6 07/07/2019   Lab Results  Component Value Date   HGBA1C 4.9 07/07/2019      Assessment & Plan:   Problem List Items Addressed This Visit      Cardiovascular and Mediastinum   Chronic systolic CHF (congestive heart failure) (HCC) - Primary    Patient has improved.  And is feeling better and now working full time.  Follow up at chronic visit.  He is stable now.           No orders of the defined types were placed in this encounter.   Follow-up: Return in about 3 months (around 11/04/2019) for fasting.    Brent Bulla, MD

## 2019-08-04 NOTE — Assessment & Plan Note (Signed)
Patient has improved.  And is feeling better and now working full time.  Follow up at chronic visit.  He is stable now.

## 2019-08-08 DIAGNOSIS — J3489 Other specified disorders of nose and nasal sinuses: Secondary | ICD-10-CM | POA: Diagnosis not present

## 2019-08-08 DIAGNOSIS — Z20828 Contact with and (suspected) exposure to other viral communicable diseases: Secondary | ICD-10-CM | POA: Diagnosis not present

## 2019-08-13 ENCOUNTER — Ambulatory Visit (INDEPENDENT_AMBULATORY_CARE_PROVIDER_SITE_OTHER): Payer: BLUE CROSS/BLUE SHIELD | Admitting: *Deleted

## 2019-08-13 DIAGNOSIS — I469 Cardiac arrest, cause unspecified: Secondary | ICD-10-CM | POA: Diagnosis not present

## 2019-08-13 DIAGNOSIS — I472 Ventricular tachycardia, unspecified: Secondary | ICD-10-CM

## 2019-08-14 LAB — CUP PACEART REMOTE DEVICE CHECK
Battery Remaining Longevity: 85 mo
Battery Voltage: 2.99 V
Brady Statistic RV Percent Paced: 0.02 %
Date Time Interrogation Session: 20210526202602
HighPow Impedance: 78 Ohm
Implantable Lead Implant Date: 20160829
Implantable Lead Location: 753860
Implantable Pulse Generator Implant Date: 20160829
Lead Channel Impedance Value: 399 Ohm
Lead Channel Impedance Value: 494 Ohm
Lead Channel Pacing Threshold Amplitude: 0.75 V
Lead Channel Pacing Threshold Pulse Width: 0.4 ms
Lead Channel Sensing Intrinsic Amplitude: 23.375 mV
Lead Channel Sensing Intrinsic Amplitude: 23.375 mV
Lead Channel Setting Pacing Amplitude: 2 V
Lead Channel Setting Pacing Pulse Width: 0.4 ms
Lead Channel Setting Sensing Sensitivity: 0.3 mV

## 2019-08-14 NOTE — Progress Notes (Signed)
Remote ICD transmission.   

## 2019-08-18 NOTE — Progress Notes (Signed)
Date:  08/19/2019   ID:  Brian Noble, DOB 24-Oct-1960, MRN [id number]  Location: Home  Provider location: [facility] Type of Visit: Established patient   PCP:  [name], MD  Cardiologist:  No primary care provider on file. Primary HF: Dr Shirlee Latch EP: Dr Graciela Husbands   Chief Complaint: Heart Failure    History of Present Illness: Brian Noble is a 59 y.o. male with a history of cardiomyopathy who developed a cardiac arrest and was found to have low EF and normal coronaries.  Per patient, he was found to have low EF around 20% about 8 years ago.  He was seen by a cardiologist in [place] (he thinks), and EF went back to normal range.  He had no problems up until 8/16.  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  He was noted to pass out by family.  EMS was called and arrived quickly.  He was in either ventricular fibrillation or VT and was defibrillated.  He was cooled and sent for coronary angiography, which showed no significant coronary disease.  Echo showed EF 10% with severe RV dysfunction.  After recovery, he got a Medtronic ICD.  Repeat echo in 11/16 showed EF 20% with septal-lateral dyssynchrony, moderately decreased RV systolic function.  Unable to take Bidil due to severe headaches.    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 Appropriate shock due to VT. ECHO completed and showed EF 20-25% and normal RV. Carvedilol increased to 18.75 mg twice a day. He was discharged on 12/20/17. He had follow up with [name] EP NP and carvedilol was increased to 25 mg twice a day.   RHC/LHC was done in 11/19, showing no significant CAD and optimized filling pressures with preserved CI 2.31. CPX in 11/19 showed deconditioning but no significant cardiac limitation.    Today he returns for HF follow up.Overall feeling fine. Denies SOB/PND/Orthopnea. Riding a bike about 1-2 miles most days. Appetite ok. No fever or chills. Weight at home  197 pounds. SBP at home < 140.  Taking all medications. Intolerant carvedilol 25 mg twice a day due to fatigue. Continues to work full time.   Past Medical History:  Diagnosis Date  . Arthritis   . Chronic kidney disease   . Chronic systolic CHF (congestive heart failure) (HCC)    a. Echo 8/16:  EF 10%, diff HK, Gr 1 DD, mild dilated aortic root, mild MR, mild LAE, severely reduced RVSF  . Hypertension   . ICD (implantable cardioverter-defibrillator) in place   . NICM (nonischemic cardiomyopathy) (H[facility])    a. LHC 8/16:  normal coronary arteries.   . VF (ventricular fibrillation) (HCC)    s/p OOH cardiac arrest 8/16 >> s/p ICD   Past Surgical History:  Procedure Laterality Date  . APPENDECTOMY    . CARDIAC CATHETERIZATION N/A 11/10/2014   Procedure: Left Heart Cath and Coronary Angiography;  Surgeon: [name], MD;  Location: [facility];  Service: Cardiovascular;  Laterality: N/A;  . EP IMPLANTABLE DEVICE N/A 11/16/2014   Procedure: ICD Implant;  Surgeon: [name], MD;  Location: [facility];  Service: Cardiovascular;  Laterality: N/A;  . RIGHT HEART CATH AND CORONARY ANGIOGRAPHY N/A 01/30/2018   Procedure: RIGHT HEART CATH AND CORONARY ANGIOGRAPHY;  Surgeon: Laurey Morale, MD;  Location: [facility];  Service: Cardiovascular;  Laterality: N/A;  . ULTRASOUND GUIDANCE FOR VASCULAR ACCESS  01/30/2018   Procedure: Ultrasound Guidance For Vascular Access;  Surgeon: Laurey Morale, MD;  Location: [facility];  Service: Cardiovascular;;     Current Outpatient Medications  Medication Sig Dispense Refill  . aspirin EC 81 MG tablet Take 81 mg by mouth daily.    . carvedilol (COREG) 12.5 MG tablet Take 18.75 mg by mouth 2 (two) times daily with a meal.    . colchicine 0.6 MG tablet TAKE ONE TABLET BY MOUTH ONCE DAILY AS NEEDED FOR FLARE UPS 15 tablet 0  . eplerenone (INSPRA) 25 MG tablet TAKE 1 TABLET BY MOUTH EVERY DAY 30 tablet 11  . furosemide  (LASIX) 40 MG tablet TAKE 1 TABLET BY MOUTH EVERY OTHER DAY (Patient taking differently: 20 mg. ) 45 tablet 2  . lovastatin (MEVACOR) 20 MG tablet TAKE 1 TABLET BY MOUTH EVERY DAY 30 tablet 6  . magnesium oxide (MAG-OX) 400 MG tablet Take 400 mg by mouth 2 (two) times a day.     . mexiletine (MEXITIL) 200 MG capsule Take 1 capsule (200 mg total) by mouth 2 (two) times daily. 60 capsule 11  . sacubitril-valsartan (ENTRESTO) 49-51 MG Take 1 tablet by mouth 2 (two) times daily. Must keep pending appt for further refills 60 tablet 0  . sildenafil (REVATIO) 20 MG tablet TAKE ONE-HALF (1/2) TABLET BY MOUTH ONCEDAILY AS NEEDED FOR ERECTILE DYSFUNCTION 10 tablet 1   No current facility-administered medications for this encounter.    Allergies:   Patient has no known allergies.   Social History:  The patient  reports that he has never smoked. He quit smokeless tobacco use about 21 years ago.  His smokeless tobacco use included chew. He reports that he does not drink alcohol or use drugs.   Family History:  The patient's family history includes Heart disease in his sister and sister; Hypertension in his brother, brother, brother, brother, father, mother, sister, sister, and sister; Sudden death in his father.   ROS:  Please see the history of present illness.   All other systems are personally reviewed and negative.  Vitals:   08/19/19 0925  BP: (!) 142/96  Pulse: (!) 59  SpO2: 96%   Wt Readings from Last 3 Encounters:  08/19/19 91.6 kg (202 lb)  08/04/19 93.4 kg (206 lb)  07/07/19 93.2 kg (205 lb 6.4 oz)    General:  Well appearing. No resp difficulty HEENT: normal Neck: supple. no JVD. Carotids 2+ bilat; no bruits. No lymphadenopathy or thryomegaly appreciated. Cor: PMI nondisplaced. Regular rate & rhythm. No rubs, gallops or murmurs. Lungs: clear Abdomen: soft, nontender, nondistended. No hepatosplenomegaly. No bruits or masses. Good bowel sounds. Extremities: no cyanosis, clubbing, rash,  edema Neuro: alert & orientedx3, cranial nerves grossly intact. moves all 4 extremities w/o difficulty. Affect pleasant  Recent Labs: 05/19/2019: Magnesium 2.0 07/07/2019: BNP 132.1; Hemoglobin 15.2; Platelets 239; TSH 1.760 07/14/2019: ALT 33; BUN 14; Creatinine, Ser 1.19; Potassium 4.9; Sodium 140  Personally reviewed   Wt Readings from Last 3 Encounters:  08/19/19 91.6 kg (202 lb)  08/04/19 93.4 kg (206 lb)  07/07/19 93.2 kg (205 lb 6.4 oz)     ASSESSMENT AND PLAN:  1. Chronic systolic CHF: Nonischemic cardiomyopathy.  Medtronic ICD.  Possibly familial CMP given father with SCD at 106, sister with heart transplant, and 2 other sisters with "heart problems."  Cannot rule out prior viral myocarditis or role for HTN. Echo 12/17 with EF 30%, septal-lateral dyssynchrony.  CPX in 3/17 with mild  functional impairment. Echo in 10/19 with EF 20-25%.  CPX repeat 01/2018 with submaximal effort but minimal cardiac impairment, suggestive of deconditioning.  [place] in 11/19 showed optimized filling pressures with preserved cardiac index.   Medtronic Optivol- fluid index well below threshold. Activity ~ 6 hours, No VT, No A fib  NYHA II . Looks great!  - Continue Coreg back to 18.75 mg bid.  Intolerant 25 mg twice a day due to profound fatigue.  - Continue Entresto to 49/51 bid  - Continue lasix 20 mg every other day.  -Discussed farxiga. Consider at next  - Unable to tolerate Bidil due to headaches.  - Continue eplerenone 25 mg daily.  - Given possible familial CMP, will need to talk to Mr Nestle at next appointment about [facility] genetic testing.  - Patient has IVCD, 140 msec on ECG today.  Given that this is not a true LBBB and QRS < 150, probably not good CRT candidate.   2. VT: Has Medtronic ICD.  VT with syncope in 10/19.  No recent shocks.  Followed by EP.  No recent VT.   3 .Suspect sleep apnea: Daytime fatigue and sleepiness He is interested in a home sleep study. We will set up home sleep study  with Dr Radford Pax to read.  - I will follow up on results.    Follow up in 6 months with Dr [name] and an ECHO.    Jeanmarie Hubert, NP  08/19/2019 9:55 AM  [address] [place] [address] [phone] (office) [phone] (fax)

## 2019-08-19 ENCOUNTER — Ambulatory Visit (HOSPITAL_COMMUNITY)
Admission: RE | Admit: 2019-08-19 | Discharge: 2019-08-19 | Disposition: A | Discharge status: Home / Self Care | Payer: BLUE CROSS/BLUE SHIELD | Source: Ambulatory Visit | Attending: Cardiology | Admitting: Cardiology

## 2019-08-19 ENCOUNTER — Encounter (HOSPITAL_COMMUNITY): Payer: Self-pay

## 2019-08-19 ENCOUNTER — Other Ambulatory Visit: Payer: Self-pay

## 2019-08-19 VITALS — BP 142/96 | HR 59 | Wt 202.0 lb

## 2019-08-19 DIAGNOSIS — Z8674 Personal history of sudden cardiac arrest: Secondary | ICD-10-CM | POA: Diagnosis not present

## 2019-08-19 DIAGNOSIS — Z8249 Family history of ischemic heart disease and other diseases of the circulatory system: Secondary | ICD-10-CM | POA: Diagnosis not present

## 2019-08-19 DIAGNOSIS — I428 Other cardiomyopathies: Secondary | ICD-10-CM | POA: Diagnosis not present

## 2019-08-19 DIAGNOSIS — I472 Ventricular tachycardia, unspecified: Secondary | ICD-10-CM

## 2019-08-19 DIAGNOSIS — R5383 Other fatigue: Secondary | ICD-10-CM | POA: Insufficient documentation

## 2019-08-19 DIAGNOSIS — Z87891 Personal history of nicotine dependence: Secondary | ICD-10-CM | POA: Diagnosis not present

## 2019-08-19 DIAGNOSIS — Z79899 Other long term (current) drug therapy: Secondary | ICD-10-CM | POA: Diagnosis not present

## 2019-08-19 DIAGNOSIS — Z7982 Long term (current) use of aspirin: Secondary | ICD-10-CM | POA: Diagnosis not present

## 2019-08-19 DIAGNOSIS — I130 Hypertensive heart and chronic kidney disease with heart failure and stage 1 through stage 4 chronic kidney disease, or unspecified chronic kidney disease: Secondary | ICD-10-CM | POA: Diagnosis not present

## 2019-08-19 DIAGNOSIS — I5022 Chronic systolic (congestive) heart failure: Secondary | ICD-10-CM | POA: Diagnosis not present

## 2019-08-19 DIAGNOSIS — Z95810 Presence of automatic (implantable) cardiac defibrillator: Secondary | ICD-10-CM | POA: Diagnosis not present

## 2019-08-19 DIAGNOSIS — N189 Chronic kidney disease, unspecified: Secondary | ICD-10-CM | POA: Diagnosis not present

## 2019-08-19 NOTE — Patient Instructions (Signed)
Please call our office in November to schedule your echocardiogram and follow up appointemnt.  If you have any questions or concerns before your next appointment please send [place] a message through [place] or call our office at [phone].    TO LEAVE A MESSAGE FOR THE NURSE SELECT OPTION 2, PLEASE LEAVE A MESSAGE INCLUDING: . YOUR NAME . DATE OF BIRTH . CALL BACK NUMBER . REASON FOR CALL**this is important as we prioritize the call backs  YOU WILL RECEIVE A CALL BACK THE SAME DAY AS LONG AS YOU CALL BEFORE 4:00 PM  At the [facility], you and your health needs are our priority. As part of our continuing mission to provide you with exceptional heart care, we have created designated Provider Care Teams. These Care Teams include your primary Cardiologist (physician) and Advanced Practice Providers (APPs- Physician Assistants and Nurse Practitioners) who all work together to provide you with the care you need, when you need it.   You may see any of the following providers on your designated Care Team at your next follow up: Marland Kitchen Dr Arvilla Meres . Dr Marca Ancona . Tonye Becket, NP . Robbie Lis, PA . Karle Plumber, PharmD   Please be sure to bring in all your medications bottles to every appointment.

## 2019-08-24 ENCOUNTER — Other Ambulatory Visit (HOSPITAL_COMMUNITY): Payer: Self-pay | Admitting: Cardiology

## 2019-08-25 ENCOUNTER — Other Ambulatory Visit (HOSPITAL_COMMUNITY): Payer: Self-pay | Admitting: *Deleted

## 2019-08-25 ENCOUNTER — Telehealth: Payer: Self-pay | Admitting: *Deleted

## 2019-08-25 DIAGNOSIS — I472 Ventricular tachycardia: Secondary | ICD-10-CM

## 2019-08-25 DIAGNOSIS — I4729 Other ventricular tachycardia: Secondary | ICD-10-CM

## 2019-08-25 MED ORDER — ENTRESTO 49-51 MG PO TABS: 1.0000 | ORAL_TABLET | Freq: Two times a day (BID) | ORAL | 3 refills | Status: DC

## 2019-08-25 NOTE — Telephone Encounter (Signed)
LMOM requesting call back to DC. Direct number and office hours provided. 

## 2019-08-25 NOTE — Telephone Encounter (Signed)
Pt returned call. Pt reports he had labs drawn by PCP's office (Dr. Marina Goodell) prior to his appointment with Tonye Becket, NP, on 08/19/19. Advised will contact Dr. Lamar Sprinkles office tomorrow to request copy of lab work. Pt in agreement with plan.  Pt also requested refill of Entresto. Reports he will be out of pills as of tomorrow. Advised that per med list, Entresto refill was sent in today. Pt verbalizes understanding and agrees to check back with his pharmacy.

## 2019-08-25 NOTE — Telephone Encounter (Signed)
-----   Message from [name], MD sent at 08/23/2019  1:04 PM EDT ----- Remote reviewed. This remote is abnormal for increased Nonsustained VT present  Can we  please arrange for K and Mg check  Thanks SK

## 2019-08-26 NOTE — Telephone Encounter (Signed)
Received return call from staff at [facility]. Pt has not had additional lab work done by their office since 06/2019.

## 2019-08-26 NOTE — Telephone Encounter (Signed)
LMOVM for Leotis Shames, nurse at [facility]/Dr. Perry's office. Direct DC phone number and office hours provided.

## 2019-08-26 NOTE — Telephone Encounter (Signed)
Spoke with pt. Advised that his most recent labs for Dr. Marina Goodell were from 06/2019. Pt agreeable to lab appointment on 08/29/19 for BMET and Mg per Dr. Graciela Husbands. Pt aware of office location, denies additional questions at this time.

## 2019-08-29 ENCOUNTER — Other Ambulatory Visit: Payer: BLUE CROSS/BLUE SHIELD | Admitting: *Deleted

## 2019-08-29 ENCOUNTER — Other Ambulatory Visit: Payer: Self-pay

## 2019-08-29 DIAGNOSIS — I472 Ventricular tachycardia: Secondary | ICD-10-CM | POA: Diagnosis not present

## 2019-08-29 DIAGNOSIS — I4729 Other ventricular tachycardia: Secondary | ICD-10-CM

## 2019-08-29 LAB — BASIC METABOLIC PANEL
BUN/Creatinine Ratio: 15 (ref 9–20)
BUN: 19 mg/dL (ref 6–24)
CO2: 22 mmol/L (ref 20–29)
Calcium: 9.5 mg/dL (ref 8.7–10.2)
Chloride: 106 mmol/L (ref 96–106)
Creatinine, Ser: 1.27 mg/dL (ref 0.76–1.27)
GFR calc Af Amer: 71 mL/min/{1.73_m2} (ref >59)
GFR calc non Af Amer: 61 mL/min/{1.73_m2} (ref >59)
Glucose: 109 mg/dL — ABNORMAL HIGH (ref 65–99)
Potassium: 4.6 mmol/L (ref 3.5–5.2)
Sodium: 137 mmol/L (ref 134–144)

## 2019-08-29 LAB — MAGNESIUM: Magnesium: 2 mg/dL (ref 1.6–2.3)

## 2019-09-09 ENCOUNTER — Other Ambulatory Visit (HOSPITAL_COMMUNITY): Payer: BLUE CROSS/BLUE SHIELD

## 2019-09-22 ENCOUNTER — Other Ambulatory Visit: Payer: Self-pay

## 2019-09-22 ENCOUNTER — Ambulatory Visit (HOSPITAL_COMMUNITY)
Admission: RE | Admit: 2019-09-22 | Discharge: 2019-09-22 | Disposition: A | Discharge status: Home / Self Care | Payer: BLUE CROSS/BLUE SHIELD | Source: Ambulatory Visit | Attending: Adult Health | Admitting: Adult Health

## 2019-09-22 DIAGNOSIS — I082 Rheumatic disorders of both aortic and tricuspid valves: Secondary | ICD-10-CM | POA: Insufficient documentation

## 2019-09-22 DIAGNOSIS — I5022 Chronic systolic (congestive) heart failure: Secondary | ICD-10-CM

## 2019-09-22 DIAGNOSIS — I428 Other cardiomyopathies: Secondary | ICD-10-CM | POA: Insufficient documentation

## 2019-09-22 DIAGNOSIS — I110 Hypertensive heart disease with heart failure: Secondary | ICD-10-CM | POA: Insufficient documentation

## 2019-09-22 DIAGNOSIS — Z95810 Presence of automatic (implantable) cardiac defibrillator: Secondary | ICD-10-CM | POA: Diagnosis not present

## 2019-09-22 NOTE — Progress Notes (Signed)
  Echocardiogram 2D Echocardiogram has been performed.  Celene Skeen 09/22/2019, 9:30 AM

## 2019-11-12 ENCOUNTER — Ambulatory Visit (INDEPENDENT_AMBULATORY_CARE_PROVIDER_SITE_OTHER): Payer: BLUE CROSS/BLUE SHIELD | Admitting: *Deleted

## 2019-11-12 DIAGNOSIS — I469 Cardiac arrest, cause unspecified: Secondary | ICD-10-CM | POA: Diagnosis not present

## 2019-11-13 LAB — CUP PACEART REMOTE DEVICE CHECK
Battery Remaining Longevity: 81 mo
Battery Voltage: 2.99 V
Brady Statistic RV Percent Paced: 0.02 %
Date Time Interrogation Session: 20210825213524
HighPow Impedance: 76 Ohm
Implantable Lead Implant Date: 20160829
Implantable Lead Location: 753860
Implantable Pulse Generator Implant Date: 20160829
Lead Channel Impedance Value: 380 Ohm
Lead Channel Impedance Value: 494 Ohm
Lead Channel Pacing Threshold Amplitude: 0.75 V
Lead Channel Pacing Threshold Pulse Width: 0.4 ms
Lead Channel Sensing Intrinsic Amplitude: 23.25 mV
Lead Channel Sensing Intrinsic Amplitude: 23.25 mV
Lead Channel Setting Pacing Amplitude: 2 V
Lead Channel Setting Pacing Pulse Width: 0.4 ms
Lead Channel Setting Sensing Sensitivity: 0.3 mV

## 2019-11-17 NOTE — Progress Notes (Signed)
Remote ICD transmission.   

## 2019-12-31 ENCOUNTER — Other Ambulatory Visit: Payer: Self-pay | Admitting: Internal Medicine

## 2020-01-20 ENCOUNTER — Other Ambulatory Visit (HOSPITAL_COMMUNITY): Payer: Self-pay | Admitting: Cardiology

## 2020-01-23 ENCOUNTER — Other Ambulatory Visit: Payer: Self-pay | Admitting: Internal Medicine

## 2020-01-26 ENCOUNTER — Other Ambulatory Visit (HOSPITAL_COMMUNITY): Payer: Self-pay | Admitting: *Deleted

## 2020-01-27 ENCOUNTER — Other Ambulatory Visit (HOSPITAL_COMMUNITY): Payer: Self-pay | Admitting: Adult Health

## 2020-01-27 ENCOUNTER — Other Ambulatory Visit: Payer: Self-pay | Admitting: Legal Medicine

## 2020-01-27 ENCOUNTER — Telehealth (HOSPITAL_COMMUNITY): Payer: Self-pay | Admitting: Cardiology

## 2020-01-27 ENCOUNTER — Other Ambulatory Visit: Payer: Self-pay | Admitting: Internal Medicine

## 2020-01-27 DIAGNOSIS — F5221 Male erectile disorder: Secondary | ICD-10-CM

## 2020-01-27 DIAGNOSIS — E782 Mixed hyperlipidemia: Secondary | ICD-10-CM

## 2020-01-27 DIAGNOSIS — J9601 Acute respiratory failure with hypoxia: Secondary | ICD-10-CM

## 2020-01-27 NOTE — Telephone Encounter (Signed)
Pt request refills for mexiletine, lovastatin, and eplerenone. Please send scripts to [facility] [place], pt scheduled appt w/[facility]

## 2020-02-08 ENCOUNTER — Other Ambulatory Visit: Payer: Self-pay | Admitting: Internal Medicine

## 2020-02-11 ENCOUNTER — Ambulatory Visit (INDEPENDENT_AMBULATORY_CARE_PROVIDER_SITE_OTHER): Payer: BLUE CROSS/BLUE SHIELD

## 2020-02-11 DIAGNOSIS — I469 Cardiac arrest, cause unspecified: Secondary | ICD-10-CM | POA: Diagnosis not present

## 2020-02-13 LAB — CUP PACEART REMOTE DEVICE CHECK
Battery Remaining Longevity: 76 mo
Battery Voltage: 2.98 V
Brady Statistic RV Percent Paced: 0.05 %
Date Time Interrogation Session: 20211125223728
HighPow Impedance: 71 Ohm
Implantable Lead Implant Date: 20160829
Implantable Lead Location: 753860
Implantable Pulse Generator Implant Date: 20160829
Lead Channel Impedance Value: 342 Ohm
Lead Channel Impedance Value: 437 Ohm
Lead Channel Pacing Threshold Amplitude: 0.625 V
Lead Channel Pacing Threshold Pulse Width: 0.4 ms
Lead Channel Sensing Intrinsic Amplitude: 19.25 mV
Lead Channel Sensing Intrinsic Amplitude: 19.25 mV
Lead Channel Setting Pacing Amplitude: 2 V
Lead Channel Setting Pacing Pulse Width: 0.4 ms
Lead Channel Setting Sensing Sensitivity: 0.3 mV

## 2020-02-15 NOTE — Progress Notes (Signed)
Date:  02/16/2020   ID:  Brian Noble, DOB Oct 20, 1960, MRN [id number]  Location: Home  Provider location: [place] [facility] Type of Visit: Established patient   PCP:  [name], MD  Cardiologist:  No primary care provider on file. Primary HF: Dr Shirlee Latch EP: Dr Graciela Husbands   Chief Complaint: Heart Failure   History of Present Illness: Brian Noble is a 59 y.o. male with a history of cardiomyopathy who developed a cardiac arrest and was found to have low EF and normal coronaries.  Per patient, he was found to have low EF around 20% about 8 years ago.  He was seen by a cardiologist in [place] (he thinks), and EF went back to normal range.  He had no problems up until 8/16.  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  He was noted to pass out by family.  EMS was called and arrived quickly.  He was in either ventricular fibrillation or VT and was defibrillated.  He was cooled and sent for coronary angiography, which showed no significant coronary disease.  Echo showed EF 10% with severe RV dysfunction.  After recovery, he got a Medtronic ICD.  Repeat echo in 11/16 showed EF 20% with septal-lateral dyssynchrony, moderately decreased RV systolic function.  Unable to take Bidil due to severe headaches.    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 Appropriate shock due to VT. ECHO completed and showed EF 20-25% and normal RV. Carvedilol increased to 18.75 mg twice a day. He was discharged on 12/20/17. He had follow up with [name] EP NP and carvedilol was increased to 25 mg twice a day.   RHC/LHC was done in 11/19, showing no significant CAD and optimized filling pressures with preserved CI 2.31. CPX in 11/19 showed deconditioning but no significant cardiac limitation.    Today he returns for HF follow up.Overall feeling fine. Mild SOB with exertion. Denies PND/Orthopnea. Appetite ok. No fever or chills. Weight at home 196-201  pounds. Taking  all medications but he has not had diuretics for the last few days. Continues to work full time.    Past Medical History:  Diagnosis Date  . Arthritis   . Chronic kidney disease   . Chronic systolic CHF (congestive heart failure) (HCC)    a. Echo 8/16:  EF 10%, diff HK, Gr 1 DD, mild dilated aortic root, mild MR, mild LAE, severely reduced RVSF  . Hypertension   . ICD (implantable cardioverter-defibrillator) in place   . NICM (nonischemic cardiomyopathy) (HCC)    a. LHC 8/16:  normal coronary arteries.   . VF (ventricular fibrillation) (HCC)    s/p OOH cardiac arrest 8/16 >> s/p ICD   Past Surgical History:  Procedure Laterality Date  . APPENDECTOMY    . CARDIAC CATHETERIZATION N/A 11/10/2014   Procedure: Left Heart Cath and Coronary Angiography;  Surgeon: [name], MD;  Location: [facility] INVASIVE CV LAB;  Service: Cardiovascular;  Laterality: N/A;  . EP IMPLANTABLE DEVICE N/A 11/16/2014   Procedure: ICD Implant;  Surgeon: [name], MD;  Location: [facility] INVASIVE CV LAB;  Service: Cardiovascular;  Laterality: N/A;  . RIGHT HEART CATH AND CORONARY ANGIOGRAPHY N/A 01/30/2018   Procedure: RIGHT HEART CATH AND CORONARY ANGIOGRAPHY;  Surgeon: Laurey Morale, MD;  Location: [facility] INVASIVE CV LAB;  Service: Cardiovascular;  Laterality: N/A;  . ULTRASOUND GUIDANCE FOR VASCULAR ACCESS  01/30/2018   Procedure: Ultrasound Guidance For Vascular  Access;  Surgeon: Laurey Morale, MD;  Location: [facility] INVASIVE CV LAB;  Service: Cardiovascular;;     Current Outpatient Medications  Medication Sig Dispense Refill  . albuterol (VENTOLIN HFA) 108 (90 Base) MCG/ACT inhaler INHALE 2 PUFFS BY MOUTH 3 TIMES A DAY 8.5 each 6  . aspirin EC 81 MG tablet Take 81 mg by mouth daily.    . carvedilol (COREG) 12.5 MG tablet Take 18.75 mg by mouth 2 (two) times daily with a meal.    . colchicine 0.6 MG tablet TAKE ONE TABLET BY MOUTH ONCE DAILY AS NEEDED FOR FLARE UPS 15 tablet 0  . ENTRESTO 49-51 MG TAKE 1  TABLET BY MOUTH TWICE A DAY 60 tablet 3  . eplerenone (INSPRA) 25 MG tablet TAKE 1 TABLET BY MOUTH EVERY DAY 90 tablet 0  . furosemide (LASIX) 40 MG tablet TAKE 1 TABLET BY MOUTH EVERY OTHER DAY (Patient taking differently: 40 mg. ) 45 tablet 2  . lovastatin (MEVACOR) 20 MG tablet TAKE 1 TABLET BY MOUTH EVERY DAY 90 tablet 2  . magnesium oxide (MAG-OX) 400 MG tablet Take 400 mg by mouth 2 (two) times a day.     . mexiletine (MEXITIL) 200 MG capsule Take 1 capsule (200 mg total) by mouth 2 (two) times daily. Please make overdue appt with Dr. Graciela Husbands before anymore refills. Thank you 2nd attempt 30 capsule 0  . sildenafil (REVATIO) 20 MG tablet TAKE 1/2 TABLET BY MOUTH DAILY AS NEEDED FOR ERECTILE DYSFUNCTION 10 tablet 6   No current facility-administered medications for this encounter.    Allergies:   Patient has no known allergies.   Social History:  The patient  reports that he has never smoked. He quit smokeless tobacco use about 21 years ago.  His smokeless tobacco use included chew. He reports that he does not drink alcohol and does not use drugs.   Family History:  The patient's family history includes Heart disease in his sister and sister; Hypertension in his brother, brother, brother, brother, father, mother, sister, sister, and sister; Sudden death in his father.   ROS:  Please see the history of present illness.   All other systems are personally reviewed and negative.  Vitals:   02/16/20 0819  BP: (!) 140/98  Pulse: 66  SpO2: 98%   Wt Readings from Last 3 Encounters:  02/16/20 91.2 kg (201 lb)  08/19/19 91.6 kg (202 lb)  08/04/19 93.4 kg (206 lb)   General:  Well appearing. No resp difficulty HEENT: normal Neck: supple. no JVD. Carotids 2+ bilat; no bruits. No lymphadenopathy or thryomegaly appreciated. Cor: PMI nondisplaced. Regular rate & rhythm. No rubs, gallops or murmurs. Lungs: clear Abdomen: soft, nontender, nondistended. No hepatosplenomegaly. No bruits or masses.  Good bowel sounds. Extremities: no cyanosis, clubbing, rash, edema Neuro: alert & orientedx3, cranial nerves grossly intact. moves all 4 extremities w/o difficulty. Affect pleasant   Recent Labs: 07/07/2019: BNP 132.1; Hemoglobin 15.2; Platelets 239; TSH 1.760 07/14/2019: ALT 33 08/29/2019: BUN 19; Creatinine, Ser 1.27; Magnesium 2.0; Potassium 4.6; Sodium 137  Personally reviewed   Wt Readings from Last 3 Encounters:  02/16/20 91.2 kg (201 lb)  08/19/19 91.6 kg (202 lb)  08/04/19 93.4 kg (206 lb)     ASSESSMENT AND PLAN:  1. Chronic systolic CHF: Nonischemic cardiomyopathy.  Medtronic ICD.  Possibly familial CMP given father with SCD at 14, sister with heart transplant, and 2 other sisters with "heart problems."  Cannot rule out prior viral myocarditis or role  for HTN. Echo 12/17 with EF 30%, septal-lateral dyssynchrony.  CPX in 3/17 with mild functional impairment. Echo in 10/19 with EF 20-25%.  CPX repeat 01/2018 with submaximal effort but minimal cardiac impairment, suggestive of deconditioning.  RHC in 11/19 showed optimized filling pressures with preserved cardiac index.   Medtronic Optivol- fluid index trending up. Activity ~ 4 hours, No VT, No A fib  NYHA II. Volume status stable. Change lasix to as needed with addition of farxiga.   - Continue Coreg back to 18.75 mg bid.  Intolerant 25 mg twice a day due to profound fatigue.  - Continue Entresto to 49/51 bid   - Unable to tolerate Bidil due to headaches.  - Continue eplerenone 25 mg daily.  - Add farxiga 10 mg daily. Discussed purpose of new medications.   - Given possible familial CMP, will need to talk to Mr Nanna at next appointment about [facility] genetic testing.  - Patient has IVCD, 140 msec on ECG today.  Given that this is not a true LBBB and QRS < 150, probably not good CRT candidate.   - Check BMET today and in 7 days.   2. VT: Has Medtronic ICD.  VT with syncope in 10/19.   Discussed with device clinic. Had some short  few seconds of NSVT.  Check BMET and Mag level today   3 .Suspect sleep apnea: Daytime fatigue and sleepiness He is interested in a home sleep stud however  - We will set up home sleep study with Dr Mayford Knife to read.  - I will follow up on results.   Follow up 2 months with Dr Shirlee Latch. Greater than 50% of the (total minutes 25) visit spent in counseling/coordination of care regarding the above.      Waneta Martins, NP  02/16/2020 8:45 AM  [facility] [address] [facility] [place] [address] [phone] (office) [phone] (fax)

## 2020-02-16 ENCOUNTER — Other Ambulatory Visit: Payer: Self-pay

## 2020-02-16 ENCOUNTER — Telehealth: Payer: Self-pay

## 2020-02-16 ENCOUNTER — Encounter (HOSPITAL_COMMUNITY): Payer: Self-pay

## 2020-02-16 ENCOUNTER — Ambulatory Visit (HOSPITAL_COMMUNITY)
Admission: RE | Admit: 2020-02-16 | Discharge: 2020-02-16 | Disposition: A | Discharge status: Home / Self Care | Payer: BLUE CROSS/BLUE SHIELD | Source: Ambulatory Visit | Attending: Adult Health | Admitting: Adult Health

## 2020-02-16 VITALS — BP 140/98 | HR 66 | Wt 201.0 lb

## 2020-02-16 DIAGNOSIS — I130 Hypertensive heart and chronic kidney disease with heart failure and stage 1 through stage 4 chronic kidney disease, or unspecified chronic kidney disease: Secondary | ICD-10-CM | POA: Insufficient documentation

## 2020-02-16 DIAGNOSIS — R5383 Other fatigue: Secondary | ICD-10-CM | POA: Insufficient documentation

## 2020-02-16 DIAGNOSIS — Z79899 Other long term (current) drug therapy: Secondary | ICD-10-CM | POA: Diagnosis not present

## 2020-02-16 DIAGNOSIS — I472 Ventricular tachycardia: Secondary | ICD-10-CM | POA: Insufficient documentation

## 2020-02-16 DIAGNOSIS — Z95810 Presence of automatic (implantable) cardiac defibrillator: Secondary | ICD-10-CM

## 2020-02-16 DIAGNOSIS — N189 Chronic kidney disease, unspecified: Secondary | ICD-10-CM | POA: Diagnosis not present

## 2020-02-16 DIAGNOSIS — Z8249 Family history of ischemic heart disease and other diseases of the circulatory system: Secondary | ICD-10-CM | POA: Diagnosis not present

## 2020-02-16 DIAGNOSIS — Z7982 Long term (current) use of aspirin: Secondary | ICD-10-CM | POA: Diagnosis not present

## 2020-02-16 DIAGNOSIS — I428 Other cardiomyopathies: Secondary | ICD-10-CM | POA: Insufficient documentation

## 2020-02-16 DIAGNOSIS — I5022 Chronic systolic (congestive) heart failure: Secondary | ICD-10-CM | POA: Insufficient documentation

## 2020-02-16 DIAGNOSIS — Z8674 Personal history of sudden cardiac arrest: Secondary | ICD-10-CM | POA: Diagnosis not present

## 2020-02-16 DIAGNOSIS — I4729 Other ventricular tachycardia: Secondary | ICD-10-CM

## 2020-02-16 LAB — BASIC METABOLIC PANEL
Anion gap: 11 (ref 5–15)
BUN: 10 mg/dL (ref 6–20)
CO2: 24 mmol/L (ref 22–32)
Calcium: 9.5 mg/dL (ref 8.9–10.3)
Chloride: 103 mmol/L (ref 98–111)
Creatinine, Ser: 1.23 mg/dL (ref 0.61–1.24)
GFR, Estimated: >60 mL/min (ref >60)
Glucose, Bld: 101 mg/dL — ABNORMAL HIGH (ref 70–99)
Potassium: 5 mmol/L (ref 3.5–5.1)
Sodium: 138 mmol/L (ref 135–145)

## 2020-02-16 LAB — MAGNESIUM: Magnesium: 2 mg/dL (ref 1.7–2.4)

## 2020-02-16 MED ORDER — DAPAGLIFLOZIN PROPANEDIOL 10 MG PO TABS: 10.0000 mg | ORAL_TABLET | Freq: Every day | ORAL | 3 refills | Status: DC

## 2020-02-16 MED ORDER — FUROSEMIDE 40 MG PO TABS: 40.0000 mg | ORAL_TABLET | ORAL | 2 refills | Status: DC | PRN

## 2020-02-16 NOTE — Telephone Encounter (Signed)
Scheduled Carelink report received showing 1 treated VF event on 11/16/19.  EGM Appears polymorhphic VT, 2 rounds of ATP visible, with late break/ spontaneous conversion.    Meds include- Carvedilol 18.75mg  BID, Mexiletine 200mg  BID   Attempted to reach pt to determine if he recalls symptoms, confirm med compliance and educate on [facility] regulations.  No answer, LVM to return call to clinic.    Patient scheduled for in-clinic check with A. Tillery, PA on 02/17/20.  AET.

## 2020-02-16 NOTE — Progress Notes (Signed)
Electrophysiology Office Note Date: 02/17/2020  ID:  ALPHUS ZECK, DOB 1960-03-23, MRN [id number]  PCP: [name], MD Primary Cardiologist: No primary care provider on file. Electrophysiologist: Sherryl Manges, MD   CC: Routine ICD follow-up  Brian Noble is a 59 y.o. male Seen in follow-up for aborted cardiac arrest in the context of nonischemic cardiomyopathy and hypokalemia.  8/16>> ICD.  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  Egrams reviewed he was seen by Dr. Leonia Reeves who started him on mexiletine.\ No arrhythmias since then.  He is tolerating mexiletine  Pt noted to have VF event 11/16/2019 on transmission 11/25 with failed ATP x 2 then spontaneous conversion.  Pt seen in HF clinic 11/29. Started on farxiga. Labs that day K 5.0, Mg 2.0, Cr 1.23.  Pt doesn't specifically remember any symptoms 8/29. Otherwise he is doing well. He has previously not tolerated max dose coreg due to fatigue and declines titration today. He denies symptoms of palpitations, chest pain, shortness of breath, orthopnea, PND, lower extremity edema, claudication, dizziness, presyncope, syncope, bleeding, or neurologic sequela. The patient is tolerating medications without difficulties.     Appropriate Therapy yes  8/20 VT/VFl (CL 220 msec>> failed ATP + shock---- dirty break with VT NS )  Inappropriate Therapy  Antiarrhythmics Date   mexiletine  8/20       DATE TEST EF   11/16    Echo 20 %   12/17    Echo 30 %   10/19 Echo  20-25%   11/19 LHC/RHC  No Obs CAD Normal filling press  7/21 Echo 25-30%     Date Cr K Mg  12/17  1.17 5.0 2.1  8/20 1.27 4.0 2.0(5/20)     Device History: Medtronic Single Chamber ICD implanted 2016 for aborted cardiac arrest History of appropriate therapy: Yes History of AAD therapy: Yes; currently on mexitil   Past Medical History:  Diagnosis Date  . Arthritis   . Chronic kidney  disease   . Chronic systolic CHF (congestive heart failure) (HCC)    a. Echo 8/16:  EF 10%, diff HK, Gr 1 DD, mild dilated aortic root, mild MR, mild LAE, severely reduced RVSF  . Hypertension   . ICD (implantable cardioverter-defibrillator) in place   . NICM (nonischemic cardiomyopathy) (HCC)    a. LHC 8/16:  normal coronary arteries.   . VF (ventricular fibrillation) (HCC)    s/p OOH cardiac arrest 8/16 >> s/p ICD   Past Surgical History:  Procedure Laterality Date  . APPENDECTOMY    . CARDIAC CATHETERIZATION N/A 11/10/2014   Procedure: Left Heart Cath and Coronary Angiography;  Surgeon: [name], MD;  Location: [facility] INVASIVE CV LAB;  Service: Cardiovascular;  Laterality: N/A;  . EP IMPLANTABLE DEVICE N/A 11/16/2014   Procedure: ICD Implant;  Surgeon: [name], MD;  Location: [facility] INVASIVE CV LAB;  Service: Cardiovascular;  Laterality: N/A;  . RIGHT HEART CATH AND CORONARY ANGIOGRAPHY N/A 01/30/2018   Procedure: RIGHT HEART CATH AND CORONARY ANGIOGRAPHY;  Surgeon: Laurey Morale, MD;  Location: [facility] INVASIVE CV LAB;  Service: Cardiovascular;  Laterality: N/A;  . ULTRASOUND GUIDANCE FOR VASCULAR ACCESS  01/30/2018   Procedure: Ultrasound Guidance For Vascular Access;  Surgeon: Laurey Morale, MD;  Location: [facility] INVASIVE CV LAB;  Service: Cardiovascular;;    Current Outpatient Medications  Medication Sig Dispense Refill  . albuterol (VENTOLIN HFA) 108 (90  Base) MCG/ACT inhaler INHALE 2 PUFFS BY MOUTH 3 TIMES A DAY (Patient taking differently: as needed. ) 8.5 each 6  . aspirin EC 81 MG tablet Take 81 mg by mouth daily.    . carvedilol (COREG) 12.5 MG tablet Take 18.75 mg by mouth 2 (two) times daily with a meal.    . colchicine 0.6 MG tablet TAKE ONE TABLET BY MOUTH ONCE DAILY AS NEEDED FOR FLARE UPS 15 tablet 0  . dapagliflozin propanediol (FARXIGA) 10 MG TABS tablet Take 1 tablet (10 mg total) by mouth daily before breakfast. 30 tablet 3  . ENTRESTO 49-51 MG TAKE 1 TABLET BY  MOUTH TWICE A DAY 60 tablet 3  . eplerenone (INSPRA) 25 MG tablet TAKE 1 TABLET BY MOUTH EVERY DAY 90 tablet 0  . furosemide (LASIX) 40 MG tablet Take 1 tablet (40 mg total) by mouth as needed. 15 tablet 2  . lovastatin (MEVACOR) 20 MG tablet TAKE 1 TABLET BY MOUTH EVERY DAY 90 tablet 2  . magnesium oxide (MAG-OX) 400 MG tablet Take 400 mg by mouth 2 (two) times a day.     . mexiletine (MEXITIL) 200 MG capsule Take 1 capsule (200 mg total) by mouth 2 (two) times daily. Please make overdue appt with Dr. Graciela Husbands before anymore refills. Thank you 2nd attempt 60 capsule 6  . sildenafil (REVATIO) 20 MG tablet TAKE 1/2 TABLET BY MOUTH DAILY AS NEEDED FOR ERECTILE DYSFUNCTION 10 tablet 6   No current facility-administered medications for this visit.    Allergies:   Patient has no known allergies.   Social History: Social History   Socioeconomic History  . Marital status: Married    Spouse name: Not on file  . Number of children: 2  . Years of education: 34  . Highest education level: 12th grade  Occupational History  . Not on file  Tobacco Use  . Smoking status: Never Smoker  . Smokeless tobacco: Former Neurosurgeon    Types: Engineer, drilling  . Vaping Use: Never used  Substance and Sexual Activity  . Alcohol use: No  . Drug use: No  . Sexual activity: Yes  Other Topics Concern  . Not on file  Social History Narrative  . Not on file   Social Determinants of Health   Financial Resource Strain:   . Difficulty of Paying Living Expenses: Not on file  Food Insecurity:   . Worried About Programme researcher, broadcasting/film/video in the Last Year: Not on file  . Ran Out of Food in the Last Year: Not on file  Transportation Needs:   . Lack of Transportation (Medical): Not on file  . Lack of Transportation (Non-Medical): Not on file  Physical Activity:   . Days of Exercise per Week: Not on file  . Minutes of Exercise per Session: Not on file  Stress:   . Feeling of Stress : Not on file  Social Connections:   .  Frequency of Communication with Friends and Family: Not on file  . Frequency of Social Gatherings with Friends and Family: Not on file  . Attends Religious Services: Not on file  . Active Member of Clubs or Organizations: Not on file  . Attends Banker Meetings: Not on file  . Marital Status: Not on file  Intimate Partner Violence:   . Fear of Current or Ex-Partner: Not on file  . Emotionally Abused: Not on file  . Physically Abused: Not on file  . Sexually Abused: Not on  file    Family History: Family History  Problem Relation Age of Onset  . Hypertension Father   . Sudden death Father   . Hypertension Mother   . Heart disease Sister   . Heart disease Sister   . Hypertension Sister   . Hypertension Sister   . Hypertension Brother   . Hypertension Brother   . Hypertension Brother   . Hypertension Brother   . Hypertension Sister     Review of Systems: All other systems reviewed and are otherwise negative except as noted above.   Physical Exam: Vitals:   02/17/20 0810  BP: 128/80  Pulse: (!) 58  SpO2: 96%  Weight: 200 lb (90.7 kg)  Height: 5\' 8"  (1.727 m)     GEN- The patient is well appearing, alert and oriented x 3 today.   HEENT: normocephalic, atraumatic; sclera clear, conjunctiva pink; hearing intact; oropharynx clear; neck supple, no JVP Lymph- no cervical lymphadenopathy Lungs- Clear to ausculation bilaterally, normal work of breathing.  No wheezes, rales, rhonchi Heart- Regular rate and rhythm, no murmurs, rubs or gallops, PMI not laterally displaced GI- soft, non-tender, non-distended, bowel sounds present, no hepatosplenomegaly Extremities- no clubbing or cyanosis. No edema; DP/PT/radial pulses 2+ bilaterally MS- no significant deformity or atrophy Skin- warm and dry, no rash or lesion; ICD pocket well healed Psych- euthymic mood, full affect Neuro- strength and sensation are intact  ICD interrogation- reviewed in detail today,  See PACEART  report  EKG:  EKG is ordered today. Personal review of EKG shows Sinus brady at 58 bpm, PR interval 238 ms, QRS 144 ms in a LBBB pattern.    Recent Labs: 07/07/2019: BNP 132.1; Hemoglobin 15.2; Platelets 239; TSH 1.760 07/14/2019: ALT 33 02/16/2020: BUN 10; Creatinine, Ser 1.23; Magnesium 2.0; Potassium 5.0; Sodium 138   Wt Readings from Last 3 Encounters:  02/17/20 200 lb (90.7 kg)  02/16/20 201 lb (91.2 kg)  08/19/19 202 lb (91.6 kg)     Other studies Reviewed: Additional studies/ records that were reviewed today include: HF clinic notes, EP notes, previous remotes   Assessment and Plan:  1.  Chronic systolic dysfunction due to NICM s/p Medtronic single chamber ICD  Echo 09/2019 LVEF 25-30% NYHA I-II symptoms currently. Working 8 hour days without limitation.  euvolemic today Stable on an appropriate medical regimen Normal ICD function See Pace Art report No changes today Sees HF clinic. Farxiga added 11/29. Planning CPX He has a LBBB and QRS of 144 ms. Not barostim candidate at this time.   2. VT Appropriate therapy 11/16/2019. Episode shows 3 ATP with late break. AVG rate of 240. Labs in HF clinic stable. K 5.0, Mg 2.0 Continue mexitil Advised no driving per [place] law x 6 months after appropriate therapy.  As he has not had recurrent in >3 months, no change at this time. Consider increasing after he has been started onto farxiga.    Current medicines are reviewed at length with the patient today.   The patient does not have concerns regarding his medicines.  The following changes were made today:  none  Labs/ tests ordered today include:  Orders Placed This Encounter  Procedures  . EKG 12-Lead   Disposition:   Follow up with Dr. 11/18/2019  6 months with recent ATP  Signed, Graciela Husbands, PA-C  02/17/2020 9:35 AM  [facility] [address] [place] [phone] (office) [phone] (fax)

## 2020-02-16 NOTE — Patient Instructions (Addendum)
START Brian Noble 10mg  (1 tablet) Daily  Take Lasix (Furosemide) AS NEEDED for swelling  Labs done today, your results will be available in MyChart, we will contact you for abnormal readings.  Your physician has recommended that you have a cardiopulmonary stress test (CPX). CPX testing is a non-invasive measurement of heart and lung function. It replaces a traditional treadmill stress test. This type of test provides a tremendous amount of information that relates not only to your present condition but also for future outcomes. This test combines measurements of you ventilation, respiratory gas exchange in the lungs, electrocardiogram (EKG), blood pressure and physical response before, during, and following an exercise protocol.  Your physician recommends that you schedule repeat labs in 7-10 days  Your physician recommends that you schedule a follow-up appointment in: 2 months with Dr. 9-10  If you have any questions or concerns before your next appointment please send Shirlee Latch a message through [facility] or call our office at [phone].    TO LEAVE A MESSAGE FOR THE NURSE SELECT OPTION 2, PLEASE LEAVE A MESSAGE INCLUDING: . YOUR NAME . DATE OF BIRTH . CALL BACK NUMBER . REASON FOR CALL**this is important as we prioritize the call backs  YOU WILL RECEIVE A CALL BACK THE SAME DAY AS LONG AS YOU CALL BEFORE 4:00 PM

## 2020-02-17 ENCOUNTER — Encounter: Payer: Self-pay | Admitting: Student

## 2020-02-17 ENCOUNTER — Ambulatory Visit (INDEPENDENT_AMBULATORY_CARE_PROVIDER_SITE_OTHER): Payer: BLUE CROSS/BLUE SHIELD | Admitting: Student

## 2020-02-17 ENCOUNTER — Other Ambulatory Visit: Payer: Self-pay

## 2020-02-17 VITALS — BP 128/80 | HR 58 | Ht 68.0 in | Wt 200.0 lb

## 2020-02-17 DIAGNOSIS — I4729 Other ventricular tachycardia: Secondary | ICD-10-CM

## 2020-02-17 DIAGNOSIS — I472 Ventricular tachycardia, unspecified: Secondary | ICD-10-CM

## 2020-02-17 DIAGNOSIS — I5022 Chronic systolic (congestive) heart failure: Secondary | ICD-10-CM

## 2020-02-17 DIAGNOSIS — Z95810 Presence of automatic (implantable) cardiac defibrillator: Secondary | ICD-10-CM

## 2020-02-17 DIAGNOSIS — I428 Other cardiomyopathies: Secondary | ICD-10-CM

## 2020-02-17 LAB — CUP PACEART INCLINIC DEVICE CHECK
Battery Remaining Longevity: 80 mo
Battery Voltage: 2.94 V
Brady Statistic RV Percent Paced: 0.02 %
Date Time Interrogation Session: 20211130092948
HighPow Impedance: 68 Ohm
Implantable Lead Implant Date: 20160829
Implantable Lead Location: 753860
Implantable Pulse Generator Implant Date: 20160829
Lead Channel Impedance Value: 399 Ohm
Lead Channel Impedance Value: 494 Ohm
Lead Channel Pacing Threshold Amplitude: 0.625 V
Lead Channel Pacing Threshold Pulse Width: 0.4 ms
Lead Channel Sensing Intrinsic Amplitude: 21.25 mV
Lead Channel Sensing Intrinsic Amplitude: 22.75 mV
Lead Channel Setting Pacing Amplitude: 2 V
Lead Channel Setting Pacing Pulse Width: 0.4 ms
Lead Channel Setting Sensing Sensitivity: 0.3 mV

## 2020-02-17 MED ORDER — MEXILETINE HCL 200 MG PO CAPS: 200.0000 mg | ORAL_CAPSULE | Freq: Two times a day (BID) | ORAL | 6 refills | Status: DC

## 2020-02-17 NOTE — Patient Instructions (Signed)
Medication Instructions:  *If you need a refill on your cardiac medications before your next appointment, please call your pharmacy*  Follow-Up: At [facility], you and your health needs are our priority.  As part of our continuing mission to provide you with exceptional heart care, we have created designated Provider Care Teams.  These Care Teams include your primary Cardiologist (physician) and Advanced Practice Providers (APPs -  Physician Assistants and Nurse Practitioners) who all work together to provide you with the care you need, when you need it.  We recommend signing up for the patient portal called "MyChart".  Sign up information is provided on this After Visit Summary.  MyChart is used to connect with patients for Virtual Visits (Telemedicine).  Patients are able to view lab/test results, encounter notes, upcoming appointments, etc.  Non-urgent messages can be sent to your provider as well.   To learn more about what you can do with MyChart, go to [email or web address].    Your next appointment:   Your physician recommends that you schedule a follow-up appointment in: 6 MONTHS with Dr. Graciela Husbands.  The format for your next appointment:   In Person with Sherryl Manges, MD or one of the following Advanced Practice Providers on your designated Care Team:    [name], NP  Francis Dowse, PA-C  Casimiro Needle "Bode" [place], [place]

## 2020-02-20 NOTE — Progress Notes (Signed)
Remote ICD transmission.   

## 2020-02-23 ENCOUNTER — Other Ambulatory Visit (HOSPITAL_COMMUNITY): Payer: Self-pay | Admitting: Adult Health

## 2020-02-23 DIAGNOSIS — I5022 Chronic systolic (congestive) heart failure: Secondary | ICD-10-CM

## 2020-02-26 ENCOUNTER — Other Ambulatory Visit: Payer: Self-pay

## 2020-02-26 ENCOUNTER — Ambulatory Visit (HOSPITAL_COMMUNITY)
Admission: RE | Admit: 2020-02-26 | Discharge: 2020-02-26 | Disposition: A | Discharge status: Home / Self Care | Payer: BLUE CROSS/BLUE SHIELD | Source: Ambulatory Visit | Attending: Internal Medicine | Admitting: Internal Medicine

## 2020-02-26 DIAGNOSIS — I5022 Chronic systolic (congestive) heart failure: Secondary | ICD-10-CM | POA: Diagnosis not present

## 2020-02-26 LAB — BASIC METABOLIC PANEL
Anion gap: 13 (ref 5–15)
BUN: 19 mg/dL (ref 6–20)
CO2: 22 mmol/L (ref 22–32)
Calcium: 9.4 mg/dL (ref 8.9–10.3)
Chloride: 104 mmol/L (ref 98–111)
Creatinine, Ser: 1.29 mg/dL — ABNORMAL HIGH (ref 0.61–1.24)
GFR, Estimated: >60 mL/min (ref >60)
Glucose, Bld: 90 mg/dL (ref 70–99)
Potassium: 4.4 mmol/L (ref 3.5–5.1)
Sodium: 139 mmol/L (ref 135–145)

## 2020-03-09 ENCOUNTER — Encounter (HOSPITAL_COMMUNITY): Payer: BLUE CROSS/BLUE SHIELD

## 2020-03-15 ENCOUNTER — Encounter (HOSPITAL_COMMUNITY): Payer: Self-pay

## 2020-03-15 ENCOUNTER — Other Ambulatory Visit (HOSPITAL_COMMUNITY): Payer: Self-pay | Admitting: Adult Health

## 2020-03-15 DIAGNOSIS — I5022 Chronic systolic (congestive) heart failure: Secondary | ICD-10-CM

## 2020-03-16 NOTE — Telephone Encounter (Signed)
Spoke to patients wife. She was able to find a [place] copay card online yesterday and plans to take it to the pharmacy today.

## 2020-03-17 ENCOUNTER — Telehealth (HOSPITAL_COMMUNITY): Payer: Self-pay

## 2020-03-17 ENCOUNTER — Other Ambulatory Visit (HOSPITAL_COMMUNITY): Payer: Self-pay | Admitting: Adult Health

## 2020-03-17 DIAGNOSIS — I5022 Chronic systolic (congestive) heart failure: Secondary | ICD-10-CM

## 2020-03-17 NOTE — Telephone Encounter (Addendum)
Patient's wife called stating patient took 2 doses of his medications (took today's morning dose and then forgot and took tomorrow's dose). Patient denies any symptoms. Per Shanda Bumps, NP: watch patient at home for signs of bradycardia and hypotension and go to ED if symptoms worsen. Advised patient's wife who verbalized understanding but said she would watch him at work due to lack of PTO.

## 2020-03-20 ENCOUNTER — Other Ambulatory Visit (HOSPITAL_COMMUNITY): Payer: Self-pay | Admitting: Adult Health

## 2020-04-22 ENCOUNTER — Encounter (HOSPITAL_COMMUNITY): Payer: BLUE CROSS/BLUE SHIELD | Admitting: Cardiology

## 2020-05-07 ENCOUNTER — Other Ambulatory Visit: Payer: Self-pay | Admitting: Internal Medicine

## 2020-05-07 DIAGNOSIS — I472 Ventricular tachycardia, unspecified: Secondary | ICD-10-CM

## 2020-05-12 ENCOUNTER — Ambulatory Visit (INDEPENDENT_AMBULATORY_CARE_PROVIDER_SITE_OTHER): Payer: BLUE CROSS/BLUE SHIELD

## 2020-05-12 DIAGNOSIS — I472 Ventricular tachycardia, unspecified: Secondary | ICD-10-CM

## 2020-05-13 LAB — CUP PACEART REMOTE DEVICE CHECK
Battery Remaining Longevity: 72 mo
Battery Voltage: 2.99 V
Brady Statistic RV Percent Paced: 0.01 %
Date Time Interrogation Session: 20220223044225
HighPow Impedance: 73 Ohm
Implantable Lead Implant Date: 20160829
Implantable Lead Location: 753860
Implantable Pulse Generator Implant Date: 20160829
Lead Channel Impedance Value: 399 Ohm
Lead Channel Impedance Value: 494 Ohm
Lead Channel Pacing Threshold Amplitude: 0.75 V
Lead Channel Pacing Threshold Pulse Width: 0.4 ms
Lead Channel Sensing Intrinsic Amplitude: 27.875 mV
Lead Channel Sensing Intrinsic Amplitude: 27.875 mV
Lead Channel Setting Pacing Amplitude: 2 V
Lead Channel Setting Pacing Pulse Width: 0.4 ms
Lead Channel Setting Sensing Sensitivity: 0.3 mV

## 2020-05-16 ENCOUNTER — Other Ambulatory Visit (HOSPITAL_COMMUNITY): Payer: Self-pay | Admitting: Cardiology

## 2020-05-17 ENCOUNTER — Other Ambulatory Visit (HOSPITAL_COMMUNITY): Payer: Self-pay | Admitting: Adult Health

## 2020-05-19 NOTE — Progress Notes (Signed)
Remote ICD transmission.   

## 2020-06-12 ENCOUNTER — Other Ambulatory Visit: Payer: Self-pay | Admitting: Student

## 2020-06-12 DIAGNOSIS — I472 Ventricular tachycardia, unspecified: Secondary | ICD-10-CM

## 2020-07-15 ENCOUNTER — Encounter: Payer: Self-pay | Admitting: Legal Medicine

## 2020-07-15 ENCOUNTER — Ambulatory Visit (INDEPENDENT_AMBULATORY_CARE_PROVIDER_SITE_OTHER): Payer: BLUE CROSS/BLUE SHIELD | Admitting: Legal Medicine

## 2020-07-15 ENCOUNTER — Other Ambulatory Visit: Payer: Self-pay

## 2020-07-15 VITALS — BP 110/64 | HR 67 | Temp 96.5°F | Ht 68.0 in | Wt 204.0 lb

## 2020-07-15 DIAGNOSIS — I472 Ventricular tachycardia, unspecified: Secondary | ICD-10-CM

## 2020-07-15 DIAGNOSIS — I4901 Ventricular fibrillation: Secondary | ICD-10-CM

## 2020-07-15 DIAGNOSIS — E782 Mixed hyperlipidemia: Secondary | ICD-10-CM

## 2020-07-15 DIAGNOSIS — N1831 Chronic kidney disease, stage 3a: Secondary | ICD-10-CM

## 2020-07-15 DIAGNOSIS — I10 Essential (primary) hypertension: Secondary | ICD-10-CM | POA: Diagnosis not present

## 2020-07-15 DIAGNOSIS — I5022 Chronic systolic (congestive) heart failure: Secondary | ICD-10-CM | POA: Diagnosis not present

## 2020-07-15 DIAGNOSIS — Z95810 Presence of automatic (implantable) cardiac defibrillator: Secondary | ICD-10-CM

## 2020-07-15 DIAGNOSIS — I5023 Acute on chronic systolic (congestive) heart failure: Secondary | ICD-10-CM

## 2020-07-15 NOTE — Assessment & Plan Note (Signed)
eF 20- 25% last echo

## 2020-07-15 NOTE — Progress Notes (Signed)
Subjective:  Patient ID: Brian Noble, male    DOB: 12-Aug-1960  Age: 60 y.o. MRN: [id number]  Chief Complaint  Patient presents with  . Hyperlipidemia  . Hypertension    HPI: chronic visit  Patient presents for follow up of hypertension.  Patient tolerating coreg well with side effects.  Patient was diagnosed with hypertension 2010 so has been treated for hypertension for 10 years.Patient is working on maintaining diet and exercise regimen and follows up as directed. Complication include CHF.  Patient presents with hyperlipidemia.  Compliance with treatment has been good; patient takes medicines as directed, maintains low cholesterol diet, follows up as directed, and maintains exercise regimen.  Patient is using lovastatin without problems.  Patient presents with HFrEF  that is stable. Diagnosis made 2018.  The course of the disease is stable.  Current medicines include carvidilol, eplereone, farxiga, mexilitine, entresto. Patient follows a low cholesterol diet and maintains a weight diary.  Patient is on low salt, low cholesterol diet and avoids alcohol.  Patient denies adverse effects of medicines. Patient is monitoring weight and has gained 4 lbd of weight.  Patient is having no pedal edema, no PND and no PND.  Patient is continuing to see cardiology. Current Outpatient Medications on File Prior to Visit  Medication Sig Dispense Refill  . albuterol (VENTOLIN HFA) 108 (90 Base) MCG/ACT inhaler INHALE 2 PUFFS BY MOUTH 3 TIMES A DAY (Patient taking differently: as needed. ) 8.5 each 6  . aspirin EC 81 MG tablet Take 81 mg by mouth daily.    . carvedilol (COREG) 12.5 MG tablet Take 1.5 tablets (18.75 mg total) by mouth 2 (two) times daily. 270 tablet 3  . colchicine 0.6 MG tablet TAKE ONE TABLET BY MOUTH ONCE DAILY AS NEEDED FOR FLARE UPS 15 tablet 0  . eplerenone (INSPRA) 25 MG tablet TAKE 1 TABLET BY MOUTH EVERY DAY 90 tablet 0  . FARXIGA 10 MG TABS tablet TAKE 1 TABLET BY MOUTH DAILY  BEFORE BREAKFAST. 30 tablet 3  . lovastatin (MEVACOR) 20 MG tablet TAKE 1 TABLET BY MOUTH EVERY DAY 90 tablet 2  . magnesium oxide (MAG-OX) 400 MG tablet Take 400 mg by mouth 2 (two) times a day.     . mexiletine (MEXITIL) 200 MG capsule TAKE 1 CAPSULE BY MOUTH 2 TIMES DAILY. PLEASE MAKE APPT BEFORE ANYMORE REFILLS. 180 capsule 0  . sacubitril-valsartan (ENTRESTO) 49-51 MG Take 1 tablet by mouth 2 (two) times daily. NEEDS APPOINTMENT FOR FUTURE REFILLS 60 tablet 3  . sildenafil (REVATIO) 20 MG tablet TAKE 1/2 TABLET BY MOUTH DAILY AS NEEDED FOR ERECTILE DYSFUNCTION 10 tablet 6   No current facility-administered medications on file prior to visit.   Past Medical History:  Diagnosis Date  . Arthritis   . Chronic kidney disease   . Chronic systolic CHF (congestive heart failure) (HCC)    a. Echo 8/16:  EF 10%, diff HK, Gr 1 DD, mild dilated aortic root, mild MR, mild LAE, severely reduced RVSF  . Hypertension   . ICD (implantable cardioverter-defibrillator) in place   . NICM (nonischemic cardiomyopathy) (HCC)    a. LHC 8/16:  normal coronary arteries.   . VF (ventricular fibrillation) (HCC)    s/p OOH cardiac arrest 8/16 >> s/p ICD   Past Surgical History:  Procedure Laterality Date  . APPENDECTOMY    . CARDIAC CATHETERIZATION N/A 11/10/2014   Procedure: Left Heart Cath and Coronary Angiography;  Surgeon: [name], MD;  Location: [facility]  INVASIVE CV LAB;  Service: Cardiovascular;  Laterality: N/A;  . EP IMPLANTABLE DEVICE N/A 11/16/2014   Procedure: ICD Implant;  Surgeon: [name], MD;  Location: [facility] INVASIVE CV LAB;  Service: Cardiovascular;  Laterality: N/A;  . RIGHT HEART CATH AND CORONARY ANGIOGRAPHY N/A 01/30/2018   Procedure: RIGHT HEART CATH AND CORONARY ANGIOGRAPHY;  Surgeon: Laurey Morale, MD;  Location: [facility] INVASIVE CV LAB;  Service: Cardiovascular;  Laterality: N/A;  . ULTRASOUND GUIDANCE FOR VASCULAR ACCESS  01/30/2018   Procedure: Ultrasound Guidance For Vascular  Access;  Surgeon: Laurey Morale, MD;  Location: [facility] INVASIVE CV LAB;  Service: Cardiovascular;;    Family History  Problem Relation Age of Onset  . Hypertension Father   . Sudden death Father   . Hypertension Mother   . Heart disease Sister   . Heart disease Sister   . Hypertension Sister   . Hypertension Sister   . Hypertension Brother   . Hypertension Brother   . Hypertension Brother   . Hypertension Brother   . Hypertension Sister    Social History   Socioeconomic History  . Marital status: Married    Spouse name: Not on file  . Number of children: 2  . Years of education: 58  . Highest education level: 12th grade  Occupational History  . Not on file  Tobacco Use  . Smoking status: Never Smoker  . Smokeless tobacco: Former Neurosurgeon    Types: Engineer, drilling  . Vaping Use: Never used  Substance and Sexual Activity  . Alcohol use: No  . Drug use: No  . Sexual activity: Yes  Other Topics Concern  . Not on file  Social History Narrative  . Not on file   Social Determinants of Health   Financial Resource Strain: Not on file  Food Insecurity: Not on file  Transportation Needs: Not on file  Physical Activity: Not on file  Stress: Not on file  Social Connections: Not on file    Review of Systems  Constitutional: Negative for chills, diaphoresis, fatigue and fever.  HENT: Negative for congestion, ear pain and sore throat.   Respiratory: Negative for cough and shortness of breath.   Cardiovascular: Negative for chest pain, palpitations and leg swelling.  Gastrointestinal: Negative for abdominal pain, constipation, diarrhea, nausea and vomiting.  Genitourinary: Negative for dysuria and urgency.  Musculoskeletal: Negative for arthralgias and myalgias.  Neurological: Negative for dizziness and headaches.  Psychiatric/Behavioral: Negative for dysphoric mood.     Objective:  BP 110/64   Pulse 67   Temp (!) 96.5 F (35.8 C)   Ht 5\' 8"  (1.727 m)   Wt 204 lb  (92.5 kg)   SpO2 97%   BMI 31.02 kg/m   BP/Weight 07/15/2020 02/17/2020 02/16/2020  Systolic BP 110 128 140  Diastolic BP 64 80 98  Wt. (Lbs) 204 200 201  BMI 31.02 30.41 30.56    Physical Exam Vitals reviewed.  Constitutional:      Appearance: Normal appearance.  HENT:     Head: Normocephalic.     Right Ear: Tympanic membrane normal.     Left Ear: Tympanic membrane normal.     Nose: Nose normal.     Mouth/Throat:     Mouth: Mucous membranes are moist.     Pharynx: Oropharynx is clear.  Eyes:     Extraocular Movements: Extraocular movements intact.     Conjunctiva/sclera: Conjunctivae normal.     Pupils: Pupils are equal, round, and reactive to  light.  Cardiovascular:     Rate and Rhythm: Normal rate and regular rhythm.     Pulses: Normal pulses.     Heart sounds: No murmur heard. Gallop (S4) present.   Pulmonary:     Effort: Pulmonary effort is normal. No respiratory distress.     Breath sounds: Normal breath sounds. No rales.  Abdominal:     General: Abdomen is flat. Bowel sounds are normal. There is no distension.     Palpations: Abdomen is soft.     Tenderness: There is no abdominal tenderness.  Musculoskeletal:        General: Normal range of motion.     Cervical back: Normal range of motion and neck supple.  Skin:    General: Skin is warm.     Capillary Refill: Capillary refill takes less than 2 seconds.  Neurological:     General: No focal deficit present.     Mental Status: He is alert and oriented to person, place, and time. Mental status is at baseline.       Lab Results  Component Value Date   WBC 8.3 07/07/2019   HGB 15.2 07/07/2019   HCT 46.7 07/07/2019   PLT 239 07/07/2019   GLUCOSE 90 02/26/2020   CHOL 153 07/07/2019   TRIG 240 (H) 07/07/2019   HDL 43 07/07/2019   LDLCALC 71 07/07/2019   ALT 33 07/14/2019   AST 27 07/14/2019   NA 139 02/26/2020   K 4.4 02/26/2020   CL 104 02/26/2020   CREATININE 1.29 (H) 02/26/2020   BUN 19  02/26/2020   CO2 22 02/26/2020   TSH 1.760 07/07/2019   INR 0.97 01/29/2018   HGBA1C 4.9 07/07/2019      Assessment & Plan:   Diagnoses and all orders for this visit: Mixed hyperlipidemia -     Lipid panel AN INDIVIDUAL CARE PLAN for hyperlipidemia/ cholesterol was established and reinforced today.  The patient's status was assessed using clinical findings on exam, lab and other diagnostic tests. The patient's disease status was assessed based on evidence-based guidelines and found to be fair controlled. MEDICATIONS were reviewed. SELF MANAGEMENT GOALS have been discussed and patient's success at attaining the goal of low cholesterol was assessed. RECOMMENDATION given include regular exercise 3 days a week and low cholesterol/low fat diet. CLINICAL SUMMARY including written plan to identify barriers unique to the patient due to social or economic  reasons was discussed.  Essential hypertension, benign -     CBC with Differential/Platelet -     Comprehensive metabolic panel An individual hypertension care plan was established and reinforced today.  The patient's status was assessed using clinical findings on exam and labs or diagnostic tests. The patient's success at meeting treatment goals on disease specific evidence-based guidelines and found to be fair controlled. SELF MANAGEMENT: The patient and I together assessed ways to personally work towards obtaining the recommended goals. RECOMMENDATIONS: avoid decongestants found in common cold remedies, decrease consumption of alcohol, perform routine monitoring of BP with home BP cuff, exercise, reduction of dietary salt, take medicines as prescribed, try not to miss doses and quit smoking.  Regular exercise and maintaining a healthy weight is needed.  Stress reduction may help. A CLINICAL SUMMARY including written plan identify barriers to care unique to individual due to social or financial issues.  We attempt to mutually creat solutions for  individual and family understanding.  Chronic systolic CHF (congestive heart failure) (HCC) An individualized care plan was established and reinforced.  The patient's disease status was assessed using clinical finding son exam today, labs, and/or other diagnostic testing such as x-rays, to determine the patient's success in meeting treatmentgoalsbased on disease-based guidelines and found to beimproving. But not at goal yet. Medications prescriptions no changes Laboratory tests ordered to be performed today include routine. RECOMMENDATIONS: given include see cardiology.  Call physician is patient gains 3 lbs in one day or 5 lbs for one week.  Call for progressive PND, orthopnea or increased pedal edema.  VF (ventricular fibrillation) (HCC) Patient has a diagnosis of permanent  atrial fibrillation.   Patient is on none and has controlled ventricular response.  Patient is CV stable . VT (ventricular tachycardia) (HCC) Patient has history of VT and now has ICD   Stage 3a chronic kidney disease (HCC) AN INDIVIDUAL CARE PLAN renal disease was established and reinforced today.  The patient's status was assessed using clinical findings on exam, labs, and other diagnostic testing. Patient's success at meeting treatment goals based on disease specific evidence-bassed guidelines and found to be in good control. RECOMMENDATIONS include maintaining present medicines and treatment.  ICD (implantable cardioverter-defibrillator) in place ICD is present and working well   35 minute visit, review of cardiology records      Follow-up: Return in about 6 months (around 01/14/2021) for fasting.  An After Visit Summary was printed and given to the patient.  Brent Bulla, MD [facility] [phone]

## 2020-07-16 LAB — CBC WITH DIFFERENTIAL/PLATELET
Basophils Absolute: 0.1 10*3/uL (ref 0.0–0.2)
Basos: 2 %
EOS (ABSOLUTE): 0.3 10*3/uL (ref 0.0–0.4)
Eos: 5 %
Hematocrit: 49.5 % (ref 37.5–51.0)
Hemoglobin: 16.2 g/dL (ref 13.0–17.7)
Immature Grans (Abs): 0 10*3/uL (ref 0.0–0.1)
Immature Granulocytes: 0 %
Lymphocytes Absolute: 1.7 10*3/uL (ref 0.7–3.1)
Lymphs: 24 %
MCH: 29.8 pg (ref 26.6–33.0)
MCHC: 32.7 g/dL (ref 31.5–35.7)
MCV: 91 fL (ref 79–97)
Monocytes Absolute: 0.7 10*3/uL (ref 0.1–0.9)
Monocytes: 10 %
Neutrophils Absolute: 4.1 10*3/uL (ref 1.4–7.0)
Neutrophils: 59 %
Platelets: 233 10*3/uL (ref 150–450)
RBC: 5.44 x10E6/uL (ref 4.14–5.80)
RDW: 12.7 % (ref 11.6–15.4)
WBC: 6.9 10*3/uL (ref 3.4–10.8)

## 2020-07-16 LAB — COMPREHENSIVE METABOLIC PANEL
ALT: 44 IU/L (ref 0–44)
AST: 40 IU/L (ref 0–40)
Albumin/Globulin Ratio: 1.7 (ref 1.2–2.2)
Albumin: 4.8 g/dL (ref 3.8–4.9)
Alkaline Phosphatase: 51 IU/L (ref 44–121)
BUN/Creatinine Ratio: 12 (ref 10–24)
BUN: 17 mg/dL (ref 8–27)
Bilirubin Total: 0.9 mg/dL (ref 0.0–1.2)
CO2: 22 mmol/L (ref 20–29)
Calcium: 9.8 mg/dL (ref 8.6–10.2)
Chloride: 102 mmol/L (ref 96–106)
Creatinine, Ser: 1.38 mg/dL — ABNORMAL HIGH (ref 0.76–1.27)
Globulin, Total: 2.9 g/dL (ref 1.5–4.5)
Glucose: 86 mg/dL (ref 65–99)
Potassium: 6 mmol/L — ABNORMAL HIGH (ref 3.5–5.2)
Sodium: 140 mmol/L (ref 134–144)
Total Protein: 7.7 g/dL (ref 6.0–8.5)
eGFR: 59 mL/min/{1.73_m2} — ABNORMAL LOW (ref >59)

## 2020-07-16 LAB — LIPID PANEL
Chol/HDL Ratio: 3.8 ratio (ref 0.0–5.0)
Cholesterol, Total: 164 mg/dL (ref 100–199)
HDL: 43 mg/dL (ref >39)
LDL Chol Calc (NIH): 92 mg/dL (ref 0–99)
Triglycerides: 167 mg/dL — ABNORMAL HIGH (ref 0–149)
VLDL Cholesterol Cal: 29 mg/dL (ref 5–40)

## 2020-07-16 LAB — CARDIOVASCULAR RISK ASSESSMENT

## 2020-07-16 NOTE — Progress Notes (Signed)
CBC normal, kidney tests stable 3a mild disease, potassium 6.0 very high- stop any potassium, recheck one week, triglycerides high 167,  lp

## 2020-07-26 ENCOUNTER — Other Ambulatory Visit: Payer: Self-pay

## 2020-07-26 DIAGNOSIS — E875 Hyperkalemia: Secondary | ICD-10-CM

## 2020-07-30 ENCOUNTER — Other Ambulatory Visit: Payer: Self-pay

## 2020-07-30 ENCOUNTER — Other Ambulatory Visit: Payer: BLUE CROSS/BLUE SHIELD

## 2020-07-30 DIAGNOSIS — E875 Hyperkalemia: Secondary | ICD-10-CM

## 2020-07-31 LAB — COMPREHENSIVE METABOLIC PANEL
ALT: 39 IU/L (ref 0–44)
AST: 32 IU/L (ref 0–40)
Albumin/Globulin Ratio: 1.7 (ref 1.2–2.2)
Albumin: 4.9 g/dL (ref 3.8–4.9)
Alkaline Phosphatase: 57 IU/L (ref 44–121)
BUN/Creatinine Ratio: 13 (ref 10–24)
BUN: 16 mg/dL (ref 8–27)
Bilirubin Total: 1 mg/dL (ref 0.0–1.2)
CO2: 23 mmol/L (ref 20–29)
Calcium: 9.6 mg/dL (ref 8.6–10.2)
Chloride: 101 mmol/L (ref 96–106)
Creatinine, Ser: 1.27 mg/dL (ref 0.76–1.27)
Globulin, Total: 2.9 g/dL (ref 1.5–4.5)
Glucose: 94 mg/dL (ref 65–99)
Potassium: 5.2 mmol/L (ref 3.5–5.2)
Sodium: 137 mmol/L (ref 134–144)
Total Protein: 7.8 g/dL (ref 6.0–8.5)
eGFR: 65 mL/min/{1.73_m2} (ref >59)

## 2020-08-01 NOTE — Progress Notes (Signed)
Kidney and liver tests normal, potassium normal lp

## 2020-08-10 ENCOUNTER — Other Ambulatory Visit: Payer: Self-pay | Admitting: Legal Medicine

## 2020-08-10 ENCOUNTER — Other Ambulatory Visit (HOSPITAL_COMMUNITY): Payer: Self-pay | Admitting: Adult Health

## 2020-08-10 DIAGNOSIS — E782 Mixed hyperlipidemia: Secondary | ICD-10-CM

## 2020-08-11 ENCOUNTER — Ambulatory Visit (INDEPENDENT_AMBULATORY_CARE_PROVIDER_SITE_OTHER): Payer: BLUE CROSS/BLUE SHIELD

## 2020-08-11 DIAGNOSIS — I469 Cardiac arrest, cause unspecified: Secondary | ICD-10-CM | POA: Diagnosis not present

## 2020-08-12 ENCOUNTER — Encounter (HOSPITAL_COMMUNITY): Payer: Self-pay

## 2020-08-12 LAB — CUP PACEART REMOTE DEVICE CHECK
Battery Remaining Longevity: 68 mo
Battery Voltage: 2.98 V
Brady Statistic RV Percent Paced: 0.03 %
Date Time Interrogation Session: 20220525222407
HighPow Impedance: 81 Ohm
Implantable Lead Implant Date: 20160829
Implantable Lead Location: 753860
Implantable Pulse Generator Implant Date: 20160829
Lead Channel Impedance Value: 456 Ohm
Lead Channel Impedance Value: 494 Ohm
Lead Channel Pacing Threshold Amplitude: 0.75 V
Lead Channel Pacing Threshold Pulse Width: 0.4 ms
Lead Channel Sensing Intrinsic Amplitude: 26.875 mV
Lead Channel Sensing Intrinsic Amplitude: 26.875 mV
Lead Channel Setting Pacing Amplitude: 2 V
Lead Channel Setting Pacing Pulse Width: 0.4 ms
Lead Channel Setting Sensing Sensitivity: 0.3 mV

## 2020-08-17 ENCOUNTER — Other Ambulatory Visit (HOSPITAL_COMMUNITY): Payer: Self-pay | Admitting: Adult Health

## 2020-08-17 ENCOUNTER — Other Ambulatory Visit (HOSPITAL_COMMUNITY): Payer: Self-pay | Admitting: Cardiology

## 2020-08-17 ENCOUNTER — Telehealth: Payer: Self-pay

## 2020-08-17 NOTE — Telephone Encounter (Signed)
Patient wife called in and wants a nurse to call to go over last transmission

## 2020-08-18 NOTE — Telephone Encounter (Signed)
Spoke with wife Brian Noble on [place]. Patient had " beeping " sound from ICD while in his work shop. probable magnet response but unable to send remote transmission due to issue with home remote monitor. Provided with [employer] support # to trouble shoot monitor when patient gets home from work. Made aware that patient received ATP for VT episode on 05/16/20. Reviewed  DMV driving restrictions and [name] reports patient has reported no s/sx associated with event. Patient has no cell service at work and contact # listed is for wife for calls. Patient overdue for f/u with Dr Graciela Husbands in May, 2022. [name] will expect call from scheduler to schedule appointment.

## 2020-08-26 ENCOUNTER — Other Ambulatory Visit (HOSPITAL_COMMUNITY): Payer: Self-pay

## 2020-08-26 ENCOUNTER — Other Ambulatory Visit: Payer: Self-pay

## 2020-08-26 DIAGNOSIS — I472 Ventricular tachycardia, unspecified: Secondary | ICD-10-CM

## 2020-08-27 MED ORDER — MEXILETINE HCL 200 MG PO CAPS: 200.0000 mg | ORAL_CAPSULE | Freq: Two times a day (BID) | ORAL | 1 refills | Status: DC

## 2020-08-29 ENCOUNTER — Encounter (HOSPITAL_COMMUNITY): Payer: Self-pay

## 2020-08-30 ENCOUNTER — Other Ambulatory Visit (HOSPITAL_COMMUNITY): Payer: Self-pay

## 2020-08-30 MED ORDER — EPLERENONE 25 MG PO TABS
25.0000 mg | ORAL_TABLET | Freq: Every day | ORAL | 0 refills | Status: DC
Start: 2020-08-30 — End: 2020-10-12

## 2020-08-30 MED ORDER — ENTRESTO 49-51 MG PO TABS
1.0000 | ORAL_TABLET | Freq: Two times a day (BID) | ORAL | 0 refills | Status: DC
Start: 2020-08-30 — End: 2020-09-25

## 2020-08-30 NOTE — Telephone Encounter (Addendum)
I called and left the patient a message. With the co-pay card, the Marcelline Deist would be $363, we can go ahead and apply for assistance given that the insurance will not be valid after this month. We can provide samples of each medication Sherryll Burger and [place]) while we wait for determinations of assistance.  Craig Staggers, CPhT

## 2020-08-31 ENCOUNTER — Telehealth (HOSPITAL_COMMUNITY): Payer: Self-pay | Admitting: Pharmacy Technician

## 2020-08-31 ENCOUNTER — Encounter (HOSPITAL_COMMUNITY): Payer: Self-pay

## 2020-08-31 NOTE — Progress Notes (Unsigned)
Medication Samples have been provided to the patient.  Drug name: Marcelline Deist       Strength: 10 mg        Qty: 4  LOT: [id number]  Exp.Date: 01/18/2023  Dosing instructions: Take 1 tablet dsily  The patient has been instructed regarding the correct time, dose, and frequency of taking this medication, including desired effects and most common side effects.   Smitty Cords Zaniya Mcaulay 3:25 PM 08/31/2020

## 2020-08-31 NOTE — Progress Notes (Unsigned)
Medication Samples have been provided to the patient.  Drug name: Sherryll Burger       Strength: 49/51 mg        Qty: 1  LOT: [id number]  Exp.Date: 09/2022  Dosing instructions: Take 1 tablet Twice daily   The patient has been instructed regarding the correct time, dose, and frequency of taking this medication, including desired effects and most common side effects.   Smitty Cords Monet [place] 3:27 PM 08/31/2020

## 2020-08-31 NOTE — Telephone Encounter (Signed)
Advanced Heart Failure Patient Advocate Encounter  Spoke with the patient's wife. He is losing insurance and will be gaining new insurance on August 1. She thinks that they have enough of most of his medications to make it through until his new insurance kicks in. The only medications the patient needs would be [place].   Would provide samples in this instance instead of applying for assistance.   Waylan Boga (RN) a message for a month of samples.   Archer Asa, CPhT

## 2020-09-01 NOTE — Telephone Encounter (Signed)
All of this was handled yesterday and there are samples waiting at the check in desk for this patient. Thanks

## 2020-09-02 NOTE — Progress Notes (Signed)
Remote ICD transmission.   

## 2020-09-13 ENCOUNTER — Other Ambulatory Visit: Payer: Self-pay | Admitting: Legal Medicine

## 2020-09-13 DIAGNOSIS — J9601 Acute respiratory failure with hypoxia: Secondary | ICD-10-CM

## 2020-09-25 ENCOUNTER — Other Ambulatory Visit (HOSPITAL_COMMUNITY): Payer: Self-pay

## 2020-09-27 ENCOUNTER — Encounter (HOSPITAL_COMMUNITY): Payer: Self-pay | Admitting: Cardiology

## 2020-09-27 MED ORDER — ENTRESTO 49-51 MG PO TABS
1.0000 | ORAL_TABLET | Freq: Two times a day (BID) | ORAL | 0 refills | Status: DC
Start: 2020-09-27 — End: 2020-10-20

## 2020-09-27 NOTE — Telephone Encounter (Signed)
Medication Samples have been provided to the patient.  Drug name: farxiga       Strength: 10 mg        Qty: 14  LOT: [id number]  Exp.Date: 02/17/2023  Dosing instructions: one tab daily  The patient has been instructed regarding the correct time, dose, and frequency of taking this medication, including desired effects and most common side effects.   Theresia Bough 9:55 AM 09/27/2020

## 2020-09-28 ENCOUNTER — Telehealth (HOSPITAL_COMMUNITY): Payer: Self-pay | Admitting: Pharmacy Technician

## 2020-09-28 ENCOUNTER — Encounter (HOSPITAL_COMMUNITY): Payer: Self-pay | Admitting: Pharmacy Technician

## 2020-09-28 ENCOUNTER — Encounter (HOSPITAL_COMMUNITY): Payer: Self-pay

## 2020-09-28 NOTE — Telephone Encounter (Signed)
Advanced Heart Failure Patient Advocate Encounter  Received a message from the patient's wife requesting Entresto 49-51mg  samples.  Waylan Boga (RN) a request for a month of samples. Looks like [place] samples have been provided as well.  Archer Asa, CPhT

## 2020-09-28 NOTE — Progress Notes (Unsigned)
Medication Samples have been provided to the patient.  Drug name: Sherryll Burger       Strength: 49/51 mg        Qty: 1  LOT: [id number]  Exp.Date: 09/2022  Dosing instructions: Take 1 tablet Twice daily   The patient has been instructed regarding the correct time, dose, and frequency of taking this medication, including desired effects and most common side effects.   Brian Noble 4:17 PM 09/28/2020

## 2020-10-08 ENCOUNTER — Other Ambulatory Visit (HOSPITAL_COMMUNITY): Payer: Self-pay | Admitting: Cardiology

## 2020-10-18 ENCOUNTER — Other Ambulatory Visit (HOSPITAL_COMMUNITY): Payer: Self-pay

## 2020-10-20 ENCOUNTER — Other Ambulatory Visit: Payer: Self-pay

## 2020-10-20 ENCOUNTER — Other Ambulatory Visit (HOSPITAL_COMMUNITY): Payer: Self-pay

## 2020-10-20 ENCOUNTER — Telehealth (HOSPITAL_COMMUNITY): Payer: Self-pay | Admitting: Pharmacy Technician

## 2020-10-20 MED ORDER — ENTRESTO 49-51 MG PO TABS
1.0000 | ORAL_TABLET | Freq: Two times a day (BID) | ORAL | 0 refills | Status: DC
Start: 2020-10-20 — End: 2021-01-21

## 2020-10-20 MED ORDER — ENTRESTO 49-51 MG PO TABS
1.0000 | ORAL_TABLET | Freq: Two times a day (BID) | ORAL | 0 refills | Status: DC
Start: 2020-10-20 — End: 2020-10-29

## 2020-10-20 MED ORDER — EMPAGLIFLOZIN 10 MG PO TABS: 10.0000 mg | ORAL_TABLET | Freq: Every day | ORAL | 0 refills | Status: DC

## 2020-10-20 MED ORDER — EPLERENONE 25 MG PO TABS
25.0000 mg | ORAL_TABLET | Freq: Every day | ORAL | 0 refills | Status: DC
Start: 2020-10-20 — End: 2020-12-07

## 2020-10-20 NOTE — Telephone Encounter (Signed)
Patient Advocate Encounter   Received notification from [employer] that prior authorization for Sherryll Burger is required.   PA submitted on CoverMyMeds Key [facility] Status is pending   Will continue to follow.  Patient is currently taking [place]. His new [employer] prefers [place]. Would need to try and fail Jardiance before attempting to get a [place] PA approval.   Patient Advocate Encounter   Received notification from [employer] that prior authorization for [place] Pepper is required.   PA submitted on CoverMyMeds Key [id number] Status is pending   Will continue to follow.

## 2020-10-20 NOTE — Telephone Encounter (Signed)
Advanced Heart Failure Patient Advocate Encounter  Prior Authorization for Sherryll Burger has been approved.    PA# [id number] Effective dates: 10/20/20 through 10/20/21  Patients co-pay is $75 (insurance allows 30 day billing)  Advanced Heart Failure Patient Advocate Encounter  Prior Authorization for [place] Pepper has been approved.    PA# [id number] Effective dates: 10/20/20 through 10/20/21  Patients co-pay is $50   Called and spoke with the patient's wife. They are aware of the medication switch from [place] to [place], per insurance requirements. They already have a $10 co-pay card for [facility]. Emailed the patient's wife a copy of the [place] co-pay card to use. Should take Jardiance 30 day co-pay to $15.   She requested a few refills. Sent Philiica (CMA) a request for refills on Entresto, Eplerenone and to update the patient's med list to reflect Jardiance 10mg  while sending that RX as well. The patient's wife is aware that the patient would take what he has left of the and switch over to [place] the day after finishing Farxiga.   Jardiance co-pay card RxBIN: [id number]  RxPCN: [id number]  RxGRP: [id number]  ID: [id number]  [id number], CPhT

## 2020-10-20 NOTE — Telephone Encounter (Signed)
Pt's wife calling our office due to unsuccessful attempts to reach cardiology. Will send request to cardiologist office.   Lorita Officer, CCMA 10/20/20 11:22 AM

## 2020-10-25 ENCOUNTER — Ambulatory Visit: Payer: Self-pay | Admitting: Internal Medicine

## 2020-10-25 DIAGNOSIS — I428 Other cardiomyopathies: Secondary | ICD-10-CM

## 2020-10-25 DIAGNOSIS — I5022 Chronic systolic (congestive) heart failure: Secondary | ICD-10-CM

## 2020-10-25 DIAGNOSIS — Z95810 Presence of automatic (implantable) cardiac defibrillator: Secondary | ICD-10-CM

## 2020-10-25 DIAGNOSIS — I472 Ventricular tachycardia: Secondary | ICD-10-CM

## 2020-10-28 NOTE — Progress Notes (Signed)
Patient Care Team: [name], MD as PCP - General (Family Medicine) Laurey Morale, MD as PCP - Advanced Heart Failure (Cardiology) [name], MD as PCP - Electrophysiology (Cardiology)   HPI  Brian Noble is a 60 y.o. male Seen in follow-up for aborted cardiac arrest in the context of nonischemic cardiomyopathy and hypokalemia.  8/16>> ICD.  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  Egrams reviewed by Dr. Leonia Reeves who started him on mexiletine.\  tolerating mexiletine  The patient denies chest pain, shortness of breath, nocturnal dyspnea, orthopnea or peripheral edema.  There have been no palpitations, lightheadedness or syncope.    Interval ventricular tachycardia 2/22 cycle length approximately 300 ms terminated with ATP; occurred LSK bag also placed while preaching.  A recurring theme     Appropriate Therapy yes  8/20 VT/VFl (CL 220 msec>> failed ATP + shock---- dirty break with VT NS )   Yes 8/21 ; VT ATP 2/22  Inappropriate Therapy  none Antiarrhythmics Date   mexiletine  8/20            DATE TEST EF   11/16    Echo 20 %   12/17    Echo 30 %   10/19 Echo  20-25%   11/19 LHC/RHC  No Obs CAD Normal filling press  7/21 Echo  25-30%      Date Cr K Mg  12/17  1.17 5.0 2.1  8/20 1.27 4.0 2.0(5/20)    Records and Results Reviewed hosp records  Past Medical History:  Diagnosis Date   Arthritis    Chronic kidney disease    Chronic systolic CHF (congestive heart failure) (HCC)    a. Echo 8/16:  EF 10%, diff HK, Gr 1 DD, mild dilated aortic root, mild MR, mild LAE, severely reduced RVSF   Hypertension    ICD (implantable cardioverter-defibrillator) in place    NICM (nonischemic cardiomyopathy) (HCC)    a. LHC 8/16:  normal coronary arteries.    VF (ventricular fibrillation) (HCC)    s/p OOH cardiac arrest 8/16 >> s/p ICD    Past Surgical History:  Procedure Laterality Date    APPENDECTOMY     CARDIAC CATHETERIZATION N/A 11/10/2014   Procedure: Left Heart Cath and Coronary Angiography;  Surgeon: [name], MD;  Location: [facility] INVASIVE CV LAB;  Service: Cardiovascular;  Laterality: N/A;   EP IMPLANTABLE DEVICE N/A 11/16/2014   Procedure: ICD Implant;  Surgeon: [name], MD;  Location: [facility] INVASIVE CV LAB;  Service: Cardiovascular;  Laterality: N/A;   RIGHT HEART CATH AND CORONARY ANGIOGRAPHY N/A 01/30/2018   Procedure: RIGHT HEART CATH AND CORONARY ANGIOGRAPHY;  Surgeon: Laurey Morale, MD;  Location: [facility] INVASIVE CV LAB;  Service: Cardiovascular;  Laterality: N/A;   ULTRASOUND GUIDANCE FOR VASCULAR ACCESS  01/30/2018   Procedure: Ultrasound Guidance For Vascular Access;  Surgeon: Laurey Morale, MD;  Location: [facility] INVASIVE CV LAB;  Service: Cardiovascular;;    Current Outpatient Medications  Medication Sig Dispense Refill   albuterol (VENTOLIN HFA) 108 (90 Base) MCG/ACT inhaler Inhale 2 puffs into the lungs every 6 (six) hours as needed. 8.5 g 6   aspirin EC 81 MG tablet Take 81 mg by mouth daily.     colchicine 0.6 MG tablet TAKE ONE TABLET BY MOUTH ONCE DAILY AS NEEDED FOR FLARE UPS 15 tablet 0   empagliflozin (JARDIANCE) 10  MG TABS tablet Take 1 tablet (10 mg total) by mouth daily before breakfast. 30 tablet 0   eplerenone (INSPRA) 25 MG tablet Take 1 tablet (25 mg total) by mouth daily. Needs appt for further refills 30 tablet 0   lovastatin (MEVACOR) 20 MG tablet TAKE 1 TABLET BY MOUTH EVERY DAY 90 tablet 2   magnesium oxide (MAG-OX) 400 MG tablet Take 400 mg by mouth 2 (two) times a day.      mexiletine (MEXITIL) 200 MG capsule Take 1 capsule (200 mg total) by mouth 2 (two) times daily. 180 capsule 1   sacubitril-valsartan (ENTRESTO) 49-51 MG Take 1 tablet by mouth 2 (two) times daily. NEEDS APPOINTMENT FOR FUTURE REFILLS 60 tablet 0   sildenafil (REVATIO) 20 MG tablet TAKE 1/2 TABLET BY MOUTH DAILY AS NEEDED FOR ERECTILE DYSFUNCTION 10 tablet 6    carvedilol (COREG) 12.5 MG tablet Take 1.5 tablets (18.75 mg total) by mouth 2 (two) times daily. 270 tablet 3   No current facility-administered medications for this visit.    No Known Allergies    Review of Systems negative except from HPI and PMH  Physical Exam BP 122/80   Pulse 61   Ht 5\' 8"  (1.727 m)   Wt 198 lb 6.4 oz (90 kg)   SpO2 95%   BMI 30.17 kg/m  Well developed and well nourished in no acute distress HENT normal Neck supple with JVP-flat Clear Device pocket well healed; without hematoma or erythema.  There is no tethering  Regular rate and rhythm, no gallop No / murmur Abd-soft with active BS No Clubbing cyanosis  edema Skin-warm and dry A & Oriented  Grossly normal sensory and motor function  ECG sinus @ 61 23/15/41  Axis I 21 Q waves lead I and aVL V6  Assessment and  Plan  Aborted cardiac arrest  Ventricular tachycardia/flutter with appropriate therapy 8/20 8/21, 2/22  Ventricular tachycardia-nonsustained  Nonischemic cardiomyopathy  IVCD  High Risk Medication Surveillance-eplerenone  Implantable defibrillator-[employer]  The patient's device was interrogated.        Pt heart failure status is stable. Continue epleronone 25 mg.  Electrolytes are stable but need to be rechecked  Continues with nonsustained ventricular tachycardia and sustained ventricular tachycardia prompting ATP 2/22, occurred as have other occasions, while preaching.  No longer preaching.  Titrating instead.  Continue mexiletine 200 twice daily  Cardiomyopathy function stable.  Functional status class II.  Continue eplerenone, Jardiance 10, Entresto 49/51 and carvedilol 18.75 twice daily.    We will arrange for follow-up with heart failure clinic in about 6 months and we will see in 12 months

## 2020-10-29 ENCOUNTER — Encounter: Payer: Self-pay | Admitting: Internal Medicine

## 2020-10-29 ENCOUNTER — Other Ambulatory Visit: Payer: Self-pay

## 2020-10-29 ENCOUNTER — Ambulatory Visit: Payer: 59 | Admitting: Internal Medicine

## 2020-10-29 VITALS — BP 122/80 | HR 61 | Ht 68.0 in | Wt 198.4 lb

## 2020-10-29 DIAGNOSIS — Z95810 Presence of automatic (implantable) cardiac defibrillator: Secondary | ICD-10-CM | POA: Diagnosis not present

## 2020-10-29 DIAGNOSIS — I472 Ventricular tachycardia, unspecified: Secondary | ICD-10-CM

## 2020-10-29 DIAGNOSIS — I5022 Chronic systolic (congestive) heart failure: Secondary | ICD-10-CM | POA: Diagnosis not present

## 2020-10-29 DIAGNOSIS — I10 Essential (primary) hypertension: Secondary | ICD-10-CM | POA: Diagnosis not present

## 2020-10-29 LAB — BASIC METABOLIC PANEL
BUN/Creatinine Ratio: 11 (ref 10–24)
BUN: 13 mg/dL (ref 8–27)
CO2: 22 mmol/L (ref 20–29)
Calcium: 9.5 mg/dL (ref 8.6–10.2)
Chloride: 103 mmol/L (ref 96–106)
Creatinine, Ser: 1.16 mg/dL (ref 0.76–1.27)
Glucose: 88 mg/dL (ref 65–99)
Potassium: 4.6 mmol/L (ref 3.5–5.2)
Sodium: 140 mmol/L (ref 134–144)
eGFR: 72 mL/min/{1.73_m2} (ref >59)

## 2020-10-29 LAB — MAGNESIUM: Magnesium: 2.2 mg/dL (ref 1.6–2.3)

## 2020-10-29 NOTE — Patient Instructions (Signed)
Medication Instructions:  Your physician recommends that you continue on your current medications as directed. Please refer to the Current Medication list given to you today.  Labwork: You will have labs drawn today: BMP and Mg   Testing/Procedures: None ordered.  Follow-Up: Your physician recommends that you schedule a follow-up appointment in:  6 months with the Heart Failure Clinic  12 months with Dr. Graciela Husbands   Any Other Special Instructions Will Be Listed Below (If Applicable).     If you need a refill on your cardiac medications before your next appointment, please call your pharmacy.

## 2020-11-10 ENCOUNTER — Ambulatory Visit (INDEPENDENT_AMBULATORY_CARE_PROVIDER_SITE_OTHER): Payer: 59

## 2020-11-10 DIAGNOSIS — I428 Other cardiomyopathies: Secondary | ICD-10-CM

## 2020-11-10 LAB — CUP PACEART REMOTE DEVICE CHECK
Battery Remaining Longevity: 56 mo
Battery Voltage: 2.98 V
Brady Statistic RV Percent Paced: 0.03 %
Date Time Interrogation Session: 20220824001603
HighPow Impedance: 69 Ohm
Implantable Lead Implant Date: 20160829
Implantable Lead Location: 753860
Implantable Pulse Generator Implant Date: 20160829
Lead Channel Impedance Value: 380 Ohm
Lead Channel Impedance Value: 456 Ohm
Lead Channel Pacing Threshold Amplitude: 0.75 V
Lead Channel Pacing Threshold Pulse Width: 0.4 ms
Lead Channel Sensing Intrinsic Amplitude: 21.875 mV
Lead Channel Sensing Intrinsic Amplitude: 21.875 mV
Lead Channel Setting Pacing Amplitude: 2 V
Lead Channel Setting Pacing Pulse Width: 0.4 ms
Lead Channel Setting Sensing Sensitivity: 0.3 mV

## 2020-11-19 ENCOUNTER — Other Ambulatory Visit (HOSPITAL_COMMUNITY): Payer: Self-pay | Admitting: Cardiology

## 2020-11-25 NOTE — Progress Notes (Signed)
Remote ICD transmission.   

## 2020-12-04 ENCOUNTER — Other Ambulatory Visit (HOSPITAL_COMMUNITY): Payer: Self-pay | Admitting: Cardiology

## 2020-12-24 ENCOUNTER — Other Ambulatory Visit: Payer: Self-pay

## 2020-12-24 ENCOUNTER — Other Ambulatory Visit (HOSPITAL_COMMUNITY): Payer: Self-pay | Admitting: Cardiology

## 2020-12-24 ENCOUNTER — Other Ambulatory Visit (HOSPITAL_COMMUNITY): Payer: Self-pay

## 2020-12-24 MED ORDER — EMPAGLIFLOZIN 10 MG PO TABS
10.0000 mg | ORAL_TABLET | Freq: Every day | ORAL | 0 refills | Status: DC
Start: 2020-12-24 — End: 2021-03-18

## 2020-12-27 MED ORDER — COLCHICINE 0.6 MG PO TABS: 0.6000 mg | ORAL_TABLET | Freq: Every day | ORAL | 0 refills | Status: DC

## 2021-01-20 ENCOUNTER — Other Ambulatory Visit: Payer: Self-pay

## 2021-01-21 ENCOUNTER — Other Ambulatory Visit (HOSPITAL_COMMUNITY): Payer: Self-pay | Admitting: *Deleted

## 2021-01-21 MED ORDER — ENTRESTO 49-51 MG PO TABS
1.0000 | ORAL_TABLET | Freq: Two times a day (BID) | ORAL | 0 refills | Status: DC
Start: 2021-01-21 — End: 2021-02-18

## 2021-02-06 ENCOUNTER — Other Ambulatory Visit: Payer: Self-pay | Admitting: Legal Medicine

## 2021-02-06 DIAGNOSIS — F5221 Male erectile disorder: Secondary | ICD-10-CM

## 2021-02-09 ENCOUNTER — Ambulatory Visit (INDEPENDENT_AMBULATORY_CARE_PROVIDER_SITE_OTHER): Payer: 59

## 2021-02-09 DIAGNOSIS — I428 Other cardiomyopathies: Secondary | ICD-10-CM

## 2021-02-09 LAB — CUP PACEART REMOTE DEVICE CHECK
Battery Remaining Longevity: 61 mo
Battery Voltage: 2.98 V
Brady Statistic RV Percent Paced: 0.01 %
Date Time Interrogation Session: 20221123043824
HighPow Impedance: 73 Ohm
Implantable Lead Implant Date: 20160829
Implantable Lead Location: 753860
Implantable Pulse Generator Implant Date: 20160829
Lead Channel Impedance Value: 380 Ohm
Lead Channel Impedance Value: 456 Ohm
Lead Channel Pacing Threshold Amplitude: 0.625 V
Lead Channel Pacing Threshold Pulse Width: 0.4 ms
Lead Channel Sensing Intrinsic Amplitude: 22.75 mV
Lead Channel Sensing Intrinsic Amplitude: 22.75 mV
Lead Channel Setting Pacing Amplitude: 2 V
Lead Channel Setting Pacing Pulse Width: 0.4 ms
Lead Channel Setting Sensing Sensitivity: 0.3 mV

## 2021-02-18 ENCOUNTER — Ambulatory Visit (HOSPITAL_COMMUNITY)
Admission: RE | Admit: 2021-02-18 | Discharge: 2021-02-18 | Disposition: A | Discharge status: Home / Self Care | Payer: 59 | Source: Ambulatory Visit | Attending: Family Medicine | Admitting: Family Medicine

## 2021-02-18 ENCOUNTER — Encounter (HOSPITAL_COMMUNITY): Payer: Self-pay

## 2021-02-18 VITALS — BP 122/89 | HR 63 | Wt 206.6 lb

## 2021-02-18 DIAGNOSIS — I130 Hypertensive heart and chronic kidney disease with heart failure and stage 1 through stage 4 chronic kidney disease, or unspecified chronic kidney disease: Secondary | ICD-10-CM | POA: Diagnosis not present

## 2021-02-18 DIAGNOSIS — I5022 Chronic systolic (congestive) heart failure: Secondary | ICD-10-CM | POA: Insufficient documentation

## 2021-02-18 DIAGNOSIS — Z4502 Encounter for adjustment and management of automatic implantable cardiac defibrillator: Secondary | ICD-10-CM | POA: Insufficient documentation

## 2021-02-18 DIAGNOSIS — Z79899 Other long term (current) drug therapy: Secondary | ICD-10-CM | POA: Insufficient documentation

## 2021-02-18 DIAGNOSIS — R400 Somnolence: Secondary | ICD-10-CM

## 2021-02-18 DIAGNOSIS — N189 Chronic kidney disease, unspecified: Secondary | ICD-10-CM | POA: Insufficient documentation

## 2021-02-18 DIAGNOSIS — F5221 Male erectile disorder: Secondary | ICD-10-CM | POA: Diagnosis not present

## 2021-02-18 DIAGNOSIS — Z7984 Long term (current) use of oral hypoglycemic drugs: Secondary | ICD-10-CM | POA: Diagnosis not present

## 2021-02-18 DIAGNOSIS — Z8674 Personal history of sudden cardiac arrest: Secondary | ICD-10-CM | POA: Insufficient documentation

## 2021-02-18 DIAGNOSIS — I472 Ventricular tachycardia, unspecified: Secondary | ICD-10-CM | POA: Insufficient documentation

## 2021-02-18 DIAGNOSIS — Z8249 Family history of ischemic heart disease and other diseases of the circulatory system: Secondary | ICD-10-CM | POA: Insufficient documentation

## 2021-02-18 LAB — BASIC METABOLIC PANEL
Anion gap: 7 (ref 5–15)
BUN: 16 mg/dL (ref 6–20)
CO2: 24 mmol/L (ref 22–32)
Calcium: 9.5 mg/dL (ref 8.9–10.3)
Chloride: 104 mmol/L (ref 98–111)
Creatinine, Ser: 1.16 mg/dL (ref 0.61–1.24)
GFR, Estimated: >60 mL/min (ref >60)
Glucose, Bld: 105 mg/dL — ABNORMAL HIGH (ref 70–99)
Potassium: 4.7 mmol/L (ref 3.5–5.1)
Sodium: 135 mmol/L (ref 135–145)

## 2021-02-18 LAB — MAGNESIUM: Magnesium: 2.4 mg/dL (ref 1.7–2.4)

## 2021-02-18 LAB — BRAIN NATRIURETIC PEPTIDE: B Natriuretic Peptide: 184.9 pg/mL — ABNORMAL HIGH (ref 0.0–100.0)

## 2021-02-18 MED ORDER — SILDENAFIL CITRATE 20 MG PO TABS: 50.0000 mg | ORAL_TABLET | ORAL | 0 refills | Status: DC | PRN

## 2021-02-18 MED ORDER — ENTRESTO 97-103 MG PO TABS: 1.0000 | ORAL_TABLET | Freq: Two times a day (BID) | ORAL | 11 refills | Status: DC

## 2021-02-18 MED ORDER — FUROSEMIDE 20 MG PO TABS: 40.0000 mg | ORAL_TABLET | ORAL | 11 refills | Status: DC | PRN

## 2021-02-18 NOTE — Progress Notes (Signed)
Date:  02/18/2021   ID:  Brian Noble, DOB [birth year], MRN [id number]  Location: Home  Provider location: [facility] Type of Visit: Established patient   PCP:  [name], MD  Cardiologist:  None Primary HF: Dr [name] EP: Dr [name]   Chief Complaint: Heart Failure follow up  History of Present Illness: Brian Noble is a 60 y.o. male with a history of cardiomyopathy who developed a cardiac arrest and was found to have low EF and normal coronaries.  Per patient, he was found to have low EF around 20% about 8 years ago.  He was seen by a cardiologist in [place] (he thinks), and EF went back to normal range.  He had no problems up until 8/16.  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  He was noted to pass out by family.  EMS was called and arrived quickly.  He was in either ventricular fibrillation or VT and was defibrillated.  He was cooled and sent for coronary angiography, which showed no significant coronary disease.  Echo showed EF 10% with severe RV dysfunction.  After recovery, he got a Medtronic ICD.  Repeat echo in 11/16 showed EF 20% with septal-lateral dyssynchrony, moderately decreased RV systolic function.  Unable to take Bidil due to severe headaches.     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 Appropriate shock due to VT. ECHO completed and showed EF 20-25% and normal RV. Carvedilol increased to 18.75 mg twice a day. He was discharged on 12/20/17. He had follow up with Chanetta Marshall EP NP and carvedilol was increased to 25 mg twice a day.    RHC/LHC was done in 11/19, showing no significant CAD and optimized filling pressures with preserved CI 2.31. CPX in 11/19 showed deconditioning but no significant cardiac limitation.    Follow up with EP, had VT with ATP on 2/22, patient asymptomatic. Continued on mexiletine 200 mg bid.  Today he returns for HF follow up, he was last seen 12/21. Overall feeling fine. He does  not have significant dyspnea with activity. Denies CP, dizziness, edema, or PND/Orthopnea. Appetite ok. No fever or chills. Weight at home 209 pounds. Taking all medications. He works full time in [employer]. Has not needed PRN lasix recently. Drinks a lot of fluids during the day.  Device interrogation (personally reviewed): OptiVol elevated, climbing for past month, no VT/AF, 5 hrs day/activity,    Past Medical History:  Diagnosis Date   Arthritis    Chronic kidney disease    Chronic systolic CHF (congestive heart failure) ([facility])    a. Echo 8/16:  EF 10%, diff HK, Gr 1 DD, mild dilated aortic root, mild MR, mild LAE, severely reduced RVSF   Hypertension    ICD (implantable cardioverter-defibrillator) in place    NICM (nonischemic cardiomyopathy) ([place])    a. LHC 8/16:  normal coronary arteries.    VF (ventricular fibrillation) ([facility])    s/p OOH cardiac arrest 8/16 >> s/p ICD   Past Surgical History:  Procedure Laterality Date   APPENDECTOMY     CARDIAC CATHETERIZATION N/A 11/10/2014   Procedure: Left Heart Cath and Coronary Angiography;  Surgeon: Lorretta Harp, MD;  Location: [facility];  Service: Cardiovascular;  Laterality: N/A;   EP IMPLANTABLE DEVICE N/A 11/16/2014   Procedure: ICD Implant;  Surgeon: Deboraha Sprang, MD;  Location: [facility];  Service: Cardiovascular;  Laterality: N/A;  RIGHT HEART CATH AND CORONARY ANGIOGRAPHY N/A 01/30/2018   Procedure: RIGHT HEART CATH AND CORONARY ANGIOGRAPHY;  Surgeon: Laurey Morale, MD;  Location: [facility] INVASIVE CV LAB;  Service: Cardiovascular;  Laterality: N/A;   ULTRASOUND GUIDANCE FOR VASCULAR ACCESS  01/30/2018   Procedure: Ultrasound Guidance For Vascular Access;  Surgeon: Laurey Morale, MD;  Location: [facility] INVASIVE CV LAB;  Service: Cardiovascular;;   Current Outpatient Medications  Medication Sig Dispense Refill   albuterol (VENTOLIN HFA) 108 (90 Base) MCG/ACT inhaler Inhale 2 puffs into the lungs every  6 (six) hours as needed. 8.5 g 6   aspirin EC 81 MG tablet Take 81 mg by mouth daily.     carvedilol (COREG) 12.5 MG tablet Take 1.5 tablets (18.75 mg total) by mouth 2 (two) times daily. 270 tablet 3   colchicine 0.6 MG tablet Take 0.6 mg by mouth as needed.     empagliflozin (JARDIANCE) 10 MG TABS tablet Take 1 tablet (10 mg total) by mouth daily. Need appointment for further refills 30 tablet 0   eplerenone (INSPRA) 25 MG tablet Take 1 tablet (25 mg total) by mouth daily. Absolute last refill without office visit please call [phone] 30 tablet 0   lovastatin (MEVACOR) 20 MG tablet TAKE 1 TABLET BY MOUTH EVERY DAY 90 tablet 2   magnesium oxide (MAG-OX) 400 MG tablet Take 400 mg by mouth 2 (two) times a day.      mexiletine (MEXITIL) 200 MG capsule Take 1 capsule (200 mg total) by mouth 2 (two) times daily. 180 capsule 1   sacubitril-valsartan (ENTRESTO) 49-51 MG Take 1 tablet by mouth 2 (two) times daily. NEEDS APPOINTMENT FOR FUTURE REFILLS 60 tablet 0   sildenafil (REVATIO) 20 MG tablet TAKE 1/2 TABLET BY MOUTH DAILY AS NEEDED FOR ERECTILE DYSFUNCTION 10 tablet 6   No current facility-administered medications for this encounter.    Allergies:   Patient has no known allergies.   Social History:  The patient  reports that he has never smoked. He quit smokeless tobacco use about 22 years ago.  His smokeless tobacco use included chew. He reports that he does not drink alcohol and does not use drugs.   Family History:  The patient's family history includes Heart disease in his sister and sister; Hypertension in his brother, brother, brother, brother, father, mother, sister, sister, and sister; Sudden death in his father.   ROS:  Please see the history of present illness.   All other systems are personally reviewed and negative.   Recent Labs: 07/15/2020: Hemoglobin 16.2; Platelets 233 07/30/2020: ALT 39 10/29/2020: BUN 13; Creatinine, Ser 1.16; Magnesium 2.2; Potassium 4.6; Sodium 140   Personally reviewed   BP 122/89   Pulse 63   Wt 93.7 kg (206 lb 9.6 oz)   SpO2 97%   BMI 31.41 kg/m   Wt Readings from Last 3 Encounters:  02/18/21 93.7 kg (206 lb 9.6 oz)  10/29/20 90 kg (198 lb 6.4 oz)  07/15/20 92.5 kg (204 lb)   Physical Exam: General:  NAD. No resp difficulty HEENT: Normal Neck: Supple. JVP 7-8. Carotids 2+ bilat; no bruits. No lymphadenopathy or thryomegaly appreciated. Cor: PMI nondisplaced. Regular rate & rhythm. No rubs, gallops or murmurs. Lungs: Clear Abdomen: Soft, nontender, nondistended. No hepatosplenomegaly. No bruits or masses. Good bowel sounds. Extremities: No cyanosis, clubbing, rash, edema Neuro: Alert & oriented x 3, cranial nerves grossly intact. Moves all 4 extremities w/o difficulty. Affect pleasant.  ASSESSMENT AND PLAN:  1. Chronic systolic  CHF: Nonischemic cardiomyopathy.  Medtronic ICD.  Possibly familial CMP given father with SCD at 23, sister with heart transplant, and 2 other sisters with "heart problems."  Cannot rule out prior viral myocarditis or role for HTN. Echo 12/17 with EF 30%, septal-lateral dyssynchrony.  CPX in 3/17 with mild functional impairment. Echo in 10/19 with EF 20-25%.  CPX repeat 01/2018 with submaximal effort but minimal cardiac impairment, suggestive of deconditioning.  [place] in 11/19 showed optimized filling pressures with preserved cardiac index.  NYHA II. Volume status mildly elevated on exam and by OptiVol. - Increase Entresto to 97/103 mg bid. (OK to back down to 49/51 if he does not tolerate this, but would add Lasix 40 daily if so). BMET/BNP today, repeat BMET in 10 days. - Continue Coreg 18.75 mg bid.  (Intolerant 25 mg twice a day due to profound fatigue).  - Continue eplerenone 25 mg daily.  - Continue Jardiance 10 mg daily. - Unable to tolerate Bidil due to headaches.  - Will have device RN send transmission in 1 week to follow fluid. - Given possible familial CMP, will need to talk to Mr Posthuma at  next appointment about [facility] genetic testing.  2. VT: Has Medtronic ICD.  VT with syncope in 10/19.  VT with ATP 2/22, he was asymptomatic with this. He is followed by Dr. [name]. - Continue mexiletine 200 mg bid. Check Mag today. 3 Suspect sleep apnea: Daytime fatigue and drowsiness. He tells me he has completed home sleep study. Will follow up. If not, will need to repeat home sleep study. 4. ED: Asking for increase in sildenafil. OK to increase to 50 mg prn. Further refills or dosage changes will need to come from PCP.  Follow up 3 months with Dr [name].   Frankey Poot, FNP  02/18/2021 8:47 AM  [employer] [address] [facility] [place] [address] [phone] (office) [phone] (fax)

## 2021-02-18 NOTE — Patient Instructions (Addendum)
INCREASE Entresto to 97/103 mg, one tab twice a day START Lasix 40 mg as needed for weight gain or swelling INCREASE Sildenafil to 50 mg as needed ( additional refills will need to come from PCP)  (Be sure to call triage with an update. If you start to feel bad with the increased dose of entresto, we will decrease back down to 49/51 mg twice a day and have you take lasix 40 mg daily.)   Labs today We will only contact you if something comes back abnormal or we need to make some changes. Otherwise no news is good news!  Lab needed in 7-10 days   Your physician recommends that you schedule a follow-up appointment in: 3 months with Dr Shirlee Latch  Do the following things EVERYDAY: Weigh yourself in the morning before breakfast. Write it down and keep it in a log. Take your medicines as prescribed Eat low salt foods--Limit salt (sodium) to 2000 mg per day.  Stay as active as you can everyday Limit all fluids for the day to less than 2 liters (68 ounces)  At the [facility], you and your health needs are our priority. As part of our continuing mission to provide you with exceptional heart care, we have created designated Provider Care Teams. These Care Teams include your primary Cardiologist (physician) and Advanced Practice Providers (APPs- Physician Assistants and Nurse Practitioners) who all work together to provide you with the care you need, when you need it.   You may see any of the following providers on your designated Care Team at your next follow up: Dr Arvilla Meres Dr Carron Curie, NP Robbie Lis, [place] [facility] [place], [place] Karle Plumber, PharmD   Please be sure to bring in all your medications bottles to every appointment.   If you have any questions or concerns before your next appointment please send [place] a message through [place] or call our office at [phone].    TO LEAVE A MESSAGE FOR THE NURSE SELECT OPTION 2, PLEASE  LEAVE A MESSAGE INCLUDING: YOUR NAME DATE OF BIRTH CALL BACK NUMBER REASON FOR CALL**this is important as we prioritize the call backs  YOU WILL RECEIVE A CALL BACK THE SAME DAY AS LONG AS YOU CALL BEFORE 4:00 PM

## 2021-02-21 NOTE — Progress Notes (Signed)
Remote ICD transmission.   

## 2021-02-22 ENCOUNTER — Telehealth (HOSPITAL_COMMUNITY): Payer: Self-pay | Admitting: Surgery

## 2021-02-22 NOTE — Telephone Encounter (Signed)
  Date:  02/22/2021 STOP BANG RISK ASSESSMENT S (snore) Have you been told that you snore?     YES   T (tired) Are you often tired, fatigued, or sleepy during the day?   YES  O (obstruction) Do you stop breathing, choke, or gasp during sleep? NO   P (pressure) Do you have or are you being treated for high blood pressure? YES   B (BMI) Is your body index greater than 35 kg/m? NO   A (age) Are you 60 years old or older? YES   N (neck) Do you have a neck circumference greater than 16 inches?   NO   G (gender) Are you a male? YES   TOTAL STOP/BANG "YES" ANSWERS 5                                                                       For Office Use Only              Procedure Order Form    YES to 3+ Stop Bang questions OR two clinical symptoms - patient qualifies for WatchPAT (CPT 95800)

## 2021-02-25 ENCOUNTER — Other Ambulatory Visit (HOSPITAL_COMMUNITY): Payer: 59

## 2021-03-02 ENCOUNTER — Ambulatory Visit (INDEPENDENT_AMBULATORY_CARE_PROVIDER_SITE_OTHER): Payer: 59

## 2021-03-02 DIAGNOSIS — I5022 Chronic systolic (congestive) heart failure: Secondary | ICD-10-CM

## 2021-03-02 DIAGNOSIS — Z95810 Presence of automatic (implantable) cardiac defibrillator: Secondary | ICD-10-CM | POA: Diagnosis not present

## 2021-03-02 NOTE — Progress Notes (Signed)
EPIC Encounter for ICM Monitoring  Patient Name: Brian Noble is a 60 y.o. male Date: 03/02/2021 Primary Care Physican: [name], MD Primary Cardiologist: Shirlee Latch Electrophysiologist: Graciela Husbands 02/18/2021 Office Weight: 206 lbs        One time ICM check per Prince Rome NP at [facility] as requested.  Transmission reviewed.   Optivol thoracic impedance returned to baseline normal but was suggesting possible fluid accumulation from 10/26 - 12/13 with a few days during that time at baseline normal.   Prescribed:  Furosemide 20 mg Take 2 tablets (40 mg total) by mouth as needed for fluid or edema.  Recommendations: Copy sent to Prince Rome, NP at [facility] for review.   Follow-up plan:  No further ICM clinic phone appointments.   91 day device clinic remote transmission 05/11/2021.    EP/Cardiology Office Visits: Recall 05/09/2021 with Dr Shirlee Latch.  Recall 10/29/2021 with Dr Graciela Husbands.  Copy of ICM check sent to Dr. Graciela Husbands.   3 month ICM trend: 03/02/2021.    12-14 Month ICM trend:       Karie Soda, RN 03/02/2021 7:49 AM

## 2021-03-04 ENCOUNTER — Ambulatory Visit (HOSPITAL_COMMUNITY)
Admission: RE | Admit: 2021-03-04 | Discharge: 2021-03-04 | Disposition: A | Discharge status: Home / Self Care | Payer: 59 | Source: Ambulatory Visit | Attending: Internal Medicine | Admitting: Internal Medicine

## 2021-03-04 ENCOUNTER — Telehealth (HOSPITAL_COMMUNITY): Payer: Self-pay | Admitting: Surgery

## 2021-03-04 ENCOUNTER — Other Ambulatory Visit: Payer: Self-pay

## 2021-03-04 DIAGNOSIS — I5022 Chronic systolic (congestive) heart failure: Secondary | ICD-10-CM | POA: Insufficient documentation

## 2021-03-04 LAB — BASIC METABOLIC PANEL
Anion gap: 7 (ref 5–15)
BUN: 22 mg/dL — ABNORMAL HIGH (ref 6–20)
CO2: 25 mmol/L (ref 22–32)
Calcium: 9.2 mg/dL (ref 8.9–10.3)
Chloride: 105 mmol/L (ref 98–111)
Creatinine, Ser: 1.36 mg/dL — ABNORMAL HIGH (ref 0.61–1.24)
GFR, Estimated: 60 mL/min — ABNORMAL LOW (ref >60)
Glucose, Bld: 101 mg/dL — ABNORMAL HIGH (ref 70–99)
Potassium: 4.7 mmol/L (ref 3.5–5.1)
Sodium: 137 mmol/L (ref 135–145)

## 2021-03-04 NOTE — Telephone Encounter (Signed)
I called to remind patient to proceed with completing ordered home sleep study.  I left a message and asked that he call back with any concerns or questions.

## 2021-03-05 ENCOUNTER — Encounter (INDEPENDENT_AMBULATORY_CARE_PROVIDER_SITE_OTHER): Payer: 59 | Admitting: Cardiology

## 2021-03-05 DIAGNOSIS — G4733 Obstructive sleep apnea (adult) (pediatric): Secondary | ICD-10-CM

## 2021-03-05 DIAGNOSIS — G4734 Idiopathic sleep related nonobstructive alveolar hypoventilation: Secondary | ICD-10-CM

## 2021-03-07 ENCOUNTER — Telehealth (HOSPITAL_COMMUNITY): Payer: Self-pay | Admitting: Surgery

## 2021-03-07 ENCOUNTER — Other Ambulatory Visit (HOSPITAL_COMMUNITY): Payer: Self-pay | Admitting: Family Medicine

## 2021-03-07 DIAGNOSIS — I5022 Chronic systolic (congestive) heart failure: Secondary | ICD-10-CM

## 2021-03-07 NOTE — Telephone Encounter (Signed)
-----   Message from Jacklynn Ganong, [place] sent at 03/04/2021  3:31 PM EST ----- SCr mildly elevated. Repeat BMET in 3 weeks

## 2021-03-07 NOTE — Telephone Encounter (Signed)
I attempted to reach patient regarding labwork and recommendations per provider.  I left a message for a return call.

## 2021-03-08 ENCOUNTER — Encounter (HOSPITAL_COMMUNITY): Payer: Self-pay

## 2021-03-08 NOTE — Procedures (Addendum)
° °  Sleep Study Report  Patient Information Study Date: 03/05/21 Patient Name: Brian Noble Patient ID: [id number] Birth Date: Feb 06, 2061 Age: 60 Gender: Male Referring Physician: Prince Rome, NP  TEST DESCRIPTION: Home sleep apnea testing was completed using the WatchPat, a Type 1 device, utilizing peripheral arterial tonometry (PAT), chest movement, actigraphy, pulse oximetry, pulse rate, body position and snore. AHI was calculated with apnea and hypopnea using valid sleep time as the denominator. RDI includes apneas, hypopneas, and RERAs. The data acquired and the scoring of sleep and all associated events were performed in accordance with the recommended standards and specifications as outlined in the AASM Manual for the Scoring of Sleep and Associated Events 2.2.0 (2015).  FINDINGS: 1. Severe Obstructive Sleep Apnea with AHI 57.5/hr. 2. Moderate Central Sleep Apnea with pAHIc 22.6/hr with 49.4% Cheyne Stokes Respirations 3. Oxygen desaturations as low as 54%. 4. Moderate snoring was present. O2 sats were < 88% for 44.7 min. 5. Total sleep time was 5 hrs and 16 min. 6. 19.2% of total sleep time was spent in REM sleep. 7. Shortened sleep onset latency at 5 min. 8. Prolonged REM sleep onset latency at 211 min. 9. Total awakenings were 12 .  DIAGNOSIS: Severe Obstructive Sleep Apnea (G47.33) Nocturnal Hypoxemia  RECOMMENDATIONS: 1. Clinical correlation of these findings is necessary. The decision to treat obstructive sleep apnea (OSA) is usually based on the presence of apnea symptoms or the presence of associated medical conditions such as Hypertension, Congestive Heart Failure, Atrial Fibrillation or Obesity. The most common symptoms of OSA are snoring, gasping for breath while sleeping, daytime sleepiness and fatigue.  2. Initiating apnea therapy is recommended given the presence of symptoms and/or associated conditions. Recommend proceeding with one of the  following:   a. Auto-CPAP therapy with a pressure range of 5-20cm H2O.   b. An oral appliance (OA) that can be obtained from certain dentists with expertise in sleep medicine. These are primarily of use in non-obese patients with mild and moderate disease.   c. An ENT consultation which may be useful to look for specific causes of obstruction and possible treatment options.   d. If patient is intolerant to PAP therapy, consider referral to ENT for evaluation for hypoglossal nerve stimulator.  3. Close follow-up is necessary to ensure success with CPAP or oral appliance therapy for maximum benefit .  4. A follow-up oximetry study on CPAP is recommended to assess the adequacy of therapy and determine the need for supplemental oxygen or the potential need for Bi-level therapy. An arterial blood gas to determine the adequacy of baseline ventilation and oxygenation should also be considered.  5. Healthy sleep recommendations include: adequate nightly sleep (normal 7-9 hrs/night), avoidance of caffeine after noon and alcohol near bedtime, and maintaining a sleep environment that is cool, dark and quiet.  6. Weight loss for overweight patients is recommended. Even modest amounts of weight loss can significantly improve the severity of sleep apnea.  7. Snoring recommendations include: weight loss where appropriate, side sleeping, and avoidance of alcohol before bed.  8. Operation of motor vehicle should be avoided when sleepy.  Signature: Electronically Signed: 03/09/21 Armanda Magic, MD; [facility]; Diplomat, American Board of Sleep Medicine

## 2021-03-11 ENCOUNTER — Other Ambulatory Visit: Payer: Self-pay | Admitting: Cardiology

## 2021-03-11 ENCOUNTER — Encounter (HOSPITAL_COMMUNITY): Payer: Self-pay | Admitting: Cardiology

## 2021-03-11 ENCOUNTER — Telehealth: Payer: Self-pay | Admitting: *Deleted

## 2021-03-11 DIAGNOSIS — G4733 Obstructive sleep apnea (adult) (pediatric): Secondary | ICD-10-CM

## 2021-03-11 NOTE — Telephone Encounter (Signed)
-----   Message from Lattie Haw, RN sent at 03/09/2021 10:58 AM EST -----  ----- Message ----- From: Quintella Reichert, MD Sent: 03/08/2021   7:32 PM EST To: Reesa Chew, CMA  Please let patient know that they have sleep apnea.  Recommend therapeutic CPAP titration ASAP for treatment of patient's SEVERE sleep disordered breathing.  If unable to perform an in lab titration then initiate ResMed auto CPAP from 4 to 15cm H2O with heated humidity and mask of choice and overnight pulse ox on CPAP.

## 2021-03-11 NOTE — Telephone Encounter (Signed)
Patient informed of Brian Noble results and recommendations. He agrees to proceed with CPAP titration. Prior Authorization for CPAP titration sent to [facility] via web portal. Tracking Number [id number].

## 2021-03-15 ENCOUNTER — Other Ambulatory Visit (HOSPITAL_COMMUNITY): Payer: Self-pay

## 2021-03-15 MED ORDER — EPLERENONE 25 MG PO TABS
25.0000 mg | ORAL_TABLET | Freq: Every day | ORAL | 11 refills | Status: DC
Start: 2021-03-15 — End: 2021-03-25

## 2021-03-15 NOTE — Progress Notes (Signed)
Cardiology sent the order for CPAP.

## 2021-03-16 ENCOUNTER — Other Ambulatory Visit: Payer: Self-pay | Admitting: Internal Medicine

## 2021-03-16 DIAGNOSIS — I472 Ventricular tachycardia, unspecified: Secondary | ICD-10-CM

## 2021-03-18 ENCOUNTER — Other Ambulatory Visit (HOSPITAL_COMMUNITY): Payer: Self-pay

## 2021-03-18 MED ORDER — EMPAGLIFLOZIN 10 MG PO TABS: 10.0000 mg | ORAL_TABLET | Freq: Every day | ORAL | 6 refills | Status: DC

## 2021-03-25 ENCOUNTER — Telehealth (HOSPITAL_COMMUNITY): Payer: Self-pay | Admitting: Cardiology

## 2021-03-25 ENCOUNTER — Ambulatory Visit (HOSPITAL_COMMUNITY)
Admission: RE | Admit: 2021-03-25 | Discharge: 2021-03-25 | Disposition: A | Discharge status: Home / Self Care | Payer: 59 | Source: Ambulatory Visit | Attending: Internal Medicine | Admitting: Internal Medicine

## 2021-03-25 ENCOUNTER — Other Ambulatory Visit: Payer: Self-pay

## 2021-03-25 DIAGNOSIS — I5022 Chronic systolic (congestive) heart failure: Secondary | ICD-10-CM | POA: Insufficient documentation

## 2021-03-25 LAB — BASIC METABOLIC PANEL
Anion gap: 7 (ref 5–15)
BUN: 20 mg/dL (ref 6–20)
CO2: 26 mmol/L (ref 22–32)
Calcium: 9.3 mg/dL (ref 8.9–10.3)
Chloride: 102 mmol/L (ref 98–111)
Creatinine, Ser: 1.33 mg/dL — ABNORMAL HIGH (ref 0.61–1.24)
GFR, Estimated: >60 mL/min (ref >60)
Glucose, Bld: 98 mg/dL (ref 70–99)
Potassium: 5.7 mmol/L — ABNORMAL HIGH (ref 3.5–5.1)
Sodium: 135 mmol/L (ref 135–145)

## 2021-03-25 MED ORDER — FUROSEMIDE 20 MG PO TABS: 40.0000 mg | ORAL_TABLET | ORAL | 11 refills | Status: DC | PRN

## 2021-03-25 NOTE — Telephone Encounter (Signed)
-----   Message from Jacklynn Ganong, [place] sent at 03/25/2021 10:54 AM EST ----- Potassium is too high. Stop eplerenone. Restart Lasix 40 mg daily. Please make sure he is not drinking sports drinks/Gatorade or taking any K suppl.   Repeat BMET next Wednesday 03/30/21

## 2021-03-25 NOTE — Telephone Encounter (Signed)
Patient called.  Patient aware via wife and voiced understanding

## 2021-03-28 ENCOUNTER — Encounter (HOSPITAL_COMMUNITY): Payer: Self-pay | Admitting: Cardiology

## 2021-03-30 ENCOUNTER — Ambulatory Visit (HOSPITAL_COMMUNITY)
Admission: RE | Admit: 2021-03-30 | Discharge: 2021-03-30 | Disposition: A | Discharge status: Home / Self Care | Payer: 59 | Source: Ambulatory Visit | Attending: Cardiology | Admitting: Cardiology

## 2021-03-30 ENCOUNTER — Other Ambulatory Visit: Payer: Self-pay

## 2021-03-30 DIAGNOSIS — I5022 Chronic systolic (congestive) heart failure: Secondary | ICD-10-CM

## 2021-03-30 LAB — BASIC METABOLIC PANEL
Anion gap: 10 (ref 5–15)
BUN: 20 mg/dL (ref 6–20)
CO2: 24 mmol/L (ref 22–32)
Calcium: 9.1 mg/dL (ref 8.9–10.3)
Chloride: 98 mmol/L (ref 98–111)
Creatinine, Ser: 1.41 mg/dL — ABNORMAL HIGH (ref 0.61–1.24)
GFR, Estimated: 57 mL/min — ABNORMAL LOW (ref >60)
Glucose, Bld: 95 mg/dL (ref 70–99)
Potassium: 5.3 mmol/L — ABNORMAL HIGH (ref 3.5–5.1)
Sodium: 132 mmol/L — ABNORMAL LOW (ref 135–145)

## 2021-03-30 NOTE — Addendum Note (Signed)
Encounter addended by: Warden Buffa M, CMA on: 03/30/2021 9:34 AM ° Actions taken: Charge Capture section accepted

## 2021-03-31 ENCOUNTER — Telehealth (HOSPITAL_COMMUNITY): Payer: Self-pay | Admitting: Cardiology

## 2021-03-31 DIAGNOSIS — I5022 Chronic systolic (congestive) heart failure: Secondary | ICD-10-CM

## 2021-03-31 NOTE — Telephone Encounter (Signed)
Patient called.  Patient aware via wife Repeat labs 04/08/21   Medication Samples have been provided to the patient.  Drug name: lokelma       Strength: 10g        Qty: 3  LOT: [id number]  Exp.Date: 05/10/21  Dosing instructions: dissolve one packet in 8-10 ounces and drink  The patient has been instructed regarding the correct time, dose, and frequency of taking this medication, including desired effects and most common side effects.   Magda Bernheim M 11:02 AM 03/31/2021

## 2021-03-31 NOTE — Telephone Encounter (Signed)
-----   Message from Jacklynn Ganong, [place] sent at 03/30/2021  1:35 PM EST ----- K remains elevated. Has he stopped his eplerenone? If so, please take Lokelma 10 g x 1 dose. Limit K-rich foods, Repeat BMET in 7-10 days.

## 2021-04-01 ENCOUNTER — Other Ambulatory Visit (HOSPITAL_COMMUNITY): Payer: Self-pay | Admitting: *Deleted

## 2021-04-01 MED ORDER — LOKELMA 10 G PO PACK: 10.0000 g | PACK | Freq: Every day | ORAL | 0 refills | Status: DC

## 2021-04-05 ENCOUNTER — Telehealth (HOSPITAL_COMMUNITY): Payer: Self-pay | Admitting: Pharmacist

## 2021-04-05 ENCOUNTER — Other Ambulatory Visit (HOSPITAL_COMMUNITY): Payer: Self-pay

## 2021-04-05 ENCOUNTER — Other Ambulatory Visit: Payer: Self-pay | Admitting: Cardiology

## 2021-04-05 NOTE — Telephone Encounter (Signed)
Advanced Heart Failure Patient Advocate Encounter  Prior Authorization for Brian Noble has been approved.    PA# [id number] Effective dates: 04/05/21 through 04/05/22  Audry Riles, PharmD, BCPS, BCCP, CPP Heart Failure Clinic Pharmacist [phone]

## 2021-04-05 NOTE — Telephone Encounter (Signed)
Patient Advocate Encounter   Received notification from [employer] that prior authorization for [facility] is required.   PA submitted on CoverMyMeds Key [id number] Status is pending   Will continue to follow.   Karle Plumber, PharmD, BCPS, BCCP, CPP Heart Failure Clinic Pharmacist [phone]

## 2021-04-06 ENCOUNTER — Other Ambulatory Visit (HOSPITAL_COMMUNITY): Payer: Self-pay

## 2021-04-06 NOTE — Telephone Encounter (Signed)
Advanced Heart Failure Patient Advocate Encounter  30 day copay, $100. Sent patient's wife copy of the copay card for [facility]. Should take copay to $0.  Charlann Boxer, CPhT

## 2021-04-07 ENCOUNTER — Ambulatory Visit: Payer: 59

## 2021-04-07 DIAGNOSIS — G4733 Obstructive sleep apnea (adult) (pediatric): Secondary | ICD-10-CM

## 2021-04-08 ENCOUNTER — Ambulatory Visit (HOSPITAL_COMMUNITY)
Admission: RE | Admit: 2021-04-08 | Discharge: 2021-04-08 | Disposition: A | Discharge status: Home / Self Care | Payer: 59 | Source: Ambulatory Visit | Attending: Cardiology | Admitting: Cardiology

## 2021-04-08 ENCOUNTER — Other Ambulatory Visit: Payer: Self-pay

## 2021-04-08 ENCOUNTER — Telehealth: Payer: Self-pay | Admitting: *Deleted

## 2021-04-08 DIAGNOSIS — I5022 Chronic systolic (congestive) heart failure: Secondary | ICD-10-CM | POA: Diagnosis not present

## 2021-04-08 DIAGNOSIS — G4733 Obstructive sleep apnea (adult) (pediatric): Secondary | ICD-10-CM

## 2021-04-08 LAB — BASIC METABOLIC PANEL
Anion gap: 8 (ref 5–15)
BUN: 15 mg/dL (ref 8–23)
CO2: 23 mmol/L (ref 22–32)
Calcium: 9.3 mg/dL (ref 8.9–10.3)
Chloride: 107 mmol/L (ref 98–111)
Creatinine, Ser: 1.35 mg/dL — ABNORMAL HIGH (ref 0.61–1.24)
GFR, Estimated: 60 mL/min — ABNORMAL LOW (ref >60)
Glucose, Bld: 89 mg/dL (ref 70–99)
Potassium: 4.6 mmol/L (ref 3.5–5.1)
Sodium: 138 mmol/L (ref 135–145)

## 2021-04-08 NOTE — Telephone Encounter (Signed)
left Message with wife to have patient to return a call for CPAP titration appointment details.

## 2021-04-08 NOTE — Telephone Encounter (Signed)
Prior Authorization for CPAP titration sent to [facility] via web portal. Tracking Number [id number].

## 2021-04-10 ENCOUNTER — Encounter (HOSPITAL_BASED_OUTPATIENT_CLINIC_OR_DEPARTMENT_OTHER): Payer: 59 | Admitting: Cardiology

## 2021-04-11 NOTE — Telephone Encounter (Signed)
Received a call from [facility] approving CPAP titration. Auth # [id number]. Valid dates 04/08/21 to 07/08/21.  Left patient a message with appointment details. Also recommended that he checks mychart.

## 2021-04-19 ENCOUNTER — Encounter: Payer: Self-pay | Admitting: Legal Medicine

## 2021-04-26 ENCOUNTER — Encounter (HOSPITAL_BASED_OUTPATIENT_CLINIC_OR_DEPARTMENT_OTHER): Payer: 59 | Admitting: Cardiology

## 2021-05-10 ENCOUNTER — Encounter (HOSPITAL_BASED_OUTPATIENT_CLINIC_OR_DEPARTMENT_OTHER): Payer: 59 | Admitting: Cardiology

## 2021-05-11 ENCOUNTER — Ambulatory Visit (INDEPENDENT_AMBULATORY_CARE_PROVIDER_SITE_OTHER): Payer: 59

## 2021-05-11 DIAGNOSIS — I428 Other cardiomyopathies: Secondary | ICD-10-CM | POA: Diagnosis not present

## 2021-05-12 LAB — CUP PACEART REMOTE DEVICE CHECK
Battery Remaining Longevity: 57 mo
Battery Voltage: 2.97 V
Brady Statistic RV Percent Paced: 0.01 %
Date Time Interrogation Session: 20230222213627
HighPow Impedance: 77 Ohm
Implantable Lead Implant Date: 20160829
Implantable Lead Location: 753860
Implantable Pulse Generator Implant Date: 20160829
Lead Channel Impedance Value: 399 Ohm
Lead Channel Impedance Value: 494 Ohm
Lead Channel Pacing Threshold Amplitude: 0.625 V
Lead Channel Pacing Threshold Pulse Width: 0.4 ms
Lead Channel Sensing Intrinsic Amplitude: 23.625 mV
Lead Channel Sensing Intrinsic Amplitude: 23.625 mV
Lead Channel Setting Pacing Amplitude: 2 V
Lead Channel Setting Pacing Pulse Width: 0.4 ms
Lead Channel Setting Sensing Sensitivity: 0.3 mV

## 2021-05-15 ENCOUNTER — Other Ambulatory Visit: Payer: Self-pay | Admitting: Legal Medicine

## 2021-05-15 DIAGNOSIS — E782 Mixed hyperlipidemia: Secondary | ICD-10-CM

## 2021-05-17 ENCOUNTER — Other Ambulatory Visit: Payer: Self-pay | Admitting: Student

## 2021-05-17 DIAGNOSIS — I472 Ventricular tachycardia, unspecified: Secondary | ICD-10-CM

## 2021-05-17 NOTE — Progress Notes (Signed)
Remote ICD transmission.   

## 2021-05-27 ENCOUNTER — Ambulatory Visit (HOSPITAL_COMMUNITY)
Admission: RE | Admit: 2021-05-27 | Discharge: 2021-05-27 | Disposition: A | Discharge status: Home / Self Care | Payer: 59 | Source: Ambulatory Visit | Attending: Cardiology | Admitting: Cardiology

## 2021-05-27 ENCOUNTER — Encounter (HOSPITAL_COMMUNITY): Payer: Self-pay | Admitting: Cardiology

## 2021-05-27 ENCOUNTER — Other Ambulatory Visit: Payer: Self-pay

## 2021-05-27 VITALS — BP 140/80 | HR 67 | Wt 204.2 lb

## 2021-05-27 DIAGNOSIS — I5022 Chronic systolic (congestive) heart failure: Secondary | ICD-10-CM | POA: Insufficient documentation

## 2021-05-27 DIAGNOSIS — G4733 Obstructive sleep apnea (adult) (pediatric): Secondary | ICD-10-CM | POA: Diagnosis not present

## 2021-05-27 DIAGNOSIS — N529 Male erectile dysfunction, unspecified: Secondary | ICD-10-CM | POA: Insufficient documentation

## 2021-05-27 DIAGNOSIS — Z79899 Other long term (current) drug therapy: Secondary | ICD-10-CM | POA: Diagnosis not present

## 2021-05-27 DIAGNOSIS — Z7901 Long term (current) use of anticoagulants: Secondary | ICD-10-CM | POA: Insufficient documentation

## 2021-05-27 DIAGNOSIS — I428 Other cardiomyopathies: Secondary | ICD-10-CM | POA: Diagnosis not present

## 2021-05-27 DIAGNOSIS — I110 Hypertensive heart disease with heart failure: Secondary | ICD-10-CM | POA: Diagnosis not present

## 2021-05-27 DIAGNOSIS — Z7984 Long term (current) use of oral hypoglycemic drugs: Secondary | ICD-10-CM | POA: Insufficient documentation

## 2021-05-27 LAB — BASIC METABOLIC PANEL
Anion gap: 12 (ref 5–15)
BUN: 12 mg/dL (ref 8–23)
CO2: 22 mmol/L (ref 22–32)
Calcium: 9.3 mg/dL (ref 8.9–10.3)
Chloride: 105 mmol/L (ref 98–111)
Creatinine, Ser: 1.23 mg/dL (ref 0.61–1.24)
GFR, Estimated: >60 mL/min (ref >60)
Glucose, Bld: 90 mg/dL (ref 70–99)
Potassium: 4.8 mmol/L (ref 3.5–5.1)
Sodium: 139 mmol/L (ref 135–145)

## 2021-05-27 NOTE — Progress Notes (Signed)
Blood collected for TTR genetic testing per Dr McLean.  Order form completed and both shipped by Fed[employer] to [facility].  

## 2021-05-27 NOTE — Patient Instructions (Addendum)
Thank you for your visit today. ? ?Labs done today, your results will be available in MyChart, we will contact you for abnormal readings. ? ?Genetic test has been done, this has to be sent to [place] to be processed and can take 1-2 weeks to get results back.  We will let you know the results. ? ?Your physician has requested that you have an echocardiogram. Echocardiography is a painless test that uses sound waves to create images of your heart. It provides your doctor with information about the size and shape of your heart and how well your heart?s chambers and valves are working. This procedure takes approximately one hour. There are no restrictions for this procedure. ? ? ?Your physician recommends that you schedule a follow-up appointment in: 4 months with an echocardiogram (July 2023) **please call the office in May to arrange your follow up ** ? ?If you have any questions or concerns before your next appointment please send [place] a message through [place] or call our office at [phone].   ? ?TO LEAVE A MESSAGE FOR THE NURSE SELECT OPTION 2, PLEASE LEAVE A MESSAGE INCLUDING: ?YOUR NAME ?DATE OF BIRTH ?CALL BACK NUMBER ?REASON FOR CALL**this is important as we prioritize the call backs ? ?YOU WILL RECEIVE A CALL BACK THE SAME DAY AS LONG AS YOU CALL BEFORE 4:00 PM ? ?At the [facility], you and your health needs are our priority. As part of our continuing mission to provide you with exceptional heart care, we have created designated Provider Care Teams. These Care Teams include your primary Cardiologist (physician) and Advanced Practice Providers (APPs- Physician Assistants and Nurse Practitioners) who all work together to provide you with the care you need, when you need it.  ? ?You may see any of the following providers on your designated Care Team at your next follow up: ?Dr Glori Bickers ?Dr Loralie Champagne ?Darrick Grinder, NP ?Lyda Jester, PA ?Jessica Milford,NP ?Marlyce Huge,  PA ?Audry Riles, PharmD ? ? ?Please be sure to bring in all your medications bottles to every appointment.  ? ?

## 2021-05-28 NOTE — Progress Notes (Signed)
Patient ID: Brian Noble, male   DOB: 02-09-1961, 61 y.o.   MRN: [id number] ? ? ?PCP: [name], MD ?Cardiology: Dr [name] ?EP Dr [name]  ? ?61 y.o. with a prior history of cardiomyopathy who developed a cardiac arrest and was found to have low EF and normal coronaries.  Per patient, he was found to have low EF around 20% about 8 years ago.  He was seen by a cardiologist in [place] (he thinks), and EF went back to normal range.  He had no problems up until 8/16.  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  He was noted to pass out by family.  EMS was called and arrived quickly.  He was in either ventricular fibrillation or VT and was defibrillated.  He was cooled and sent for coronary angiography, which showed no significant coronary disease.  Echo showed EF 10% with severe RV dysfunction.  After recovery, he got a Medtronic ICD.  Repeat echo in 11/16 showed EF 20% with septal-lateral dyssynchrony, moderately decreased RV systolic function.  Unable to take Bidil due to severe headaches.   ? 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 Appropriate shock due to VT. ECHO completed and showed EF 20-25% and normal RV. Carvedilol increased to 18.75 mg twice a day. He was discharged on 12/20/17. He had follow up with Chanetta Marshall EP NP and carvedilol was increased to 25 mg twice a day.  ? ?RHC/LHC was done in 11/19, showing no significant CAD and optimized filling pressures with preserved CI 2.31. CPX in 11/19 showed deconditioning but no significant cardiac limitation.   ? ?Echo in 7/21 showed EF 25-30%, mildly decreased RV systolic function. He had VT with ATP in 2/22, now on mexiletine.  ? ?He returns today for followup of CHF.  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  Weight down 2 lbs since last appointment.  No significant exertional dyspnea. No chest pain.  No lightheadedness.  No orthopnea/PND.  He is off eplerenone due to elevated K.  ? ?ECG (personally reviewed): NSR, 1st  degree AVB, IVCD 142 msec ? ?Labs (9/16): K 4.2, creatinine 1.04 ?Labs (01/04/2015) : K 4.6 Creatinine 1.09, SPEP negative ?Labs (11/16): K 5.5, creatinine 1.29 ?Labs (02/17/2015): K 5.0 creatinine 1.2 ?Labs (06/02/2015): K 4.6 Creatinine 1.24, uric acid 9.9 ?Labs (4/17): TSH normal ?Labs (5/17): K 4, creatinine 1.13, BNP 187, HCT 45.1 ?Labs (10/17): K 4.3, creatinine 1.37 ?Labs (12/19/2017): K 4.7 Creatinine 1/29 Mag 2.  ?Labs (11/19): K 5.1 Creatinine 1.11 BNP 244 Hgb 14.6 ?Labs (1/23): K 4.6, creatinine 1.35 ?  ?PMH: ?1. LBBB/IVCD ?2. HTN: x years ?3. Hyperlipidemia ?4. Chronic systolic CHF: Patient was told around 2008 that his EF was about 20%.  He says that it recovered back to normal.  He thinks he may have seen a cardiologist in [place] at that time. On 11/10/14, he was admitted after having ventricular fibrillation versus VT arrest and being shocked by AED.   ?- LHC (8/16): Normal coronaries, EF < 25%. ?- Echo (8/16): EF 10%, diffuse hypokinesis, mild MR, severely decreased RV systolic function.  ?- Medtronic ICD (8/16).   ?- Echo (11/16): EF 20%, septal-lateral dyssynchrony, mildly dilated LV with mild LVH, moderately decreased RV systolic function.  ?- CPX (3/17): Peak VO2 23.1 (69% predicted peak VO2) VE/VCO2 slope:  32 OUES: 2.00 Peak RER: 1.11, Ventilatory Threshold: 17.7 (53% predicted or measured peak VO2) => Mild functional impairment. ?- Echo (12/17): EF 30%, mild LV  dilation, septal-lateral dyssynchrony, mild to moderately decreased RV systolic function.  ?- ECHO (10/19): EF 20-25%.  ?- LHC/RHC (11/19): No significant coronary disease; RA mean 3, PA 24/10, PCWP mean 7, Cardiac Index (Fick) 2.31.  ?- CPX (11/19): Submaximal with RER 0.8, VE/VCO2 slope 29, peak VO2 15.1 => no significant cardiac limitation, appears deconditioned.  ?- Echo (7/21): EF 25-30%, mildly decreased RV systolic function ?5. Event monitor (4/17): No atrial fibrillation.  Runs NSVT noted.  ?6. VT ?7. OSA: Severe, does not use  CPAP.  ?  ?FH: Sister with heart transplant, father with cardiac arrest when about 5, 2 other sisters with "heart problems."  He has 10 siblings in all.  ? 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 ? ?ROS: All systems reviewed and negative except as per HPI.  ? ?Current Outpatient Medications  ?Medication Sig Dispense Refill  ? albuterol (VENTOLIN HFA) 108 (90 Base) MCG/ACT inhaler Inhale 2 puffs into the lungs every 6 (six) hours as needed. 8.5 g 6  ? aspirin EC 81 MG tablet Take 81 mg by mouth daily.    ? carvedilol (COREG) 12.5 MG tablet TAKE 1.5 TABLETS (18.75 MG TOTAL) BY MOUTH 2 (TWO) TIMES DAILY. 270 tablet 3  ? colchicine 0.6 MG tablet Take 0.6 mg by mouth as needed.    ? empagliflozin (JARDIANCE) 10 MG TABS tablet Take 1 tablet (10 mg total) by mouth daily. Need appointment for further refills 30 tablet 6  ? furosemide (LASIX) 20 MG tablet Take 2 tablets (40 mg total) by mouth as needed for fluid or edema. 60 tablet 11  ? lovastatin (MEVACOR) 20 MG tablet TAKE 1 TABLET BY MOUTH EVERY DAY 90 tablet 2  ? magnesium oxide (MAG-OX) 400 MG tablet Take 400 mg by mouth 2 (two) times a day.     ? mexiletine (MEXITIL) 200 MG capsule TAKE 1 CAPSULE BY MOUTH TWICE A DAY 180 capsule 2  ? sacubitril-valsartan (ENTRESTO) 97-103 MG Take 1 tablet by mouth 2 (two) times daily. 60 tablet 11  ? sildenafil (REVATIO) 20 MG tablet Take 2.5 tablets (50 mg total) by mouth as needed. 30 tablet 0  ? ?No current facility-administered medications for this encounter.  ? ?BP 140/80   Pulse 67   Wt 92.6 kg (204 lb 3.2 oz)   SpO2 97%   BMI 31.05 kg/m?   ?Filed Weights  ? 05/27/21 1035  ?Weight: 92.6 kg (204 lb 3.2 oz)  ? ?Wt Readings from Last 3 Encounters:  ?05/27/21 92.6 kg (204 lb 3.2 oz)  ?02/18/21 93.7 kg (206 lb 9.6 oz)  ?10/29/20 90 kg (198 lb 6.4 oz)  ? ?General: NAD ?Neck: No JVD, no thyromegaly or thyroid nodule.  ?Lungs: Clear to auscultation bilaterally with normal respiratory  effort. ?CV: Nondisplaced PMI.  Heart regular S1/S2, no S3/S4, no murmur.  No peripheral edema.  No carotid bruit.  Normal pedal pulses.  ?Abdomen: Soft, nontender, no hepatosplenomegaly, no distention.  ?Skin: Intact without lesions or rashes.  ?Neurologic: Alert and oriented x 3.  ?Psych: Normal affect. ?Extremities: No clubbing or cyanosis.  ?HEENT: Normal.  ? ?Assessment/Plan: ?1. Chronic systolic CHF: Nonischemic cardiomyopathy.  Medtronic ICD.  Possibly familial CMP given father with SCD at 32, sister with heart transplant, and 2 other sisters with "heart problems."  Cannot rule out prior viral myocarditis or role for HTN. Echo 12/17 with EF 30%, septal-lateral dyssynchrony.  CPX in 3/17 with mild functional impairment. Echo in  10/19 with EF 20-25%.  CPX repeat 01/2018 with submaximal effort but minimal cardiac impairment, suggestive of deconditioning.  [place] in 11/19 showed optimized filling pressures with preserved cardiac index.  Echo in 7/21 with stable EF 25-30%.  NYHA class I symptoms.  He is not volume overloaded on exam.  ?- Fatigue much worse after increasing Coreg to 25 mg bid, also worsening erectile dysfunction. Keep Coreg at 18.75 mg bid.  ?- Continue Entresto 97/103 bid.  BMET today.  ?- Continue Jardiance 10 mg daily.  ?- Unable to tolerate Bidil due to headaches.  ?- Off eplerenone due to hyperkalemia.   ?- Given possible familial CMP, I will send genetic testing from common mutations linked to familial cardiomyopathies.  ?- Patient has IVCD, 142 msec on ECG today.  Given that this is not a true LBBB and QRS < 150, probably not good CRT candidate.  ?2. VT: Has Medtronic ICD.  VT with syncope in 10/19.  VT with ATP in 2/22, started on mexiletine.  ?- Continue mexiletine.  ?3. OSA: Has refused to use CPAP.    ?  ?Followup in 3 months with APP.  ? ?Loralie Champagne ?05/28/2021 ? ?  ? ?

## 2021-06-13 ENCOUNTER — Telehealth: Payer: Self-pay

## 2021-06-13 NOTE — Telephone Encounter (Signed)
Patient returned call

## 2021-06-13 NOTE — Telephone Encounter (Signed)
MDT alert for sustained VT with therapy ?Event occurred 3/24 @ 16:28, EGM shows sustained VT, rate 261, ATP delivered x2 unsuccessful, HV therapy 35J, converting rhythm.  8 additional NSVT. ? ?Spoke to patients wife Brian Noble who is on DPR, states patient was outside picking up hay bells and over exerted himself. States this is not new for patient. Patient is at work and will have him call when he is available.  ? ? ? ? ? ? ? ? ? ? ?

## 2021-06-13 NOTE — Telephone Encounter (Signed)
Patient is unavailable. Wife states she will have patient call back when he is available. ?

## 2021-06-13 NOTE — Telephone Encounter (Signed)
Pt returning Lee's phone call ?

## 2021-06-14 NOTE — Telephone Encounter (Signed)
Returning patients phone call.  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 States he felt his heart rate was becoming elevated but he did not sit down like he normally does. States if he normally sits down his heart rate will calm down but did not get to sit down in time. States he can do other physical things such as riding his bike and does fine.  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   Compliant with medications.  ? ?Shock plan reviewed with verbal understanding. ?[place] DMV driving restrictions x6 months advised to patient with verbal understanding.  ? ?Patient voices concern about only receiving shocks while he is doing physical labor and really needs to drive due to working still.  States he does not receive shocks while he is sitting.  Advised patient I will forward his concern to Dr. Graciela Husbands.  ?

## 2021-06-16 NOTE — Telephone Encounter (Signed)
Ladies  good morning ? ?We need to get this guy in for OV as well as BMET/Mg  sooner rather than later, APP is ok ?Thanks SK   ?

## 2021-06-17 ENCOUNTER — Ambulatory Visit: Payer: 59 | Admitting: Internal Medicine

## 2021-06-17 ENCOUNTER — Encounter: Payer: Self-pay | Admitting: Internal Medicine

## 2021-06-17 ENCOUNTER — Encounter: Payer: Self-pay | Admitting: *Deleted

## 2021-06-17 VITALS — BP 110/72 | HR 60 | Ht 68.0 in | Wt 206.0 lb

## 2021-06-17 DIAGNOSIS — I472 Ventricular tachycardia, unspecified: Secondary | ICD-10-CM

## 2021-06-17 DIAGNOSIS — I428 Other cardiomyopathies: Secondary | ICD-10-CM

## 2021-06-17 NOTE — Patient Instructions (Addendum)
Medication Instructions:  ?Your physician recommends that you continue on your current medications as directed. Please refer to the Current Medication list given to you today. ? ?*If you need a refill on your cardiac medications before your next appointment, please call your pharmacy* ? ? ?Lab Work: ?None ordered ? ? ?Testing/Procedures: ?Your physician has requested that you have a cardiac MRI. Cardiac MRI uses a computer to create images of your heart as its beating, producing both still and moving pictures of your heart and major blood vessels. For further information please visit [email or web address] Please follow the instruction sheet given to you today for more information. ? ? ?                         ? ?ZIO XT- Long Term Monitor Instructions ? ?Your physician has requested you wear a ZIO patch monitor for 14 days.  ?This is a single patch monitor. [employer] supplies one patch monitor per enrollment. Additional ?stickers are not available. Please do not apply patch if you will be having a Nuclear Stress Test,  ?Echocardiogram, Cardiac CT, MRI, or Chest Xray during the period you would be wearing the  ?monitor. The patch cannot be worn during these tests. You cannot remove and re-apply the  ?ZIO XT patch monitor.  ?Your ZIO patch monitor will be mailed 3 day USPS to your address on file. It may take 3-5 days  ?to receive your monitor after you have been enrolled.  ?Once you have received your monitor, please review the enclosed instructions. Your monitor  ?has already been registered assigning a specific monitor serial # to you. ? ?Billing and Patient Assistance Program Information ? ?We have supplied [employer] with any of your insurance information on file for billing purposes. ?[employer] offers a sliding scale Patient Assistance Program for patients that do not have  ?insurance, or whose insurance does not completely cover the cost of the ZIO monitor.  ?You must apply for the Patient Assistance Program to qualify  for this discounted rate.  ?To apply, please call [employer] at [phone], select option 4, select option 2, ask to apply for  ?Patient Assistance Program. Theodore Demark will ask your household income, and how many people  ?are in your household. They will quote your out-of-pocket cost based on that information.  ?[employer] will also be able to set up a 63-month, interest-free payment plan if needed. ? ?Applying the monitor ?  ?Shave hair from upper left chest.  ?Hold abrader disc by orange tab. Rub abrader in 40 strokes over the upper left chest as  ?indicated in your monitor instructions.  ?Clean area with 4 enclosed alcohol pads. Let dry.  ?Apply patch as indicated in monitor instructions. Patch will be placed under collarbone on left  ?side of chest with arrow pointing upward.  ?Rub patch adhesive wings for 2 minutes. Remove white label marked "1". Remove the white  ?label marked "2". Rub patch adhesive wings for 2 additional minutes.  ?While looking in a mirror, press and release button in center of patch. A small [place] light will  ?flash 3-4 times. This will be your only indicator that the monitor has been turned on.  ?Do not shower for the first 24 hours. You may shower after the first 24 hours.  ?Press the button if you feel a symptom. You will hear a small click. Record Date, Time and  ?Symptom in the Patient Logbook.  ?When you are ready to remove the patch,  follow instructions on the last 2 pages of Patient  ?Logbook. Stick patch monitor onto the last page of Patient Logbook.  ?Place Patient Logbook in the blue and white box. Use locking tab on box and tape box closed  ?securely. The blue and white box has prepaid postage on it. Please place it in the mailbox as  ?soon as possible. Your physician should have your test results approximately 7 days after the  ?monitor has been mailed back to [facility].  ?Call [facility] at [phone] if you have questions regarding  ?your ZIO XT patch  monitor. Call them immediately if you see an orange light blinking on your  ?monitor.  ?If your monitor falls off in less than 4 days, contact our Monitor department at [phone].  ?If your monitor becomes loose or falls off after 4 days call [employer] at [phone] for  ?suggestions on securing your monitor ? ? ? ?Follow-Up: ?At [facility], you and your health needs are our priority.  As part of our continuing mission to provide you with exceptional heart care, we have created designated Provider Care Teams.  These Care Teams include your primary Cardiologist (physician) and Advanced Practice Providers (APPs -  Physician Assistants and Nurse Practitioners) who all work together to provide you with the care you need, when you need it. ? ?Remote monitoring is used to monitor your Pacemaker or ICD from home. This monitoring reduces the number of office visits required to check your device to one time per year. It allows [place] to keep an eye on the functioning of your device to ensure it is working properly. You are scheduled for a device check from home on 08/10/2021. You may send your transmission at any time that day. If you have a wireless device, the transmission will be sent automatically. After your physician reviews your transmission, you will receive a postcard with your next transmission date. ? ?Your next appointment:   ?6 month(s) ? ?The format for your next appointment:   ?In Person ? ?Provider:   ?Virl Axe, MD ? ? ?Thank you for choosing [facility]!! ? ? ?[phone] ? ?

## 2021-06-17 NOTE — Progress Notes (Signed)
? ? ? ? ?Patient Care Team: ?[name], MD as PCP - General (Family Medicine) ?Larey Dresser, MD as PCP - Advanced Heart Failure (Cardiology) ?Deboraha Sprang, MD as PCP - Electrophysiology (Cardiology) ? ? ?HPI ? ?Brian EDGERSON is a 61 y.o. male Seen in follow-up for aborted cardiac arrest in the context of nonischemic cardiomyopathy and hypokalemia.  8/16>> ICD.  He has an IVCD for which in the past we decided against CRT  ? 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  Egrams reviewed by Dr. Elliot Cousin who started him on mexiletine.\ ? tolerating mexiletine ? ?The patient denies chest pain, shortness of breath, nocturnal dyspnea, orthopnea or peripheral edema.  There have been no palpitations, lightheadedness or syncope.   ? 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  A recurring theme ? ?  ? ?Appropriate Therapy yes  8/20 VT/VFl (CL 220 msec>> failed ATP + shock---- dirty break with VT NS )  ? Yes 8/21 ; VT ATP 2/22; ATP failed with acceleration ? ?Recurrent therapy 3/23.  Monomorphic ventricular tachycardia cycle length of 230 ms.  ATP successfully terminated with a dirty break with resumption of tachycardia spontaneous termination repeat tachycardia and then acceleration with repeat ATP into ventricular fibrillation and shock. ? ?Guideline directed therapy has not allowed for the continuation of eplerenone secondary to hypokalemia.  He is otherwise on Entresto SGLT2 and carvedilol. ? ?The patient denies chest pain, shortness of breath, nocturnal dyspnea, orthopnea or peripheral edema.  There have been no palpitations, lightheadedness or syncope.   ? ?Inappropriate Therapy  none ?Antiarrhythmics Date  ? mexiletine  8/20  ?   ? ?  ? ? ? ? ?DATE TEST EF   ?11/16    Echo 20 %   ?12/17    Echo 30 %   ?10/19 Echo  20-25%   ?11/19 LHC/RHC  No Obs CAD ?Normal filling press  ?7/21 Echo  25-30%   ?  ? ?Date Cr K Mg  ?12/17   1.17 5.0 2.1  ?8/20 1.27 4.0 2.0(5/20)  ?3/23 1.23 4.8   ? ?OSA-severe-not treated ? ? ?Records and Results Reviewed hosp records ? ?Past Medical History:  ?Diagnosis Date  ? Arthritis   ? Chronic kidney disease   ? Chronic systolic CHF (congestive heart failure) ([place])   ? a. Echo 8/16:  EF 10%, diff HK, Gr 1 DD, mild dilated aortic root, mild MR, mild LAE, severely reduced RVSF  ? Hypertension   ? ICD (implantable cardioverter-defibrillator) in place   ? NICM (nonischemic cardiomyopathy) ([place])   ? a. LHC 8/16:  normal coronary arteries.   ? VF (ventricular fibrillation) ([place])   ? s/p OOH cardiac arrest 8/16 >> s/p ICD  ? ? ?Past Surgical History:  ?Procedure Laterality Date  ? APPENDECTOMY    ? CARDIAC CATHETERIZATION N/A 11/10/2014  ? Procedure: Left Heart Cath and Coronary Angiography;  Surgeon: Lorretta Harp, MD;  Location: [place] [facility];  Service: Cardiovascular;  Laterality: N/A;  ? EP IMPLANTABLE DEVICE N/A 11/16/2014  ? Procedure: ICD Implant;  Surgeon: Deboraha Sprang, MD;  Location: [facility];  Service: Cardiovascular;  Laterality: N/A;  ? RIGHT HEART CATH AND CORONARY ANGIOGRAPHY N/A 01/30/2018  ? Procedure: RIGHT HEART CATH AND CORONARY ANGIOGRAPHY;  Surgeon: Larey Dresser, MD;  Location: [place] [facility];  Service: Cardiovascular;  Laterality: N/A;  ? ULTRASOUND GUIDANCE FOR VASCULAR ACCESS  01/30/2018  ? Procedure: Ultrasound Guidance For Vascular Access;  Surgeon: Larey Dresser, MD;  Location: [place] [facility];  Service: Cardiovascular;;  ? ? ?Current Outpatient Medications  ?Medication Sig Dispense Refill  ? albuterol (VENTOLIN HFA) 108 (90 Base) MCG/ACT inhaler Inhale 2 puffs into the lungs every 6 (six) hours as needed. 8.5 g 6  ? aspirin EC 81 MG tablet Take 81 mg by mouth daily.    ? carvedilol (COREG) 12.5 MG tablet TAKE 1.5 TABLETS (18.75 MG TOTAL) BY MOUTH 2 (TWO) TIMES DAILY. 270 tablet 3  ? colchicine 0.6 MG tablet Take 0.6 mg by mouth as needed.    ? empagliflozin  (JARDIANCE) 10 MG TABS tablet Take 1 tablet (10 mg total) by mouth daily. Need appointment for further refills 30 tablet 6  ? furosemide (LASIX) 20 MG tablet Take 2 tablets (40 mg total) by mouth as needed for fluid or edema. 60 tablet 11  ? lovastatin (MEVACOR) 20 MG tablet TAKE 1 TABLET BY MOUTH EVERY DAY 90 tablet 2  ? magnesium oxide (MAG-OX) 400 MG tablet Take 400 mg by mouth 2 (two) times a day.     ? mexiletine (MEXITIL) 200 MG capsule TAKE 1 CAPSULE BY MOUTH TWICE A DAY 180 capsule 2  ? sacubitril-valsartan (ENTRESTO) 97-103 MG Take 1 tablet by mouth 2 (two) times daily. 60 tablet 11  ? sildenafil (REVATIO) 20 MG tablet Take 2.5 tablets (50 mg total) by mouth as needed. 30 tablet 0  ? ?No current facility-administered medications for this visit.  ? ? ?No Known Allergies ? ? ? ?Review of Systems negative except from HPI and PMH ? ?Physical Exam ?BP 110/72   Pulse 60   Ht 5\' 8"  (1.727 m)   Wt 206 lb (93.4 kg)   SpO2 92%   BMI 31.32 kg/m?  ?Well developed and well nourished in no acute distress ?HENT normal ?Neck supple with JVP-flat ?Clear ?Device pocket well healed; without hematoma or erythema.  There is no tethering  ?Regular rate and rhythm, no  gallop No murmur ?Abd-soft with active BS ?No Clubbing cyanosis  edema ?Skin-warm and dry ?A & Oriented  Grossly normal sensory and motor function ? ?ECG sinus at 60 ?Intervals 23/15/42 ?PVC with a left bundle inferior axis morphology ?IVCD that is left bundle like with a monophasic QRS in lead aVL a qR in lead I and r S in lead V6 ? ?ECG 05/27/2021 reviewed QRSd 142 ms with a qR in lead I monophasic R wave in lead aVL and a Q wave in lead V6 ?ECG 11/21 QRSd 144 with a rS in lead 1L and V6 ?Assessment and  Plan ? ?Aborted cardiac arrest ? ?Ventricular tachycardia/flutter with appropriate therapy 8/20 8/21, 2/22, 3/23 ? ?Ventricular tachycardia-nonsustained ? ?Nonischemic cardiomyopathy ? ?IVCD ? ?Eplerenone intolerance secondary to hyperkalemia ? ?Implantable  defibrillator-Medtronic  The patient's device was interrogated.       ? ?Recurrent ventricular tachycardia.  I am intrigued by the PVC on his twelve-lead as a left bundle inferior axis consistent with an RVOT focus; we will undertake a 2-week Zio patch to quantitate PVCs and to see if we can identify nonsustained VT as a potential marker for mechanism as he may well be a candidate for ablation for his ventricular arrhythmias. ? ?In this regard, cMRI might also be useful.  ? ?His IVCD I do not think is amenable to see written resynchronization ? ? ?We will continue him on his guideline directed therapy with  Jardiance Entresto and carvedilol. ? ?  ? ?  ?

## 2021-06-20 ENCOUNTER — Ambulatory Visit (INDEPENDENT_AMBULATORY_CARE_PROVIDER_SITE_OTHER): Payer: 59

## 2021-06-20 DIAGNOSIS — I472 Ventricular tachycardia, unspecified: Secondary | ICD-10-CM

## 2021-06-20 NOTE — Progress Notes (Unsigned)
Enrolled for [employer] to mail a ZIO XT long term holter monitor to the patients address on file.  

## 2021-06-22 NOTE — Telephone Encounter (Signed)
Pt was seen by Dr Graciela Husbands on 06/17/2021. ?

## 2021-06-27 DIAGNOSIS — I472 Ventricular tachycardia, unspecified: Secondary | ICD-10-CM

## 2021-07-04 ENCOUNTER — Encounter: Payer: Self-pay | Admitting: Internal Medicine

## 2021-08-10 ENCOUNTER — Ambulatory Visit (INDEPENDENT_AMBULATORY_CARE_PROVIDER_SITE_OTHER): Payer: 59

## 2021-08-10 DIAGNOSIS — I428 Other cardiomyopathies: Secondary | ICD-10-CM

## 2021-08-11 LAB — CUP PACEART REMOTE DEVICE CHECK
Battery Remaining Longevity: 39 mo
Battery Voltage: 2.98 V
Brady Statistic RV Percent Paced: 0.01 %
Date Time Interrogation Session: 20230524033525
HighPow Impedance: 77 Ohm
Implantable Lead Implant Date: 20160829
Implantable Lead Location: 753860
Implantable Pulse Generator Implant Date: 20160829
Lead Channel Impedance Value: 399 Ohm
Lead Channel Impedance Value: 494 Ohm
Lead Channel Pacing Threshold Amplitude: 0.625 V
Lead Channel Pacing Threshold Pulse Width: 0.4 ms
Lead Channel Sensing Intrinsic Amplitude: 26.125 mV
Lead Channel Sensing Intrinsic Amplitude: 26.125 mV
Lead Channel Setting Pacing Amplitude: 2 V
Lead Channel Setting Pacing Pulse Width: 0.4 ms
Lead Channel Setting Sensing Sensitivity: 0.3 mV

## 2021-08-23 NOTE — Progress Notes (Signed)
Remote ICD transmission.   

## 2021-09-08 ENCOUNTER — Other Ambulatory Visit: Payer: Self-pay | Admitting: Legal Medicine

## 2021-09-08 DIAGNOSIS — F5221 Male erectile disorder: Secondary | ICD-10-CM

## 2021-09-14 ENCOUNTER — Other Ambulatory Visit (HOSPITAL_COMMUNITY): Payer: Self-pay | Admitting: Cardiology

## 2021-10-08 ENCOUNTER — Other Ambulatory Visit (HOSPITAL_COMMUNITY): Payer: Self-pay | Admitting: Cardiology

## 2021-10-17 ENCOUNTER — Other Ambulatory Visit: Payer: Self-pay | Admitting: Legal Medicine

## 2021-10-17 DIAGNOSIS — J9601 Acute respiratory failure with hypoxia: Secondary | ICD-10-CM

## 2021-11-14 ENCOUNTER — Other Ambulatory Visit: Payer: Self-pay | Admitting: Internal Medicine

## 2021-11-14 ENCOUNTER — Other Ambulatory Visit: Payer: Self-pay | Admitting: Legal Medicine

## 2021-11-14 DIAGNOSIS — I472 Ventricular tachycardia, unspecified: Secondary | ICD-10-CM

## 2021-11-14 DIAGNOSIS — J9601 Acute respiratory failure with hypoxia: Secondary | ICD-10-CM

## 2021-12-01 ENCOUNTER — Encounter (HOSPITAL_COMMUNITY): Payer: Self-pay | Admitting: Cardiology

## 2021-12-07 ENCOUNTER — Ambulatory Visit (HOSPITAL_BASED_OUTPATIENT_CLINIC_OR_DEPARTMENT_OTHER)
Admission: RE | Admit: 2021-12-07 | Discharge: 2021-12-07 | Disposition: A | Discharge status: Home / Self Care | Payer: 59 | Source: Ambulatory Visit | Attending: Cardiology | Admitting: Cardiology

## 2021-12-07 ENCOUNTER — Encounter (HOSPITAL_COMMUNITY): Payer: Self-pay | Admitting: Cardiology

## 2021-12-07 ENCOUNTER — Ambulatory Visit (HOSPITAL_COMMUNITY)
Admission: RE | Admit: 2021-12-07 | Discharge: 2021-12-07 | Disposition: A | Discharge status: Home / Self Care | Payer: 59 | Source: Ambulatory Visit | Attending: Cardiology | Admitting: Cardiology

## 2021-12-07 ENCOUNTER — Other Ambulatory Visit (HOSPITAL_COMMUNITY): Payer: Self-pay

## 2021-12-07 VITALS — BP 102/60 | HR 56 | Wt 188.2 lb

## 2021-12-07 DIAGNOSIS — R55 Syncope and collapse: Secondary | ICD-10-CM | POA: Diagnosis not present

## 2021-12-07 DIAGNOSIS — I5022 Chronic systolic (congestive) heart failure: Secondary | ICD-10-CM | POA: Diagnosis not present

## 2021-12-07 DIAGNOSIS — I110 Hypertensive heart disease with heart failure: Secondary | ICD-10-CM | POA: Diagnosis not present

## 2021-12-07 DIAGNOSIS — Z7984 Long term (current) use of oral hypoglycemic drugs: Secondary | ICD-10-CM | POA: Insufficient documentation

## 2021-12-07 DIAGNOSIS — I493 Ventricular premature depolarization: Secondary | ICD-10-CM | POA: Diagnosis not present

## 2021-12-07 DIAGNOSIS — E875 Hyperkalemia: Secondary | ICD-10-CM | POA: Insufficient documentation

## 2021-12-07 DIAGNOSIS — E782 Mixed hyperlipidemia: Secondary | ICD-10-CM

## 2021-12-07 DIAGNOSIS — I428 Other cardiomyopathies: Secondary | ICD-10-CM | POA: Insufficient documentation

## 2021-12-07 DIAGNOSIS — N529 Male erectile dysfunction, unspecified: Secondary | ICD-10-CM | POA: Insufficient documentation

## 2021-12-07 DIAGNOSIS — G4733 Obstructive sleep apnea (adult) (pediatric): Secondary | ICD-10-CM | POA: Diagnosis not present

## 2021-12-07 DIAGNOSIS — Z79899 Other long term (current) drug therapy: Secondary | ICD-10-CM | POA: Insufficient documentation

## 2021-12-07 DIAGNOSIS — R519 Headache, unspecified: Secondary | ICD-10-CM | POA: Insufficient documentation

## 2021-12-07 LAB — URINALYSIS, ROUTINE W REFLEX MICROSCOPIC
Bacteria, UA: NONE SEEN
Bilirubin Urine: NEGATIVE
Glucose, UA: >=500 mg/dL — AB
Hgb urine dipstick: NEGATIVE
Ketones, ur: NEGATIVE mg/dL
Leukocytes,Ua: NEGATIVE
Nitrite: NEGATIVE
Protein, ur: NEGATIVE mg/dL
Specific Gravity, Urine: 1.022 (ref 1.005–1.030)
pH: 5 (ref 5.0–8.0)

## 2021-12-07 LAB — LIPID PANEL
Cholesterol: 143 mg/dL (ref 0–200)
HDL: 37 mg/dL — ABNORMAL LOW (ref >40)
LDL Cholesterol: 66 mg/dL (ref 0–99)
Total CHOL/HDL Ratio: 3.9 RATIO
Triglycerides: 198 mg/dL — ABNORMAL HIGH (ref <150)
VLDL: 40 mg/dL (ref 0–40)

## 2021-12-07 LAB — BASIC METABOLIC PANEL
Anion gap: 9 (ref 5–15)
BUN: 20 mg/dL (ref 8–23)
CO2: 23 mmol/L (ref 22–32)
Calcium: 9.4 mg/dL (ref 8.9–10.3)
Chloride: 108 mmol/L (ref 98–111)
Creatinine, Ser: 1.35 mg/dL — ABNORMAL HIGH (ref 0.61–1.24)
GFR, Estimated: 60 mL/min — ABNORMAL LOW (ref >60)
Glucose, Bld: 96 mg/dL (ref 70–99)
Potassium: 4.5 mmol/L (ref 3.5–5.1)
Sodium: 140 mmol/L (ref 135–145)

## 2021-12-07 LAB — ECHOCARDIOGRAM COMPLETE
Area-P 1/2: 2.58 cm2
Calc EF: 34.7 %
S' Lateral: 6.2 cm
Single Plane A2C EF: 33.9 %
Single Plane A4C EF: 34.2 %

## 2021-12-07 LAB — BRAIN NATRIURETIC PEPTIDE: B Natriuretic Peptide: 233.9 pg/mL — ABNORMAL HIGH (ref 0.0–100.0)

## 2021-12-07 MED ORDER — EPLERENONE 25 MG PO TABS: 25.0000 mg | ORAL_TABLET | Freq: Every day | ORAL | 3 refills | Status: DC

## 2021-12-07 MED ORDER — LOKELMA 5 G PO PACK: 5.0000 g | PACK | Freq: Every day | ORAL | 6 refills | Status: DC

## 2021-12-07 NOTE — Progress Notes (Signed)
Patient ID: Brian Noble, male   DOB: 09-04-1960, 61 y.o.   MRN: [id number]   PCP: [name], MD Cardiology: Dr Shirlee Latch EP Dr Graciela Husbands   42 y.o. with a prior history of cardiomyopathy who developed a cardiac arrest and was found to have low EF and normal coronaries.  Per patient, he was found to have low EF around 20% about 8 years ago.  He was seen by a cardiologist in [place] (he thinks), and EF went back to normal range.  He had no problems up until 8/16.  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  He was noted to pass out by family.  EMS was called and arrived quickly.  He was in either ventricular fibrillation or VT and was defibrillated.  He was cooled and sent for coronary angiography, which showed no significant coronary disease.  Echo showed EF 10% with severe RV dysfunction.  After recovery, he got a Medtronic ICD.  Repeat echo in 11/16 showed EF 20% with septal-lateral dyssynchrony, moderately decreased RV systolic function.  Unable to take Bidil due to severe headaches.    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 Appropriate shock due to VT. ECHO completed and showed EF 20-25% and normal RV. Carvedilol increased to 18.75 mg twice a day. He was discharged on 12/20/17. He had follow up with [name] EP NP and carvedilol was increased to 25 mg twice a day.   RHC/LHC was done in 11/19, showing no significant CAD and optimized filling pressures with preserved CI 2.31. CPX in 11/19 showed deconditioning but no significant cardiac limitation.    Echo in 7/21 showed EF 25-30%, mildly decreased RV systolic function. He had VT with ATP in 2/22, now on mexiletine.  He had VT again in 3/23 with ICD shock.  Zio monitor (4/23) showed rare PVCs, 40 short NSVT runs.    In 8/23, it looks like he had a VT run again terminated by ATP based on device interrogation.   Echo was done today and reviewed, EF 20%, global hypokinesis, mildly decreased RV systolic function.    He returns today for followup of CHF.  He continues to work full time.  No significant exertional dyspnea.  No chest pain.  No lightheadedness or palpitations.  He rides his bike for exercise. No orthopnea/PND.  Weight is down 16 lbs.    ECG (personally reviewed): NSR, 1st degree AVB, IVCD 142 msec  Labs (9/16): K 4.2, creatinine 1.04 Labs (01/04/2015) : K 4.6 Creatinine 1.09, SPEP negative Labs (11/16): K 5.5, creatinine 1.29 Labs (02/17/2015): K 5.0 creatinine 1.2 Labs (06/02/2015): K 4.6 Creatinine 1.24, uric acid 9.9 Labs (4/17): TSH normal Labs (5/17): K 4, creatinine 1.13, BNP 187, HCT 45.1 Labs (10/17): K 4.3, creatinine 1.37 Labs (12/19/2017): K 4.7 Creatinine 1/29 Mag 2.  Labs (11/19): K 5.1 Creatinine 1.11 BNP 244 Hgb 14.6 Labs (1/23): K 4.6, creatinine 1.35 Labs (3/23): K 4.8, creatinine 1.23   PMH: 1. LBBB/IVCD 2. HTN: x years 3. Hyperlipidemia 4. Chronic systolic CHF: Patient was told around 2008 that his EF was about 20%.  He says that it recovered back to normal.  He thinks he may have seen a cardiologist in [place] at that time. On 11/10/14, he was admitted after having ventricular fibrillation versus VT arrest and being shocked by AED.   - LHC (8/16): Normal coronaries, EF < 25%. - Echo (8/16): EF 10%, diffuse hypokinesis, mild MR, severely decreased RV systolic function.  - Medtronic  ICD (8/16).   - Echo (11/16): EF 20%, septal-lateral dyssynchrony, mildly dilated LV with mild LVH, moderately decreased RV systolic function.  - CPX (3/17): Peak VO2 23.1 (69% predicted peak VO2) VE/VCO2 slope:  32 OUES: 2.00 Peak RER: 1.11, Ventilatory Threshold: 17.7 (53% predicted or measured peak VO2) => Mild functional impairment. - Echo (12/17): EF 30%, mild LV dilation, septal-lateral dyssynchrony, mild to moderately decreased RV systolic function.  - ECHO (10/19): EF 20-25%.  - LHC/RHC (11/19): No significant coronary disease; RA mean 3, PA 24/10, PCWP mean 7, Cardiac Index  (Fick) 2.31.  - CPX (11/19): Submaximal with RER 0.8, VE/VCO2 slope 29, peak VO2 15.1 => no significant cardiac limitation, appears deconditioned.  - Echo (7/21): EF 25-30%, mildly decreased RV systolic function - Echo (9/23): EF 20%, global hypokinesis, mildly decreased RV systolic function.  - Invitae gene testing for cardiomyopathy was negative.  5. Event monitor (4/17): No atrial fibrillation.  Runs NSVT noted.  6. VT 7. OSA: Severe, does not use CPAP.    FH: Sister with heart transplant, father with cardiac arrest when about 49, 2 other sisters with "heart problems."  He has 10 siblings in all.   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  ROS: All systems reviewed and negative except as per HPI.   Current Outpatient Medications  Medication Sig Dispense Refill   albuterol (VENTOLIN HFA) 108 (90 Base) MCG/ACT inhaler Inhale 2 puffs into the lungs every 6 (six) hours as needed for wheezing or shortness of breath. 8.5 each 0   aspirin EC 81 MG tablet Take 81 mg by mouth daily.     carvedilol (COREG) 12.5 MG tablet TAKE 1.5 TABLETS (18.75 MG TOTAL) BY MOUTH 2 (TWO) TIMES DAILY. 270 tablet 3   colchicine 0.6 MG tablet Take 0.6 mg by mouth as needed.     empagliflozin (JARDIANCE) 10 MG TABS tablet Take 1 tablet (10 mg total) by mouth daily. 30 tablet 11   eplerenone (INSPRA) 25 MG tablet Take 1 tablet (25 mg total) by mouth daily. 90 tablet 3   furosemide (LASIX) 20 MG tablet Take 2 tablets (40 mg total) by mouth as needed for fluid or edema. 60 tablet 11   lovastatin (MEVACOR) 20 MG tablet TAKE 1 TABLET BY MOUTH EVERY DAY 90 tablet 2   magnesium oxide (MAG-OX) 400 MG tablet Take 400 mg by mouth 2 (two) times a day.      mexiletine (MEXITIL) 200 MG capsule TAKE 1 CAPSULE BY MOUTH TWICE A DAY 180 capsule 2   sacubitril-valsartan (ENTRESTO) 97-103 MG Take 1 tablet by mouth 2 (two) times daily. 60 tablet 11   sildenafil (REVATIO) 20 MG tablet TAKE 1/2  TABLET BY MOUTH DAILY AS NEEDED FOR ERECTILE DYSFUNCTION 10 tablet 6   sodium zirconium cyclosilicate (LOKELMA) 5 g packet Take 5 g by mouth daily. 30 each 6   No current facility-administered medications for this encounter.   BP 102/60   Pulse (!) 56   Wt 85.4 kg (188 lb 3.2 oz)   SpO2 96%   BMI 28.62 kg/m   Filed Weights   12/07/21 0958  Weight: 85.4 kg (188 lb 3.2 oz)   Wt Readings from Last 3 Encounters:  12/07/21 85.4 kg (188 lb 3.2 oz)  06/17/21 93.4 kg (206 lb)  05/27/21 92.6 kg (204 lb 3.2 oz)   General: NAD Neck: No JVD, no thyromegaly or thyroid nodule.  Lungs: Clear to auscultation bilaterally  with normal respiratory effort. CV: Nondisplaced PMI.  Heart regular S1/S2, no S3/S4, no murmur.  No peripheral edema.  No carotid bruit.  Normal pedal pulses.  Abdomen: Soft, nontender, no hepatosplenomegaly, no distention.  Skin: Intact without lesions or rashes.  Neurologic: Alert and oriented x 3.  Psych: Normal affect. Extremities: No clubbing or cyanosis.  HEENT: Normal.   Assessment/Plan: 1. Chronic systolic CHF: Nonischemic cardiomyopathy.  Medtronic ICD.  Possibly familial CMP given father with SCD at 25, sister with heart transplant, and 2 other sisters with "heart problems."  However, Invitae gene testing was negative for common familial cardiomyopathies.  Cannot rule out prior viral myocarditis or role for HTN.  Zio monitor in 4/23 showed only rare PVCs.  Echo 12/17 with EF 30%, septal-lateral dyssynchrony.  CPX in 3/17 with mild functional impairment. Echo in 10/19 with EF 20-25%.  CPX repeat 01/2018 with submaximal effort but minimal cardiac impairment, suggestive of deconditioning.  [place] in 11/19 showed optimized filling pressures with preserved cardiac index.  Echo in 7/21 with stable EF 25-30%.  Echo today showed EF 20%.  NYHA class I symptoms.  He is not volume overloaded on exam or by Optivol.  - Fatigue much worse after increasing Coreg to 25 mg bid, also worsening  erectile dysfunction. Keep Coreg at 18.75 mg bid.  - Continue Entresto 97/103 bid.  BMET today.  - Continue Jardiance 10 mg daily.  - Unable to tolerate Bidil due to headaches.  - Off eplerenone due to hyperkalemia. I will try him on eplerenone 25 mg daily + Lokelma 5 g daily.  BMET/BNP today and BMET again in 10 days.   - Patient has IVCD, 142 msec on ECG today.  Given that this is not a true LBBB and QRS < 150, probably not good CRT candidate.  2. VT: Has Medtronic ICD.  VT with syncope in 10/19.  VT with ATP in 2/22, started on mexiletine. VT with ICD shock in 3/23.  VT with ATP in 8/23.   - Continue mexiletine. ?transition to amiodarone with recurrent VT, will defer to Dr [name].  - Would like Dr. [name] to comment on feasibility of VT ablation.   - His remote device monitor is not working, needs to be replaced.  Will message device clinic.  3. OSA: Has refused to use CPAP.      Followup in 3 months with APP.   Loralie Champagne 12/07/2021

## 2021-12-07 NOTE — Patient Instructions (Signed)
Start Eplerenone 25mg  daily.  Start Lokelma 5mg  daily.  Labs done today, your results will be available in MyChart, we will contact you for abnormal readings.  Repeat blood work in 10 days   [facility] TO NOTIFY THEM OF YOU MONITOR NOT WORKING AND SCHEDULE AN APPOINTMENT TO SEE HIM.  Your physician recommends that you schedule a follow-up appointment in: 3 months  If you have any questions or concerns before your next appointment please send [place] a message through [place] or call our office at [phone].    TO LEAVE A MESSAGE FOR THE NURSE SELECT OPTION 2, PLEASE LEAVE A MESSAGE INCLUDING: YOUR NAME DATE OF BIRTH CALL BACK NUMBER REASON FOR CALL**this is important as we prioritize the call backs  YOU WILL RECEIVE A CALL BACK THE SAME DAY AS LONG AS YOU CALL BEFORE 4:00 PM  At the [facility], you and your health needs are our priority. As part of our continuing mission to provide you with exceptional heart care, we have created designated Provider Care Teams. These Care Teams include your primary Cardiologist (physician) and Advanced Practice Providers (APPs- Physician Assistants and Nurse Practitioners) who all work together to provide you with the care you need, when you need it.   You may see any of the following providers on your designated Care Team at your next follow up: Dr Glori Bickers Dr Loralie Champagne Dr. Roxana Hires, NP Lyda Jester, [place] [facility], [place] Forestine Na, NP Audry Riles, PharmD   Please be sure to bring in all your medications bottles to every appointment.

## 2021-12-08 ENCOUNTER — Telehealth (HOSPITAL_COMMUNITY): Payer: Self-pay

## 2021-12-08 LAB — URINE CULTURE: Culture: <10000 — AB

## 2021-12-08 NOTE — Telephone Encounter (Addendum)
Pt aware, agreeable, and verbalized understanding   ----- Message from Larey Dresser, MD sent at 12/07/2021  6:21 PM EDT ----- Creatinine stable, lipids ok. No evidence for UTI, high glucose in urine due to SGLT2 inhibitor.

## 2021-12-09 ENCOUNTER — Other Ambulatory Visit: Payer: Self-pay | Admitting: Family Medicine

## 2021-12-18 ENCOUNTER — Other Ambulatory Visit (HOSPITAL_COMMUNITY): Payer: Self-pay | Admitting: Family Medicine

## 2021-12-20 ENCOUNTER — Ambulatory Visit (HOSPITAL_COMMUNITY)
Admission: RE | Admit: 2021-12-20 | Discharge: 2021-12-20 | Disposition: A | Discharge status: Home / Self Care | Payer: 59 | Source: Ambulatory Visit | Attending: Cardiology | Admitting: Cardiology

## 2021-12-20 DIAGNOSIS — I5022 Chronic systolic (congestive) heart failure: Secondary | ICD-10-CM | POA: Diagnosis present

## 2021-12-20 LAB — BASIC METABOLIC PANEL
Anion gap: 9 (ref 5–15)
BUN: 24 mg/dL — ABNORMAL HIGH (ref 8–23)
CO2: 25 mmol/L (ref 22–32)
Calcium: 9.3 mg/dL (ref 8.9–10.3)
Chloride: 103 mmol/L (ref 98–111)
Creatinine, Ser: 1.42 mg/dL — ABNORMAL HIGH (ref 0.61–1.24)
GFR, Estimated: 56 mL/min — ABNORMAL LOW (ref >60)
Glucose, Bld: 90 mg/dL (ref 70–99)
Potassium: 4.5 mmol/L (ref 3.5–5.1)
Sodium: 137 mmol/L (ref 135–145)

## 2021-12-24 ENCOUNTER — Encounter (HOSPITAL_COMMUNITY): Payer: Self-pay | Admitting: Cardiology

## 2021-12-30 DIAGNOSIS — Z5321 Procedure and treatment not carried out due to patient leaving prior to being seen by health care provider: Secondary | ICD-10-CM | POA: Diagnosis not present

## 2021-12-30 DIAGNOSIS — R55 Syncope and collapse: Secondary | ICD-10-CM | POA: Insufficient documentation

## 2021-12-30 NOTE — ED Triage Notes (Signed)
Patient arrived with EMS from home reports brief syncopal episode ( approx.45 secs.) while using the thread mill this evening . CBG= 93. Alert and oriented / ambulatory  at arrival.

## 2021-12-31 ENCOUNTER — Encounter (HOSPITAL_COMMUNITY): Payer: Self-pay | Admitting: Emergency Medicine

## 2021-12-31 ENCOUNTER — Other Ambulatory Visit: Payer: Self-pay

## 2021-12-31 ENCOUNTER — Emergency Department (HOSPITAL_COMMUNITY)
Admission: EM | Admit: 2021-12-31 | Discharge: 2021-12-31 | Discharge status: Against Medical Advice | Payer: 59 | Attending: Emergency Medicine | Admitting: Emergency Medicine

## 2021-12-31 LAB — CBC WITH DIFFERENTIAL/PLATELET
Abs Immature Granulocytes: 0.04 10*3/uL (ref 0.00–0.07)
Basophils Absolute: 0.1 10*3/uL (ref 0.0–0.1)
Basophils Relative: 1 %
Eosinophils Absolute: 0.1 10*3/uL (ref 0.0–0.5)
Eosinophils Relative: 1 %
HCT: 46.4 % (ref 39.0–52.0)
Hemoglobin: 15.2 g/dL (ref 13.0–17.0)
Immature Granulocytes: 0 %
Lymphocytes Relative: 12 %
Lymphs Abs: 1.2 10*3/uL (ref 0.7–4.0)
MCH: 30.3 pg (ref 26.0–34.0)
MCHC: 32.8 g/dL (ref 30.0–36.0)
MCV: 92.4 fL (ref 80.0–100.0)
Monocytes Absolute: 0.8 10*3/uL (ref 0.1–1.0)
Monocytes Relative: 8 %
Neutro Abs: 7.6 10*3/uL (ref 1.7–7.7)
Neutrophils Relative %: 78 %
Platelets: 218 10*3/uL (ref 150–400)
RBC: 5.02 MIL/uL (ref 4.22–5.81)
RDW: 12.6 % (ref 11.5–15.5)
WBC: 9.8 10*3/uL (ref 4.0–10.5)
nRBC: 0 % (ref 0.0–0.2)

## 2021-12-31 LAB — BASIC METABOLIC PANEL
Anion gap: 9 (ref 5–15)
BUN: 20 mg/dL (ref 8–23)
CO2: 24 mmol/L (ref 22–32)
Calcium: 9.1 mg/dL (ref 8.9–10.3)
Chloride: 106 mmol/L (ref 98–111)
Creatinine, Ser: 1.21 mg/dL (ref 0.61–1.24)
GFR, Estimated: >60 mL/min (ref >60)
Glucose, Bld: 131 mg/dL — ABNORMAL HIGH (ref 70–99)
Potassium: 3.8 mmol/L (ref 3.5–5.1)
Sodium: 139 mmol/L (ref 135–145)

## 2021-12-31 LAB — CBG MONITORING, ED: Glucose-Capillary: 124 mg/dL — ABNORMAL HIGH (ref 70–99)

## 2021-12-31 NOTE — ED Notes (Signed)
Pt wanted IV taken out. Pt stated that wait was too long and he wanted to leave. Pt seen leaving the ED with family.

## 2021-12-31 NOTE — ED Provider Triage Note (Signed)
  Emergency Medicine Provider Triage Evaluation Note  MRN:  [id number]  Arrival date & time: 12/31/21    Medically screening exam initiated at 3:03 AM.   CC:   Syncope   HPI:  Brian Noble is a 61 y.o. year-old male presents to the ED with chief complaint of syncope.  Was using the treadmill tonight, became dizzy, sat down on the couch and passed out.  States that he feels fine now.  History provided by patient. ROS:  -As included in HPI PE:   Vitals:   12/31/21 0018 12/31/21 0137  BP: 115/70 113/70  Pulse: (!) 54 60  Resp: 18 16  Temp: 98.2 F (36.8 C) 98 F (36.7 C)  SpO2: 98% 99%    Non-toxic appearing No respiratory distress  MDM:  Based on signs and symptoms, syncope is highest on my differential. I've ordered labs in triage to expedite lab/diagnostic workup.  Patient was informed that the remainder of the evaluation will be completed by another provider, this initial triage assessment does not replace that evaluation, and the importance of remaining in the ED until their evaluation is complete.    Montine Circle, PA-C 12/31/21 [phone]

## 2022-01-01 ENCOUNTER — Encounter (HOSPITAL_COMMUNITY): Payer: Self-pay | Admitting: Cardiology

## 2022-01-02 NOTE — Telephone Encounter (Signed)
Pt not at home to send transmission. Pt scheduled for office visit tomorrow.

## 2022-01-03 ENCOUNTER — Encounter (HOSPITAL_COMMUNITY): Payer: Self-pay | Admitting: Cardiology

## 2022-01-03 ENCOUNTER — Ambulatory Visit (HOSPITAL_COMMUNITY)
Admission: RE | Admit: 2022-01-03 | Discharge: 2022-01-03 | Disposition: A | Discharge status: Home / Self Care | Payer: 59 | Source: Ambulatory Visit | Attending: Cardiology | Admitting: Cardiology

## 2022-01-03 VITALS — BP 122/70 | HR 56 | Wt 190.4 lb

## 2022-01-03 DIAGNOSIS — E875 Hyperkalemia: Secondary | ICD-10-CM | POA: Diagnosis not present

## 2022-01-03 DIAGNOSIS — I447 Left bundle-branch block, unspecified: Secondary | ICD-10-CM | POA: Diagnosis not present

## 2022-01-03 DIAGNOSIS — Z7984 Long term (current) use of oral hypoglycemic drugs: Secondary | ICD-10-CM | POA: Insufficient documentation

## 2022-01-03 DIAGNOSIS — I110 Hypertensive heart disease with heart failure: Secondary | ICD-10-CM | POA: Insufficient documentation

## 2022-01-03 DIAGNOSIS — I5022 Chronic systolic (congestive) heart failure: Secondary | ICD-10-CM | POA: Diagnosis not present

## 2022-01-03 DIAGNOSIS — R55 Syncope and collapse: Secondary | ICD-10-CM | POA: Insufficient documentation

## 2022-01-03 DIAGNOSIS — R519 Headache, unspecified: Secondary | ICD-10-CM | POA: Diagnosis not present

## 2022-01-03 DIAGNOSIS — N529 Male erectile dysfunction, unspecified: Secondary | ICD-10-CM | POA: Diagnosis not present

## 2022-01-03 DIAGNOSIS — I472 Ventricular tachycardia, unspecified: Secondary | ICD-10-CM | POA: Diagnosis not present

## 2022-01-03 DIAGNOSIS — G4733 Obstructive sleep apnea (adult) (pediatric): Secondary | ICD-10-CM | POA: Insufficient documentation

## 2022-01-03 DIAGNOSIS — I428 Other cardiomyopathies: Secondary | ICD-10-CM | POA: Insufficient documentation

## 2022-01-03 DIAGNOSIS — Z79899 Other long term (current) drug therapy: Secondary | ICD-10-CM | POA: Diagnosis not present

## 2022-01-03 LAB — BRAIN NATRIURETIC PEPTIDE: B Natriuretic Peptide: 712.5 pg/mL — ABNORMAL HIGH (ref 0.0–100.0)

## 2022-01-03 LAB — BASIC METABOLIC PANEL
Anion gap: 11 (ref 5–15)
BUN: 20 mg/dL (ref 8–23)
CO2: 25 mmol/L (ref 22–32)
Calcium: 9.1 mg/dL (ref 8.9–10.3)
Chloride: 104 mmol/L (ref 98–111)
Creatinine, Ser: 1.04 mg/dL (ref 0.61–1.24)
GFR, Estimated: >60 mL/min (ref >60)
Glucose, Bld: 90 mg/dL (ref 70–99)
Potassium: 3.9 mmol/L (ref 3.5–5.1)
Sodium: 140 mmol/L (ref 135–145)

## 2022-01-03 MED ORDER — EPLERENONE 50 MG PO TABS: 50.0000 mg | ORAL_TABLET | Freq: Every day | ORAL | 3 refills | Status: DC

## 2022-01-03 NOTE — Progress Notes (Signed)
Patient ID: Brian Noble, male   DOB: 08/18/1960, 61 y.o.   MRN: [id number]   PCP: Brian Anes, MD Cardiology: Dr [name] EP Dr Brian Noble   61 y.o. with a prior history of cardiomyopathy who developed a cardiac arrest and was found to have low EF and normal coronaries.  Per patient, he was found to have low EF around 20% about 8 years ago.  He was seen by a cardiologist in [place] (he thinks), and EF went back to normal range.  He had no problems up until 8/16.  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  He was noted to pass out by family.  EMS was called and arrived quickly.  He was in either ventricular fibrillation or VT and was defibrillated.  He was cooled and sent for coronary angiography, which showed no significant coronary disease.  Echo showed EF 10% with severe RV dysfunction.  After recovery, he got a Medtronic ICD.  Repeat echo in 11/16 showed EF 20% with septal-lateral dyssynchrony, moderately decreased RV systolic function.  Unable to take Bidil due to severe headaches.    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 Appropriate shock due to VT. ECHO completed and showed EF 20-25% and normal RV. Carvedilol increased to 18.75 mg twice a day. He was discharged on 12/20/17. He had follow up with Chanetta Marshall EP NP and carvedilol was increased to 25 mg twice a day.   RHC/LHC was done in 11/19, showing no significant CAD and optimized filling pressures with preserved CI 2.31. CPX in 11/19 showed deconditioning but no significant cardiac limitation.    Echo in 7/21 showed EF 25-30%, mildly decreased RV systolic function. He had VT with ATP in 2/22, now on mexiletine.  He had VT again in 3/23 with ICD shock.  Zio monitor (4/23) showed rare PVCs, 40 short NSVT runs.    In 8/23, it looks like he had a VT run again terminated by ATP based on device interrogation.   Echo in 9/23 showed EF 20%, global hypokinesis, mildly decreased RV systolic function.   He returns  today for followup of CHF.  About a week ago, he had an episode where he passed out transiently. He had been working outside that day and had eaten/drunk little.  He then walked fast on his treadmill for a long time.  He started to feel "bad" and got off the treadmill. He lay down on the sofa and transiently passed out.  When EMS arrived, he was awake/alert.  He went to the ER but left due to the long weight.  No ICD shock.  He has had no lightheadedness or syncope since that time.  He has otherwise felt fine, no significant exertional dyspnea or chest pain.  No orthopnea/PND.   Medtronic device interrogation: Fluid index > threshold with decreased thoracic impedance.  No AF.  No VT episode recorded to explain 10/23 syncopal episode.   ECG (personally reviewed): NSR, 1st degree AVB 212 msec, LBBB 148 msec  Labs (9/16): K 4.2, creatinine 1.04 Labs (01/04/2015) : K 4.6 Creatinine 1.09, SPEP negative Labs (11/16): K 5.5, creatinine 1.29 Labs (02/17/2015): K 5.0 creatinine 1.2 Labs (06/02/2015): K 4.6 Creatinine 1.24, uric acid 9.9 Labs (4/17): TSH normal Labs (5/17): K 4, creatinine 1.13, BNP 187, HCT 45.1 Labs (10/17): K 4.3, creatinine 1.37 Labs (12/19/2017): K 4.7 Creatinine 1/29 Mag 2.  Labs (11/19): K 5.1 Creatinine 1.11 BNP 244 Hgb 14.6 Labs (1/23): K 4.6, creatinine 1.35 Labs (  3/23): K 4.8, creatinine 1.23 Labs (9/23): LDL 66, BNP 234 Labs (10/23): K 3.8, creatinine 1.21   PMH: 1. LBBB/IVCD 2. HTN: x years 3. Hyperlipidemia 4. Chronic systolic CHF: Patient was told around 2008 that his EF was about 20%.  He says that it recovered back to normal.  He thinks he may have seen a cardiologist in [place] at that time. On 11/10/14, he was admitted after having ventricular fibrillation versus VT arrest and being shocked by AED.   - LHC (8/16): Normal coronaries, EF < 25%. - Echo (8/16): EF 10%, diffuse hypokinesis, mild MR, severely decreased RV systolic function.  - Medtronic ICD (8/16).   -  Echo (11/16): EF 20%, septal-lateral dyssynchrony, mildly dilated LV with mild LVH, moderately decreased RV systolic function.  - CPX (3/17): Peak VO2 23.1 (69% predicted peak VO2) VE/VCO2 slope:  32 OUES: 2.00 Peak RER: 1.11, Ventilatory Threshold: 17.7 (53% predicted or measured peak VO2) => Mild functional impairment. - Echo (12/17): EF 30%, mild LV dilation, septal-lateral dyssynchrony, mild to moderately decreased RV systolic function.  - ECHO (10/19): EF 20-25%.  - LHC/RHC (11/19): No significant coronary disease; RA mean 3, PA 24/10, PCWP mean 7, Cardiac Index (Fick) 2.31.  - CPX (11/19): Submaximal with RER 0.8, VE/VCO2 slope 29, peak VO2 15.1 => no significant cardiac limitation, appears deconditioned.  - Echo (7/21): EF 25-30%, mildly decreased RV systolic function - Echo (9/23): EF 20%, global hypokinesis, mildly decreased RV systolic function.  - Invitae gene testing for cardiomyopathy was negative.  5. Event monitor (4/17): No atrial fibrillation.  Runs NSVT noted.  6. VT 7. OSA: Severe, does not use CPAP.  8. Syncope 10/23: No VT recorded by device.    FH: Sister with heart transplant, father with cardiac arrest when about 68, 2 other sisters with "heart problems."  He has 10 siblings in all.   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  ROS: All systems reviewed and negative except as per HPI.   Current Outpatient Medications  Medication Sig Dispense Refill   albuterol (VENTOLIN HFA) 108 (90 Base) MCG/ACT inhaler Inhale 2 puffs into the lungs every 6 (six) hours as needed for wheezing or shortness of breath. 8.5 each 0   aspirin EC 81 MG tablet Take 81 mg by mouth daily.     carvedilol (COREG) 12.5 MG tablet TAKE 1.5 TABLETS (18.75 MG TOTAL) BY MOUTH 2 (TWO) TIMES DAILY. 270 tablet 3   colchicine 0.6 MG tablet TAKE 1 TABLET (0.6 MG TOTAL) BY MOUTH DAILY FOR 7 DAYS. 7 tablet 1   empagliflozin (JARDIANCE) 10 MG TABS tablet Take 1 tablet  (10 mg total) by mouth daily. 30 tablet 11   furosemide (LASIX) 20 MG tablet Take 2 tablets (40 mg total) by mouth as needed for fluid or edema. 60 tablet 11   lovastatin (MEVACOR) 20 MG tablet TAKE 1 TABLET BY MOUTH EVERY DAY 90 tablet 2   magnesium oxide (MAG-OX) 400 MG tablet Take 400 mg by mouth 2 (two) times a day.      mexiletine (MEXITIL) 200 MG capsule TAKE 1 CAPSULE BY MOUTH TWICE A DAY 180 capsule 2   sacubitril-valsartan (ENTRESTO) 97-103 MG Take 1 tablet by mouth 2 (two) times daily. 60 tablet 11   sildenafil (REVATIO) 20 MG tablet TAKE 1/2 TABLET BY MOUTH DAILY AS NEEDED FOR ERECTILE DYSFUNCTION 10 tablet 6   sodium zirconium cyclosilicate (LOKELMA) 5 g packet Take 5 g  by mouth daily. 30 each 6   eplerenone (INSPRA) 50 MG tablet Take 1 tablet (50 mg total) by mouth daily. 90 tablet 3   No current facility-administered medications for this encounter.   BP 122/70   Pulse (!) 56   Wt 86.4 kg (190 lb 6.4 oz)   SpO2 98%   BMI 28.95 kg/m   Filed Weights   01/03/22 1026  Weight: 86.4 kg (190 lb 6.4 oz)   Wt Readings from Last 3 Encounters:  01/03/22 86.4 kg (190 lb 6.4 oz)  12/07/21 85.4 kg (188 lb 3.2 oz)  06/17/21 93.4 kg (206 lb)   General: NAD Neck: No JVD, no thyromegaly or thyroid nodule.  Lungs: Clear to auscultation bilaterally with normal respiratory effort. CV: Nondisplaced PMI.  Heart regular S1/S2, no S3/S4, no murmur.  No peripheral edema.  No carotid bruit.  Normal pedal pulses.  Abdomen: Soft, nontender, no hepatosplenomegaly, no distention.  Skin: Intact without lesions or rashes.  Neurologic: Alert and oriented x 3.  Psych: Normal affect. Extremities: No clubbing or cyanosis.  HEENT: Normal.   Assessment/Plan: 1. Chronic systolic CHF: Nonischemic cardiomyopathy.  Medtronic ICD.  Possibly familial CMP given father with SCD at 28, sister with heart transplant, and 2 other sisters with "heart problems."  However, Invitae gene testing was negative for common  familial cardiomyopathies.  Cannot rule out prior viral myocarditis or role for HTN.  Zio monitor in 4/23 showed only rare PVCs.  Echo 12/17 with EF 30%, septal-lateral dyssynchrony.  CPX in 3/17 with mild functional impairment. Echo in 10/19 with EF 20-25%.  CPX repeat 01/2018 with submaximal effort but minimal cardiac impairment, suggestive of deconditioning.  [place] in 11/19 showed optimized filling pressures with preserved cardiac index.  Echo in 7/21 with stable EF 25-30%.  Echo in 9/23 showed EF 20%.  Though he is not very symptomatic (NYHA class II), Optivol suggests volume overload recently.  He had a syncopal episode without arrhythmia shown on device interrogation toda.  - Fatigue much worse after increasing Coreg to 25 mg bid, also worsening erectile dysfunction. Keep Coreg at 18.75 mg bid.  - Continue Entresto 97/103 bid.  BMET today.  - Continue Jardiance 10 mg daily.  - Unable to tolerate Bidil due to headaches.  - Rather than having him take lasix, I will increase eplerenone to 50 mg daily with BMET/BNP today and BMET in 10 days.  He will continue Lokelma 5 g daily to manage hyperkalemia.   - Patient has LBBB 148 msec.  With recent volume overload and syncopal episode, I think he is symptomatic enough to justify CRT upgrade.  Will have him evaluated for this by Dr. Caryl Noble.  2. VT: Has Medtronic ICD.  VT with syncope in 10/19.  VT with ATP in 2/22, started on mexiletine. VT with ICD shock in 3/23.  VT with ATP in 8/23.   - Continue mexiletine.  3. OSA: Has refused to use CPAP.      Followup in 3 months  Loralie Champagne 01/03/2022

## 2022-01-03 NOTE — Patient Instructions (Signed)
Increase Eplerenone to 50 mg daily.  Labs done today, your results will be available in MyChart, we will contact you for abnormal readings.  Repeat blood work in 10 days.  Please call Dr. Olin Pia office to arrange an appointment with him to discuss CRT upgrade.  Please keep follow up appointment in December.  If you have any questions or concerns before your next appointment please send [place] a message through [place] or call our office at [phone].    TO LEAVE A MESSAGE FOR THE NURSE SELECT OPTION 2, PLEASE LEAVE A MESSAGE INCLUDING: YOUR NAME DATE OF BIRTH CALL BACK NUMBER REASON FOR CALL**this is important as we prioritize the call backs  YOU WILL RECEIVE A CALL BACK THE SAME DAY AS LONG AS YOU CALL BEFORE 4:00 PM  At the [facility], you and your health needs are our priority. As part of our continuing mission to provide you with exceptional heart care, we have created designated Provider Care Teams. These Care Teams include your primary Cardiologist (physician) and Advanced Practice Providers (APPs- Physician Assistants and Nurse Practitioners) who all work together to provide you with the care you need, when you need it.   You may see any of the following providers on your designated Care Team at your next follow up: Dr Glori Bickers Dr Loralie Champagne Dr. Roxana Hires, NP Lyda Jester, [place] [facility] [place], [place] Forestine Na, NP Audry Riles, PharmD   Please be sure to bring in all your medications bottles to every appointment.

## 2022-01-04 ENCOUNTER — Ambulatory Visit (INDEPENDENT_AMBULATORY_CARE_PROVIDER_SITE_OTHER): Payer: 59

## 2022-01-04 DIAGNOSIS — I428 Other cardiomyopathies: Secondary | ICD-10-CM | POA: Diagnosis not present

## 2022-01-04 LAB — CUP PACEART REMOTE DEVICE CHECK
Battery Remaining Longevity: 37 mo
Battery Voltage: 2.96 V
Brady Statistic RV Percent Paced: 0 %
Date Time Interrogation Session: 20231017185647
HighPow Impedance: 64 Ohm
Implantable Lead Implant Date: 20160829
Implantable Lead Location: 753860
Implantable Pulse Generator Implant Date: 20160829
Lead Channel Impedance Value: 380 Ohm
Lead Channel Impedance Value: 456 Ohm
Lead Channel Pacing Threshold Amplitude: 0.75 V
Lead Channel Pacing Threshold Pulse Width: 0.4 ms
Lead Channel Sensing Intrinsic Amplitude: 26.25 mV
Lead Channel Sensing Intrinsic Amplitude: 26.25 mV
Lead Channel Setting Pacing Amplitude: 2 V
Lead Channel Setting Pacing Pulse Width: 0.4 ms
Lead Channel Setting Sensing Sensitivity: 0.3 mV

## 2022-01-05 ENCOUNTER — Other Ambulatory Visit (HOSPITAL_COMMUNITY): Payer: Self-pay | Admitting: Family Medicine

## 2022-01-13 ENCOUNTER — Ambulatory Visit (HOSPITAL_COMMUNITY)
Admission: RE | Admit: 2022-01-13 | Discharge: 2022-01-13 | Disposition: A | Discharge status: Home / Self Care | Payer: 59 | Source: Ambulatory Visit | Attending: Cardiology | Admitting: Cardiology

## 2022-01-13 DIAGNOSIS — I5022 Chronic systolic (congestive) heart failure: Secondary | ICD-10-CM | POA: Insufficient documentation

## 2022-01-13 LAB — BASIC METABOLIC PANEL
Anion gap: 9 (ref 5–15)
BUN: 15 mg/dL (ref 8–23)
CO2: 25 mmol/L (ref 22–32)
Calcium: 9 mg/dL (ref 8.9–10.3)
Chloride: 106 mmol/L (ref 98–111)
Creatinine, Ser: 1.29 mg/dL — ABNORMAL HIGH (ref 0.61–1.24)
GFR, Estimated: >60 mL/min (ref >60)
Glucose, Bld: 94 mg/dL (ref 70–99)
Potassium: 4.4 mmol/L (ref 3.5–5.1)
Sodium: 140 mmol/L (ref 135–145)

## 2022-01-17 NOTE — Progress Notes (Signed)
Remote ICD transmission.   

## 2022-01-31 ENCOUNTER — Other Ambulatory Visit: Payer: Self-pay | Admitting: Legal Medicine

## 2022-01-31 ENCOUNTER — Other Ambulatory Visit (HOSPITAL_COMMUNITY): Payer: Self-pay | Admitting: Family Medicine

## 2022-01-31 DIAGNOSIS — E782 Mixed hyperlipidemia: Secondary | ICD-10-CM

## 2022-02-02 ENCOUNTER — Encounter: Payer: Self-pay | Admitting: Internal Medicine

## 2022-02-12 ENCOUNTER — Encounter (HOSPITAL_COMMUNITY): Payer: Self-pay | Admitting: Cardiology

## 2022-02-13 ENCOUNTER — Other Ambulatory Visit (HOSPITAL_COMMUNITY): Payer: Self-pay | Admitting: *Deleted

## 2022-02-13 MED ORDER — EPLERENONE 50 MG PO TABS: 50.0000 mg | ORAL_TABLET | Freq: Every day | ORAL | 3 refills | Status: DC

## 2022-03-07 ENCOUNTER — Telehealth (HOSPITAL_COMMUNITY): Payer: Self-pay

## 2022-03-07 ENCOUNTER — Other Ambulatory Visit (HOSPITAL_COMMUNITY): Payer: Self-pay

## 2022-03-07 NOTE — Telephone Encounter (Signed)
Advanced Heart Failure Patient Advocate Encounter  Prior authorization is required for [facility]. PA submitted and APPROVED on 03/07/22. Test claim returns $0 copay for 30 days.  Key [id number] Effective: 03/07/22 - 03/08/23

## 2022-03-08 NOTE — Progress Notes (Signed)
Patient ID: Brian Noble, male   DOB: 11/09/1960, 61 y.o.   MRN: [id number]   PCP: [name], MD Cardiology: Dr [name] EP Dr [name]   61 y.o. with a prior history of cardiomyopathy who developed a cardiac arrest and was found to have low EF and normal coronaries.  Per patient, he was found to have low EF around 20% about 8 years ago.  He was seen by a cardiologist in [place] (he thinks), and EF went back to normal range.  He had no problems up until 8/16.  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  He was noted to pass out by family.  EMS was called and arrived quickly.  He was in either ventricular fibrillation or VT and was defibrillated.  He was cooled and sent for coronary angiography, which showed no significant coronary disease.  Echo showed EF 10% with severe RV dysfunction.  After recovery, he got a Medtronic ICD.  Repeat echo in 11/16 showed EF 20% with septal-lateral dyssynchrony, moderately decreased RV systolic function.  Unable to take Bidil due to severe headaches.    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 Appropriate shock due to VT. ECHO completed and showed EF 20-25% and normal RV. Carvedilol increased to 18.75 mg twice a day. He was discharged on 12/20/17. He had follow up with Chanetta Marshall EP NP and carvedilol was increased to 25 mg twice a day.   RHC/LHC was done in 11/19, showing no significant CAD and optimized filling pressures with preserved CI 2.31. CPX in 11/19 showed deconditioning but no significant cardiac limitation.    Echo in 7/21 showed EF 25-30%, mildly decreased RV systolic function. He had VT with ATP in 2/22, now on mexiletine.  He had VT again in 3/23 with ICD shock.  Zio monitor (4/23) showed rare PVCs, 40 short NSVT runs.    In 8/23, it looks like he had a VT run again terminated by ATP based on device interrogation.   Echo in 9/23 showed EF 20%, global hypokinesis, mildly decreased RV systolic function.   Follow up  10/23, had an episode where he passed out transiently (in the setting of poor po intake and being on a treadmill). No events on device interrogation. Referred to EP to discuss CRT-D.    Today he returns for HF follow up. Overall feeling fine. He works full time in [employer] and has a physically demanding job, he has no shortness of breath at work. Denies palpitations, abnormal bleeding, CP, dizziness, edema, or PND/Orthopnea. Appetite ok. No fever or chills. Weight at home 185 pounds. Taking all medications.   Medtronic device interrogation: OptiVol down, stable thoracic impedence.  No AF.  No VT. 6.3 hr/day activity  ECG (personally reviewed): None ordered today.  Labs (4/17): TSH normal Labs (5/17): K 4, creatinine 1.13, BNP 187, HCT 45.1 Labs (10/17): K 4.3, creatinine 1.37 Labs (12/19/2017): K 4.7 Creatinine 1/29 Mag 2.  Labs (11/19): K 5.1 Creatinine 1.11 BNP 244 Hgb 14.6 Labs (1/23): K 4.6, creatinine 1.35 Labs (3/23): K 4.8, creatinine 1.23 Labs (9/23): LDL 66, BNP 234 Labs (10/23): K 3.8, creatinine 1.21   PMH: 1. LBBB/IVCD 2. HTN: x years 3. Hyperlipidemia 4. Chronic systolic CHF: Patient was told around 2008 that his EF was about 20%.  He says that it recovered back to normal.  He thinks he may have seen a cardiologist in [place] at that time. On 11/10/14, he was admitted after having ventricular  fibrillation versus VT arrest and being shocked by AED.   - LHC (8/16): Normal coronaries, EF < 25%. - Echo (8/16): EF 10%, diffuse hypokinesis, mild MR, severely decreased RV systolic function.  - Medtronic ICD (8/16).   - Echo (11/16): EF 20%, septal-lateral dyssynchrony, mildly dilated LV with mild LVH, moderately decreased RV systolic function.  - CPX (3/17): Peak VO2 23.1 (69% predicted peak VO2) VE/VCO2 slope:  32 OUES: 2.00 Peak RER: 1.11, Ventilatory Threshold: 17.7 (53% predicted or measured peak VO2) => Mild functional impairment. - Echo (12/17): EF 30%, mild LV  dilation, septal-lateral dyssynchrony, mild to moderately decreased RV systolic function.  - ECHO (10/19): EF 20-25%.  - LHC/RHC (11/19): No significant coronary disease; RA mean 3, PA 24/10, PCWP mean 7, Cardiac Index (Fick) 2.31.  - CPX (11/19): Submaximal with RER 0.8, VE/VCO2 slope 29, peak VO2 15.1 => no significant cardiac limitation, appears deconditioned.  - Echo (7/21): EF 25-30%, mildly decreased RV systolic function - Echo (9/23): EF 20%, global hypokinesis, mildly decreased RV systolic function.  - [employer]ae gene testing for cardiomyopathy was negative.  5. Event monitor (4/17): No atrial fibrillation.  Runs NSVT noted.  6. VT 7. OSA: Severe, does not use CPAP.  8. Syncope 10/23: No VT recorded by device.    FH: Sister with heart transplant, father with cardiac arrest when about 29, 2 other sisters with "heart problems."  He has 10 siblings in all.   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  ROS: All systems reviewed and negative except as per HPI.   Current Outpatient Medications  Medication Sig Dispense Refill   albuterol (VENTOLIN HFA) 108 (90 Base) MCG/ACT inhaler Inhale 2 puffs into the lungs every 6 (six) hours as needed for wheezing or shortness of breath. 8.5 each 0   aspirin EC 81 MG tablet Take 81 mg by mouth daily.     carvedilol (COREG) 12.5 MG tablet TAKE 1.5 TABLETS (18.75 MG TOTAL) BY MOUTH 2 (TWO) TIMES DAILY. 270 tablet 3   colchicine 0.6 MG tablet TAKE 1 TABLET (0.6 MG TOTAL) BY MOUTH DAILY FOR 7 DAYS. 7 tablet 1   empagliflozin (JARDIANCE) 10 MG TABS tablet Take 1 tablet (10 mg total) by mouth daily. 30 tablet 11   ENTRESTO 97-103 MG TAKE 1 TABLET BY MOUTH TWICE A DAY 60 tablet 11   eplerenone (INSPRA) 50 MG tablet Take 1 tablet (50 mg total) by mouth daily. 90 tablet 3   furosemide (LASIX) 20 MG tablet Take 2 tablets (40 mg total) by mouth as needed for fluid or edema. 60 tablet 11   LOKELMA 10 g PACK packet MIX AND  TAKE 10 GRAMS BY MOUTH DAILY. 30 packet 0   lovastatin (MEVACOR) 20 MG tablet TAKE 1 TABLET BY MOUTH EVERY DAY 90 tablet 2   magnesium oxide (MAG-OX) 400 MG tablet Take 400 mg by mouth 2 (two) times a day.      mexiletine (MEXITIL) 200 MG capsule TAKE 1 CAPSULE BY MOUTH TWICE A DAY 180 capsule 2   sildenafil (REVATIO) 20 MG tablet TAKE 1/2 TABLET BY MOUTH DAILY AS NEEDED FOR ERECTILE DYSFUNCTION 10 tablet 6   No current facility-administered medications for this encounter.   BP 120/80   Pulse (!) 54   Wt 87.1 kg (192 lb)   SpO2 95%   BMI 29.19 kg/m   Wt Readings from Last 3 Encounters:  03/10/22 87.1 kg (192 lb)  01/03/22 86.4  kg (190 lb 6.4 oz)  12/07/21 85.4 kg (188 lb 3.2 oz)   Physical Exam General:  NAD. No resp difficulty HEENT: Normal Neck: Supple. No JVD. Carotids 2+ bilat; no bruits. No lymphadenopathy or thryomegaly appreciated. Cor: PMI nondisplaced. Regular rate & rhythm. No rubs, gallops or murmurs. Lungs: Clear Abdomen: Soft, nontender, nondistended. No hepatosplenomegaly. No bruits or masses. Good bowel sounds. Extremities: No cyanosis, clubbing, rash, edema Neuro: Alert & oriented x 3, cranial nerves grossly intact. Moves all 4 extremities w/o difficulty. Affect pleasant.  Assessment/Plan: 1. Chronic systolic CHF: Nonischemic cardiomyopathy.  Medtronic ICD.  Possibly familial CMP given father with SCD at 75, sister with heart transplant, and 2 other sisters with "heart problems."  However, [employer]ae gene testing was negative for common familial cardiomyopathies.  Cannot rule out prior viral myocarditis or role for HTN.  Zio monitor in 4/23 showed only rare PVCs.  Echo 12/17 with EF 30%, septal-lateral dyssynchrony.  CPX in 3/17 with mild functional impairment. Echo in 10/19 with EF 20-25%.  CPX repeat 01/2018 with submaximal effort but minimal cardiac impairment, suggestive of deconditioning.  RHC in 11/19 showed optimized filling pressures with preserved cardiac index.   Echo in 7/21 with stable EF 25-30%.  Echo in 9/23 showed EF 20%.  NYHA I-II, he is not volume overloaded on exam or OptiVol. - Continue Coreg at 18.75 mg bid (Worsening fatigue and ED at higher doses). - Continue Entresto 97/103 bid.  BMET today.  - Continue Jardiance 10 mg daily.  - Continue eplerenone 50 mg daily.  - He will continue Lokelma 5 g daily to manage hyperkalemia.   - Unable to tolerate Bidil due to headaches.  - Patient has LBBB 148 msec.  He had recent volume overload and syncopal episode and was felt symptomatic enough to justify CRT upgrade. He was referred back to Dr. Graciela Husbands to discuss. 2. VT: Has Medtronic ICD.  VT with syncope in 10/19.  VT with ATP in 2/22, started on mexiletine. VT with ICD shock in 3/23.  VT with ATP in 8/23.   - Continue mexiletine.  3. OSA: Has refused to use CPAP.      Followup in 3 months with Dr. Shirlee Latch.  Anderson Malta [facility] FNP-BC 03/10/2022

## 2022-03-10 ENCOUNTER — Encounter (HOSPITAL_COMMUNITY): Payer: Self-pay

## 2022-03-10 ENCOUNTER — Ambulatory Visit (HOSPITAL_COMMUNITY)
Admission: RE | Admit: 2022-03-10 | Discharge: 2022-03-10 | Disposition: A | Discharge status: Home / Self Care | Payer: 59 | Source: Ambulatory Visit | Attending: Family Medicine | Admitting: Family Medicine

## 2022-03-10 VITALS — BP 120/80 | HR 54 | Wt 192.0 lb

## 2022-03-10 DIAGNOSIS — G4733 Obstructive sleep apnea (adult) (pediatric): Secondary | ICD-10-CM | POA: Diagnosis not present

## 2022-03-10 DIAGNOSIS — Z7984 Long term (current) use of oral hypoglycemic drugs: Secondary | ICD-10-CM | POA: Diagnosis not present

## 2022-03-10 DIAGNOSIS — I5022 Chronic systolic (congestive) heart failure: Secondary | ICD-10-CM

## 2022-03-10 DIAGNOSIS — Z79899 Other long term (current) drug therapy: Secondary | ICD-10-CM | POA: Diagnosis not present

## 2022-03-10 DIAGNOSIS — E875 Hyperkalemia: Secondary | ICD-10-CM | POA: Insufficient documentation

## 2022-03-10 DIAGNOSIS — I428 Other cardiomyopathies: Secondary | ICD-10-CM | POA: Diagnosis not present

## 2022-03-10 DIAGNOSIS — I110 Hypertensive heart disease with heart failure: Secondary | ICD-10-CM | POA: Diagnosis not present

## 2022-03-10 DIAGNOSIS — I472 Ventricular tachycardia, unspecified: Secondary | ICD-10-CM

## 2022-03-10 DIAGNOSIS — I447 Left bundle-branch block, unspecified: Secondary | ICD-10-CM | POA: Insufficient documentation

## 2022-03-10 LAB — BASIC METABOLIC PANEL
Anion gap: 6 (ref 5–15)
BUN: 17 mg/dL (ref 8–23)
CO2: 25 mmol/L (ref 22–32)
Calcium: 9 mg/dL (ref 8.9–10.3)
Chloride: 108 mmol/L (ref 98–111)
Creatinine, Ser: 1.07 mg/dL (ref 0.61–1.24)
GFR, Estimated: >60 mL/min (ref >60)
Glucose, Bld: 78 mg/dL (ref 70–99)
Potassium: 4.6 mmol/L (ref 3.5–5.1)
Sodium: 139 mmol/L (ref 135–145)

## 2022-03-10 NOTE — Patient Instructions (Addendum)
There has been no changes to your medications..  Labs done today, your results will be available in MyChart, we will contact you for abnormal readings.  Your physician recommends that you schedule a follow-up appointment in: 3 months   If you have any questions or concerns before your next appointment please send us a message through mychart or call our office at [phone].    TO LEAVE A MESSAGE FOR THE NURSE SELECT OPTION 2, PLEASE LEAVE A MESSAGE INCLUDING: YOUR NAME DATE OF BIRTH CALL BACK NUMBER REASON FOR CALL**this is important as we prioritize the call backs  YOU WILL RECEIVE A CALL BACK THE SAME DAY AS LONG AS YOU CALL BEFORE 4:00 PM  At the [facility], you and your health needs are our priority. As part of our continuing mission to provide you with exceptional heart care, we have created designated Provider Care Teams. These Care Teams include your primary Cardiologist (physician) and Advanced Practice Providers (APPs- Physician Assistants and Nurse Practitioners) who all work together to provide you with the care you need, when you need it.   You may see any of the following providers on your designated Care Team at your next follow up: Dr Daniel Bensimhon Dr Dalton McLean Dr. Aditya Sabharwal Amy Clegg, NP Brittainy Simmons, PA Jessica Milford,NP Lindsay Finch, PA Alma Diaz, NP Lauren Kemp, PharmD   Please be sure to bring in all your medications bottles to every appointment.    

## 2022-03-15 ENCOUNTER — Other Ambulatory Visit: Payer: Self-pay | Admitting: Family Medicine

## 2022-03-15 DIAGNOSIS — F5221 Male erectile disorder: Secondary | ICD-10-CM

## 2022-03-24 ENCOUNTER — Encounter: Payer: Self-pay | Admitting: Nurse Practitioner

## 2022-03-24 ENCOUNTER — Ambulatory Visit: Payer: 59 | Admitting: Nurse Practitioner

## 2022-03-24 VITALS — BP 120/70 | HR 59 | Temp 97.3°F | Resp 16 | Ht 68.0 in | Wt 192.6 lb

## 2022-03-24 DIAGNOSIS — Z2821 Immunization not carried out because of patient refusal: Secondary | ICD-10-CM

## 2022-03-24 DIAGNOSIS — E782 Mixed hyperlipidemia: Secondary | ICD-10-CM

## 2022-03-24 DIAGNOSIS — I5022 Chronic systolic (congestive) heart failure: Secondary | ICD-10-CM

## 2022-03-24 DIAGNOSIS — Z95810 Presence of automatic (implantable) cardiac defibrillator: Secondary | ICD-10-CM

## 2022-03-24 DIAGNOSIS — Z125 Encounter for screening for malignant neoplasm of prostate: Secondary | ICD-10-CM

## 2022-03-24 DIAGNOSIS — I10 Essential (primary) hypertension: Secondary | ICD-10-CM

## 2022-03-24 NOTE — Progress Notes (Signed)
Subjective:  Patient ID: Brian Noble, male    DOB: Oct 13, 1960  Age: 63 y.o. MRN: [id number]  Chief Complaints: For regular follow up  History of Present illness: Brian Noble presents to the clinic for a regular follow up with no any significant complaints. He wants his lab needs to be drawn. Denies chest pain, SHORTNESS OF BREATH, distress and PND. He is regularly following up to the cardiologist.  For HYPERTENSION:  He was last seen for hypertension 6 months ago.  BP at this visit is 120/70. Management includes coreg 18.7 mg BD, Entresto 97-103 mg BD, lovastatin 20 mg OD  He reports good compliance with treatment. He is having side effects. He is following a Low Sodium diet. He is exercising. He does not smoke.      Use of agents associated with hypertension: none.   For CONGESTIVE HEART FAILURE:  Following Cardiology.   Last Echo (12/07/2021) EF 20% Current medicines include carvidilol, eplereone, farxiga, mexilitine, entresto.  Patient denies adverse effects of medicines. Patient is monitoring weight.  Patient denies SHORTNESS OF BREATH, pedal edema, and PND.    Patient has a ICD since 10/23/2018 and following Dr Brian Noble.   Pertinent labs: Lab Results  Component Value Date   CHOL 143 12/07/2021   HDL 37 (L) 12/07/2021   LDLCALC 66 12/07/2021   TRIG 198 (H) 12/07/2021   CHOLHDL 3.9 12/07/2021   Lab Results  Component Value Date   NA 139 03/10/2022   K 4.6 03/10/2022   CREATININE 1.07 03/10/2022   EGFR 72 10/29/2020   GFRNONAA >60 03/10/2022   GLUCOSE 78 03/10/2022     The 10-year ASCVD risk score (Arnett DK, et al., 2019) is: 16.6%     Current Outpatient Medications on File Prior to Visit  Medication Sig Dispense Refill   albuterol (VENTOLIN HFA) 108 (90 Base) MCG/ACT inhaler Inhale 2 puffs into the lungs every 6 (six) hours as needed for wheezing or shortness of breath. 8.5 each 0   aspirin EC 81 MG tablet Take 81 mg by mouth daily.     carvedilol (COREG)  12.5 MG tablet TAKE 1.5 TABLETS (18.75 MG TOTAL) BY MOUTH 2 (TWO) TIMES DAILY. 270 tablet 3   colchicine 0.6 MG tablet TAKE 1 TABLET (0.6 MG TOTAL) BY MOUTH DAILY FOR 7 DAYS. 7 tablet 1   empagliflozin (JARDIANCE) 10 MG TABS tablet Take 1 tablet (10 mg total) by mouth daily. 30 tablet 11   ENTRESTO 97-103 MG TAKE 1 TABLET BY MOUTH TWICE A DAY 60 tablet 11   eplerenone (INSPRA) 50 MG tablet Take 1 tablet (50 mg total) by mouth daily. 90 tablet 3   furosemide (LASIX) 20 MG tablet Take 2 tablets (40 mg total) by mouth as needed for fluid or edema. 60 tablet 11   LOKELMA 10 g PACK packet MIX AND TAKE 10 GRAMS BY MOUTH DAILY. 30 packet 0   lovastatin (MEVACOR) 20 MG tablet TAKE 1 TABLET BY MOUTH EVERY DAY 90 tablet 2   magnesium oxide (MAG-OX) 400 MG tablet Take 400 mg by mouth 2 (two) times a day.      mexiletine (MEXITIL) 200 MG capsule TAKE 1 CAPSULE BY MOUTH TWICE A DAY 180 capsule 2   sildenafil (REVATIO) 20 MG tablet TAKE 1/2 TABLET BY MOUTH DAILY AS NEEDED FOR ERECTILE DYSFUNCTION 10 tablet 6   No current facility-administered medications on file prior to visit.   Past Medical History:  Diagnosis Date   Arthritis  Chronic kidney disease    Chronic systolic CHF (congestive heart failure) ([place])    a. Echo 8/16:  EF 10%, diff HK, Gr 1 DD, mild dilated aortic root, mild MR, mild LAE, severely reduced RVSF   Hypertension    ICD (implantable cardioverter-defibrillator) in place    NICM (nonischemic cardiomyopathy) ([place])    a. LHC 8/16:  normal coronary arteries.    VF (ventricular fibrillation) (HCC)    s/p OOH cardiac arrest 8/16 >> s/p ICD   Past Surgical History:  Procedure Laterality Date   APPENDECTOMY     CARDIAC CATHETERIZATION N/A 11/10/2014   Procedure: Left Heart Cath and Coronary Angiography;  Surgeon: Lorretta Harp, MD;  Location: [place] [facility];  Service: Cardiovascular;  Laterality: N/A;   EP IMPLANTABLE DEVICE N/A 11/16/2014   Procedure: ICD Implant;  Surgeon:  Deboraha Sprang, MD;  Location: [place] [facility];  Service: Cardiovascular;  Laterality: N/A;   RIGHT HEART CATH AND CORONARY ANGIOGRAPHY N/A 01/30/2018   Procedure: RIGHT HEART CATH AND CORONARY ANGIOGRAPHY;  Surgeon: Larey Dresser, MD;  Location: [place] [facility];  Service: Cardiovascular;  Laterality: N/A;   ULTRASOUND GUIDANCE FOR VASCULAR ACCESS  01/30/2018   Procedure: Ultrasound Guidance For Vascular Access;  Surgeon: Larey Dresser, MD;  Location: [place] [facility];  Service: Cardiovascular;;    Family History  Problem Relation Age of Onset   Hypertension Father    Sudden death Father    Hypertension Mother    Heart disease Sister    Heart disease Sister    Hypertension Sister    Hypertension Sister    Hypertension Brother    Hypertension Brother    Hypertension Brother    Hypertension Brother    Hypertension Sister    Social History   Socioeconomic History   Marital status: Married    Spouse name: Not on file   Number of children: 2   Years of education: 12   Highest education level: 12th grade  Occupational History   Not on file  Tobacco Use   Smoking status: Never   Smokeless tobacco: Former    Types: Chew    Quit date: 2000  Vaping Use   Vaping Use: Never used  Substance and Sexual Activity   Alcohol use: No   Drug use: No   Sexual activity: Yes  Other Topics Concern   Not on file  Social History Narrative   Not on file   Social Determinants of Health   Financial Resource Strain: Low Risk  (02/19/2018)   Overall Financial Resource Strain (CARDIA)    Difficulty of Paying Living Expenses: Not hard at all  Food Insecurity: No Food Insecurity (02/19/2018)   Hunger Vital Sign    Worried About Running Out of Food in the Last Year: Never true    Ran Out of Food in the Last Year: Never true  Transportation Needs: No Transportation Needs (02/19/2018)   PRAPARE - Hydrologist (Medical): No    Lack of Transportation  (Non-Medical): No  Physical Activity: Not on file  Stress: Not on file  Social Connections: Not on file    Review of Systems  Constitutional:  Negative for chills, fatigue and fever.  HENT:  Negative for congestion, ear pain and sore throat.   Respiratory:  Negative for cough and shortness of breath.   Cardiovascular:  Negative for chest pain and palpitations.  Gastrointestinal:  Negative for abdominal pain, constipation, diarrhea,  nausea and vomiting.  Endocrine: Negative for polydipsia, polyphagia and polyuria.  Genitourinary:  Negative for dysuria and frequency.  Musculoskeletal:  Negative for arthralgias and back pain.  Neurological:  Negative for dizziness and headaches.  Psychiatric/Behavioral:  Negative for dysphoric mood. The patient is not nervous/anxious.      Objective:  BP 120/70   Pulse (!) 59   Temp (!) 97.3 F (36.3 C)   Resp 16   Ht 5\' 8"  (1.727 m)   Wt 192 lb 9.6 oz (87.4 kg)   SpO2 100%   BMI 29.28 kg/m      03/24/2022    8:01 AM 03/10/2022    9:05 AM 01/03/2022   10:26 AM  BP/Weight  Systolic BP 123456 [id number]  Diastolic BP 70 80 70  Wt. (Lbs) 192.6 192 190.4  BMI 29.28 kg/m2 29.19 kg/m2 28.95 kg/m2    Physical Exam Vitals reviewed.  Constitutional:      Appearance: Normal appearance.  Neck:     Vascular: No carotid bruit.  Cardiovascular:     Rate and Rhythm: Normal rate and regular rhythm.     Pulses: Normal pulses.     Heart sounds: Normal heart sounds.  Pulmonary:     Breath sounds: Normal breath sounds.  Abdominal:     Tenderness: There is no abdominal tenderness.  Neurological:     Mental Status: He is alert and oriented to person, place, and time.  Psychiatric:        Mood and Affect: Mood normal.        Behavior: Behavior normal.     Lab Results  Component Value Date   WBC 9.8 12/31/2021   HGB 15.2 12/31/2021   HCT 46.4 12/31/2021   PLT 218 12/31/2021   GLUCOSE 78 03/10/2022   CHOL 143 12/07/2021   TRIG 198 (H) 12/07/2021    HDL 37 (L) 12/07/2021   LDLCALC 66 12/07/2021   ALT 39 07/30/2020   AST 32 07/30/2020   NA 139 03/10/2022   K 4.6 03/10/2022   CL 108 03/10/2022   CREATININE 1.07 03/10/2022   BUN 17 03/10/2022   CO2 25 03/10/2022   TSH 1.760 07/07/2019   INR 0.97 01/29/2018   HGBA1C 4.9 07/07/2019    Assessment & Plan:   Problem List Items Addressed This Visit       Cardiovascular and Mediastinum   Chronic systolic CHF (congestive heart failure) (HCC)    eF 20- 25% last echo   Following cardiologist On coreg 18.7 mg BD, Entresto 97-103 mg BD, Jardiance 10 mg OD, inspra 50 mg OD, Lasix 20 mg as needed, Mexitil 200 mg BD Reinforced Daily exercise as tolerated and low sodium diet       Essential hypertension, benign - Primary    Well controlled Continue Coreg 18.75 twice daily Entresto 97-103 1 tablet twice a day Mevacor 20 mg once daily  Nutrition: Stressed importance of moderation in sodium intake, saturated fat and cholesterol, caloric balance, sufficient intake of complex carbohydrates, fiber, calcium and iron.   Exercise: Stressed the importance of regular exercise.           Relevant Orders   CBC with Differential/Platelet   Hemoglobin A1c   TSH   T3, Free   Lipid Panel     Other   ICD (implantable cardioverter-defibrillator) in place    Rate controlled Taking Mexitil 200mg  BD Following Dr.Kein.      Mixed hyperlipidemia   Relevant Orders   CBC with Differential/Platelet  Hemoglobin A1c   TSH   T3, Free   Lipid Panel   Encounter for prostate cancer screening   Relevant Orders   PSA   Refused influenza vaccine   Refused Covid Vaccine.  Follow-up:  as needed  I, Almer Bushey have reviewed all documentation for this visit. The documentation on 03/24/22   for the exam, diagnosis, procedures, and orders are all accurate and complete.      An After Visit Summary was printed and given to the patient.  Neil Crouch, DNP, [place] [phone]

## 2022-03-24 NOTE — Assessment & Plan Note (Addendum)
Rate controlled Taking Mexitil 200mg  BD Following Dr.Kein.

## 2022-03-24 NOTE — Assessment & Plan Note (Signed)
eF 20- 25% last echo   Following cardiologist On coreg 18.7 mg BD, Entresto 97-103 mg BD, Jardiance 10 mg OD, inspra 50 mg OD, Lasix 20 mg as needed, Mexitil 200 mg BD Reinforced Daily exercise as tolerated and low sodium diet

## 2022-03-24 NOTE — Assessment & Plan Note (Addendum)
Well controlled Continue Coreg 18.75 twice daily Entresto 97-103 1 tablet twice a day Mevacor 20 mg once daily  Nutrition: Stressed importance of moderation in sodium intake, saturated fat and cholesterol, caloric balance, sufficient intake of complex carbohydrates, fiber, calcium and iron.   Exercise: Stressed the importance of regular exercise.

## 2022-03-25 LAB — LIPID PANEL
Chol/HDL Ratio: 3.4 ratio (ref 0.0–5.0)
Cholesterol, Total: 163 mg/dL (ref 100–199)
HDL: 48 mg/dL (ref >39)
LDL Chol Calc (NIH): 95 mg/dL (ref 0–99)
Triglycerides: 112 mg/dL (ref 0–149)
VLDL Cholesterol Cal: 20 mg/dL (ref 5–40)

## 2022-03-25 LAB — CBC WITH DIFFERENTIAL/PLATELET
Basophils Absolute: 0.1 10*3/uL (ref 0.0–0.2)
Basos: 2 %
EOS (ABSOLUTE): 0.3 10*3/uL (ref 0.0–0.4)
Eos: 4 %
Hematocrit: 49 % (ref 37.5–51.0)
Hemoglobin: 16.2 g/dL (ref 13.0–17.7)
Immature Grans (Abs): 0 10*3/uL (ref 0.0–0.1)
Immature Granulocytes: 0 %
Lymphocytes Absolute: 1.7 10*3/uL (ref 0.7–3.1)
Lymphs: 19 %
MCH: 28.9 pg (ref 26.6–33.0)
MCHC: 33.1 g/dL (ref 31.5–35.7)
MCV: 88 fL (ref 79–97)
Monocytes Absolute: 0.9 10*3/uL (ref 0.1–0.9)
Monocytes: 9 %
Neutrophils Absolute: 6.1 10*3/uL (ref 1.4–7.0)
Neutrophils: 66 %
Platelets: 228 10*3/uL (ref 150–450)
RBC: 5.6 x10E6/uL (ref 4.14–5.80)
RDW: 11.4 % — ABNORMAL LOW (ref 11.6–15.4)
WBC: 9.1 10*3/uL (ref 3.4–10.8)

## 2022-03-25 LAB — TSH: TSH: 1.02 u[IU]/mL (ref 0.450–4.500)

## 2022-03-25 LAB — T3, FREE: T3, Free: 3 pg/mL (ref 2.0–4.4)

## 2022-03-25 LAB — CARDIOVASCULAR RISK ASSESSMENT

## 2022-03-25 LAB — PSA: Prostate Specific Ag, Serum: 2 ng/mL (ref 0.0–4.0)

## 2022-03-25 LAB — HEMOGLOBIN A1C
Est. average glucose Bld gHb Est-mCnc: 94 mg/dL
Hgb A1c MFr Bld: 4.9 % (ref 4.8–5.6)

## 2022-03-31 ENCOUNTER — Ambulatory Visit: Payer: No Typology Code available for payment source | Attending: Student | Admitting: Student

## 2022-03-31 ENCOUNTER — Encounter: Payer: Self-pay | Admitting: Student

## 2022-03-31 VITALS — BP 112/66 | HR 63 | Ht 68.0 in | Wt 186.0 lb

## 2022-03-31 DIAGNOSIS — G4733 Obstructive sleep apnea (adult) (pediatric): Secondary | ICD-10-CM

## 2022-03-31 DIAGNOSIS — I5022 Chronic systolic (congestive) heart failure: Secondary | ICD-10-CM | POA: Diagnosis not present

## 2022-03-31 DIAGNOSIS — I472 Ventricular tachycardia, unspecified: Secondary | ICD-10-CM | POA: Diagnosis not present

## 2022-03-31 DIAGNOSIS — I10 Essential (primary) hypertension: Secondary | ICD-10-CM

## 2022-03-31 LAB — CUP PACEART INCLINIC DEVICE CHECK
Battery Remaining Longevity: 38 mo
Battery Voltage: 2.96 V
Brady Statistic RV Percent Paced: 0.01 %
Date Time Interrogation Session: 20240112120818
HighPow Impedance: 73 Ohm
Implantable Lead Connection Status: 753985
Implantable Lead Implant Date: 20160829
Implantable Lead Location: 753860
Implantable Pulse Generator Implant Date: 20160829
Lead Channel Impedance Value: 437 Ohm
Lead Channel Impedance Value: 494 Ohm
Lead Channel Pacing Threshold Amplitude: 0.625 V
Lead Channel Pacing Threshold Pulse Width: 0.4 ms
Lead Channel Sensing Intrinsic Amplitude: 23.375 mV
Lead Channel Sensing Intrinsic Amplitude: 28.5 mV
Lead Channel Setting Pacing Amplitude: 2 V
Lead Channel Setting Pacing Pulse Width: 0.4 ms
Lead Channel Setting Sensing Sensitivity: 0.3 mV
Zone Setting Status: 755011

## 2022-03-31 NOTE — Patient Instructions (Signed)
Medication Instructions:  Your physician recommends that you continue on your current medications as directed. Please refer to the Current Medication list given to you today.  *If you need a refill on your cardiac medications before your next appointment, please call your pharmacy*  Lab Work: None If you have labs (blood work) drawn today and your tests are completely normal, you will receive your results only by: MyChart Message (if you have MyChart) OR A paper copy in the mail If you have any lab test that is abnormal or we need to change your treatment, we will call you to review the results.  Follow-Up: At [facility], you and your health needs are our priority.  As part of our continuing mission to provide you with exceptional heart care, we have created designated Provider Care Teams.  These Care Teams include your primary Cardiologist (physician) and Advanced Practice Providers (APPs -  Physician Assistants and Nurse Practitioners) who all work together to provide you with the care you need, when you need it.   Your next appointment:   6 month(s)  Provider:   Steven Klein, MD 

## 2022-03-31 NOTE — Progress Notes (Signed)
Electrophysiology Office Note Date: 03/31/2022  ID:  Brian Noble, DOB 1960-12-21, MRN [id number]  PCP: Neil Crouch, FNP Primary Cardiologist: None Electrophysiologist: Virl Axe, MD   CC: Routine ICD follow-up  Brian Noble is a 62 y.o. male seen today for Virl Axe, MD for routine electrophysiology followup. Since last being seen in our clinic the patient reports doing very well.  He did have an episode of syncope in 12/2021, not associated with tachy-arryhtmia. Was on using treadmill and reported poor oral intake.   With EF~20%, referred back to see [place] early for consideration of CRT upgrade, which has also been previously discussed.   Currently, pt states he is feeling great. He denies any SOB or fatigue with activities. He uses a stationary bike for 30-45 minutes most days at various resistance levels without issues. He denies chest pain, palpitations, dyspnea, PND, orthopnea, nausea, vomiting, dizziness, syncope, edema, weight gain, or early satiety.  He would prefer to avoid procedures as he is not having symptoms at this time.   He has not had ICD shocks.   Appropriate Therapy yes  8/20 VT/VFl (CL 220 msec>> failed ATP + shock---- dirty break with VT NS )  Inappropriate Therapy  Antiarrhythmics Date   mexiletine  8/20           DATE TEST EF    11/16    Echo 20 %    12/17    Echo 30 %    10/19 Echo  20-25%    11/19 LHC/RHC   No Obs CAD Normal filling press  7/21 Echo 25-30%    9/23 Echo 20%     Device History: Medtronic Single Chamber ICD implanted 2016 for aborted cardiac arrest History of appropriate therapy: Yes History of AAD therapy: Yes; currently on mexitil    Past Medical History:  Diagnosis Date   Arthritis    Chronic kidney disease    Chronic systolic CHF (congestive heart failure) ([place])    a. Echo 8/16:  EF 10%, diff HK, Gr 1 DD, mild dilated aortic root, mild MR, mild LAE, severely reduced RVSF   Hypertension    ICD (implantable  cardioverter-defibrillator) in place    NICM (nonischemic cardiomyopathy) ([place])    a. LHC 8/16:  normal coronary arteries.    VF (ventricular fibrillation) ([facility])    s/p OOH cardiac arrest 8/16 >> s/p ICD    Current Outpatient Medications  Medication Instructions   albuterol (VENTOLIN HFA) 108 (90 Base) MCG/ACT inhaler 2 puffs, Inhalation, Every 6 hours PRN   aspirin EC 81 mg, Oral, Daily   carvedilol (COREG) 18.75 mg, Oral, 2 times daily   colchicine 0.6 MG tablet TAKE 1 TABLET (0.6 MG TOTAL) BY MOUTH DAILY FOR 7 DAYS.   empagliflozin (JARDIANCE) 10 mg, Oral, Daily   ENTRESTO 97-103 MG 1 tablet, Oral, 2 times daily   eplerenone (INSPRA) 50 mg, Oral, Daily   furosemide (LASIX) 40 mg, Oral, As needed   LOKELMA 10 g PACK packet MIX AND TAKE 10 GRAMS BY MOUTH DAILY.   lovastatin (MEVACOR) 20 MG tablet TAKE 1 TABLET BY MOUTH EVERY DAY   magnesium oxide (MAG-OX) 400 mg, Oral, 2 times daily   mexiletine (MEXITIL) 200 MG capsule TAKE 1 CAPSULE BY MOUTH TWICE A DAY   sildenafil (REVATIO) 20 MG tablet TAKE 1/2 TABLET BY MOUTH DAILY AS NEEDED FOR ERECTILE DYSFUNCTION    Family History: Family History  Problem Relation Age of Onset   Hypertension Father  Sudden death Father    Hypertension Mother    Heart disease Sister    Heart disease Sister    Hypertension Sister    Hypertension Sister    Hypertension Brother    Hypertension Brother    Hypertension Brother    Hypertension Brother    Hypertension Sister     Physical Exam: Vitals:   03/31/22 1136  BP: 112/66  Pulse: 63  SpO2: 97%  Weight: 186 lb (84.4 kg)  Height: 5\' 8"  (1.727 m)     GEN- The patient is well appearing, alert and oriented x 3 today.   HEENT: normocephalic, atraumatic Lungs- Clear to ausculation bilaterally, normal work of breathing.  Heart- Regular rate and rhythm rate and rhythm, no murmurs, rubs or gallops Extremities- No peripheral edema. no clubbing or cyanosis Skin- warm and dry, no rash or lesion;  ICD pocket well healed Psych- euthymic mood, full affect  ICD interrogation- reviewed in detail today,  See PACEART report  EKG is not ordered today. EKG from 01/03/2022 reviewed which showed IVCD/LBBB-like at 148 ms  Other studies Reviewed: Additional studies/ records that were reviewed today include: Previous EP office notes.   Assessment and Plan:  Aborted cardiac arrest   Ventricular tachycardia/flutter with appropriate therapy 8/20 8/21, 2/22, 3/23   Ventricular tachycardia-nonsustained   Nonischemic cardiomyopathy   IVCD   Eplerenone intolerance secondary to hyperkalemia   Implantable defibrillator-Medtronic  The patient's device was interrogated.    No further recurrent VT.   Given EF 20% on Echo 11/2021 and syncope in October (though no arryhtmia) asked to see again to consider CRT.   In the past Dr. [name] has described his IVCD and "Left bundle like" and it is not felt CRT would provide additional benefit to the pt at this juncture.   Of note, today pt denies any symptoms of SOB or fatigue, exercising for 30-45 minutes most days.  With lack of symptoms, pt would also prefer to avoid procedures.   Current medicines are reviewed at length with the patient today.    Disposition:   Follow up with Dr. [name] in 6 months. Sooner with issues.    Jacalyn Lefevre, PA-C  03/31/2022 11:39 AM  [facility] [address] [place] [address] [phone] (office) [phone] (fax)

## 2022-04-05 ENCOUNTER — Ambulatory Visit: Payer: 59 | Attending: Internal Medicine

## 2022-04-05 DIAGNOSIS — I428 Other cardiomyopathies: Secondary | ICD-10-CM | POA: Diagnosis not present

## 2022-04-06 LAB — CUP PACEART REMOTE DEVICE CHECK
Battery Remaining Longevity: 38 mo
Battery Voltage: 2.96 V
Brady Statistic RV Percent Paced: 0.01 %
Date Time Interrogation Session: 20240117012402
HighPow Impedance: 73 Ohm
Implantable Lead Connection Status: 753985
Implantable Lead Implant Date: 20160829
Implantable Lead Location: 753860
Implantable Pulse Generator Implant Date: 20160829
Lead Channel Impedance Value: 437 Ohm
Lead Channel Impedance Value: 494 Ohm
Lead Channel Pacing Threshold Amplitude: 0.625 V
Lead Channel Pacing Threshold Pulse Width: 0.4 ms
Lead Channel Sensing Intrinsic Amplitude: 21.375 mV
Lead Channel Sensing Intrinsic Amplitude: 21.375 mV
Lead Channel Setting Pacing Amplitude: 2 V
Lead Channel Setting Pacing Pulse Width: 0.4 ms
Lead Channel Setting Sensing Sensitivity: 0.3 mV
Zone Setting Status: 755011

## 2022-04-20 ENCOUNTER — Other Ambulatory Visit (HOSPITAL_COMMUNITY): Payer: Self-pay

## 2022-04-20 ENCOUNTER — Telehealth (HOSPITAL_COMMUNITY): Payer: Self-pay | Admitting: Vascular Surgery

## 2022-04-20 DIAGNOSIS — I5022 Chronic systolic (congestive) heart failure: Secondary | ICD-10-CM

## 2022-04-20 NOTE — Telephone Encounter (Signed)
LVM TO MAKE CPX APPT

## 2022-04-26 ENCOUNTER — Encounter (HOSPITAL_COMMUNITY): Payer: Self-pay | Admitting: Cardiology

## 2022-04-27 ENCOUNTER — Encounter: Payer: Self-pay | Admitting: Nurse Practitioner

## 2022-04-27 NOTE — Progress Notes (Signed)
Remote ICD transmission.   

## 2022-04-28 ENCOUNTER — Encounter: Payer: Self-pay | Admitting: Cardiology

## 2022-05-31 ENCOUNTER — Other Ambulatory Visit (HOSPITAL_COMMUNITY): Payer: Self-pay | Admitting: Cardiology

## 2022-06-01 ENCOUNTER — Telehealth (HOSPITAL_COMMUNITY): Payer: Self-pay | Admitting: Cardiology

## 2022-06-06 ENCOUNTER — Encounter (HOSPITAL_COMMUNITY): Payer: Self-pay | Admitting: Cardiology

## 2022-06-09 ENCOUNTER — Encounter (HOSPITAL_COMMUNITY): Payer: No Typology Code available for payment source | Admitting: Cardiology

## 2022-06-22 ENCOUNTER — Encounter (HOSPITAL_COMMUNITY): Payer: 59

## 2022-06-23 ENCOUNTER — Encounter (HOSPITAL_COMMUNITY): Payer: No Typology Code available for payment source | Admitting: Cardiology

## 2022-07-05 ENCOUNTER — Ambulatory Visit (INDEPENDENT_AMBULATORY_CARE_PROVIDER_SITE_OTHER): Payer: 59

## 2022-07-05 DIAGNOSIS — I428 Other cardiomyopathies: Secondary | ICD-10-CM | POA: Diagnosis not present

## 2022-07-06 LAB — CUP PACEART REMOTE DEVICE CHECK
Battery Remaining Longevity: 38 mo
Battery Voltage: 2.96 V
Brady Statistic RV Percent Paced: 0.01 %
Date Time Interrogation Session: 20240417223725
HighPow Impedance: 80 Ohm
Implantable Lead Connection Status: 753985
Implantable Lead Implant Date: 20160829
Implantable Lead Location: 753860
Implantable Pulse Generator Implant Date: 20160829
Lead Channel Impedance Value: 380 Ohm
Lead Channel Impedance Value: 494 Ohm
Lead Channel Pacing Threshold Amplitude: 0.625 V
Lead Channel Pacing Threshold Pulse Width: 0.4 ms
Lead Channel Sensing Intrinsic Amplitude: 25.625 mV
Lead Channel Sensing Intrinsic Amplitude: 25.625 mV
Lead Channel Setting Pacing Amplitude: 2 V
Lead Channel Setting Pacing Pulse Width: 0.4 ms
Lead Channel Setting Sensing Sensitivity: 0.3 mV
Zone Setting Status: 755011

## 2022-07-11 ENCOUNTER — Other Ambulatory Visit (HOSPITAL_COMMUNITY): Payer: Self-pay

## 2022-07-11 ENCOUNTER — Other Ambulatory Visit (HOSPITAL_COMMUNITY): Payer: Self-pay | Admitting: Cardiology

## 2022-07-11 MED ORDER — LOKELMA 5 G PO PACK: 10.0000 g | PACK | Freq: Every day | ORAL | 0 refills | Status: DC

## 2022-07-21 ENCOUNTER — Encounter (HOSPITAL_COMMUNITY): Payer: Self-pay | Admitting: Cardiology

## 2022-07-21 ENCOUNTER — Ambulatory Visit (HOSPITAL_COMMUNITY)
Admission: RE | Admit: 2022-07-21 | Discharge: 2022-07-21 | Disposition: A | Discharge status: Home / Self Care | Payer: 59 | Source: Ambulatory Visit | Attending: Cardiology | Admitting: Cardiology

## 2022-07-21 VITALS — BP 92/58 | HR 56 | Wt 191.2 lb

## 2022-07-21 DIAGNOSIS — E875 Hyperkalemia: Secondary | ICD-10-CM | POA: Diagnosis not present

## 2022-07-21 DIAGNOSIS — I447 Left bundle-branch block, unspecified: Secondary | ICD-10-CM | POA: Diagnosis not present

## 2022-07-21 DIAGNOSIS — I110 Hypertensive heart disease with heart failure: Secondary | ICD-10-CM | POA: Diagnosis present

## 2022-07-21 DIAGNOSIS — G4733 Obstructive sleep apnea (adult) (pediatric): Secondary | ICD-10-CM | POA: Diagnosis not present

## 2022-07-21 DIAGNOSIS — R519 Headache, unspecified: Secondary | ICD-10-CM | POA: Diagnosis not present

## 2022-07-21 DIAGNOSIS — I5022 Chronic systolic (congestive) heart failure: Secondary | ICD-10-CM | POA: Insufficient documentation

## 2022-07-21 DIAGNOSIS — Z7984 Long term (current) use of oral hypoglycemic drugs: Secondary | ICD-10-CM | POA: Insufficient documentation

## 2022-07-21 DIAGNOSIS — I428 Other cardiomyopathies: Secondary | ICD-10-CM | POA: Diagnosis not present

## 2022-07-21 DIAGNOSIS — Z79899 Other long term (current) drug therapy: Secondary | ICD-10-CM | POA: Insufficient documentation

## 2022-07-21 LAB — BASIC METABOLIC PANEL
Anion gap: 10 (ref 5–15)
BUN: 13 mg/dL (ref 8–23)
CO2: 22 mmol/L (ref 22–32)
Calcium: 8.8 mg/dL — ABNORMAL LOW (ref 8.9–10.3)
Chloride: 106 mmol/L (ref 98–111)
Creatinine, Ser: 1.24 mg/dL (ref 0.61–1.24)
GFR, Estimated: >60 mL/min (ref >60)
Glucose, Bld: 90 mg/dL (ref 70–99)
Potassium: 4.8 mmol/L (ref 3.5–5.1)
Sodium: 138 mmol/L (ref 135–145)

## 2022-07-21 NOTE — Patient Instructions (Signed)
Good to see you today!  Labs done today, your results will be available in MyChart, we will contact you for abnormal readings.  Follow up lab work in 3 months  Your physician recommends that you schedule a follow-up appointment in: 6 months w echocardiogram(November) Call in September to schedule an appointment    Your physician has requested that you have an echocardiogram. Echocardiography is a painless test that uses sound waves to create images of your heart. It provides your doctor with information about the size and shape of your heart and how well your heart's chambers and valves are working. This procedure takes approximately one hour. There are no restrictions for this procedure. Please do NOT wear cologne, perfume, aftershave, or lotions (deodorant is allowed). Please arrive 15 minutes prior to your appointment time.   You are scheduled for a Cardiopulmonary Exercise (CPX) Test as [facility] on: Date:      Time:   Expect to be in the lab for 2 hours. Please plan to arrive 30 minutes prior to your appointment. You may be asked to reschedule your test if you arrive 20 minutes or more after your scheduled appointment time.  Main Campus address: [address] [place], [place] [address] You may arrive to the Main Entrance A or Entrance C (free valet parking is available at both). -Main Entrance A (on [address]) :proceed to admitting for check in -Entrance C (on [facility]): proceed to [employer] parking or under hospital deck parking using this code _________  Check In: [facility] waiting room (1st floor)   General Instructions for the day of the test (Please follow all instructions from your physician): Refrain from ingesting a heavy meal, alcohol, or caffeine or using tobacco products within 2 hours of the test (DO NOT FAST for mare than 8 hours). You may have all other non-alcoholic, non -caffeinated beverage,a light snack (crackers,a piece of fruit,  carrot sticks, toast bagel,etc) up to your appointment. Avoid significant exertion or exercise within 24 hours of your test. Be prepared to exercise and sweat. Your clothing should permit freedom of movement and include walking or running shoes. Women bring loose fitting short sleeved blouse.  This evaluation may be fatiguing and you may wish ti have someone accompany you to the assessment to drive you home afterward. Bring a list of your medications with you, including dosage and frequency you take the medications (  I.e.,once per day, twice per day, etc). Take all medications as prescribed, unless noted below or instructed to do so by your physician.  Please do not take the following medications prior to your CPX:  _________________________________________________  _________________________________________________  Brief description of the test: A brief lung test will be performed. This will involve you taking deep breaths and blowing hard and fast through your mouth. During these , a clip will be on your nose and you will be breathing through a breathing device.   For the exercise portion of the test you will be walking on a treadmill, or riding a stationary bike, to your maximal effor or until symptoms such as chest pain, shortness of breath, leg pain or dizziness limit your exercise. You will be breathing in and out of a breathing device through your mouth (a clip will be on your nose again). Your heart rate, ECG, blood pressure, oxygen saturations, breathing rate and depth, amount of oxygen you consume and amount of carbon dioxide you produce will be measured and monitored throughout the exercise  test.  If you need to cancel or reschedule your appointment please call [phone] If you have further questions please call your physician or Philip Aspen, MS, ACSM-RCEP at [phone]

## 2022-07-22 NOTE — Progress Notes (Signed)
Patient ID: Brian Noble, male   DOB: 03/06/1961, 62 y.o.   MRN: [id number]   PCP: Lurline Del, FNP (Inactive) Cardiology: Dr Shirlee Latch EP Dr Graciela Husbands   16 y.o. with a prior history of cardiomyopathy who developed a cardiac arrest and was found to have low EF and normal coronaries.  Per patient, he was found to have low EF around 20% about 8 years ago.  He was seen by a cardiologist in [place] (he thinks), and EF went back to normal range.  He had no problems up until 8/16.  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  He was noted to pass out by family.  EMS was called and arrived quickly.  He was in either ventricular fibrillation or VT and was defibrillated.  He was cooled and sent for coronary angiography, which showed no significant coronary disease.  Echo showed EF 10% with severe RV dysfunction.  After recovery, he got a Medtronic ICD.  Repeat echo in 11/16 showed EF 20% with septal-lateral dyssynchrony, moderately decreased RV systolic function.  Unable to take Bidil due to severe headaches.    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 Appropriate shock due to VT. ECHO completed and showed EF 20-25% and normal RV. Carvedilol increased to 18.75 mg twice a day. He was discharged on 12/20/17. He had follow up with [name] EP NP and carvedilol was increased to 25 mg twice a day.   RHC/LHC was done in 11/19, showing no significant CAD and optimized filling pressures with preserved CI 2.31. CPX in 11/19 showed deconditioning but no significant cardiac limitation.    Echo in 7/21 showed EF 25-30%, mildly decreased RV systolic function. He had VT with ATP in 2/22, now on mexiletine.  He had VT again in 3/23 with ICD shock.  Zio monitor (4/23) showed rare PVCs, 40 short NSVT runs.    In 8/23, it looks like he had a VT run again terminated by ATP based on device interrogation.   Echo in 9/23 showed EF 20%, global hypokinesis, mildly decreased RV systolic function.   Follow up  10/23, had an episode where he passed out transiently (in the setting of poor po intake and being on a treadmill). No events on device interrogation. Referred to EP to discuss CRT-D.  EP held off again with minimal symptoms.   Today he returns for HF follow up.  Taking all meds.  Rides exercise bike and continues to work full time. No orthopnea/PND.  No significant exertional dyspnea.  No chest pain.  He has not used any Lasix.  No lightheadedness, palpitations.  Weight down 1 lb.    Medtronic device interrogation: No AF/VT, stable thoracic impedance.   ECG (personally reviewed): NSR, IVCD 152 msec  Labs (4/17): TSH normal Labs (5/17): K 4, creatinine 1.13, BNP 187, HCT 45.1 Labs (10/17): K 4.3, creatinine 1.37 Labs (12/19/2017): K 4.7 Creatinine 1/29 Mag 2.  Labs (11/19): K 5.1 Creatinine 1.11 BNP 244 Hgb 14.6 Labs (1/23): K 4.6, creatinine 1.35 Labs (3/23): K 4.8, creatinine 1.23 Labs (9/23): LDL 66, BNP 234 Labs (10/23): K 3.8, creatinine 1.21 Labs (12/23): k 4.6, creatinine 1.07 Labs (1/24): LDL 95   PMH: 1. LBBB/IVCD 2. HTN: x years 3. Hyperlipidemia 4. Chronic systolic CHF: Patient was told around 2008 that his EF was about 20%.  He says that it recovered back to normal.  He thinks he may have seen a cardiologist in [place] at that time. On 11/10/14, he was  admitted after having ventricular fibrillation versus VT arrest and being shocked by AED.   - LHC (8/16): Normal coronaries, EF < 25%. - Echo (8/16): EF 10%, diffuse hypokinesis, mild MR, severely decreased RV systolic function.  - Medtronic ICD (8/16).   - Echo (11/16): EF 20%, septal-lateral dyssynchrony, mildly dilated LV with mild LVH, moderately decreased RV systolic function.  - CPX (3/17): Peak VO2 23.1 (69% predicted peak VO2) VE/VCO2 slope:  32 OUES: 2.00 Peak RER: 1.11, Ventilatory Threshold: 17.7 (53% predicted or measured peak VO2) => Mild functional impairment. - Echo (12/17): EF 30%, mild LV dilation,  septal-lateral dyssynchrony, mild to moderately decreased RV systolic function.  - ECHO (10/19): EF 20-25%.  - LHC/RHC (11/19): No significant coronary disease; RA mean 3, PA 24/10, PCWP mean 7, Cardiac Index (Fick) 2.31.  - CPX (11/19): Submaximal with RER 0.8, VE/VCO2 slope 29, peak VO2 15.1 => no significant cardiac limitation, appears deconditioned.  - Echo (7/21): EF 25-30%, mildly decreased RV systolic function - Echo (9/23): EF 20%, global hypokinesis, mildly decreased RV systolic function.  - [employer] gene testing for cardiomyopathy was negative.  5. Event monitor (4/17): No atrial fibrillation.  Runs NSVT noted.  6. VT 7. OSA: Severe, does not use CPAP.  8. Syncope 10/23: No VT recorded by device.    FH: Sister with heart transplant, father with cardiac arrest when about 20, 2 other sisters with "heart problems."  He has 10 siblings in all.   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  ROS: All systems reviewed and negative except as per HPI.   Current Outpatient Medications  Medication Sig Dispense Refill   albuterol (VENTOLIN HFA) 108 (90 Base) MCG/ACT inhaler Inhale 2 puffs into the lungs every 6 (six) hours as needed for wheezing or shortness of breath. 8.5 each 0   aspirin EC 81 MG tablet Take 81 mg by mouth daily.     carvedilol (COREG) 12.5 MG tablet TAKE 1.5 TABLETS (18.75 MG TOTAL) BY MOUTH 2 (TWO) TIMES DAILY. 270 tablet 3   empagliflozin (JARDIANCE) 10 MG TABS tablet Take 1 tablet (10 mg total) by mouth daily. 30 tablet 11   ENTRESTO 97-103 MG TAKE 1 TABLET BY MOUTH TWICE A DAY 60 tablet 11   eplerenone (INSPRA) 50 MG tablet Take 1 tablet (50 mg total) by mouth daily. 90 tablet 3   furosemide (LASIX) 20 MG tablet Take 2 tablets (40 mg total) by mouth as needed for fluid or edema. 60 tablet 11   lovastatin (MEVACOR) 20 MG tablet TAKE 1 TABLET BY MOUTH EVERY DAY 90 tablet 2   magnesium oxide (MAG-OX) 400 MG tablet Take 400 mg by  mouth 2 (two) times a day.      mexiletine (MEXITIL) 200 MG capsule TAKE 1 CAPSULE BY MOUTH TWICE A DAY 180 capsule 2   sildenafil (REVATIO) 20 MG tablet TAKE 1/2 TABLET BY MOUTH DAILY AS NEEDED FOR ERECTILE DYSFUNCTION 10 tablet 6   sodium zirconium cyclosilicate (LOKELMA) 5 g packet Take 5 g by mouth daily.     No current facility-administered medications for this encounter.   BP (!) 92/58   Pulse (!) 56   Wt 86.7 kg (191 lb 3.2 oz)   SpO2 95%   BMI 29.07 kg/m   Wt Readings from Last 3 Encounters:  07/21/22 86.7 kg (191 lb 3.2 oz)  03/31/22 84.4 kg (186 lb)  03/24/22 87.4 kg (192 lb 9.6 oz)  General: NAD Neck: No JVD, no thyromegaly or thyroid nodule.  Lungs: Clear to auscultation bilaterally with normal respiratory effort. CV: Nondisplaced PMI.  Heart regular S1/S2, no S3/S4, no murmur.  No peripheral edema.  No carotid bruit.  Normal pedal pulses.  Abdomen: Soft, nontender, no hepatosplenomegaly, no distention.  Skin: Intact without lesions or rashes.  Neurologic: Alert and oriented x 3.  Psych: Normal affect. Extremities: No clubbing or cyanosis.  HEENT: Normal.   Assessment/Plan: 1. Chronic systolic CHF: Nonischemic cardiomyopathy.  Medtronic ICD.  Possibly familial CMP given father with SCD at 5, sister with heart transplant, and 2 other sisters with "heart problems."  However, [employer] gene testing was negative for common familial cardiomyopathies.  Cannot rule out prior viral myocarditis or role for HTN.  Zio monitor in 4/23 showed only rare PVCs.  Echo 12/17 with EF 30%, septal-lateral dyssynchrony.  CPX in 3/17 with mild functional impairment. Echo in 10/19 with EF 20-25%.  CPX repeat 01/2018 with submaximal effort but minimal cardiac impairment, suggestive of deconditioning.  RHC in 11/19 showed optimized filling pressures with preserved cardiac index.  Echo in 7/21 with stable EF 25-30%.  Echo in 9/23 showed EF 20%.  NYHA class I by his report, not volume overloaded on  exam.  - Continue Coreg at 18.75 mg bid (Worsening fatigue and ED at higher doses). - Continue Entresto 97/103 bid.  BMET/BNP today.  - Continue Jardiance 10 mg daily.  - Continue eplerenone 50 mg daily.  - He will continue Lokelma 5 g daily to manage hyperkalemia.   - Unable to tolerate Bidil due to headaches.  - Patient has LBBB-like IVCD 152 msec.  He saw EP again and they decided to hold off device upgrade for BiV or LB pacing given minimal symptoms.  I will arrange for CPX; if he has a significant HF limitation, should have device upgrade.  - Repeat echo in 6 months with followup.  2. VT: Has Medtronic ICD.  VT with syncope in 10/19.  VT with ATP in 2/22, started on mexiletine. VT with ICD shock in 3/23.  VT with ATP in 8/23.   - Continue mexiletine.  3. OSA: Has refused to use CPAP.      Followup in 6 months with echo, will need BMET in 3 months.  Marca Ancona  07/22/2022

## 2022-07-29 ENCOUNTER — Other Ambulatory Visit: Payer: Self-pay | Admitting: Internal Medicine

## 2022-07-29 DIAGNOSIS — I472 Ventricular tachycardia, unspecified: Secondary | ICD-10-CM

## 2022-08-02 ENCOUNTER — Other Ambulatory Visit: Payer: Self-pay | Admitting: Cardiology

## 2022-08-02 DIAGNOSIS — I472 Ventricular tachycardia, unspecified: Secondary | ICD-10-CM

## 2022-08-07 ENCOUNTER — Other Ambulatory Visit (HOSPITAL_COMMUNITY): Payer: Self-pay | Admitting: Family Medicine

## 2022-08-07 NOTE — Progress Notes (Signed)
Remote ICD transmission.   

## 2022-08-24 ENCOUNTER — Other Ambulatory Visit: Payer: Self-pay | Admitting: Family Medicine

## 2022-09-11 ENCOUNTER — Other Ambulatory Visit: Payer: Self-pay | Admitting: Family Medicine

## 2022-09-11 DIAGNOSIS — F5221 Male erectile disorder: Secondary | ICD-10-CM

## 2022-09-15 ENCOUNTER — Encounter (HOSPITAL_COMMUNITY): Payer: 59

## 2022-09-22 ENCOUNTER — Ambulatory Visit (HOSPITAL_COMMUNITY): Payer: 59 | Attending: Cardiology

## 2022-09-22 DIAGNOSIS — I5022 Chronic systolic (congestive) heart failure: Secondary | ICD-10-CM

## 2022-09-29 ENCOUNTER — Ambulatory Visit: Payer: No Typology Code available for payment source | Admitting: Nurse Practitioner

## 2022-10-04 ENCOUNTER — Ambulatory Visit (INDEPENDENT_AMBULATORY_CARE_PROVIDER_SITE_OTHER): Payer: 59

## 2022-10-04 DIAGNOSIS — I428 Other cardiomyopathies: Secondary | ICD-10-CM | POA: Diagnosis not present

## 2022-10-05 LAB — CUP PACEART REMOTE DEVICE CHECK
Battery Remaining Longevity: 34 mo
Battery Voltage: 2.96 V
Brady Statistic RV Percent Paced: 0.09 %
Date Time Interrogation Session: 20240717001802
HighPow Impedance: 75 Ohm
Implantable Lead Connection Status: 753985
Implantable Lead Implant Date: 20160829
Implantable Lead Location: 753860
Implantable Pulse Generator Implant Date: 20160829
Lead Channel Impedance Value: 380 Ohm
Lead Channel Impedance Value: 456 Ohm
Lead Channel Pacing Threshold Amplitude: 0.75 V
Lead Channel Pacing Threshold Pulse Width: 0.4 ms
Lead Channel Sensing Intrinsic Amplitude: 25.875 mV
Lead Channel Sensing Intrinsic Amplitude: 25.875 mV
Lead Channel Setting Pacing Amplitude: 2 V
Lead Channel Setting Pacing Pulse Width: 0.4 ms
Lead Channel Setting Sensing Sensitivity: 0.3 mV
Zone Setting Status: 755011

## 2022-10-19 NOTE — Progress Notes (Signed)
Remote ICD transmission.   

## 2022-10-20 ENCOUNTER — Ambulatory Visit (HOSPITAL_COMMUNITY)
Admission: RE | Admit: 2022-10-20 | Discharge: 2022-10-20 | Disposition: A | Discharge status: Home / Self Care | Payer: 59 | Source: Ambulatory Visit | Attending: Family Medicine | Admitting: Family Medicine

## 2022-10-20 DIAGNOSIS — I5022 Chronic systolic (congestive) heart failure: Secondary | ICD-10-CM | POA: Insufficient documentation

## 2022-10-20 LAB — BASIC METABOLIC PANEL
Anion gap: 8 (ref 5–15)
BUN: 19 mg/dL (ref 8–23)
CO2: 24 mmol/L (ref 22–32)
Calcium: 8.7 mg/dL — ABNORMAL LOW (ref 8.9–10.3)
Chloride: 104 mmol/L (ref 98–111)
Creatinine, Ser: 1.22 mg/dL (ref 0.61–1.24)
GFR, Estimated: >60 mL/min (ref >60)
Glucose, Bld: 86 mg/dL (ref 70–99)
Potassium: 4 mmol/L (ref 3.5–5.1)
Sodium: 136 mmol/L (ref 135–145)

## 2022-10-30 ENCOUNTER — Other Ambulatory Visit: Payer: Self-pay

## 2022-10-30 DIAGNOSIS — E782 Mixed hyperlipidemia: Secondary | ICD-10-CM

## 2022-10-30 MED ORDER — LOVASTATIN 20 MG PO TABS
20.0000 mg | ORAL_TABLET | Freq: Every day | ORAL | 1 refills | Status: DC
Start: 2022-10-30 — End: 2023-02-19

## 2022-11-07 ENCOUNTER — Other Ambulatory Visit (HOSPITAL_COMMUNITY): Payer: Self-pay | Admitting: Cardiology

## 2022-11-23 NOTE — Progress Notes (Unsigned)
Subjective:  Patient ID: Brian Noble, male    DOB: 30-Dec-1960  Age: 62 y.o. MRN: [id number]  Chief Complaint  Patient presents with   Medical Management of Chronic Issues    HPI HTN/CHF: Prior history of cardiomyopathy, who developed a cardiac arrest. Low EF around 20% about 8 years ago. No significant CORONARY ARTERY DISEASE ON LHC.  Recurrent episodes of VT. Has Defibrillator.  Followed by Dr. Shirlee Latch and Dr. Graciela Husbands. Taking coreg 12.5 mg 1 1/2 twice daily, Entresto 97-103 twice daily, Inspra 50 mg daily, mexitil 200 mg twice daily, Jardiance 10 mg  every day   High Cholesterol: Lovastatin 20 mg daily     11/24/2022    8:58 AM 07/15/2020    9:12 AM 08/04/2019    8:03 AM  Depression screen PHQ 2/9  Decreased Interest 0 0 0  Down, Depressed, Hopeless 0 0 0  PHQ - 2 Score 0 0 0  Altered sleeping 0    Tired, decreased energy 0    Change in appetite 0    Feeling bad or failure about yourself  0    Trouble concentrating 0    Moving slowly or fidgety/restless 0    Suicidal thoughts 0    PHQ-9 Score 0    Difficult doing work/chores Not difficult at all          11/24/2022    8:57 AM  Fall Risk   Falls in the past year? 0  Number falls in past yr: 0  Injury with Fall? 0  Risk for fall due to : No Fall Risks  Follow up Falls prevention discussed    Patient Care Team: Windell Moment, MD as PCP - General (Family Medicine) Laurey Morale, MD as PCP - Advanced Heart Failure (Cardiology) [name], MD as PCP - Electrophysiology (Cardiology)   Review of Systems  Constitutional:  Negative for chills, diaphoresis, fatigue and fever.  HENT:  Negative for congestion, ear pain and sore throat.   Respiratory:  Negative for cough and shortness of breath.   Cardiovascular:  Negative for chest pain and leg swelling.  Gastrointestinal:  Negative for abdominal pain, constipation, diarrhea, nausea and vomiting.  Genitourinary:  Negative for dysuria and urgency.  Musculoskeletal:   Negative for arthralgias and myalgias.  Neurological:  Negative for dizziness and headaches.  Psychiatric/Behavioral:  Negative for dysphoric mood.     Current Outpatient Medications on File Prior to Visit  Medication Sig Dispense Refill   aspirin EC 81 MG tablet Take 81 mg by mouth daily.     ENTRESTO 97-103 MG TAKE 1 TABLET BY MOUTH TWICE A DAY 60 tablet 11   eplerenone (INSPRA) 50 MG tablet Take 1 tablet (50 mg total) by mouth daily. 90 tablet 3   furosemide (LASIX) 20 MG tablet Take 2 tablets (40 mg total) by mouth as needed for fluid or edema. 60 tablet 11   JARDIANCE 10 MG TABS tablet TAKE 1 TABLET BY MOUTH EVERY DAY 30 tablet 11   lovastatin (MEVACOR) 20 MG tablet Take 1 tablet (20 mg total) by mouth daily. 90 tablet 1   magnesium oxide (MAG-OX) 400 MG tablet Take 400 mg by mouth 2 (two) times a day.      mexiletine (MEXITIL) 200 MG capsule TAKE 1 CAPSULE BY MOUTH TWICE A DAY 180 capsule 2   sildenafil (REVATIO) 20 MG tablet TAKE 1/2 TABLET BY MOUTH DAILY AS NEEDED FOR ERECTILE DYSFUNCTION 10 tablet 6   sodium zirconium cyclosilicate (LOKELMA)  5 g packet MIX AND TAKE 10 GRAMS BY MOUTH DAILY. 30 packet 11   carvedilol (COREG) 12.5 MG tablet TAKE 1.5 TABLETS (18.75 MG TOTAL) BY MOUTH 2 (TWO) TIMES DAILY. 270 tablet 3   No current facility-administered medications on file prior to visit.   Past Medical History:  Diagnosis Date   Arthritis    Chronic kidney disease    Chronic systolic CHF (congestive heart failure) (HCC)    a. Echo 8/16:  EF 10%, diff HK, Gr 1 DD, mild dilated aortic root, mild MR, mild LAE, severely reduced RVSF   Hypertension    ICD (implantable cardioverter-defibrillator) in place    NICM (nonischemic cardiomyopathy) (HCC)    a. LHC 8/16:  normal coronary arteries.    VF (ventricular fibrillation) (HCC)    s/p OOH cardiac arrest 8/16 >> s/p ICD   Past Surgical History:  Procedure Laterality Date   APPENDECTOMY     CARDIAC CATHETERIZATION N/A 11/10/2014    Procedure: Left Heart Cath and Coronary Angiography;  Surgeon: [name], MD;  Location: [facility] INVASIVE CV LAB;  Service: Cardiovascular;  Laterality: N/A;   EP IMPLANTABLE DEVICE N/A 11/16/2014   Procedure: ICD Implant;  Surgeon: [name], MD;  Location: [facility] INVASIVE CV LAB;  Service: Cardiovascular;  Laterality: N/A;   RIGHT HEART CATH AND CORONARY ANGIOGRAPHY N/A 01/30/2018   Procedure: RIGHT HEART CATH AND CORONARY ANGIOGRAPHY;  Surgeon: Laurey Morale, MD;  Location: [facility] INVASIVE CV LAB;  Service: Cardiovascular;  Laterality: N/A;   ULTRASOUND GUIDANCE FOR VASCULAR ACCESS  01/30/2018   Procedure: Ultrasound Guidance For Vascular Access;  Surgeon: Laurey Morale, MD;  Location: [facility] INVASIVE CV LAB;  Service: Cardiovascular;;    Family History  Problem Relation Age of Onset   Hypertension Mother    Hypertension Father    Sudden death Father    Diabetes Sister    Heart disease Sister    Heart disease Sister    Hypertension Sister    Hypertension Sister    Hypertension Sister    Hypertension Brother    Hypertension Brother    Hypertension Brother    Hypertension Brother    Social History   Socioeconomic History   Marital status: Married    Spouse name: Not on file   Number of children: 2   Years of education: 12   Highest education level: 12th grade  Occupational History   Not on file  Tobacco Use   Smoking status: Never   Smokeless tobacco: Former    Types: Chew    Quit date: 2000  Vaping Use   Vaping status: Never Used  Substance and Sexual Activity   Alcohol use: No   Drug use: No   Sexual activity: Yes    Partners: Female  Other Topics Concern   Not on file  Social History Narrative   Not on file   Social Determinants of Health   Financial Resource Strain: Low Risk  (02/19/2018)   Overall Financial Resource Strain (CARDIA)    Difficulty of Paying Living Expenses: Not hard at all  Food Insecurity: No Food Insecurity (02/19/2018)   Hunger Vital Sign     Worried About Running Out of Food in the Last Year: Never true    Ran Out of Food in the Last Year: Never true  Transportation Needs: No Transportation Needs (02/19/2018)   PRAPARE - Administrator, Civil Service (Medical): No    Lack of Transportation (Non-Medical): No  Physical  Activity: Not on file  Stress: Not on file  Social Connections: Unknown (08/02/2021)   Received from [facility]   Social Network    Social Network: Not on file    Objective:  BP 110/70   Pulse (!) 58   Temp (!) 96 F (35.6 C)   Resp 14   Ht 5\' 8"  (1.727 m)   Wt 193 lb (87.5 kg)   SpO2 98%   BMI 29.35 kg/m      11/24/2022    8:53 AM 07/21/2022    8:57 AM 03/31/2022   11:36 AM  BP/Weight  Systolic BP 110 92 112  Diastolic BP 70 58 66  Wt. (Lbs) 193 191.2 186  BMI 29.35 kg/m2 29.07 kg/m2 28.28 kg/m2    Physical Exam Vitals reviewed.  Constitutional:      Appearance: Normal appearance. He is normal weight.  Neck:     Vascular: No carotid bruit.  Cardiovascular:     Rate and Rhythm: Normal rate and regular rhythm.     Heart sounds: No murmur heard. Pulmonary:     Effort: Pulmonary effort is normal.     Breath sounds: Normal breath sounds.  Abdominal:     General: Abdomen is flat. Bowel sounds are normal.     Palpations: Abdomen is soft.     Tenderness: There is no abdominal tenderness.  Neurological:     Mental Status: He is alert and oriented to person, place, and time.  Psychiatric:        Mood and Affect: Mood normal.        Behavior: Behavior normal.     Diabetic Foot Exam - Simple   No data filed      Lab Results  Component Value Date   WBC 6.3 11/24/2022   HGB 15.9 11/24/2022   HCT 48.6 11/24/2022   PLT 221 11/24/2022   GLUCOSE 86 10/20/2022   CHOL 162 11/24/2022   TRIG 163 (H) 11/24/2022   HDL 43 11/24/2022   LDLCALC 91 11/24/2022   ALT 39 07/30/2020   AST 32 07/30/2020   NA 136 10/20/2022   K 4.0 10/20/2022   CL 104 10/20/2022    CREATININE 1.22 10/20/2022   BUN 19 10/20/2022   CO2 24 10/20/2022   TSH 1.020 03/24/2022   INR 0.97 01/29/2018   HGBA1C 4.9 03/24/2022      Assessment & Plan:    Mixed hyperlipidemia Assessment & Plan: Well controlled.  No changes to medicines. Lovastatin 20 mg daily  Continue to work on eating a healthy diet and exercise.  Labs drawn today.    Orders: -     Lipid panel  Chronic systolic CHF (congestive heart failure) (HCC) Assessment & Plan: Following cardiologist On coreg 12.5 mg twice daily,  Entresto 97-103 mg twice daily , Jardiance 10 mg daily, inspra 50 mg daily, Lasix 20 mg daily as needed. Reinforced Daily exercise as tolerated and low sodium diet.   Orders: -     CBC with Differential/Platelet -     Interpretation:  Encounter for hepatitis C screening test for low risk patient -     HCV Ab w Reflex to Quant PCR  V-tach [facility]) Assessment & Plan: Continue carvedilol 12.5 mg 1 1/2 oral twice daily and Mexitil 200 mg twice daily. Follows with Dr. Graciela Husbands   ICD (implantable cardioverter-defibrillator) in place  Hypertensive heart disease with chronic systolic congestive heart failure [facility]) Assessment & Plan: Following cardiologist On coreg 12.5 mg twice  daily,  Entresto 97-103 mg twice daily , Jardiance 10 mg daily, inspra 50 mg daily, Lasix 20 mg daily as needed. Reinforced Daily exercise as tolerated and low sodium diet.       No orders of the defined types were placed in this encounter.   Orders Placed This Encounter  Procedures   CBC with Differential/Platelet   Lipid panel   HCV Ab w Reflex to Quant PCR   Interpretation:     Follow-up: Return for awv.   I,Katherina A Bramblett,acting as a scribe for Blane Ohara, MD.,have documented all relevant documentation on the behalf of Blane Ohara, MD,as directed by  Blane Ohara, MD while in the presence of Blane Ohara, MD.   Clayborn Bigness I Leal-Borjas,acting as a scribe for Blane Ohara, MD.,have  documented all relevant documentation on the behalf of Blane Ohara, MD,as directed by  Blane Ohara, MD while in the presence of Blane Ohara, MD.    An After Visit Summary was printed and given to the patient.  Blane Ohara, MD [facility] [phone]

## 2022-11-24 ENCOUNTER — Ambulatory Visit: Payer: 59 | Admitting: Family Medicine

## 2022-11-24 ENCOUNTER — Encounter: Payer: Self-pay | Admitting: Family Medicine

## 2022-11-24 VITALS — BP 110/70 | HR 58 | Temp 96.0°F | Resp 14 | Ht 68.0 in | Wt 193.0 lb

## 2022-11-24 DIAGNOSIS — I5022 Chronic systolic (congestive) heart failure: Secondary | ICD-10-CM

## 2022-11-24 DIAGNOSIS — E782 Mixed hyperlipidemia: Secondary | ICD-10-CM

## 2022-11-24 DIAGNOSIS — I472 Ventricular tachycardia, unspecified: Secondary | ICD-10-CM

## 2022-11-24 DIAGNOSIS — Z1159 Encounter for screening for other viral diseases: Secondary | ICD-10-CM | POA: Diagnosis not present

## 2022-11-24 DIAGNOSIS — I110 Hypertensive heart disease with heart failure: Secondary | ICD-10-CM

## 2022-11-24 DIAGNOSIS — I10 Essential (primary) hypertension: Secondary | ICD-10-CM

## 2022-11-24 DIAGNOSIS — Z95810 Presence of automatic (implantable) cardiac defibrillator: Secondary | ICD-10-CM

## 2022-11-25 LAB — CBC WITH DIFFERENTIAL/PLATELET
Basophils Absolute: 0.1 10*3/uL (ref 0.0–0.2)
Basos: 2 %
EOS (ABSOLUTE): 0.7 10*3/uL — ABNORMAL HIGH (ref 0.0–0.4)
Eos: 11 %
Hematocrit: 48.6 % (ref 37.5–51.0)
Hemoglobin: 15.9 g/dL (ref 13.0–17.7)
Immature Grans (Abs): 0 10*3/uL (ref 0.0–0.1)
Immature Granulocytes: 0 %
Lymphocytes Absolute: 1.6 10*3/uL (ref 0.7–3.1)
Lymphs: 26 %
MCH: 29 pg (ref 26.6–33.0)
MCHC: 32.7 g/dL (ref 31.5–35.7)
MCV: 89 fL (ref 79–97)
Monocytes Absolute: 0.7 10*3/uL (ref 0.1–0.9)
Monocytes: 11 %
Neutrophils Absolute: 3.2 10*3/uL (ref 1.4–7.0)
Neutrophils: 50 %
Platelets: 221 10*3/uL (ref 150–450)
RBC: 5.49 x10E6/uL (ref 4.14–5.80)
RDW: 11.8 % (ref 11.6–15.4)
WBC: 6.3 10*3/uL (ref 3.4–10.8)

## 2022-11-25 LAB — LIPID PANEL
Chol/HDL Ratio: 3.8 ratio (ref 0.0–5.0)
Cholesterol, Total: 162 mg/dL (ref 100–199)
HDL: 43 mg/dL (ref >39)
LDL Chol Calc (NIH): 91 mg/dL (ref 0–99)
Triglycerides: 163 mg/dL — ABNORMAL HIGH (ref 0–149)
VLDL Cholesterol Cal: 28 mg/dL (ref 5–40)

## 2022-11-25 LAB — HCV INTERPRETATION

## 2022-11-25 LAB — HCV AB W REFLEX TO QUANT PCR: HCV Ab: NONREACTIVE

## 2022-11-25 NOTE — Assessment & Plan Note (Signed)
Following cardiologist On coreg 18.7 mg BD, Entresto 97-103 mg BD, Jardiance 10 mg OD, inspra 50 mg OD, Lasix 20 mg as needed, Mexitil 200 mg BD Reinforced Daily exercise as tolerated and low sodium diet

## 2022-11-25 NOTE — Assessment & Plan Note (Signed)
Well controlled.  No changes to medicines. Lovastatin 20 mg daily  Continue to work on eating a healthy diet and exercise.  Labs drawn today.

## 2022-11-25 NOTE — Assessment & Plan Note (Signed)
Well controlled Continue Coreg 18.75 twice daily Entresto 97-103 1 tablet twice a day Mevacor 20 mg once daily

## 2022-11-26 DIAGNOSIS — Z1159 Encounter for screening for other viral diseases: Secondary | ICD-10-CM | POA: Insufficient documentation

## 2022-11-26 DIAGNOSIS — I472 Ventricular tachycardia, unspecified: Secondary | ICD-10-CM | POA: Insufficient documentation

## 2022-11-26 NOTE — Assessment & Plan Note (Signed)
Continue carvedilol 12.5 mg 1 1/2 oral twice daily and Mexitil 200 mg twice daily. Follows with Dr. Graciela Husbands

## 2023-01-03 ENCOUNTER — Ambulatory Visit (INDEPENDENT_AMBULATORY_CARE_PROVIDER_SITE_OTHER): Payer: 59

## 2023-01-03 DIAGNOSIS — I428 Other cardiomyopathies: Secondary | ICD-10-CM

## 2023-01-03 DIAGNOSIS — I5022 Chronic systolic (congestive) heart failure: Secondary | ICD-10-CM | POA: Diagnosis not present

## 2023-01-04 LAB — CUP PACEART REMOTE DEVICE CHECK
Battery Remaining Longevity: 35 mo
Battery Voltage: 2.95 V
Brady Statistic RV Percent Paced: 0.04 %
Date Time Interrogation Session: 20241016223726
HighPow Impedance: 69 Ohm
Implantable Lead Connection Status: 753985
Implantable Lead Implant Date: 20160829
Implantable Lead Location: 753860
Implantable Pulse Generator Implant Date: 20160829
Lead Channel Impedance Value: 380 Ohm
Lead Channel Impedance Value: 456 Ohm
Lead Channel Pacing Threshold Amplitude: 0.625 V
Lead Channel Pacing Threshold Pulse Width: 0.4 ms
Lead Channel Sensing Intrinsic Amplitude: 25.5 mV
Lead Channel Sensing Intrinsic Amplitude: 25.5 mV
Lead Channel Setting Pacing Amplitude: 2 V
Lead Channel Setting Pacing Pulse Width: 0.4 ms
Lead Channel Setting Sensing Sensitivity: 0.3 mV
Zone Setting Status: 755011

## 2023-01-11 ENCOUNTER — Telehealth: Payer: Self-pay

## 2023-01-11 NOTE — Telephone Encounter (Signed)
Patient called, requesting handicapped placcard to be filled out. Patient states due to his heart and being short of breath when walking. Is it ok to fill out?

## 2023-01-22 NOTE — Progress Notes (Signed)
Remote ICD transmission.   

## 2023-02-01 ENCOUNTER — Other Ambulatory Visit (HOSPITAL_COMMUNITY): Payer: Self-pay | Admitting: Family Medicine

## 2023-02-14 ENCOUNTER — Other Ambulatory Visit (HOSPITAL_COMMUNITY): Payer: Self-pay | Admitting: Cardiology

## 2023-02-19 ENCOUNTER — Other Ambulatory Visit: Payer: Self-pay | Admitting: Family Medicine

## 2023-02-19 DIAGNOSIS — E782 Mixed hyperlipidemia: Secondary | ICD-10-CM

## 2023-03-20 ENCOUNTER — Other Ambulatory Visit (HOSPITAL_COMMUNITY): Payer: Self-pay | Admitting: Cardiology

## 2023-03-21 ENCOUNTER — Encounter (HOSPITAL_COMMUNITY): Payer: Self-pay | Admitting: Cardiology

## 2023-03-22 ENCOUNTER — Encounter (HOSPITAL_COMMUNITY): Payer: Self-pay

## 2023-03-22 ENCOUNTER — Other Ambulatory Visit (HOSPITAL_COMMUNITY): Payer: Self-pay

## 2023-03-22 ENCOUNTER — Telehealth (HOSPITAL_COMMUNITY): Payer: Self-pay | Admitting: Pharmacy Technician

## 2023-03-22 NOTE — Telephone Encounter (Signed)
 Patient Advocate Encounter   Received notification from [employer] that prior authorization for [facility] is required.   PA submitted on CoverMyMeds Key  [id number] Status is pending   Will continue to follow.

## 2023-03-22 NOTE — Telephone Encounter (Signed)
 Advanced Heart Failure Patient Advocate Encounter  Prior Authorization for Lokelma  has been approved.    PA# [id number] Effective dates: 03/22/23 through 03/21/24  Patients co-pay is $80 (30 days) co-pay card will bring co-pay to $0. Called and spoke with the patient. Sent link to co-pay card via mychart.  Almarie JULIANNA Pa, CPhT

## 2023-03-23 MED ORDER — LOKELMA 10 G PO PACK: 10.0000 g | PACK | Freq: Every day | ORAL | 11 refills | Status: DC

## 2023-03-23 NOTE — Addendum Note (Signed)
 Addended by: Varnika Butz, Milagros Reap on: 03/23/2023 01:07 PM   Modules accepted: Orders

## 2023-04-04 ENCOUNTER — Ambulatory Visit (INDEPENDENT_AMBULATORY_CARE_PROVIDER_SITE_OTHER): Payer: 59

## 2023-04-04 DIAGNOSIS — I428 Other cardiomyopathies: Secondary | ICD-10-CM

## 2023-04-04 LAB — CUP PACEART REMOTE DEVICE CHECK
Battery Remaining Longevity: 33 mo
Battery Voltage: 2.95 V
Brady Statistic RV Percent Paced: 0.1 %
Date Time Interrogation Session: 20250115002204
HighPow Impedance: 62 Ohm
Implantable Lead Connection Status: 753985
Implantable Lead Implant Date: 20160829
Implantable Lead Location: 753860
Implantable Pulse Generator Implant Date: 20160829
Lead Channel Impedance Value: 380 Ohm
Lead Channel Impedance Value: 456 Ohm
Lead Channel Pacing Threshold Amplitude: 0.75 V
Lead Channel Pacing Threshold Pulse Width: 0.4 ms
Lead Channel Sensing Intrinsic Amplitude: 27.875 mV
Lead Channel Sensing Intrinsic Amplitude: 27.875 mV
Lead Channel Setting Pacing Amplitude: 2 V
Lead Channel Setting Pacing Pulse Width: 0.4 ms
Lead Channel Setting Sensing Sensitivity: 0.3 mV
Zone Setting Status: 755011

## 2023-04-12 ENCOUNTER — Other Ambulatory Visit: Payer: Self-pay

## 2023-04-12 ENCOUNTER — Encounter (HOSPITAL_COMMUNITY): Payer: Self-pay | Admitting: Cardiology

## 2023-04-13 MED ORDER — MAGNESIUM OXIDE 400 MG PO TABS: 400.0000 mg | ORAL_TABLET | Freq: Every day | ORAL | 0 refills | Status: DC

## 2023-04-16 NOTE — Progress Notes (Incomplete)
Patient ID: Brian Noble, male   DOB: 12/31/1960, 63 y.o.   MRN: [id number]   PCP: Windell Moment, MD Cardiology: Dr Shirlee Latch EP. Dr Graciela Husbands   CC: HF follow up  63 y.o. with a prior history of cardiomyopathy who developed a cardiac arrest and was found to have low EF and normal coronaries.  Per patient, he was found to have low EF around 20% about 8 years ago.  He was seen by a cardiologist in [place] (he thinks), and EF went back to normal range.  He had no problems up until 8/16.  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  He was noted to pass out by family.  EMS was called and arrived quickly.  He was in either ventricular fibrillation or VT and was defibrillated.  He was cooled and sent for coronary angiography, which showed no significant coronary disease.  Echo showed EF 10% with severe RV dysfunction.  After recovery, he got a Medtronic ICD.  Repeat echo in 11/16 showed EF 20% with septal-lateral dyssynchrony, moderately decreased RV systolic function.  Unable to take Bidil due to severe headaches.    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 Appropriate shock due to VT. ECHO completed and showed EF 20-25% and normal RV. Carvedilol increased to 18.75 mg twice a day. He was discharged on 12/20/17. He had follow up with [name] EP NP and carvedilol was increased to 25 mg twice a day.   RHC/LHC was done in 11/19, showing no significant CAD and optimized filling pressures with preserved CI 2.31. CPX in 11/19 showed deconditioning but no significant cardiac limitation.    Echo in 7/21 showed EF 25-30%, mildly decreased RV systolic function. He had VT with ATP in 2/22, now on mexiletine.  He had VT again in 3/23 with ICD shock.  Zio monitor (4/23) showed rare PVCs, 40 short NSVT runs.    In 8/23, it looks like he had a VT run again terminated by ATP based on device interrogation.   Echo in 9/23 showed EF 20%, global hypokinesis, mildly decreased RV systolic function.    Follow up 10/23, had an episode where he passed out transiently (in the setting of poor po intake and being on a treadmill). No events on device interrogation. Referred to EP to discuss CRT-D.  EP held off again with minimal symptoms.   CPX 7/24 showed moderate functional limitation due to deconditioning and HF. There was significant chronotropic incompetence and frequent PVCs.  Seen in ED 04/16/23 with a/c HF. Given 40 mg IV lasix. He was discharged home with instructions to start Lasix 20 mg daily x 10 days.  Today he returns for post ER HF follow up with wife. Overall feeling fine. He just started Lasix 20 mg daily. No SOB walking up steps. Denies palpitations, CP, dizziness, edema, or PND/Orthopnea. Appetite ok. No fever or chills. Weight at home 190 pounds. Taking all medications. Does not wear CPAP.  ReDs reading: 47 %, abnormal  Medtronic device interrogation (personally reviewed): OptiVol up >1 month, thoracic impedence down, 3.5 hr/day activity, 99.9% V sensed, 73 runs of NSVT  ECG (personally reviewed): SR, 1AVB PR 230 msec, IVCD 160 msec  Labs (1/24): LDL 95 Labs (1/25): reviewed from ED 04/16/23 K 3.7, SCr 1   PMH: 1. LBBB/IVCD 2. HTN: x years 3. Hyperlipidemia 4. Chronic systolic CHF: Patient was told around 2008 that his EF was about 20%.  He says that it recovered back to normal.  He thinks he may have seen a cardiologist in [place] at that time. On 11/10/14, he was admitted after having ventricular fibrillation versus VT arrest and being shocked by AED.   - LHC (8/16): Normal coronaries, EF < 25%. - Echo (8/16): EF 10%, diffuse hypokinesis, mild MR, severely decreased RV systolic function.  - Medtronic ICD (8/16).   - Echo (11/16): EF 20%, septal-lateral dyssynchrony, mildly dilated LV with mild LVH, moderately decreased RV systolic function.  - CPX (3/17): Peak VO2 23.1 (69% predicted peak VO2) VE/VCO2 slope:  32 OUES: 2.00 Peak RER: 1.11, Ventilatory Threshold: 17.7 (53%  predicted or measured peak VO2) => Mild functional impairment. - Echo (12/17): EF 30%, mild LV dilation, septal-lateral dyssynchrony, mild to moderately decreased RV systolic function.  - ECHO (10/19): EF 20-25%.  - LHC/RHC (11/19): No significant coronary disease; RA mean 3, PA 24/10, PCWP mean 7, Cardiac Index (Fick) 2.31.  - CPX (11/19): Submaximal with RER 0.8, VE/VCO2 slope 29, peak VO2 15.1 => no significant cardiac limitation, appears deconditioned.  - Echo (7/21): EF 25-30%, mildly decreased RV systolic function - Echo (9/23): EF 20%, global hypokinesis, mildly decreased RV systolic function.  - [employer] gene testing for cardiomyopathy was negative.  - CPX (7/24): RER 1.07, VE/VCO2 slope 30, peak VO2 15.6 =>moderate function limitation from deconditioning and HF 5. Event monitor (4/17): No atrial fibrillation.  Runs NSVT noted.  6. VT 7. OSA: Severe, does not use CPAP.  8. Syncope 10/23: No VT recorded by device.    FH: Sister with heart transplant, father with cardiac arrest when about 39, 2 other sisters with "heart problems."  He has 10 siblings in all.   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  ROS: All systems reviewed and negative except as per HPI.   Current Outpatient Medications  Medication Sig Dispense Refill   aspirin EC 81 MG tablet Take 81 mg by mouth daily.     carvedilol (COREG) 12.5 MG tablet TAKE 1.5 TABLETS (18.75 MG TOTAL) BY MOUTH 2 (TWO) TIMES DAILY. 270 tablet 3   eplerenone (INSPRA) 50 MG tablet TAKE 1 TABLET BY MOUTH EVERY DAY 90 tablet 3   furosemide (LASIX) 20 MG tablet Take 2 tablets (40 mg total) by mouth as needed for fluid or edema. (Patient taking differently: Take 20 mg by mouth daily.) 60 tablet 11   JARDIANCE 10 MG TABS tablet TAKE 1 TABLET BY MOUTH EVERY DAY 30 tablet 11   lovastatin (MEVACOR) 20 MG tablet TAKE 1 TABLET BY MOUTH EVERY DAY 90 tablet 1   magnesium oxide (MAG-OX) 400 MG tablet Take 1 tablet  (400 mg total) by mouth daily. 30 tablet 0   mexiletine (MEXITIL) 200 MG capsule TAKE 1 CAPSULE BY MOUTH TWICE A DAY 180 capsule 2   sacubitril-valsartan (ENTRESTO) 97-103 MG TAKE 1 TABLET BY MOUTH TWICE A DAY 60 tablet 4   sildenafil (REVATIO) 20 MG tablet TAKE 1/2 TABLET BY MOUTH DAILY AS NEEDED FOR ERECTILE DYSFUNCTION 10 tablet 6   sodium zirconium cyclosilicate (LOKELMA) 10 g PACK packet Take 10 g by mouth daily. 30 packet 11   No current facility-administered medications for this encounter.   BP (!) 120/90   Pulse 76   Wt 88.8 kg (195 lb 12.8 oz)   SpO2 97%   BMI 29.77 kg/m   Wt Readings from Last 3 Encounters:  04/17/23 88.8 kg (195 lb 12.8 oz)  11/24/22 87.5 kg (193 lb)  07/21/22 86.7 kg (191 lb 3.2 oz)   Physical Exam General:  NAD. No resp difficulty, walked into clinic HEENT: Normal Neck: Supple. JVP 10-12 Carotids 2+ bilat; no bruits. No lymphadenopathy or thryomegaly appreciated. Cor: PMI nondisplaced. Regular rate & rhythm. No rubs, gallops or murmurs. Lungs: Clear Abdomen: Soft, nontender, nondistended. No hepatosplenomegaly. No bruits or masses. Good bowel sounds. Extremities: No cyanosis, clubbing, rash, edema Neuro: Alert & oriented x 3, cranial nerves grossly intact. Moves all 4 extremities w/o difficulty. Affect pleasant.  Assessment/Plan: 1. Chronic systolic CHF: Nonischemic cardiomyopathy.  Medtronic ICD.  Possibly familial CMP given father with SCD at 76, sister with heart transplant, and 2 other sisters with "heart problems."  However, [employer] gene testing was negative for common familial cardiomyopathies.  Cannot rule out prior viral myocarditis or role for HTN.  Zio monitor in 4/23 showed only rare PVCs.  Echo 12/17 with EF 30%, septal-lateral dyssynchrony.  CPX in 3/17 with mild functional impairment. Echo in 10/19 with EF 20-25%.  CPX repeat 01/2018 with submaximal effort but minimal cardiac impairment, suggestive of deconditioning.  RHC in 11/19 showed  optimized filling pressures with preserved cardiac index.  Echo in 7/21 with stable EF 25-30%.  Echo in 9/23 showed EF 20%. CPX (7/24) showed moderate functional limitation due to deconditioning and HF limitation. NYHA class I by his report, however he is volume overloaded on exam, ReDs clip abnormal at 49% - Increase Lasix to 40 mg bid x 3 days, then 40 mg daily. BMET and BNP today. - Hold Lokelma until labs result today. Likely will need lower dose going forward. Will need BMET on Friday. - Continue Coreg 18.75 mg bid. - Continue Entresto 97/103 bid.  BMET/BNP today.  - Continue Jardiance 10 mg daily.  - Continue eplerenone 50 mg daily.  - Unable to tolerate Bidil due to headaches.  - Patient has LBBB-like IVCD 160 msec.  He saw EP again and they decided to hold off device upgrade for BiV or LB pacing given minimal symptoms. Most recent CPX showed moderate HF limitation as well as limitation due to deconditioning. Consider device upgrade. - Will repeat echo when he is better diuresed. 2. VT: Has Medtronic ICD.  VT with syncope in 10/19.  VT with ATP in 2/22, started on mexiletine. VT with ICD shock in 3/23.  VT with ATP in 8/23.  Unable to interrogate device today. - Continue mexiletine.  3. OSA: Has refused to use CPAP.      Follow up in 2 weeks with APP for fluid check. Will arrange echo when he is diuresed.  Anderson Malta [facility] FNP-BC 04/17/2023

## 2023-04-17 ENCOUNTER — Encounter (HOSPITAL_COMMUNITY): Payer: Self-pay

## 2023-04-17 ENCOUNTER — Ambulatory Visit (HOSPITAL_COMMUNITY)
Admission: RE | Admit: 2023-04-17 | Discharge: 2023-04-17 | Disposition: A | Discharge status: Home / Self Care | Payer: 59 | Source: Ambulatory Visit | Attending: Family Medicine | Admitting: Family Medicine

## 2023-04-17 VITALS — BP 120/90 | HR 76 | Wt 195.8 lb

## 2023-04-17 DIAGNOSIS — I447 Left bundle-branch block, unspecified: Secondary | ICD-10-CM | POA: Diagnosis not present

## 2023-04-17 DIAGNOSIS — Z79899 Other long term (current) drug therapy: Secondary | ICD-10-CM | POA: Diagnosis not present

## 2023-04-17 DIAGNOSIS — G4733 Obstructive sleep apnea (adult) (pediatric): Secondary | ICD-10-CM | POA: Insufficient documentation

## 2023-04-17 DIAGNOSIS — Z7984 Long term (current) use of oral hypoglycemic drugs: Secondary | ICD-10-CM | POA: Insufficient documentation

## 2023-04-17 DIAGNOSIS — I472 Ventricular tachycardia, unspecified: Secondary | ICD-10-CM | POA: Diagnosis not present

## 2023-04-17 DIAGNOSIS — I428 Other cardiomyopathies: Secondary | ICD-10-CM | POA: Insufficient documentation

## 2023-04-17 DIAGNOSIS — I110 Hypertensive heart disease with heart failure: Secondary | ICD-10-CM | POA: Insufficient documentation

## 2023-04-17 DIAGNOSIS — Z8674 Personal history of sudden cardiac arrest: Secondary | ICD-10-CM | POA: Diagnosis not present

## 2023-04-17 DIAGNOSIS — I5022 Chronic systolic (congestive) heart failure: Secondary | ICD-10-CM | POA: Insufficient documentation

## 2023-04-17 LAB — BASIC METABOLIC PANEL
Anion gap: 11 (ref 5–15)
BUN: 10 mg/dL (ref 8–23)
CO2: 29 mmol/L (ref 22–32)
Calcium: 8.6 mg/dL — ABNORMAL LOW (ref 8.9–10.3)
Chloride: 102 mmol/L (ref 98–111)
Creatinine, Ser: 1.25 mg/dL — ABNORMAL HIGH (ref 0.61–1.24)
GFR, Estimated: >60 mL/min (ref >60)
Glucose, Bld: 91 mg/dL (ref 70–99)
Potassium: 3.6 mmol/L (ref 3.5–5.1)
Sodium: 142 mmol/L (ref 135–145)

## 2023-04-17 LAB — BRAIN NATRIURETIC PEPTIDE: B Natriuretic Peptide: 1940.8 pg/mL — ABNORMAL HIGH (ref 0.0–100.0)

## 2023-04-17 MED ORDER — FUROSEMIDE 20 MG PO TABS: 40.0000 mg | ORAL_TABLET | Freq: Every day | ORAL | 11 refills | Status: DC

## 2023-04-17 NOTE — Patient Instructions (Signed)
Medication Changes:  TAKE LASIX (FUROSEMIDE) TO 40MG  TWICE DAILY FOR 3 DAYS THEN RETURN TO 40MG  DAILY THEREAFTER  Lab Work:  Labs done today, your results will be available in MyChart, we will contact you for abnormal readings.  THEN AGAIN ON FRIDAY AS SCHEDULED   Follow-Up in: 2 weeks as scheduled   At the [facility], you and your health needs are our priority. We have a designated team specialized in the treatment of Heart Failure. This Care Team includes your primary Heart Failure Specialized Cardiologist (physician), Advanced Practice Providers (APPs- Physician Assistants and Nurse Practitioners), and Pharmacist who all work together to provide you with the care you need, when you need it.   You may see any of the following providers on your designated Care Team at your next follow up:  Dr. Arvilla Meres [name] Dr. Dorthula Nettles Dr. Theresia Bough Tonye Becket, NP Robbie Lis, [place] [facility], [place] Brynda Peon, NP [place] Lee, NP Karle Plumber, PharmD   Please be sure to bring in all your medications bottles to every appointment.   Need to Contact us:  If you have any questions or concerns before your next appointment please send [place] a message through [place] or call our office at [phone].    TO LEAVE A MESSAGE FOR THE NURSE SELECT OPTION 2, PLEASE LEAVE A MESSAGE INCLUDING: YOUR NAME DATE OF BIRTH CALL BACK NUMBER REASON FOR CALL**this is important as we prioritize the call backs  YOU WILL RECEIVE A CALL BACK THE SAME DAY AS LONG AS YOU CALL BEFORE 4:00 PM

## 2023-04-17 NOTE — Progress Notes (Signed)
ReDS Vest / Clip - 04/17/23 1000       ReDS Vest / Clip   Station Marker C    Ruler Value 30.5    ReDS Value Range High volume overload    ReDS Actual Value 47

## 2023-04-18 ENCOUNTER — Encounter (HOSPITAL_COMMUNITY): Payer: Self-pay | Admitting: Cardiology

## 2023-04-18 ENCOUNTER — Telehealth: Payer: Self-pay

## 2023-04-18 NOTE — Telephone Encounter (Signed)
Patient had follow up with heart doctor yesterday.

## 2023-04-19 NOTE — Telephone Encounter (Signed)
Just a [place]

## 2023-04-20 ENCOUNTER — Other Ambulatory Visit: Payer: Self-pay

## 2023-04-20 ENCOUNTER — Ambulatory Visit (HOSPITAL_COMMUNITY)
Admission: RE | Admit: 2023-04-20 | Discharge: 2023-04-20 | Disposition: A | Discharge status: Home / Self Care | Payer: 59 | Source: Ambulatory Visit | Attending: Cardiology | Admitting: Cardiology

## 2023-04-20 ENCOUNTER — Encounter (HOSPITAL_COMMUNITY): Payer: Self-pay

## 2023-04-20 ENCOUNTER — Telehealth (HOSPITAL_COMMUNITY): Payer: Self-pay | Admitting: Cardiology

## 2023-04-20 DIAGNOSIS — I5022 Chronic systolic (congestive) heart failure: Secondary | ICD-10-CM | POA: Insufficient documentation

## 2023-04-20 LAB — BASIC METABOLIC PANEL
Anion gap: 10 (ref 5–15)
BUN: 15 mg/dL (ref 8–23)
CO2: 29 mmol/L (ref 22–32)
Calcium: 8.7 mg/dL — ABNORMAL LOW (ref 8.9–10.3)
Chloride: 100 mmol/L (ref 98–111)
Creatinine, Ser: 1.13 mg/dL (ref 0.61–1.24)
GFR, Estimated: >60 mL/min (ref >60)
Glucose, Bld: 78 mg/dL (ref 70–99)
Potassium: 3.8 mmol/L (ref 3.5–5.1)
Sodium: 139 mmol/L (ref 135–145)

## 2023-04-20 NOTE — Telephone Encounter (Signed)
Pt has concerns about his potassium, please advise

## 2023-04-20 NOTE — Telephone Encounter (Signed)
Pt aware labs 1/31 WNL Continue current medications as prescribed by provider  If refills are needed contact pharmacy-current lasix rx has plenty

## 2023-04-28 ENCOUNTER — Other Ambulatory Visit: Payer: Self-pay | Admitting: Internal Medicine

## 2023-04-28 DIAGNOSIS — I472 Ventricular tachycardia, unspecified: Secondary | ICD-10-CM

## 2023-04-30 NOTE — Telephone Encounter (Signed)
 This is a CHF pt

## 2023-05-02 NOTE — Progress Notes (Signed)
Patient ID: Harmening QUINCY, male   DOB: [birth year], 63 y.o.   MRN: [id number]   PCP: Windell Moment, MD Cardiology: Dr Shirlee Latch EP. Dr Graciela Husbands   CC: HF follow up  63 y.o. with a prior history of cardiomyopathy who developed a cardiac arrest and was found to have low EF and normal coronaries.  Per patient, he was found to have low EF around 20% about 8 years ago.  He was seen by a cardiologist in [place] (he thinks), and EF went back to normal range.  He had no problems up until 8/16.  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  He was noted to pass out by family.  EMS was called and arrived quickly.  He was in either ventricular fibrillation or VT and was defibrillated.  He was cooled and sent for coronary angiography, which showed no significant coronary disease.  Echo showed EF 10% with severe RV dysfunction.  After recovery, he got a Medtronic ICD.  Repeat echo in 11/16 showed EF 20% with septal-lateral dyssynchrony, moderately decreased RV systolic function.  Unable to take Bidil due to severe headaches.    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 Appropriate shock due to VT. ECHO completed and showed EF 20-25% and normal RV. Carvedilol increased to 18.75 mg twice a day. He was discharged on 12/20/17. He had follow up with [name] EP NP and carvedilol was increased to 25 mg twice a day.   RHC/LHC was done in 11/19, showing no significant CAD and optimized filling pressures with preserved CI 2.31. CPX in 11/19 showed deconditioning but no significant cardiac limitation.    Echo in 7/21 showed EF 25-30%, mildly decreased RV systolic function. He had VT with ATP in 2/22, now on mexiletine.  He had VT again in 3/23 with ICD shock.  Zio monitor (4/23) showed rare PVCs, 40 short NSVT runs.    In 8/23, it looks like he had a VT run again terminated by ATP based on device interrogation.   Echo in 9/23 showed EF 20%, global hypokinesis, mildly decreased RV systolic function.    Follow up 10/23, had an episode where he passed out transiently (in the setting of poor po intake and being on a treadmill). No events on device interrogation. Referred to EP to discuss CRT-D.  EP held off again with minimal symptoms.   CPX 7/24 showed moderate functional limitation due to deconditioning and HF. There was significant chronotropic incompetence and frequent PVCs.  Seen in ED 04/16/23 with a/c HF. Given 40 mg IV lasix. He was discharged home with instructions to start Lasix 20 mg daily x 10 days.  Today she returns for HF follow up with his wife. Last visit he was volume overloaded and Lasix increased. Overall feeling fine. No SOB with activity. Exercises by walking up steps and riding his bike. Denies palpitations, abnormal bleeding, CP, dizziness, edema, or PND/Orthopnea. Appetite ok. No fever or chills. Not weighing at home. Taking all medications. Does not wear CPAP  ReDs reading: 40%, abnormal  Medtronic device interrogation (personally reviewed): unable to interrogate device in clinic today.  ECG (personally reviewed): none ordered today.  Labs (1/24): LDL 95 Labs (1/25): reviewed from ED 04/16/23 K 3.7, SCr 1 Labs (1/25): K 3.8, creatinine 1.13   PMH: 1. LBBB/IVCD 2. HTN: x years 3. Hyperlipidemia 4. Chronic systolic CHF: Patient was told around 2008 that his EF was about 20%.  He says that it recovered back to normal.  He thinks he may have seen a cardiologist in [place] at that time. On 11/10/14, he was admitted after having ventricular fibrillation versus VT arrest and being shocked by AED.   - LHC (8/16): Normal coronaries, EF < 25%. - Echo (8/16): EF 10%, diffuse hypokinesis, mild MR, severely decreased RV systolic function.  - Medtronic ICD (8/16).   - Echo (11/16): EF 20%, septal-lateral dyssynchrony, mildly dilated LV with mild LVH, moderately decreased RV systolic function.  - CPX (3/17): Peak VO2 23.1 (69% predicted peak VO2) VE/VCO2 slope:  32 OUES: 2.00  Peak RER: 1.11, Ventilatory Threshold: 17.7 (53% predicted or measured peak VO2) => Mild functional impairment. - Echo (12/17): EF 30%, mild LV dilation, septal-lateral dyssynchrony, mild to moderately decreased RV systolic function.  - ECHO (10/19): EF 20-25%.  - LHC/RHC (11/19): No significant coronary disease; RA mean 3, PA 24/10, PCWP mean 7, Cardiac Index (Fick) 2.31.  - CPX (11/19): Submaximal with RER 0.8, VE/VCO2 slope 29, peak VO2 15.1 => no significant cardiac limitation, appears deconditioned.  - Echo (7/21): EF 25-30%, mildly decreased RV systolic function - Echo (9/23): EF 20%, global hypokinesis, mildly decreased RV systolic function.  - [employer] gene testing for cardiomyopathy was negative.  - CPX (7/24): RER 1.07, VE/VCO2 slope 30, peak VO2 15.6 =>moderate function limitation from deconditioning and HF 5. Event monitor (4/17): No atrial fibrillation.  Runs NSVT noted.  6. VT 7. OSA: Severe, does not use CPAP.  8. Syncope 10/23: No VT recorded by device.    FH: Sister with heart transplant, father with cardiac arrest when about 18, 2 other sisters with "heart problems."  He has 10 siblings in all.   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  ROS: All systems reviewed and negative except as per HPI.   Current Outpatient Medications  Medication Sig Dispense Refill   albuterol (VENTOLIN HFA) 108 (90 Base) MCG/ACT inhaler Inhale 1 puff into the lungs every 4 (four) hours as needed for wheezing.     aspirin EC 81 MG tablet Take 81 mg by mouth daily.     budesonide (PULMICORT) 0.5 MG/2ML nebulizer solution USE ONE NEBULIZER TREATMENT THREE TIMES A DAY AS NEEDED FOR SHORTNESS OF BREATH     carvedilol (COREG) 12.5 MG tablet TAKE 1.5 TABLETS (18.75 MG TOTAL) BY MOUTH 2 (TWO) TIMES DAILY. 270 tablet 3   eplerenone (INSPRA) 50 MG tablet TAKE 1 TABLET BY MOUTH EVERY DAY 90 tablet 3   furosemide (LASIX) 20 MG tablet Take 2 tablets (40 mg total) by  mouth daily. 60 tablet 11   JARDIANCE 10 MG TABS tablet TAKE 1 TABLET BY MOUTH EVERY DAY 30 tablet 11   lovastatin (MEVACOR) 20 MG tablet TAKE 1 TABLET BY MOUTH EVERY DAY 90 tablet 1   magnesium oxide (MAG-OX) 400 MG tablet TAKE 1 TABLET BY MOUTH EVERY DAY 30 tablet 0   mexiletine (MEXITIL) 200 MG capsule TAKE 1 CAPSULE BY MOUTH TWICE A DAY 180 capsule 2   sacubitril-valsartan (ENTRESTO) 97-103 MG TAKE 1 TABLET BY MOUTH TWICE A DAY 60 tablet 4   sildenafil (REVATIO) 20 MG tablet TAKE 1/2 TABLET BY MOUTH DAILY AS NEEDED FOR ERECTILE DYSFUNCTION 10 tablet 6   No current facility-administered medications for this encounter.   BP 96/64   Pulse 67   Wt 83.8 kg (184 lb 12.8 oz)   SpO2 95%   BMI 28.10 kg/m   Wt Readings from Last 3 Encounters:  05/04/23 83.8 kg (184 lb 12.8 oz)  04/17/23 88.8 kg (195 lb 12.8 oz)  11/24/22 87.5 kg (193 lb)   Physical Exam General:  NAD. No resp difficulty, walked into clinic HEENT: Normal Neck: Supple. No JVD. Cor: Regular rate & rhythm. No rubs, gallops or murmurs. Lungs: Clear Abdomen: Soft, nontender, nondistended.  Extremities: No cyanosis, clubbing, rash, edema Neuro: Alert & oriented x 3, moves all 4 extremities w/o difficulty. Affect pleasant.  Assessment/Plan: 1. Chronic systolic CHF: Nonischemic cardiomyopathy.  Medtronic ICD.  Possibly familial CMP given father with SCD at 43, sister with heart transplant, and 2 other sisters with "heart problems."  However, [employer] gene testing was negative for common familial cardiomyopathies.  Cannot rule out prior viral myocarditis or role for HTN.  Zio monitor in 4/23 showed only rare PVCs.  Echo 12/17 with EF 30%, septal-lateral dyssynchrony.  CPX in 3/17 with mild functional impairment. Echo in 10/19 with EF 20-25%.  CPX repeat 01/2018 with submaximal effort but minimal cardiac impairment, suggestive of deconditioning.  RHC in 11/19 showed optimized filling pressures with preserved cardiac index.  Echo in  7/21 with stable EF 25-30%.  Echo in 9/23 showed EF 20%. CPX (7/24) showed moderate functional limitation due to deconditioning and HF limitation. NYHA class I, ReDs elevated at 40% (improved from 49% at last visit), weight is down 11 lbs and he is not volume overloaded by exam. - Continue Lasix 40 mg daily. BMET and BNP today. - Previously required [facility] for continue GDMT, recent labs show stable K at 3.8. - Continue Coreg 18.75 mg bid. - Continue Entresto 97/103 bid.  BMET/BNP today.  - Continue Jardiance 10 mg daily. No GU symptoms. - Continue eplerenone 50 mg daily.  - Unable to tolerate Bidil due to headaches.  - Patient has LBBB-like IVCD 160 msec (on ECG from 04/17/23).  He saw EP again and they decided to hold off device upgrade for BiV or LB pacing given minimal symptoms. Most recent CPX showed moderate HF limitation as well as limitation due to deconditioning. Consider device upgrade. - Repeat echo. 2. VT: Has Medtronic ICD.  VT with syncope in 10/19.  VT with ATP in 2/22, started on mexiletine. VT with ICD shock in 3/23.  VT with ATP in 8/23. As above, unable to interrogate device in clinic today. - Continue mexiletine.  3. OSA: Has refused to use CPAP.      Follow up in 2 months with Dr. Shirlee Latch + echo  [place] [facility] FNP-BC 05/04/2023

## 2023-05-03 ENCOUNTER — Telehealth (HOSPITAL_COMMUNITY): Payer: Self-pay

## 2023-05-03 NOTE — Telephone Encounter (Signed)
Called and left patient a voice message to confirm/remind patient of their appointment at the [facility] on 05/04/23.   And to bring in all medications and/or complete list.

## 2023-05-04 ENCOUNTER — Ambulatory Visit (HOSPITAL_COMMUNITY)
Admission: RE | Admit: 2023-05-04 | Discharge: 2023-05-04 | Disposition: A | Discharge status: Home / Self Care | Payer: 59 | Source: Ambulatory Visit | Attending: Family Medicine | Admitting: Family Medicine

## 2023-05-04 ENCOUNTER — Encounter (HOSPITAL_COMMUNITY): Payer: Self-pay

## 2023-05-04 VITALS — BP 96/64 | HR 67 | Wt 184.8 lb

## 2023-05-04 DIAGNOSIS — I5022 Chronic systolic (congestive) heart failure: Secondary | ICD-10-CM

## 2023-05-04 DIAGNOSIS — I110 Hypertensive heart disease with heart failure: Secondary | ICD-10-CM | POA: Insufficient documentation

## 2023-05-04 DIAGNOSIS — Z79899 Other long term (current) drug therapy: Secondary | ICD-10-CM | POA: Diagnosis not present

## 2023-05-04 DIAGNOSIS — Z95810 Presence of automatic (implantable) cardiac defibrillator: Secondary | ICD-10-CM | POA: Diagnosis not present

## 2023-05-04 DIAGNOSIS — I447 Left bundle-branch block, unspecified: Secondary | ICD-10-CM | POA: Insufficient documentation

## 2023-05-04 DIAGNOSIS — G4733 Obstructive sleep apnea (adult) (pediatric): Secondary | ICD-10-CM | POA: Diagnosis not present

## 2023-05-04 DIAGNOSIS — I472 Ventricular tachycardia, unspecified: Secondary | ICD-10-CM | POA: Diagnosis not present

## 2023-05-04 DIAGNOSIS — I428 Other cardiomyopathies: Secondary | ICD-10-CM | POA: Insufficient documentation

## 2023-05-04 LAB — BASIC METABOLIC PANEL
Anion gap: 10 (ref 5–15)
BUN: 23 mg/dL (ref 8–23)
CO2: 26 mmol/L (ref 22–32)
Calcium: 9 mg/dL (ref 8.9–10.3)
Chloride: 103 mmol/L (ref 98–111)
Creatinine, Ser: 1.48 mg/dL — ABNORMAL HIGH (ref 0.61–1.24)
GFR, Estimated: 53 mL/min — ABNORMAL LOW (ref >60)
Glucose, Bld: 83 mg/dL (ref 70–99)
Potassium: 4.7 mmol/L (ref 3.5–5.1)
Sodium: 139 mmol/L (ref 135–145)

## 2023-05-04 LAB — BRAIN NATRIURETIC PEPTIDE: B Natriuretic Peptide: 260.1 pg/mL — ABNORMAL HIGH (ref 0.0–100.0)

## 2023-05-04 NOTE — Patient Instructions (Addendum)
Thank you for coming in today  If you had labs drawn today, any labs that are abnormal the clinic will call you No news is good news  Medications: No changes  Follow up appointments:  Your physician recommends that you schedule a follow-up appointment in:  2 months With Dr. Shirlee Latch with an echo  You will receive a reminder letter in the mail a few months in advance. If you don't receive a letter, please call our office to schedule the follow-up appointment.    Do the following things EVERYDAY: Weigh yourself in the morning before breakfast. Write it down and keep it in a log. Take your medicines as prescribed Eat low salt foods--Limit salt (sodium) to 2000 mg per day.  Stay as active as you can everyday Limit all fluids for the day to less than 2 liters   At the [facility], you and your health needs are our priority. As part of our continuing mission to provide you with exceptional heart care, we have created designated Provider Care Teams. These Care Teams include your primary Cardiologist (physician) and Advanced Practice Providers (APPs- Physician Assistants and Nurse Practitioners) who all work together to provide you with the care you need, when you need it.   You may see any of the following providers on your designated Care Team at your next follow up: Dr Arvilla Meres Dr Marca Ancona Dr. Marcos Eke, NP Robbie Lis, [place] [facility] [place], [place] Brynda Peon, NP Karle Plumber, PharmD   Please be sure to bring in all your medications bottles to every appointment.    Thank you for choosing [facility]  If you have any questions or concerns before your next appointment please send [place] a message through [place] or call our office at [phone].    TO LEAVE A MESSAGE FOR THE NURSE SELECT OPTION 2, PLEASE LEAVE A MESSAGE INCLUDING: YOUR NAME DATE OF BIRTH CALL BACK NUMBER REASON  FOR CALL**this is important as we prioritize the call backs  YOU WILL RECEIVE A CALL BACK THE SAME DAY AS LONG AS YOU CALL BEFORE 4:00 PM

## 2023-05-04 NOTE — Addendum Note (Signed)
Encounter addended by: Faythe Casa, CMA on: 05/04/2023 10:24 AM  Actions taken: Order list changed, Diagnosis association updated, Flowsheet accepted, Clinical Note Signed, Charge Capture section accepted

## 2023-05-04 NOTE — Progress Notes (Signed)
ReDS Vest / Clip - 05/04/23 1000       ReDS Vest / Clip   Station Marker C    Ruler Value 30    ReDS Value Range Moderate volume overload    ReDS Actual Value 40

## 2023-05-15 NOTE — Progress Notes (Signed)
 Remote ICD transmission.

## 2023-05-16 ENCOUNTER — Encounter: Payer: Self-pay | Admitting: Internal Medicine

## 2023-05-18 ENCOUNTER — Encounter: Payer: Self-pay | Admitting: Internal Medicine

## 2023-05-18 ENCOUNTER — Ambulatory Visit: Payer: 59 | Attending: Internal Medicine | Admitting: Internal Medicine

## 2023-05-18 VITALS — BP 110/74 | HR 60 | Ht 68.0 in | Wt 190.0 lb

## 2023-05-18 DIAGNOSIS — Z95810 Presence of automatic (implantable) cardiac defibrillator: Secondary | ICD-10-CM | POA: Diagnosis not present

## 2023-05-18 DIAGNOSIS — I472 Ventricular tachycardia, unspecified: Secondary | ICD-10-CM | POA: Diagnosis not present

## 2023-05-18 DIAGNOSIS — I428 Other cardiomyopathies: Secondary | ICD-10-CM

## 2023-05-18 LAB — CUP PACEART INCLINIC DEVICE CHECK
Date Time Interrogation Session: 20250228171739
Implantable Lead Connection Status: 753985
Implantable Lead Implant Date: 20160829
Implantable Lead Location: 753860
Implantable Pulse Generator Implant Date: 20160829

## 2023-05-18 NOTE — Patient Instructions (Signed)
 Medication Instructions:  None ordered.  *If you need a refill on your cardiac medications before your next appointment, please call your pharmacy*   Lab Work: BMET,Mg and BNP today If you have labs (blood work) drawn today and your tests are completely normal, you will receive your results only by: MyChart Message (if you have MyChart) OR A paper copy in the mail If you have any lab test that is abnormal or we need to change your treatment, we will call you to review the results.   Testing/Procedures: None ordered.    Follow-Up: At [facility], you and your health needs are our priority.  As part of our continuing mission to provide you with exceptional heart care, we have created designated Provider Care Teams.  These Care Teams include your primary Cardiologist (physician) and Advanced Practice Providers (APPs -  Physician Assistants and Nurse Practitioners) who all work together to provide you with the care you need, when you need it.  We recommend signing up for the patient portal called "MyChart".  Sign up information is provided on this After Visit Summary.  MyChart is used to connect with patients for Virtual Visits (Telemedicine).  Patients are able to view lab/test results, encounter notes, upcoming appointments, etc.  Non-urgent messages can be sent to your provider as well.   To learn more about what you can do with MyChart, go to [email or web address].    Your next appointment:   6 months with Dr Graciela Husbands

## 2023-05-18 NOTE — Progress Notes (Signed)
 Patient Care Team: Windell Moment, MD as PCP - General (Family Medicine) Laurey Morale, MD as PCP - Advanced Heart Failure (Cardiology) [name], MD as PCP - Electrophysiology (Cardiology)   HPI  Brian Noble is a 63 y.o. male Seen in follow-up for aborted cardiac arrest in the context of nonischemic cardiomyopathy and hypokalemia.  8/16>> Medtronic ICD.  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  Egrams reviewed by Dr. Leonia Reeves who started him on mexiletine.  tolerating     Interval ventricular tachycardia 2/22 cycle length approximately 300 ms terminated with ATP; occurred while preaching.  A recurring theme   Appropriate Therapy yes  8/20 VT/VFl (CL 220 msec>> failed ATP + shock---- dirty break with VT NS )   Yes 8/21 ; VT ATP 2/22; ATP failed with acceleration   Recurrent therapy 3/23.  Monomorphic ventricular tachycardia cycle length of 230 ms.  ATP successfully terminated with a dirty break with resumption of tachycardia spontaneous termination repeat tachycardia and then acceleration with repeat ATP into ventricular fibrillation and shock.  Guideline directed therapy has not allowed for the continuation of eplerenone secondary to hypokalemia.   Interval ER 2/2 SOB and edema  Rs with IV lasix,  Since then, denies worsening problems with shortness of breath peripheral edema or abdominal fullness.  Inappropriate Therapy  none Antiarrhythmics Date   mexiletine  8/20            DATE TEST EF   11/16    Echo 20 %   12/17    Echo 30 %   10/19 Echo  20-25%   11/19 LHC/RHC  No Obs CAD Normal filling press  7/21 Echo  25-30%   9/23 Echo  20%      Date Cr K Mg BNP  12/17  1.17 5.0 2.1   8/20 1.27 4.0 2.0(5/20)   3/23 1.23 4.8    2/25 1.48 4.7  260<< 1940   OSA-severe-not treated   Records and Results Reviewed hosp records  Past Medical History:  Diagnosis Date   Arthritis    Chronic kidney  disease    Chronic systolic CHF (congestive heart failure) (HCC)    a. Echo 8/16:  EF 10%, diff HK, Gr 1 DD, mild dilated aortic root, mild MR, mild LAE, severely reduced RVSF   Hypertension    ICD (implantable cardioverter-defibrillator) in place    NICM (nonischemic cardiomyopathy) (HCC)    a. LHC 8/16:  normal coronary arteries.    VF (ventricular fibrillation) (HCC)    s/p OOH cardiac arrest 8/16 >> s/p ICD    Past Surgical History:  Procedure Laterality Date   APPENDECTOMY     CARDIAC CATHETERIZATION N/A 11/10/2014   Procedure: Left Heart Cath and Coronary Angiography;  Surgeon: [name], MD;  Location[facility] INVASIVE CV LAB;  Service: Cardiovascular;  Laterality: N/A;   EP IMPLANTABLE DEVICE N/A 11/16/2014   Procedure: ICD Implant;  Surgeon: [name], MD;  Location: [facility] INVASIVE CV LAB;  Service: Cardiovascular;  Laterality: N/A;   RIGHT HEART CATH AND CORONARY ANGIOGRAPHY N/A 01/30/2018   Procedure: RIGHT HEART CATH AND CORONARY ANGIOGRAPHY;  Surgeon: Laurey Morale, MD;  Location: [facility] INVASIVE CV LAB;  Service: Cardiovascular;  Laterality: N/A;   ULTRASOUND GUIDANCE FOR VASCULAR ACCESS  01/30/2018   Procedure: Ultrasound Guidance For Vascular Access;  Surgeon: Laurey Morale, MD;  Location: [facility] INVASIVE  CV LAB;  Service: Cardiovascular;;    Current Outpatient Medications  Medication Sig Dispense Refill   albuterol (VENTOLIN HFA) 108 (90 Base) MCG/ACT inhaler Inhale 1 puff into the lungs every 4 (four) hours as needed for wheezing.     aspirin EC 81 MG tablet Take 81 mg by mouth daily.     budesonide (PULMICORT) 0.5 MG/2ML nebulizer solution USE ONE NEBULIZER TREATMENT THREE TIMES A DAY AS NEEDED FOR SHORTNESS OF BREATH     carvedilol (COREG) 12.5 MG tablet TAKE 1.5 TABLETS (18.75 MG TOTAL) BY MOUTH 2 (TWO) TIMES DAILY. 270 tablet 3   eplerenone (INSPRA) 50 MG tablet TAKE 1 TABLET BY MOUTH EVERY DAY 90 tablet 3   furosemide (LASIX) 20 MG tablet Take 2 tablets (40 mg  total) by mouth daily. 60 tablet 11   JARDIANCE 10 MG TABS tablet TAKE 1 TABLET BY MOUTH EVERY DAY 30 tablet 11   lovastatin (MEVACOR) 20 MG tablet TAKE 1 TABLET BY MOUTH EVERY DAY 90 tablet 1   magnesium oxide (MAG-OX) 400 MG tablet TAKE 1 TABLET BY MOUTH EVERY DAY 30 tablet 0   mexiletine (MEXITIL) 200 MG capsule TAKE 1 CAPSULE BY MOUTH TWICE A DAY 180 capsule 2   sacubitril-valsartan (ENTRESTO) 97-103 MG TAKE 1 TABLET BY MOUTH TWICE A DAY 60 tablet 4   sildenafil (REVATIO) 20 MG tablet TAKE 1/2 TABLET BY MOUTH DAILY AS NEEDED FOR ERECTILE DYSFUNCTION 10 tablet 6   No current facility-administered medications for this visit.    No Known Allergies    Review of Systems negative except from HPI and PMH  Physical Exam BP 110/74   Pulse 60   Ht 5\' 8"  (1.727 m)   Wt 190 lb (86.2 kg)   SpO2 96%   BMI 28.89 kg/m  Well developed and well nourished in no acute distress HENT normal Neck supple with JVP-flat Clear Device pocket well healed; without hematoma or erythema.  There is no tethering  Regular rate and rhythm, no  gallop No  murmur Abd-soft with active BS No Clubbing cyanosis  edema Skin-warm and dry A & Oriented  Grossly normal sensory and motor function  ECG sinus at 60 Intervals 23/16/43 Left bundle branch block ECG 1/25 probably is lead placement error based on a negative QRS in lead I and upright QRS in lead aVR ECG 5/24 shows left bundle branch block  Device function is normal. Programming changes none   See Paceart for details    ECG 05/27/2021 reviewed QRSd 142 ms with a qR in lead I monophasic R wave in lead aVL and a Q wave in lead V6 ECG 11/21 QRSd 144 with a rS in lead 1L and V6 Assessment and  Plan  Aborted cardiac arrest  Ventricular tachycardia/flutter with appropriate therapy 8/20 8/21, 2/22, 3/23  Ventricular tachycardia-nonsustained  Nonischemic cardiomyopathy  IVCD>>LBBB   Eplerenone intolerance secondary to hyperkalemia  Implantable  defibrillator-Medtronic  The patient's device was interrogated.        No intercurrent sustained ventricular tachycardia, but there is a flurry of nonsustained VT dating back following his hospitalization regarding been seen in heart failure clinic at which time his diuretics were further uptitrated.  I wonder about electrolytes.  We will repeat them today as well as check a BNP.  While his OptiVol has returned to normal, there is a downward trend in the thoracic impedance again.  He asked about preaching, I suggest that he take his evening medications including his carvedilol prior to preaching  in the evening  The ECG looks more like left bundle branch block.  The one from 1/25 I think is a lead placement error.  I have reached out to Dr. Shirlee Latch as I think it would be appropriate to consider CRT upgrade given his left ventricular dysfunction and ongoing symptoms.

## 2023-05-19 LAB — BASIC METABOLIC PANEL
BUN/Creatinine Ratio: 17 (ref 10–24)
BUN: 22 mg/dL (ref 8–27)
CO2: 22 mmol/L (ref 20–29)
Calcium: 9.5 mg/dL (ref 8.6–10.2)
Chloride: 103 mmol/L (ref 96–106)
Creatinine, Ser: 1.33 mg/dL — ABNORMAL HIGH (ref 0.76–1.27)
Glucose: 88 mg/dL (ref 70–99)
Potassium: 5.2 mmol/L (ref 3.5–5.2)
Sodium: 141 mmol/L (ref 134–144)
eGFR: 60 mL/min/{1.73_m2} (ref >59)

## 2023-05-19 LAB — PRO B NATRIURETIC PEPTIDE: NT-Pro BNP: 874 pg/mL — ABNORMAL HIGH (ref 0–210)

## 2023-05-19 LAB — MAGNESIUM: Magnesium: 2.5 mg/dL — ABNORMAL HIGH (ref 1.6–2.3)

## 2023-05-21 LAB — KAPPA/LAMBDA LIGHT CHAINS
Ig Kappa Free Light Chain: 20.7 mg/L — ABNORMAL HIGH (ref 3.3–19.4)
Ig Lambda Free Light Chain: 18.4 mg/L (ref 5.7–26.3)
KAPPA/LAMBDA RATIO: 1.13 (ref 0.26–1.65)

## 2023-05-24 LAB — IMMUNOFIXATION, URINE

## 2023-05-25 ENCOUNTER — Ambulatory Visit (INDEPENDENT_AMBULATORY_CARE_PROVIDER_SITE_OTHER): Payer: 59

## 2023-05-25 VITALS — BP 100/60 | HR 56 | Temp 97.4°F | Resp 14 | Ht 68.0 in | Wt 191.0 lb

## 2023-05-25 DIAGNOSIS — I472 Ventricular tachycardia, unspecified: Secondary | ICD-10-CM

## 2023-05-25 DIAGNOSIS — I110 Hypertensive heart disease with heart failure: Secondary | ICD-10-CM | POA: Diagnosis not present

## 2023-05-25 DIAGNOSIS — E782 Mixed hyperlipidemia: Secondary | ICD-10-CM

## 2023-05-25 DIAGNOSIS — I5022 Chronic systolic (congestive) heart failure: Secondary | ICD-10-CM

## 2023-05-25 NOTE — Patient Instructions (Signed)
 VISIT SUMMARY:  You came in today for a follow-up regarding your heart condition and medication management. You have a history of ventricular tachycardia and are monitored by a device. You have not experienced any symptoms or episodes recently. Your kidney function is stable, and you are generally feeling well with enough energy for daily activities. You are compliant with your heart medications but do not regularly use your inhalers. Your recent blood work showed stable kidney function and a slightly elevated magnesium level. You have gained approximately seven pounds since your last cholesterol panel in September 2024. You are up to date with your eye and dental exams and declined the pneumonia vaccine.  YOUR PLAN:  -VENTRICULAR TACHYCARDIA: Ventricular tachycardia is a condition where the heart beats very fast. You have not had any recent episodes, and your device has not detected any issues. Continue taking your heart medications as prescribed and follow up with your cardiologist as needed.  -CHRONIC KIDNEY DISEASE: Chronic kidney disease means your kidneys are not working as well as they should. Your recent blood work shows stable kidney function with a creatinine level of 1.33 and a filtration rate of 60. Your magnesium level is slightly elevated, but there are no specific concerns at this time.  -HYPERLIPIDEMIA: Hyperlipidemia means you have high levels of fats in your blood, which can affect your heart health. Your last cholesterol panel was in September 2024, and you have gained seven pounds since then. We will order a new cholesterol panel to monitor your levels.  -HYPERURICEMIA: Hyperuricemia means you have high levels of uric acid in your blood, which can lead to gout. You have not had any recent gout symptoms and are not currently on medication for it.  -GENERAL HEALTH MAINTENANCE: You are up to date with your eye and dental exams. You declined the pneumonia vaccine, which is a one-time  shot for lifetime protection.  INSTRUCTIONS:  Please schedule a follow-up appointment in 3-4 months. We will also order a cholesterol panel to monitor your levels.

## 2023-05-25 NOTE — Assessment & Plan Note (Addendum)
 Ventricular tachycardia managed with cardiology follow-up. No recent symptomatic episodes; any occurrences detected by device AICD. Compliant with cardiac medications. No chest pain during physical activities.  Recent follow up with cardiology noted. Continue current meds including Mexiletine 200 mg TWICE DAILY,        General Health Maintenance Declined pneumonia vaccine. Recent eye and dental exams completed.  Follow-up Generally feeling well. No immediate follow-up blood work needed beyond cholesterol panel. - Schedule follow-up appointment in 3-4 months.

## 2023-05-25 NOTE — Progress Notes (Signed)
 Subjective:  Patient ID: Brian Noble, male    DOB: December 12, 1960  Age: 63 y.o. MRN: [id number]  Chief Complaint  Patient presents with   Medical Management of Chronic Issues    Discussed the use of AI scribe software for clinical note transcription with the patient, who gave verbal consent to proceed.    The patient, with ventricular tachycardia, presents for a follow-up regarding his heart condition and medication management.  He has a history of ventricular tachycardia and is monitored by a device. He has not experienced any symptoms or episodes of ventricular tachycardia recently, with no episodes felt in January or February. Any occurrences would have been detected by his device, as he did not feel anything himself.  He generally feels well and has enough energy to perform daily activities, including his work in Comptroller and receiving, which involves lifting rolls of fabric. No chest pain during these activities. No current pain, swelling in his legs, or bowel issues.  He confirms adherence to his heart medications and water pills but does not regularly use his inhalers, including albuterol and budesonide nebulizer, which were prescribed for fluid around his lungs. He keeps them on hand just in case.  Recent blood work showed a creatinine level of 1.33 and a filtration rate of 60, indicating stable kidney function. His magnesium level was slightly elevated at 2.5. He recalls a history of high uric acid but has not experienced gout recently and no longer takes medication for it.  He had his last cholesterol panel in September 2024 and has gained approximately seven pounds since then. He is up to date with eye and dental exams. He declined the pneumonia vaccine, which is a one-time shot for lifetime protection.     05/25/2023    9:05 AM 11/24/2022    8:58 AM 07/15/2020    9:12 AM 08/04/2019    8:03 AM  Depression screen PHQ 2/9  Decreased Interest 0 0 0 0  Down, Depressed,  Hopeless 0 0 0 0  PHQ - 2 Score 0 0 0 0  Altered sleeping 0 0    Tired, decreased energy 0 0    Change in appetite 0 0    Feeling bad or failure about yourself  0 0    Trouble concentrating 0 0    Moving slowly or fidgety/restless 0 0    Suicidal thoughts 0 0    PHQ-9 Score 0 0    Difficult doing work/chores Not difficult at all Not difficult at all          05/25/2023    9:05 AM  Fall Risk   Falls in the past year? 0  Number falls in past yr: 0  Injury with Fall? 0  Risk for fall due to : No Fall Risks  Follow up Falls evaluation completed    Patient Care Team: Windell Moment, MD as PCP - General (Family Medicine) Laurey Morale, MD as PCP - Advanced Heart Failure (Cardiology) [name], MD as PCP - Electrophysiology (Cardiology)   Review of Systems  Constitutional:  Negative for chills, fatigue, fever and unexpected weight change.  HENT:  Negative for congestion, ear pain, sinus pain and sore throat.   Respiratory:  Negative for cough and shortness of breath.   Cardiovascular:  Negative for chest pain and palpitations.  Gastrointestinal:  Negative for abdominal pain, blood in stool, constipation, diarrhea, nausea and vomiting.  Endocrine: Negative for polydipsia.  Genitourinary:  Negative for dysuria.  Musculoskeletal:  Negative for back pain.  Skin:  Negative for rash.  Neurological:  Negative for headaches.    Current Outpatient Medications on File Prior to Visit  Medication Sig Dispense Refill   albuterol (VENTOLIN HFA) 108 (90 Base) MCG/ACT inhaler Inhale 1 puff into the lungs every 4 (four) hours as needed for wheezing.     aspirin EC 81 MG tablet Take 81 mg by mouth daily.     carvedilol (COREG) 12.5 MG tablet TAKE 1.5 TABLETS (18.75 MG TOTAL) BY MOUTH 2 (TWO) TIMES DAILY. 270 tablet 3   eplerenone (INSPRA) 50 MG tablet TAKE 1 TABLET BY MOUTH EVERY DAY 90 tablet 3   furosemide (LASIX) 20 MG tablet Take 2 tablets (40 mg total) by mouth daily. 60 tablet 11    JARDIANCE 10 MG TABS tablet TAKE 1 TABLET BY MOUTH EVERY DAY 30 tablet 11   lovastatin (MEVACOR) 20 MG tablet TAKE 1 TABLET BY MOUTH EVERY DAY 90 tablet 1   magnesium oxide (MAG-OX) 400 MG tablet TAKE 1 TABLET BY MOUTH EVERY DAY 30 tablet 0   mexiletine (MEXITIL) 200 MG capsule TAKE 1 CAPSULE BY MOUTH TWICE A DAY 180 capsule 2   sacubitril-valsartan (ENTRESTO) 97-103 MG TAKE 1 TABLET BY MOUTH TWICE A DAY 60 tablet 4   sildenafil (REVATIO) 20 MG tablet TAKE 1/2 TABLET BY MOUTH DAILY AS NEEDED FOR ERECTILE DYSFUNCTION 10 tablet 6   budesonide (PULMICORT) 0.5 MG/2ML nebulizer solution USE ONE NEBULIZER TREATMENT THREE TIMES A DAY AS NEEDED FOR SHORTNESS OF BREATH (Patient not taking: Reported on 05/25/2023)     No current facility-administered medications on file prior to visit.   Past Medical History:  Diagnosis Date   Arthritis    Chronic kidney disease    Chronic systolic CHF (congestive heart failure) (HCC)    a. Echo 8/16:  EF 10%, diff HK, Gr 1 DD, mild dilated aortic root, mild MR, mild LAE, severely reduced RVSF   Hypertension    ICD (implantable cardioverter-defibrillator) in place    NICM (nonischemic cardiomyopathy) (HCC)    a. LHC 8/16:  normal coronary arteries.    VF (ventricular fibrillation) (HCC)    s/p OOH cardiac arrest 8/16 >> s/p ICD   Past Surgical History:  Procedure Laterality Date   APPENDECTOMY     CARDIAC CATHETERIZATION N/A 11/10/2014   Procedure: Left Heart Cath and Coronary Angiography;  Surgeon: [name], MD;  Location: [facility] INVASIVE CV LAB;  Service: Cardiovascular;  Laterality: N/A;   EP IMPLANTABLE DEVICE N/A 11/16/2014   Procedure: ICD Implant;  Surgeon: [name], MD;  Location: [facility] INVASIVE CV LAB;  Service: Cardiovascular;  Laterality: N/A;   RIGHT HEART CATH AND CORONARY ANGIOGRAPHY N/A 01/30/2018   Procedure: RIGHT HEART CATH AND CORONARY ANGIOGRAPHY;  Surgeon: Laurey Morale, MD;  Location: [facility] INVASIVE CV LAB;  Service: Cardiovascular;   Laterality: N/A;   ULTRASOUND GUIDANCE FOR VASCULAR ACCESS  01/30/2018   Procedure: Ultrasound Guidance For Vascular Access;  Surgeon: Laurey Morale, MD;  Location: [facility] INVASIVE CV LAB;  Service: Cardiovascular;;    Family History  Problem Relation Age of Onset   Hypertension Mother    Hypertension Father    Sudden death Father    Diabetes Sister    Heart disease Sister    Heart disease Sister    Hypertension Sister    Hypertension Sister    Hypertension Sister    Hypertension Brother    Hypertension Brother    Hypertension  Brother    Hypertension Brother    Social History   Socioeconomic History   Marital status: Married    Spouse name: Not on file   Number of children: 2   Years of education: 12   Highest education level: 12th grade  Occupational History   Not on file  Tobacco Use   Smoking status: Never   Smokeless tobacco: Former    Types: Chew    Quit date: 2000  Vaping Use   Vaping status: Never Used  Substance and Sexual Activity   Alcohol use: No   Drug use: No   Sexual activity: Yes    Partners: Female  Other Topics Concern   Not on file  Social History Narrative   Not on file   Social Drivers of Health   Financial Resource Strain: Low Risk  (02/19/2018)   Overall Financial Resource Strain (CARDIA)    Difficulty of Paying Living Expenses: Not hard at all  Food Insecurity: No Food Insecurity (02/19/2018)   Hunger Vital Sign    Worried About Running Out of Food in the Last Year: Never true    Ran Out of Food in the Last Year: Never true  Transportation Needs: No Transportation Needs (02/19/2018)   PRAPARE - Administrator, Civil Service (Medical): No    Lack of Transportation (Non-Medical): No  Physical Activity: Not on file  Stress: Not on file  Social Connections: Unknown (08/02/2021)   Received from [facility]   Social Network    Social Network: Not on file    Objective:  BP 100/60   Pulse (!) 56   Temp (!)  97.4 F (36.3 C)   Resp 14   Ht 5\' 8"  (1.727 m)   Wt 191 lb (86.6 kg)   SpO2 98%   BMI 29.04 kg/m      05/25/2023    8:57 AM 05/18/2023    8:44 AM 05/04/2023    9:28 AM  BP/Weight  Systolic BP 100 110 96  Diastolic BP 60 74 64  Wt. (Lbs) 191 190 184.8  BMI 29.04 kg/m2 28.89 kg/m2 28.1 kg/m2    Physical Exam Vitals and nursing note reviewed.  Constitutional:      Appearance: Normal appearance.  HENT:     Head: Normocephalic and atraumatic.  Cardiovascular:     Rate and Rhythm: Normal rate and regular rhythm.  Pulmonary:     Effort: Pulmonary effort is normal.     Breath sounds: Normal breath sounds.  Musculoskeletal:        General: No swelling. Normal range of motion.  Neurological:     General: No focal deficit present.     Mental Status: He is alert.  Psychiatric:        Mood and Affect: Mood normal.     Diabetic Foot Exam - Simple   No data filed      Lab Results  Component Value Date   WBC 6.3 11/24/2022   HGB 15.9 11/24/2022   HCT 48.6 11/24/2022   PLT 221 11/24/2022   GLUCOSE 88 05/18/2023   CHOL 162 11/24/2022   TRIG 163 (H) 11/24/2022   HDL 43 11/24/2022   LDLCALC 91 11/24/2022   ALT 39 07/30/2020   AST 32 07/30/2020   NA 141 05/18/2023   K 5.2 05/18/2023   CL 103 05/18/2023   CREATININE 1.33 (H) 05/18/2023   BUN 22 05/18/2023   CO2 22 05/18/2023   TSH 1.020 03/24/2022  INR 0.97 01/29/2018   HGBA1C 4.9 03/24/2022      Assessment & Plan:  Assessment and Plan       Mixed hyperlipidemia Assessment & Plan: Last cholesterol panel in September 2024. Monitoring cholesterol levels is important due to cardiac health and medication. Weight gain of approximately seven pounds since last visit may impact cholesterol levels. - Order cholesterol panel. - continue MEVACOR 20 MG daily at this time. Increase dose if needed after reviewing test results  Orders: -     Lipid panel -     Magnesium -     Basic metabolic panel  Hypertensive  heart disease with chronic systolic congestive heart failure (HCC) Assessment & Plan: Continue guidelines directed medical therapy with Aspirin 81 mg daily, Coreg 18.75 mg TWICE DAILY., Lasix 40 mg daily, Jardiance 10 mg daily, mevacor 20 mg daily, Entresto 97-103 mg TWICE DAILY.  I did not find a latest echo after 11/2021 which showed EF of 20%. Unsure if it was done at his cardiologist's office, But appears compensated at this time.  Orders: -     Magnesium -     Basic metabolic panel  Ventricular tachycardia [facility]) Assessment & Plan: Ventricular tachycardia managed with cardiology follow-up. No recent symptomatic episodes; any occurrences detected by device AICD. Compliant with cardiac medications. No chest pain during physical activities.  Recent follow up with cardiology noted. Continue current meds including Mexiletine 200 mg TWICE DAILY,        General Health Maintenance Declined pneumonia vaccine. Recent eye and dental exams completed.  Follow-up Generally feeling well. No immediate follow-up blood work needed beyond cholesterol panel. - Schedule follow-up appointment in 3-4 months.  Orders: -     Magnesium -     Basic metabolic panel     No orders of the defined types were placed in this encounter.   Orders Placed This Encounter  Procedures   Lipid panel   Magnesium   Basic Metabolic Panel     Follow-up: No follow-ups on file. An After Visit Summary was printed and given to the patient.  Windell Moment, MD [facility] [phone]

## 2023-05-25 NOTE — Assessment & Plan Note (Addendum)
 Continue guidelines directed medical therapy with Aspirin 81 mg daily, Coreg 18.75 mg TWICE DAILY., Lasix 40 mg daily, Jardiance 10 mg daily, mevacor 20 mg daily, Entresto 97-103 mg TWICE DAILY.  I did not find a latest echo after 11/2021 which showed EF of 20%. Unsure if it was done at his cardiologist's office, But appears compensated at this time.

## 2023-05-25 NOTE — Assessment & Plan Note (Signed)
 Last cholesterol panel in September 2024. Monitoring cholesterol levels is important due to cardiac health and medication. Weight gain of approximately seven pounds since last visit may impact cholesterol levels. - Order cholesterol panel. - continue MEVACOR 20 MG daily at this time. Increase dose if needed after reviewing test results

## 2023-05-26 LAB — BASIC METABOLIC PANEL
BUN/Creatinine Ratio: 22 (ref 10–24)
BUN: 32 mg/dL — ABNORMAL HIGH (ref 8–27)
CO2: 22 mmol/L (ref 20–29)
Calcium: 9.6 mg/dL (ref 8.6–10.2)
Chloride: 103 mmol/L (ref 96–106)
Creatinine, Ser: 1.45 mg/dL — ABNORMAL HIGH (ref 0.76–1.27)
Glucose: 90 mg/dL (ref 70–99)
Potassium: 5.5 mmol/L — ABNORMAL HIGH (ref 3.5–5.2)
Sodium: 140 mmol/L (ref 134–144)
eGFR: 54 mL/min/{1.73_m2} — ABNORMAL LOW (ref >59)

## 2023-05-26 LAB — LIPID PANEL
Chol/HDL Ratio: 3.9 ratio (ref 0.0–5.0)
Cholesterol, Total: 161 mg/dL (ref 100–199)
HDL: 41 mg/dL (ref >39)
LDL Chol Calc (NIH): 90 mg/dL (ref 0–99)
Triglycerides: 173 mg/dL — ABNORMAL HIGH (ref 0–149)
VLDL Cholesterol Cal: 30 mg/dL (ref 5–40)

## 2023-05-26 LAB — MAGNESIUM: Magnesium: 2.4 mg/dL — ABNORMAL HIGH (ref 1.6–2.3)

## 2023-06-12 ENCOUNTER — Other Ambulatory Visit (HOSPITAL_COMMUNITY): Payer: Self-pay | Admitting: Internal Medicine

## 2023-06-12 ENCOUNTER — Encounter (HOSPITAL_COMMUNITY): Payer: Self-pay | Admitting: Cardiology

## 2023-06-12 ENCOUNTER — Other Ambulatory Visit: Payer: Self-pay | Admitting: Family Medicine

## 2023-06-12 DIAGNOSIS — F5221 Male erectile disorder: Secondary | ICD-10-CM

## 2023-06-15 MED ORDER — EMPAGLIFLOZIN 10 MG PO TABS: 10.0000 mg | ORAL_TABLET | Freq: Every day | ORAL | 11 refills | Status: AC

## 2023-06-18 ENCOUNTER — Telehealth: Payer: Self-pay

## 2023-06-18 NOTE — Telephone Encounter (Signed)
-----   Message from Sherryl Manges sent at 06/13/2023 12:07 PM EDT ----- Please Inform Patient that study failed to show evidence of myeloma --stable labs Also Dr DM would like to proceed with CRT upgrade so can we put him on the list for this  Thanks

## 2023-06-18 NOTE — Telephone Encounter (Signed)
 Spoke with pt and advised of lab results and recommendation per Dr Graciela Husbands and Dr Shirlee Latch.  Pt states he is doing fine and would prefer to schedule an appointment to discuss need for upgrade rather than just schedule procedure.  Pt advised will have Dr Odessa Fleming scheduler call pt to set up appointment.  Pt verbalizes understanding and agrees with current plan.

## 2023-06-18 NOTE — Telephone Encounter (Addendum)
 PA approved via covermymeds for Jardiance.

## 2023-06-18 NOTE — Telephone Encounter (Signed)
 PA submitted via covrmymeds.  Copied from CRM [id number]. Topic: Clinical - Prescription Issue >> Jun 18, 2023  7:50 AM Everette C wrote: Reason for CRM: The patient has been contacted by their pharmacy and directed to request that their PCP complete prior authorization for their prescription of empagliflozin (JARDIANCE) 10 MG TABS tablet [[id number]]  Please contact the patient further if needed

## 2023-06-19 ENCOUNTER — Other Ambulatory Visit (HOSPITAL_COMMUNITY): Payer: Self-pay

## 2023-06-19 NOTE — Telephone Encounter (Signed)
 Review of patient chart shows that a prior authorization for Jardiance was approved on 06/18/23. Test billing returns refill too soon rejection, indicating pharmacy has already processed this medication. Nothing further needed at this time

## 2023-07-04 ENCOUNTER — Ambulatory Visit (INDEPENDENT_AMBULATORY_CARE_PROVIDER_SITE_OTHER): Payer: BLUE CROSS/BLUE SHIELD

## 2023-07-04 DIAGNOSIS — I428 Other cardiomyopathies: Secondary | ICD-10-CM

## 2023-07-05 LAB — CUP PACEART REMOTE DEVICE CHECK
Battery Remaining Longevity: 29 mo
Battery Voltage: 2.93 V
Brady Statistic RV Percent Paced: 0.07 %
Date Time Interrogation Session: 20250416232607
HighPow Impedance: 70 Ohm
Implantable Lead Connection Status: 753985
Implantable Lead Implant Date: 20160829
Implantable Lead Location: 753860
Implantable Pulse Generator Implant Date: 20160829
Lead Channel Impedance Value: 380 Ohm
Lead Channel Impedance Value: 456 Ohm
Lead Channel Pacing Threshold Amplitude: 0.625 V
Lead Channel Pacing Threshold Pulse Width: 0.4 ms
Lead Channel Sensing Intrinsic Amplitude: 27.25 mV
Lead Channel Sensing Intrinsic Amplitude: 27.25 mV
Lead Channel Setting Pacing Amplitude: 2 V
Lead Channel Setting Pacing Pulse Width: 0.4 ms
Lead Channel Setting Sensing Sensitivity: 0.3 mV
Zone Setting Status: 755011

## 2023-07-12 ENCOUNTER — Encounter: Payer: Self-pay | Admitting: Internal Medicine

## 2023-07-12 NOTE — Telephone Encounter (Signed)
 Pt is currently scheduled to see Dr Rodolfo Clan on 07/27/2023.

## 2023-07-16 ENCOUNTER — Telehealth: Payer: Self-pay

## 2023-07-16 DIAGNOSIS — Z95810 Presence of automatic (implantable) cardiac defibrillator: Secondary | ICD-10-CM

## 2023-07-16 DIAGNOSIS — I472 Ventricular tachycardia, unspecified: Secondary | ICD-10-CM

## 2023-07-16 NOTE — Telephone Encounter (Signed)
 ICD Unscheduled remote transmission:  alert for VT with therapy. Normal device function. 1 VT treated with ATP and 1 shock on 07/13/23, EGM shows WCT that deteriorates into VT/VF after ATP and results in 1 successful shock. 1 NSVT earlier that day lasting 13 beats.  7 other NSVT detections within the last week, longest 14 beats. Recent increase in nonsustained ventricular arrhythmias in the last month. HF diagnostics currently normal. Follow up as scheduled. Routing to triage for VT with therapy per protocol. - CS, CVRS  While reviewing transmission I noticed one shock pathway that needs to be reprogrammed. Additionally, Post shock pacing needs to be enabled.   Syncopal episode. Pt was trimming hedges at the time. Asymptomatic post shock. Not taking mag because ( per patient) PCP stopped it. Compliant with other meds. ED precautions given. Driving restrictions x 6 months advised. Has appointment with Rodolfo Clan on 07/27/23 that will have to be rescheduled. Sent to [facility] MD who will be assuming care.

## 2023-07-16 NOTE — Telephone Encounter (Signed)
 Spoke w/ MD Camnitz. BMP Mag ordered. Message sent to scheduling to get patient in with EP APP ASAP and cancel 5/9 appointment with Rodolfo Clan as he is out. Called and told patient about lab work ordered.

## 2023-07-19 ENCOUNTER — Telehealth: Payer: Self-pay | Admitting: Cardiology

## 2023-07-19 NOTE — Telephone Encounter (Signed)
 Brian Noble,  Could you call this patient to discuss a plan with? I am not sure what the plan will moving forward.

## 2023-07-19 NOTE — Telephone Encounter (Signed)
 Spoke with pt and discussed importance of seeing one of Dr Doyle Generous colleagues  while Dr Rodolfo Clan is out on medical leave.  Pt is agreeable to schedule with another provider after he sees Dr Mitzie Anda tomorrow.  Pt advised will have scheduling contact him the afternoon of 07/20/2023 to schedule appointment.  Pt verbalizes understanding and agrees with current plan.

## 2023-07-19 NOTE — Telephone Encounter (Signed)
 Called to confirm/remind patient of their appointment at the [facility] on 07/20/23.   Appointment:   [x] Confirmed  [] Left mess   [] No answer/No voice mail  [] VM Full/unable to leave message  [] Phone not in service  Patient reminded to bring all medications and/or complete list.  Confirmed patient has transportation. Gave directions, instructed to utilize valet parking.

## 2023-07-20 ENCOUNTER — Other Ambulatory Visit
Admission: RE | Admit: 2023-07-20 | Discharge: 2023-07-20 | Disposition: A | Discharge status: Home / Self Care | Source: Ambulatory Visit | Attending: Cardiology | Admitting: Cardiology

## 2023-07-20 ENCOUNTER — Ambulatory Visit (HOSPITAL_BASED_OUTPATIENT_CLINIC_OR_DEPARTMENT_OTHER): Admitting: Cardiology

## 2023-07-20 ENCOUNTER — Telehealth: Payer: Self-pay | Admitting: Cardiology

## 2023-07-20 VITALS — BP 123/71 | HR 56 | Wt 191.0 lb

## 2023-07-20 DIAGNOSIS — I5022 Chronic systolic (congestive) heart failure: Secondary | ICD-10-CM | POA: Diagnosis present

## 2023-07-20 LAB — BASIC METABOLIC PANEL WITH GFR
Anion gap: 9 (ref 5–15)
BUN: 21 mg/dL (ref 8–23)
CO2: 26 mmol/L (ref 22–32)
Calcium: 9.2 mg/dL (ref 8.9–10.3)
Chloride: 104 mmol/L (ref 98–111)
Creatinine, Ser: 1.14 mg/dL (ref 0.61–1.24)
GFR, Estimated: >60 mL/min (ref >60)
Glucose, Bld: 97 mg/dL (ref 70–99)
Potassium: 4.3 mmol/L (ref 3.5–5.1)
Sodium: 139 mmol/L (ref 135–145)

## 2023-07-20 LAB — BRAIN NATRIURETIC PEPTIDE: B Natriuretic Peptide: 426.9 pg/mL — ABNORMAL HIGH (ref 0.0–100.0)

## 2023-07-20 LAB — MAGNESIUM: Magnesium: 2.2 mg/dL (ref 1.7–2.4)

## 2023-07-20 NOTE — Telephone Encounter (Signed)
 Dr Mitzie Anda wants Dr Lawana Pray to see pt asap. Please advise

## 2023-07-20 NOTE — Telephone Encounter (Signed)
See 4/28 phone note

## 2023-07-20 NOTE — Patient Instructions (Addendum)
 Medication Changes:  No medication changes today!  Lab Work:  Go over to the MEDICAL MALL. Go pass the gift shop and have your blood work completed.  We will only call you if the results are abnormal or if the provider would like to make medication changes.   Testing/Procedures:  Your physician has requested that you have an echocardiogram. Echocardiography is a painless test that uses sound waves to create images of your heart. It provides your doctor with information about the size and shape of your heart and how well your heart's chambers and valves are working. This procedure takes approximately one hour. There are no restrictions for this procedure. Please do NOT wear cologne, perfume, aftershave, or lotions (deodorant is allowed). Please arrive 15 minutes prior to your appointment time.  Please note: We ask at that you not bring children with you during ultrasound (echo/ vascular) testing. Due to room size and safety concerns, children are not allowed in the ultrasound rooms during exams. Our front office staff cannot provide observation of children in our lobby area while testing is being conducted. An adult accompanying a patient to their appointment will only be allowed in the ultrasound room at the discretion of the ultrasound technician under special circumstances. We apologize for any inconvenience.  Please have your echo completed. You will check in for this at the [facility]ICAL [facility]. You have to arrive 15 MINS EARLY for preparation, otherwise you will have to reschedule.  Special Instructions // Education:  Please follow up with Dr. Lawana Pray office as soon as possible.  Follow-Up in: Please follow up with the [facility] in 3 months with Dr. Mitzie Anda. We currently do not have that schedule. Please give us  a call in July in order to schedule your appointment for August.  At the [facility], you and your health needs are our priority. We have a  designated team specialized in the treatment of Heart Failure. This Care Team includes your primary Heart Failure Specialized Cardiologist (physician), Advanced Practice Providers (APPs- Physician Assistants and Nurse Practitioners), and Pharmacist who all work together to provide you with the care you need, when you need it.   You may see any of the following providers on your designated Care Team at your next follow up:  Dr. Jules Oar Dr. Peder Bourdon Dr. Alwin Baars Dr. Judyth Nunnery Shawnee Dellen, FNP Bevely Brush, RPH-CPP  Please be sure to bring in all your medications bottles to every appointment.   Need to Contact Us :  If you have any questions or concerns before your next appointment please send us  a message through [place] or call our office at [phone].    TO LEAVE A MESSAGE FOR THE NURSE SELECT OPTION 2, PLEASE LEAVE A MESSAGE INCLUDING: YOUR NAME DATE OF BIRTH CALL BACK NUMBER REASON FOR CALL**this is important as we prioritize the call backs  YOU WILL RECEIVE A CALL BACK THE SAME DAY AS LONG AS YOU CALL BEFORE 4:00 PM

## 2023-07-22 NOTE — Progress Notes (Signed)
 Patient ID: Brian Noble, male   DOB: 16-Oct-1960, 63 y.o.   MRN: [id number]   PCP: Sirivol, Mamatha, MD Cardiology: Dr Mitzie Anda EP. Dr Rodolfo Clan   CC: HF follow up  63 y.o. with a prior history of cardiomyopathy who developed a cardiac arrest and was found to have low EF and normal coronaries.  Per patient, he was found to have low EF around 20% about 8 years ago.  He was seen by a cardiologist in [place] (he thinks), and EF went back to normal range.  He had no problems up until 8/16.  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  He was noted to pass out by family.  EMS was called and arrived quickly.  He was in either ventricular fibrillation or VT and was defibrillated.  He was cooled and sent for coronary angiography, which showed no significant coronary disease.  Echo showed EF 10% with severe RV dysfunction.  After recovery, he got a Medtronic ICD.  Repeat echo in 11/16 showed EF 20% with septal-lateral dyssynchrony, moderately decreased RV systolic function.  Unable to take Bidil due to severe headaches.    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 Appropriate shock due to VT. ECHO completed and showed EF 20-25% and normal RV. Carvedilol  increased to 18.75 mg twice a day. He was discharged on 12/20/17. He had follow up with Bertis Brochure EP NP and carvedilol  was increased to 25 mg twice a day.   RHC/LHC was done in 11/19, showing no significant CAD and optimized filling pressures with preserved CI 2.31. CPX in 11/19 showed deconditioning but no significant cardiac limitation.    Echo in 7/21 showed EF 25-30%, mildly decreased RV systolic function. He had VT with ATP in 2/22, now on mexiletine.  He had VT again in 3/23 with ICD shock.  Zio monitor (4/23) showed rare PVCs, 40 short NSVT runs.    In 8/23, it looks like he had a VT run again terminated by ATP based on device interrogation.   Echo in 9/23 showed EF 20%, global hypokinesis, mildly decreased RV systolic function.    Follow up 10/23, had an episode where he passed out transiently (in the setting of poor po intake and being on a treadmill). No events on device interrogation. Referred to EP to discuss CRT-D.  EP held off again with minimal symptoms.   CPX 7/24 showed moderate functional limitation due to deconditioning and HF. There was significant chronotropic incompetence and frequent PVCs.  Seen in ED 04/16/23 with a/c HF. Given 40 mg IV lasix . He was discharged home with instructions to start Lasix  20 mg daily x 10 days.  Patient had VT while trimming his hedge in 3/25, he had a syncopal episode and his ICD shocked him back to NSR.   Today she returns for HF follow up with his wife. He has had no further VT.  He has dyspnea only with heavy exertion, no problem with usual ADLs.  Continues to work full time.  No lightheadedness, no chest pain, no palpitations.  Weight has trended up.   ECG (personally reviewed): NSR, LBBB-like IVCD 160 msec  Labs (1/24): LDL 95 Labs (1/25): reviewed from ED 04/16/23 K 3.7, SCr 1 Labs (1/25): K 3.8, creatinine 1.13 Labs (3/25): K 5.5, creatinine 1.45, LDL 90   PMH: 1. LBBB/IVCD 2. HTN: x years 3. Hyperlipidemia 4. Chronic systolic CHF: Patient was told around 2008 that his EF was about 20%.  He says that it recovered  back to normal.  He thinks he may have seen a cardiologist in [place] at that time. On 11/10/14, he was admitted after having ventricular fibrillation versus VT arrest and being shocked by AED.   - LHC (8/16): Normal coronaries, EF < 25%. - Echo (8/16): EF 10%, diffuse hypokinesis, mild MR, severely decreased RV systolic function.  - Medtronic ICD (8/16).   - Echo (11/16): EF 20%, septal-lateral dyssynchrony, mildly dilated LV with mild LVH, moderately decreased RV systolic function.  - CPX (3/17): Peak VO2 23.1 (69% predicted peak VO2) VE/VCO2 slope:  32 OUES: 2.00 Peak RER: 1.11, Ventilatory Threshold: 17.7 (53% predicted or measured peak VO2) => Mild  functional impairment. - Echo (12/17): EF 30%, mild LV dilation, septal-lateral dyssynchrony, mild to moderately decreased RV systolic function.  - ECHO (10/19): EF 20-25%.  - LHC/RHC (11/19): No significant coronary disease; RA mean 3, PA 24/10, PCWP mean 7, Cardiac Index (Fick) 2.31.  - CPX (11/19): Submaximal with RER 0.8, VE/VCO2 slope 29, peak VO2 15.1 => no significant cardiac limitation, appears deconditioned.  - Echo (7/21): EF 25-30%, mildly decreased RV systolic function - Echo (9/23): EF 20%, global hypokinesis, mildly decreased RV systolic function.  - [employer] gene testing for cardiomyopathy was negative.  - CPX (7/24): RER 1.07, VE/VCO2 slope 30, peak VO2 15.6 =>moderate function limitation from deconditioning and HF 5. Event monitor (4/17): No atrial fibrillation.  Runs NSVT noted.  6. VT: With history of syncope.  7. OSA: Severe, does not use CPAP.    FH: Sister with heart transplant, father with cardiac arrest when about 67, 2 other sisters with "heart problems."  He has 10 siblings in all.   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  ROS: All systems reviewed and negative except as per HPI.   Current Outpatient Medications  Medication Sig Dispense Refill   albuterol (VENTOLIN HFA) 108 (90 Base) MCG/ACT inhaler Inhale 1 puff into the lungs every 4 (four) hours as needed for wheezing.     aspirin  EC 81 MG tablet Take 81 mg by mouth daily.     budesonide (PULMICORT) 0.5 MG/2ML nebulizer solution      carvedilol  (COREG ) 12.5 MG tablet TAKE 1.5 TABLETS (18.75 MG TOTAL) BY MOUTH 2 (TWO) TIMES DAILY. 270 tablet 3   empagliflozin  (JARDIANCE ) 10 MG TABS tablet Take 1 tablet (10 mg total) by mouth daily. 30 tablet 11   ENTRESTO  97-103 MG TAKE 1 TABLET BY MOUTH TWICE A DAY 60 tablet 4   eplerenone  (INSPRA ) 50 MG tablet TAKE 1 TABLET BY MOUTH EVERY DAY 90 tablet 3   furosemide  (LASIX ) 20 MG tablet Take 2 tablets (40 mg total) by mouth daily.  60 tablet 11   lovastatin  (MEVACOR ) 20 MG tablet TAKE 1 TABLET BY MOUTH EVERY DAY 90 tablet 1   mexiletine (MEXITIL) 200 MG capsule TAKE 1 CAPSULE BY MOUTH TWICE A DAY 180 capsule 2   sildenafil  (REVATIO ) 20 MG tablet TAKE 1/2 TABLET BY MOUTH DAILY AS NEEDED FOR ERECTILE DYSFUNCTION 10 tablet 6   magnesium  oxide (MAG-OX) 400 MG tablet TAKE 1 TABLET BY MOUTH EVERY DAY (Patient not taking: Reported on 07/20/2023) 30 tablet 0   No current facility-administered medications for this visit.   BP 123/71   Pulse (!) 56   Wt 191 lb (86.6 kg)   SpO2 99%   BMI 29.04 kg/m   Wt Readings from Last 3 Encounters:  07/20/23 191 lb (86.6 kg)  05/25/23 191 lb (86.6 kg)  05/18/23 190 lb (86.2 kg)  General: NAD Neck: No JVD, no thyromegaly or thyroid  nodule.  Lungs: Clear to auscultation bilaterally with normal respiratory effort. CV: Nondisplaced PMI.  Heart regular S1/S2, no S3/S4, no murmur.  No peripheral edema.  No carotid bruit.  Normal pedal pulses.  Abdomen: Soft, nontender, no hepatosplenomegaly, no distention.  Skin: Intact without lesions or rashes.  Neurologic: Alert and oriented x 3.  Psych: Normal affect. Extremities: No clubbing or cyanosis.  HEENT: Normal.   Assessment/Plan: 1. Chronic systolic CHF: Nonischemic cardiomyopathy.  Medtronic ICD.  Possibly familial CMP given father with SCD at 54, sister with heart transplant, and 2 other sisters with "heart problems."  However, [employer] gene testing was negative for common familial cardiomyopathies.  Cannot rule out prior viral myocarditis or role for HTN.  Zio monitor in 4/23 showed only rare PVCs.  Echo 12/17 with EF 30%, septal-lateral dyssynchrony.  CPX in 3/17 with mild functional impairment. Echo in 10/19 with EF 20-25%.  CPX repeat 01/2018 with submaximal effort but minimal cardiac impairment, suggestive of deconditioning.  RHC in 11/19 showed optimized filling pressures with preserved cardiac index.  Echo in 7/21 with stable EF 25-30%.   Echo in 9/23 showed EF 20%. CPX (7/24) showed moderate functional limitation due to deconditioning and HF limitation. NYHA class II with recent episode of VT/syncope.  He is not volume overloaded on exam though weight has trended up.  - Continue Lasix  40 mg daily. BMET and BNP today. - Previously required Lokelma  for hyperkalemia and last BMET with K 5.5.  Send BMET today.  - Continue Coreg  18.75 mg bid, will not increase with HR in 50s. . - Continue Entresto  97/103 bid.   - Continue Jardiance  10 mg daily.  - Continue eplerenone  50 mg daily.  - Unable to tolerate Bidil due to headaches.  - Patient has LBBB-like IVCD 160 msec. His device has not been upgraded to CRT-D in the past due to minimal symptoms.  However, now with NYHA class II symptoms, elevated BNP, abnormal CPX and recent VT episode.  He is agreeable to CRT-D upgrade. He needs to see EP, says he can only come to a Friday appointment.  Will try to schedule soon.  - Repeat echo. 2. VT: Has Medtronic ICD.  VT with syncope in 10/19.  VT with ATP in 2/22, started on mexiletine. VT with ICD shock in 3/23.  VT with ATP in 8/23. VT with shock and syncope in 3/25.  - Continue Coreg  - Continue mexiletine.  - Appointment with EP as above.  - Check BMET, Mg. - No driving x 6 months.  3. OSA: Has refused to use CPAP.      Followup in 3 months with me, needs appointment soon with EP to discuss CRT-D upgrade. .   I spent 31 minutes reviewing records, interviewing/examining patient, and managing orders.   Peder Bourdon 07/22/2023

## 2023-07-25 NOTE — Progress Notes (Unsigned)
  Electrophysiology Office Note:   Date:  07/25/2023  ID:  Brian Noble, DOB 15-Jun-1960, MRN [id number]  Primary Cardiologist: None Primary Heart Failure: Peder Bourdon, MD Electrophysiologist: Richardo Chandler, MD  {Click to update primary MD,subspecialty MD or APP then REFRESH:1}    History of Present Illness:   Brian Noble is a 63 y.o. male with h/o CKD, chronic systolic heart failure, hypertension, VF arrest seen today for routine electrophysiology followup.   Since last being seen in our clinic the patient reports doing ***.  he denies chest pain, palpitations, dyspnea, PND, orthopnea, nausea, vomiting, dizziness, syncope, edema, weight gain, or early satiety.   Review of systems complete and found to be negative unless listed in HPI.      EP Information / Studies Reviewed:    {EKGtoday:28818}      ICD Interrogation-  reviewed in detail today,  See PACEART report.  Device History: Medtronic Single Chamber ICD implanted 2016 for aborted cardiac arrest History of appropriate therapy: Yes History of AAD therapy: Yes; currently on mexiletine    Risk Assessment/Calculations:   {Does this patient have ATRIAL FIBRILLATION?:[phone]}         Physical Exam:   VS:  There were no vitals taken for this visit.   Wt Readings from Last 3 Encounters:  07/20/23 191 lb (86.6 kg)  05/25/23 191 lb (86.6 kg)  05/18/23 190 lb (86.2 kg)     GEN: Well nourished, well developed in no acute distress NECK: No JVD; No carotid bruits CARDIAC: {EPRHYTHM:28826}, no murmurs, rubs, gallops RESPIRATORY:  Clear to auscultation without rales, wheezing or rhonchi  ABDOMEN: Soft, non-tender, non-distended EXTREMITIES:  No edema; No deformity   ASSESSMENT AND PLAN:    Chronic systolic dysfunction s/p Medtronic single chamber ICD  euvolemic today Stable on an appropriate medical regimen Normal ICD function Sensing, threshold, impedance within normal limits Device parameter reviewed and stable  for patient See Valeta Gaudier Art report No changes today  2.  Ventricular tachycardia:***  3.  Obstructive sleep apnea: Patient refuses CPAP  Disposition:   Follow up with {EPPROVIDERS:28135} {EPFOLLOW WU:98119}   Signed, Zan Orlick Cortland Ding, MD

## 2023-07-26 ENCOUNTER — Other Ambulatory Visit: Payer: Self-pay

## 2023-07-26 ENCOUNTER — Encounter: Payer: Self-pay | Admitting: Cardiology

## 2023-07-26 ENCOUNTER — Ambulatory Visit: Attending: Cardiology | Admitting: Cardiology

## 2023-07-26 VITALS — BP 113/76 | HR 56 | Ht 68.0 in | Wt 199.0 lb

## 2023-07-26 DIAGNOSIS — G4733 Obstructive sleep apnea (adult) (pediatric): Secondary | ICD-10-CM

## 2023-07-26 DIAGNOSIS — I5022 Chronic systolic (congestive) heart failure: Secondary | ICD-10-CM

## 2023-07-26 DIAGNOSIS — I472 Ventricular tachycardia, unspecified: Secondary | ICD-10-CM

## 2023-07-26 LAB — CUP PACEART INCLINIC DEVICE CHECK
Date Time Interrogation Session: 20250508150221
Implantable Lead Connection Status: 753985
Implantable Lead Implant Date: 20160829
Implantable Lead Location: 753860
Implantable Pulse Generator Implant Date: 20160829

## 2023-07-26 NOTE — Patient Instructions (Signed)
 Medication Instructions:  Your physician recommends that you continue on your current medications as directed. Please refer to the Current Medication list given to you today.  *If you need a refill on your cardiac medications before your next appointment, please call your pharmacy*  Lab Work: None ordered  If you have any lab test that is abnormal or we need to change your treatment, we will call you to review the results.  Testing/Procedures: .instcr  Follow-Up: At [facility], you and your health needs are our priority.  As part of our continuing mission to provide you with exceptional heart care, our providers are all part of one team.  This team includes your primary Cardiologist (physician) and Advanced Practice Providers or APPs (Physician Assistants and Nurse Practitioners) who all work together to provide you with the care you need, when you need it.  Your next appointment:   2 week(s) after your procedure  Provider:   Device clinic for a wound check     Thank you for choosing [facility]!!   Reece Cane, RN [phone]   Other Instructions

## 2023-07-27 ENCOUNTER — Telehealth (HOSPITAL_COMMUNITY): Payer: Self-pay

## 2023-07-27 ENCOUNTER — Ambulatory Visit: Admitting: Internal Medicine

## 2023-07-27 LAB — CBC
Hematocrit: 50.8 % (ref 37.5–51.0)
Hemoglobin: 16.5 g/dL (ref 13.0–17.7)
MCH: 29.8 pg (ref 26.6–33.0)
MCHC: 32.5 g/dL (ref 31.5–35.7)
MCV: 92 fL (ref 79–97)
Platelets: 216 10*3/uL (ref 150–450)
RBC: 5.54 x10E6/uL (ref 4.14–5.80)
RDW: 12.7 % (ref 11.6–15.4)
WBC: 7.2 10*3/uL (ref 3.4–10.8)

## 2023-07-27 NOTE — Telephone Encounter (Signed)
 Attempted to reach patient to discuss upcoming procedure, no answer. Left VM for patient to return call.

## 2023-07-28 ENCOUNTER — Other Ambulatory Visit: Payer: Self-pay | Admitting: Cardiology

## 2023-07-28 DIAGNOSIS — I472 Ventricular tachycardia, unspecified: Secondary | ICD-10-CM

## 2023-07-31 ENCOUNTER — Encounter: Payer: Self-pay | Admitting: Internal Medicine

## 2023-07-31 NOTE — Pre-Procedure Instructions (Signed)
 Attempted to call patient regarding procedure instructions.  Left voicemail on the following items: Arrival time 0900 Nothing to eat or drink after midnight No meds AM of procedure Responsible person to drive you home and stay with you for 24 hrs Wash with special soap night before and morning of procedure If on anti-coagulant drug instructions Brian Noble- last dose 2 days ago.

## 2023-08-01 ENCOUNTER — Encounter (HOSPITAL_COMMUNITY)
Admission: RE | Disposition: A | Discharge status: Home / Self Care | Payer: Self-pay | Source: Home / Self Care | Attending: Internal Medicine

## 2023-08-01 ENCOUNTER — Ambulatory Visit (HOSPITAL_COMMUNITY)
Admission: RE | Admit: 2023-08-01 | Discharge: 2023-08-01 | Disposition: A | Discharge status: Home / Self Care | Attending: Internal Medicine | Admitting: Internal Medicine

## 2023-08-01 ENCOUNTER — Encounter: Payer: Self-pay | Admitting: Emergency Medicine

## 2023-08-01 ENCOUNTER — Ambulatory Visit (HOSPITAL_COMMUNITY)

## 2023-08-01 ENCOUNTER — Other Ambulatory Visit: Payer: Self-pay

## 2023-08-01 DIAGNOSIS — I428 Other cardiomyopathies: Secondary | ICD-10-CM | POA: Diagnosis not present

## 2023-08-01 DIAGNOSIS — Z79899 Other long term (current) drug therapy: Secondary | ICD-10-CM | POA: Diagnosis not present

## 2023-08-01 DIAGNOSIS — N189 Chronic kidney disease, unspecified: Secondary | ICD-10-CM | POA: Insufficient documentation

## 2023-08-01 DIAGNOSIS — G4733 Obstructive sleep apnea (adult) (pediatric): Secondary | ICD-10-CM | POA: Insufficient documentation

## 2023-08-01 DIAGNOSIS — I5022 Chronic systolic (congestive) heart failure: Secondary | ICD-10-CM | POA: Diagnosis not present

## 2023-08-01 DIAGNOSIS — I472 Ventricular tachycardia, unspecified: Secondary | ICD-10-CM | POA: Insufficient documentation

## 2023-08-01 DIAGNOSIS — Z4502 Encounter for adjustment and management of automatic implantable cardiac defibrillator: Secondary | ICD-10-CM | POA: Diagnosis present

## 2023-08-01 DIAGNOSIS — I447 Left bundle-branch block, unspecified: Secondary | ICD-10-CM | POA: Diagnosis not present

## 2023-08-01 DIAGNOSIS — Z8674 Personal history of sudden cardiac arrest: Secondary | ICD-10-CM | POA: Insufficient documentation

## 2023-08-01 DIAGNOSIS — I130 Hypertensive heart and chronic kidney disease with heart failure and stage 1 through stage 4 chronic kidney disease, or unspecified chronic kidney disease: Secondary | ICD-10-CM | POA: Diagnosis not present

## 2023-08-01 HISTORY — PX: BIV UPGRADE: EP1202

## 2023-08-01 HISTORY — PX: LEAD INSERTION: EP1212

## 2023-08-01 SURGERY — BIV UPGRADE

## 2023-08-01 MED ORDER — CEFAZOLIN SODIUM-DEXTROSE 1-4 GM/50ML-% IV SOLN: 1.0000 g | Freq: Once | INTRAVENOUS | Status: DC

## 2023-08-01 MED ORDER — CHLORHEXIDINE GLUCONATE 4 % EX SOLN
4.0000 | Freq: Once | CUTANEOUS | Status: DC
  Filled 2023-08-01: qty 60

## 2023-08-01 MED ORDER — MIDAZOLAM HCL 2 MG/2ML IJ SOLN
INTRAMUSCULAR | Status: AC
  Filled 2023-08-01: qty 2

## 2023-08-01 MED ORDER — IOHEXOL 350 MG/ML SOLN
INTRAVENOUS | Status: DC | PRN
  Administered 2023-08-01: 25 mL

## 2023-08-01 MED ORDER — FENTANYL CITRATE (PF) 100 MCG/2ML IJ SOLN
INTRAMUSCULAR | Status: AC
  Filled 2023-08-01: qty 2

## 2023-08-01 MED ORDER — CEFAZOLIN SODIUM-DEXTROSE 2-4 GM/100ML-% IV SOLN: 2.0000 g | INTRAVENOUS | Status: AC

## 2023-08-01 MED ORDER — LIDOCAINE HCL (PF) 1 % IJ SOLN
INTRAMUSCULAR | Status: AC
  Filled 2023-08-01: qty 60

## 2023-08-01 MED ORDER — CEFAZOLIN SODIUM-DEXTROSE 2-4 GM/100ML-% IV SOLN
INTRAVENOUS | Status: AC
  Administered 2023-08-01: 2 g via INTRAVENOUS
  Filled 2023-08-01: qty 100

## 2023-08-01 MED ORDER — POVIDONE-IODINE 10 % EX SWAB
2.0000 | Freq: Once | CUTANEOUS | Status: AC
  Administered 2023-08-01: 2 via TOPICAL

## 2023-08-01 MED ORDER — LIDOCAINE HCL (PF) 1 % IJ SOLN
INTRAMUSCULAR | Status: DC | PRN
  Administered 2023-08-01: 30 mL

## 2023-08-01 MED ORDER — FENTANYL CITRATE (PF) 100 MCG/2ML IJ SOLN
INTRAMUSCULAR | Status: DC | PRN
  Administered 2023-08-01 (×5): 12.5 ug via INTRAVENOUS
  Administered 2023-08-01: 25 ug via INTRAVENOUS
  Administered 2023-08-01: 12.5 ug via INTRAVENOUS

## 2023-08-01 MED ORDER — ACETAMINOPHEN 325 MG PO TABS: 325.0000 mg | ORAL_TABLET | ORAL | Status: DC | PRN

## 2023-08-01 MED ORDER — MIDAZOLAM HCL 5 MG/5ML IJ SOLN
INTRAMUSCULAR | Status: DC | PRN
  Administered 2023-08-01 (×3): 1 mg via INTRAVENOUS
  Administered 2023-08-01: 2 mg via INTRAVENOUS
  Administered 2023-08-01: 1 mg via INTRAVENOUS

## 2023-08-01 MED ORDER — HEPARIN (PORCINE) IN NACL 2000-0.9 UNIT/L-% IV SOLN
INTRAVENOUS | Status: DC | PRN
  Administered 2023-08-01: 1000 mL

## 2023-08-01 MED ORDER — SODIUM CHLORIDE 0.9 % IV SOLN: INTRAVENOUS | Status: DC

## 2023-08-01 MED ORDER — ONDANSETRON HCL 4 MG/2ML IJ SOLN: 4.0000 mg | Freq: Four times a day (QID) | INTRAMUSCULAR | Status: DC | PRN

## 2023-08-01 MED ORDER — SODIUM CHLORIDE 0.9 % IV SOLN
80.0000 mg | INTRAVENOUS | Status: AC
  Administered 2023-08-01: 80 mg

## 2023-08-01 MED ORDER — SODIUM CHLORIDE 0.9 % IV SOLN
INTRAVENOUS | Status: AC
  Administered 2023-08-01: 80 mg
  Filled 2023-08-01: qty 2

## 2023-08-01 SURGICAL SUPPLY — 20 items
BALLOON ATTAIN 80 (BALLOONS) IMPLANT
CABLE SURGICAL S-101-97-12 (CABLE) ×1 IMPLANT
CATH ATTAIN COM SURV 6250V-MB2 (CATHETERS) IMPLANT
CATH JOSEPH QUAD ALLRED 6F REP (CATHETERS) IMPLANT
ELECT DEFIB PAD ADLT CADENCE (PAD) IMPLANT
ICD COBALT XT QUAD CRT DTPA2QQ (ICD Generator) IMPLANT
KIT ESSENTIALS PG (KITS) IMPLANT
LEAD ATTAIN PERFORM ST 4398-88 (Lead) IMPLANT
LEAD CAPSURE NOVUS 5076-52CM (Lead) IMPLANT
PAD DEFIB RADIO PHYSIO CONN (PAD) ×1 IMPLANT
POUCH AIGIS-R ANTIBACT ICD (Mesh General) ×1 IMPLANT
POUCH AIGIS-R ANTIBACT ICD LRG (Mesh General) IMPLANT
SHEATH 7FR PRELUDE SNAP 13 (SHEATH) IMPLANT
SHEATH 9FR PRELUDE SNAP 13 (SHEATH) IMPLANT
SHEATH WORLEY 9FR 62CM (SHEATH) IMPLANT
SLITTER 6232ADJ (MISCELLANEOUS) IMPLANT
TRAY PACEMAKER INSERTION (PACKS) ×1 IMPLANT
WIRE ACUITY WHISPER EDS 4648 (WIRE) IMPLANT
WIRE ASAHI PROWATER 180CM (WIRE) IMPLANT
WIRE HI TORQ VERSACORE-J 145CM (WIRE) IMPLANT

## 2023-08-01 NOTE — H&P (Signed)
 Electrophysiology Office Note:   Date:  07/25/2023  ID:  DAZIEL KILDAY, DOB 09-Aug-1960, MRN [id number]   Primary Cardiologist: None Primary Heart Failure: Peder Bourdon, MD Electrophysiologist: Richardo Chandler, MD       History of Present Illness:   Brian Noble is a 63 y.o. male with h/o CKD, chronic systolic heart failure, hypertension, VF arrest seen today for routine electrophysiology followup.    Since last being seen in our clinic the patient reports doing overall well.  He did have ICD shock in April.  He has had multiple shocks in the past.  He is feeling well currently.  He has no chest pain or shortness of breath.  He is able to do his daily activities without restriction.  he denies chest pain, palpitations, dyspnea, PND, orthopnea, nausea, vomiting, dizziness, syncope, edema, weight gain, or early satiety.    Review of systems complete and found to be negative unless listed in HPI.          EP Information / Studies Reviewed:     EKG is not ordered today. EKG from 07/20/23 reviewed which showed sinus rhythm, LBBB        ICD Interrogation-  reviewed in detail today,  See PACEART report.   Device History: Medtronic Single Chamber ICD implanted 2016 for aborted cardiac arrest History of appropriate therapy: Yes History of AAD therapy: Yes; currently on mexiletine    Risk Assessment/Calculations:              Physical Exam:   VS:  There were no vitals taken for this visit.      Wt Readings from Last 3 Encounters:  07/20/23 191 lb (86.6 kg)  05/25/23 191 lb (86.6 kg)  05/18/23 190 lb (86.2 kg)      GEN: Well nourished, well developed in no acute distress NECK: No JVD; No carotid bruits CARDIAC: Regular rate and rhythm, no murmurs, rubs, gallops RESPIRATORY:  Clear to auscultation without rales, wheezing or rhonchi  ABDOMEN: Soft, non-tender, non-distended EXTREMITIES:  No edema; No deformity    ASSESSMENT AND PLAN:     Chronic systolic dysfunction s/p  Medtronic single chamber ICD  euvolemic today Stable on an appropriate medical regimen Normal ICD function Sensing, threshold, impedance within normal limits Device parameter reviewed and stable for patient See Valeta Gaudier Art report He has left bundle branch block and recurrent ventricular arrhythmias.  He would benefit from CRT-D upgrade.  Risk and benefits have been discussed.  He understands the risks and is agreed to the procedure.  To get the procedure done more expeditiously, will have Dr. Carolynne Citron perform the procedure. No changes today   Explained risks, benefits, and alternatives to ICD implantation, including but not limited to bleeding, infection, pneumothorax, pericardial effusion, lead dislodgement, heart attack, stroke, or death.  Pt verbalized understanding and agrees to proceed.   2.  Ventricular tachycardia: Episodes occurred in April.  If he has further episodes, may warrant antiarrhythmics.  May improve with biventricular upgrade.   3.  Obstructive sleep apnea: Patient refuses CPAP   Disposition:   Follow up with EP APP as usual post procedure     Signed, Will Cortland Ding, MD   EP Attending  Patient seen and examined. Agree with above. The patient is known to me from prior consult in 2020, seen by Dr. Lawana Pray to expedite his care. He is a pleasant 63 yo man with VT, a longstading CM, chronic systolic heart failure and LBBB. His EF is EF is  20%. He has class 3 CHF. I have discussed the indications/risks/benefits/goals/expectations of biv ICD upgrade and he wishes to proceed.   Pete Brand Leroy Pettway,MD

## 2023-08-01 NOTE — Discharge Instructions (Addendum)
 After Your Cardiac Device   You have a medtronic device.  ACTIVITY Do not lift your arm above shoulder height for 1 week after your procedure. After 7 days, you may progress as below.  You should remove your sling 24 hours after your procedure, unless otherwise instructed by your provider.     Wednesday Aug 08, 2023  Thursday Aug 09, 2023 Friday Aug 10, 2023 Saturday Aug 11, 2023   Do not lift, push, pull, or carry anything over 10 pounds with the affected arm until 6 weeks (Wednesday September 12, 2023 ) after your procedure.   You may drive AFTER your wound check, unless you have been told otherwise by your provider.   Ask your healthcare provider when you can go back to work   INCISION/Dressing  If large square, outer bandage is left in place, this can be removed after 24 hours from your procedure. Do not remove steri-strips or glue as below.   If a PRESSURE DRESSING (a bulky dressing that usually goes up over your shoulder) was applied or left in place, please follow instructions given by your provider on when to return to have this removed.   Monitor your cardiac device site for redness, swelling, and drainage. Call the device clinic at [phone] if you experience these symptoms or fever/chills.  If your incision is sealed with Steri-strips or staples, you may shower 7 days after your procedure or when told by your provider. Do not remove the steri-strips or let the shower hit directly on your site. You may wash around your site with soap and water.    If you were discharged in a sling, please do not wear this during the day more than 48 hours after your surgery unless otherwise instructed. This may increase the risk of stiffness and soreness in your shoulder.   Avoid lotions, ointments, or perfumes over your incision until it is well-healed.  You may use a hot tub or a pool AFTER your wound check appointment if the incision is completely closed.   DEVICE MANAGEMENT You should  receive your ID card for your new device in 4-8 weeks. Keep this card with you at all times once received. Consider wearing a medical alert bracelet or necklace.  Please follow manufacture instructions for keeping your device charged carefully. This should be performed every 2 weeks at the very least.   Your cardiac device may be MRI compatible. This will be discussed at your next office visit/wound check.  You should avoid contact with strong electric or magnetic fields.    Call the office right away if: You have chest pain. You feel more short of breath than you have felt before. You feel more light-headed than you have felt before. Your incision starts to open up.  This information is not intended to replace advice given to you by your health care provider. Make sure you discuss any questions you have with your health care provider.

## 2023-08-01 NOTE — Progress Notes (Signed)
 MD taylor saw patient prior to CXR and spoke with him and his wife. MD said patient may be discharged after the cxr at 1900 and if he does not answer his phone with cxr results, he may be discharged if the the results are WNL.   MD did not answer his phone at 1810 so I left a voicemail and will plan to dc patient at 1900 based on radiologist reading.

## 2023-08-02 ENCOUNTER — Encounter (HOSPITAL_COMMUNITY): Payer: Self-pay | Admitting: Internal Medicine

## 2023-08-02 MED FILL — Midazolam HCl Inj 2 MG/2ML (Base Equivalent): INTRAMUSCULAR | Qty: 4 | Status: AC

## 2023-08-02 MED FILL — Midazolam HCl Inj 2 MG/2ML (Base Equivalent): INTRAMUSCULAR | Qty: 2 | Status: AC

## 2023-08-11 ENCOUNTER — Other Ambulatory Visit: Payer: Self-pay | Admitting: Family Medicine

## 2023-08-11 DIAGNOSIS — E782 Mixed hyperlipidemia: Secondary | ICD-10-CM

## 2023-08-15 NOTE — Addendum Note (Signed)
 Addended by: Lott Rouleau A on: 08/15/2023 02:27 PM   Modules accepted: Orders

## 2023-08-15 NOTE — Progress Notes (Signed)
 Remote ICD transmission.

## 2023-08-16 ENCOUNTER — Ambulatory Visit: Attending: Internal Medicine

## 2023-08-16 ENCOUNTER — Encounter: Payer: Self-pay | Admitting: Internal Medicine

## 2023-08-16 DIAGNOSIS — I428 Other cardiomyopathies: Secondary | ICD-10-CM | POA: Diagnosis not present

## 2023-08-16 DIAGNOSIS — I5022 Chronic systolic (congestive) heart failure: Secondary | ICD-10-CM

## 2023-08-16 DIAGNOSIS — I472 Ventricular tachycardia, unspecified: Secondary | ICD-10-CM

## 2023-08-16 NOTE — Progress Notes (Addendum)
 Normal CRT-D chamber ICD wound check. Wound well healed. There is a small amount of swelling over device area that is decreasing in size since implant.  No signs of infection or active bleeding.  Patient care and monitoring instructions given, okay to apply a cold compress, protecting skin to assist with further healing.  Patient is to call the device clinic if any concerning signs develop.   Presenting rhythm: AP/VP 60 .  BVP: 93.5%.   Routine testing performed. Thresholds, sensing, and impedances consistent with implant measurements with 3.5V safety margin/auto capture until 3 month visit. No treated arrhythmias.  16 NSVT, longest 4 sec duration with 20 beats.  Patient occasionally feels fast heart rates with activity. No missed doses of Carvedilol  or Mexiletine. Reviewed arm restrictions to continue for 6 weeks total post op. Reviewed shock plan.  Pt enrolled in remote follow-up.  Patient's work requires heavy lifting and he is unable to do a modified version respective lift/push/pull restrictions.  Reviewed with Dr. Carolynne Citron.  Patient is being written out of work and may return after 6 week period on 6/26.  Patient agrees with plan. Work note given today, patient will bring by [facility] paperwork for physician to complete.

## 2023-08-16 NOTE — Patient Instructions (Signed)
   After Your ICD (Implantable Cardiac Defibrillator)    Monitor your defibrillator site for redness, swelling, and drainage. Call the device clinic at [phone] if you experience these symptoms or fever/chills.  Your incision was closed with Steri-strips or staples:  You may shower 7 days after your procedure and wash your incision with soap and water. Avoid lotions, ointments, or perfumes over your incision until it is well-healed.  You may use a hot tub or a pool after your wound check appointment if the incision is completely closed.  Do not lift, push or pull greater than 10 pounds with the affected arm until 6 weeks after your procedure. UNTIL AFTER JUNE 25TH. There are no other restrictions in arm movement after your wound check appointment.  Your ICD is designed to protect you from life threatening heart rhythms. Because of this, you may receive a shock.   1 shock with no symptoms:  Call the office during business hours. 1 shock with symptoms (chest pain, chest pressure, dizziness, lightheadedness, shortness of breath, overall feeling unwell):  Call 911. If you experience 2 or more shocks in 24 hours:  Call 911. If you receive a shock, you should not drive.  [place] DMV - no driving for 6 months if you receive appropriate therapy from your ICD.   ICD Alerts:  Some alerts are vibratory and others beep. These are NOT emergencies. Please call our office to let us  know. If this occurs at night or on weekends, it can wait until the next business day. Send a remote transmission.  If your device is capable of reading fluid status (for heart failure), you will be offered monthly monitoring to review this with you.   Remote monitoring is used to monitor your ICD from home. This monitoring is scheduled every 91 days by our office. It allows us  to keep an eye on the functioning of your device to ensure it is working properly. You will routinely see your Electrophysiologist annually (more often  if necessary).

## 2023-08-17 ENCOUNTER — Other Ambulatory Visit: Payer: Self-pay

## 2023-08-17 ENCOUNTER — Telehealth: Payer: Self-pay

## 2023-08-17 ENCOUNTER — Telehealth: Payer: Self-pay | Admitting: Internal Medicine

## 2023-08-17 DIAGNOSIS — Z0279 Encounter for issue of other medical certificate: Secondary | ICD-10-CM

## 2023-08-17 NOTE — Telephone Encounter (Signed)
 Patient's spouse, Debria Fang, brought in a [employer] disability form for [place].  She paid the $29 form fee.  She took the release of information form to have Myking sign to release information to her (on behalf of [employer]).  I will compare past signatures to ensure that he did in fact sign the document.  Form is in Dr. Meridith Stanford box.

## 2023-08-17 NOTE — Telephone Encounter (Signed)
Paperwork completed and placed in out-box

## 2023-08-17 NOTE — Telephone Encounter (Signed)
 Patient in yesterday for 14 day follow up post BIV ICD upgrade.  Flagging patient has had 19 NSVT events 5/22 - 5/27, longest in duration was 20 beats. Rates at 200 and greater.  (VT treatment zone's begin at 200bpm).   Patient states he has been feeling some "fast heart beats" and in church on Sunday thought he might pass out; however, there were no recorded events at that time. Overall heart rates appear controlled on histograms with low burden of ventricular elevated rates.  He is taking his carvedilol  and Mexiletine without any missed doses and has a long history of frequent short NSVT runs and recent shock in past few months .  Forwarding to Dr. Carolynne Citron for review if any adjustments are needed.

## 2023-08-20 NOTE — Telephone Encounter (Signed)
 [employer] disability form given to spouse, Debria Fang (per R.O.I. to her).  Billing notified.

## 2023-08-23 NOTE — Telephone Encounter (Signed)
 Lets watch and wait and see how he does. I do not want to increase mexitil just yet.

## 2023-08-27 NOTE — Telephone Encounter (Signed)
 Followed up with patient and made him aware.  Will continue to monitor.

## 2023-08-31 ENCOUNTER — Encounter: Admitting: Internal Medicine

## 2023-09-07 ENCOUNTER — Ambulatory Visit: Admission: RE | Admit: 2023-09-07 | Source: Ambulatory Visit

## 2023-09-28 ENCOUNTER — Ambulatory Visit

## 2023-09-28 VITALS — BP 126/82 | HR 80 | Temp 98.1°F | Ht 68.0 in | Wt 199.0 lb

## 2023-09-28 DIAGNOSIS — E782 Mixed hyperlipidemia: Secondary | ICD-10-CM | POA: Diagnosis not present

## 2023-09-28 DIAGNOSIS — N1831 Chronic kidney disease, stage 3a: Secondary | ICD-10-CM

## 2023-09-28 DIAGNOSIS — I472 Ventricular tachycardia, unspecified: Secondary | ICD-10-CM | POA: Diagnosis not present

## 2023-09-28 DIAGNOSIS — I110 Hypertensive heart disease with heart failure: Secondary | ICD-10-CM | POA: Diagnosis not present

## 2023-09-28 DIAGNOSIS — I5022 Chronic systolic (congestive) heart failure: Secondary | ICD-10-CM

## 2023-09-28 NOTE — Patient Instructions (Addendum)
 VISIT SUMMARY:  You came in for a follow-up after your AICD upgrade. You reported feeling more energetic since the procedure and have been adhering to your medication regimen. We discussed your current health status, exercise, diet, and the importance of rescheduling your missed echocardiogram.  YOUR PLAN:  VENTRICULAR TACHYCARDIA WITH BIVENTRICULAR ICD: You had an upgrade of your AICD to a biventricular ICD with an additional lead insertion. You have been feeling more energetic since the procedure. -Continue taking your current cardiac medications: aspirin  81 mg daily, carvedilol  18.5 mg twice daily, Jardiance  10 mg daily, Entresto  daily, Inspira daily, Lasix  20 mg, Mexiletine 200 mg twice daily. -Monitor for any episodes of fast heartbeats or palpitations and report them to your cardiologist. -Reschedule the missed echocardiogram from June 20th. -Follow up with cardiology on August 14th and with Dr. Cathlyn Birmingham on September 12th.  GENERAL HEALTH MAINTENANCE: You maintain good health with regular exercise and a diet that includes greens and vegetables, but you often consume leftovers for breakfast. -Continue regular physical activity. -Try to maintain a balanced diet and reduce intake of unhealthy foods. -Schedule fasting blood work for the week of September 15th.                        Contains text generated by Abridge.                                 Contains text generated by Abridge.

## 2023-09-28 NOTE — Assessment & Plan Note (Signed)
 Possibly secondary to chronic hypertension. Not a diabetic. Stable CKD3 Will continue to monitor

## 2023-09-28 NOTE — Assessment & Plan Note (Signed)
-   Order cholesterol panel next visit.  - continue MEVACOR  20 MG daily at this time.

## 2023-09-28 NOTE — Assessment & Plan Note (Signed)
 Continue guidelines directed medical therapy with Aspirin  81 mg daily, Coreg  18.75 mg TWICE DAILY., Lasix  40 mg daily, Jardiance  10 mg daily, mevacor  20 mg daily, Entresto  97-103 mg TWICE DAILY.  Due for another echo. Normal physical exam today. Normal BP. Well compensated HFrEF s/p CRT-ICD.  Continue to follow up with cardiology

## 2023-09-28 NOTE — Progress Notes (Signed)
 Subjective:  Patient ID: Brian Noble, male    DOB: [birth year]  Age: 63 y.o. MRN: [id number]  Chief Complaint  Patient presents with   Medical Management of Chronic Issues    HPI: Discussed the use of AI scribe software for clinical note transcription with the patient, who gave verbal consent to proceed.  History of Present Illness   The patient is a 63 year old with ventricular tachycardia who presents for follow-up after an AICD upgrade.  Ventricular tachyarrhythmia and aicd management - Ventricular tachycardia with prior single chamber ICD placement - Underwent biventricular ICD upgrade with lead insertion recently - Experienced ICD shock in April prior to upgrade and has had multiple shocks in the past - Recalls fast heartbeats and a near syncopal episode in church prior to ICD upgrade - Since upgrade, feels significantly more energetic, describing it as 'a 63 year old inside of me' - Uncertain if any further episodes of fast heartbeats or palpitations since the procedure  Medication adherence - Current medications: aspirin  81 mg daily, carvedilol  18.5 mg twice daily, Jardiance  10 mg daily, Entresto  daily, Inspira daily, Lasix  20 mg, Mexiletine 200 mg twice daily - No missed doses; confirms adherence to medication regimen  Exercise tolerance and physical activity - Exercises daily, primarily walking on a treadmill - Job involves regular walking - No falls in the last six months  Dietary habits - Does not eat particularly healthily, often consumes leftovers for breakfast - Does not add salt to food - Primarily drinks water  Sleep and substance use - No history of sleep apnea and no sleep study performed. He refused the test. - Does not smoke or consume alcohol         05/25/2023    9:05 AM 11/24/2022    8:58 AM 07/15/2020    9:12 AM 08/04/2019    8:03 AM  Depression screen PHQ 2/9  Decreased Interest 0 0 0 0  Down, Depressed, Hopeless 0 0 0 0  PHQ - 2 Score 0 0 0  0  Altered sleeping 0 0    Tired, decreased energy 0 0    Change in appetite 0 0    Feeling bad or failure about yourself  0 0    Trouble concentrating 0 0    Moving slowly or fidgety/restless 0 0    Suicidal thoughts 0 0    PHQ-9 Score 0 0    Difficult doing work/chores Not difficult at all Not difficult at all          05/25/2023    9:05 AM  Fall Risk   Falls in the past year? 0  Number falls in past yr: 0  Injury with Fall? 0  Risk for fall due to : No Fall Risks  Follow up Falls evaluation completed    Patient Care Team: Llana Deshazo, MD as PCP - General (Family Medicine) Brian Ezra RAMAN, MD as PCP - Advanced Heart Failure (Cardiology) [name], MD as PCP - Electrophysiology (Cardiology)   Review of Systems  Constitutional: Negative.   HENT: Negative.    Eyes: Negative.   Respiratory: Negative.    Gastrointestinal: Negative.   Endocrine: Negative.   Genitourinary: Negative.   Musculoskeletal: Negative.   Neurological: Negative.   Psychiatric/Behavioral: Negative.    All other systems reviewed and are negative.   Current Outpatient Medications on File Prior to Visit  Medication Sig Dispense Refill   albuterol (VENTOLIN HFA) 108 (90 Base) MCG/ACT inhaler Inhale 1  puff into the lungs every 4 (four) hours as needed for wheezing.     aspirin  EC 81 MG tablet Take 81 mg by mouth daily.     budesonide (PULMICORT) 0.5 MG/2ML nebulizer solution      carvedilol  (COREG ) 12.5 MG tablet TAKE 1 AND 1/2 TABLETS (18.75 MG TOTAL) BY MOUTH 2 (TWO) TIMES DAILY. 270 tablet 3   empagliflozin  (JARDIANCE ) 10 MG TABS tablet Take 1 tablet (10 mg total) by mouth daily. 30 tablet 11   ENTRESTO  97-103 MG TAKE 1 TABLET BY MOUTH TWICE A DAY 60 tablet 4   eplerenone  (INSPRA ) 50 MG tablet TAKE 1 TABLET BY MOUTH EVERY DAY 90 tablet 3   furosemide  (LASIX ) 20 MG tablet Take 2 tablets (40 mg total) by mouth daily. 60 tablet 11   lovastatin  (MEVACOR ) 20 MG tablet TAKE 1 TABLET BY MOUTH  EVERY DAY 90 tablet 0   mexiletine (MEXITIL) 200 MG capsule TAKE 1 CAPSULE BY MOUTH TWICE A DAY 180 capsule 2   sildenafil  (REVATIO ) 20 MG tablet TAKE 1/2 TABLET BY MOUTH DAILY AS NEEDED FOR ERECTILE DYSFUNCTION 10 tablet 6   No current facility-administered medications on file prior to visit.   Past Medical History:  Diagnosis Date   Arthritis    Chronic kidney disease    Chronic systolic CHF (congestive heart failure) (HCC)    a. Echo 8/16:  EF 10%, diff HK, Gr 1 DD, mild dilated aortic root, mild MR, mild LAE, severely reduced RVSF   Hypertension    ICD (implantable cardioverter-defibrillator) in place    NICM (nonischemic cardiomyopathy) (HCC)    a. LHC 8/16:  normal coronary arteries.    VF (ventricular fibrillation) (HCC)    s/p OOH cardiac arrest 8/16 >> s/p ICD   Past Surgical History:  Procedure Laterality Date   APPENDECTOMY     BIV UPGRADE N/A 08/01/2023   Procedure: BIV UPGRADE;  Surgeon: Waddell Danelle ORN, MD;  Location: [facility] INVASIVE CV LAB;  Service: Cardiovascular;  Laterality: N/A;   CARDIAC CATHETERIZATION N/A 11/10/2014   Procedure: Left Heart Cath and Coronary Angiography;  Surgeon: Dorn JINNY Lesches, MD;  Location: [facility] INVASIVE CV LAB;  Service: Cardiovascular;  Laterality: N/A;   EP IMPLANTABLE DEVICE N/A 11/16/2014   Procedure: ICD Implant;  Surgeon: Elspeth JAYSON Sage, MD;  Location: [facility] INVASIVE CV LAB;  Service: Cardiovascular;  Laterality: N/A;   LEAD INSERTION N/A 08/01/2023   Procedure: LEAD INSERTION;  Surgeon: Waddell Danelle ORN, MD;  Location: [facility] INVASIVE CV LAB;  Service: Cardiovascular;  Laterality: N/A;   RIGHT HEART CATH AND CORONARY ANGIOGRAPHY N/A 01/30/2018   Procedure: RIGHT HEART CATH AND CORONARY ANGIOGRAPHY;  Surgeon: Brian Ezra RAMAN, MD;  Location[facility] INVASIVE CV LAB;  Service: Cardiovascular;  Laterality: N/A;   ULTRASOUND GUIDANCE FOR VASCULAR ACCESS  01/30/2018   Procedure: Ultrasound Guidance For Vascular Access;  Surgeon: Brian Ezra RAMAN, MD;   Location: [facility] INVASIVE CV LAB;  Service: Cardiovascular;;    Family History  Problem Relation Age of Onset   Hypertension Mother    Hypertension Father    Sudden death Father    Diabetes Sister    Heart disease Sister    Heart disease Sister    Hypertension Sister    Hypertension Sister    Hypertension Sister    Hypertension Brother    Hypertension Brother    Hypertension Brother    Hypertension Brother    Social History   Socioeconomic History   Marital status: Married  Spouse name: Not on file   Number of children: 2   Years of education: 61   Highest education level: 12th grade  Occupational History   Not on file  Tobacco Use   Smoking status: Never   Smokeless tobacco: Former    Types: Chew    Quit date: 2000  Vaping Use   Vaping status: Never Used  Substance and Sexual Activity   Alcohol use: No   Drug use: No   Sexual activity: Yes    Partners: Female  Other Topics Concern   Not on file  Social History Narrative   Not on file   Social Drivers of Health   Financial Resource Strain: Low Risk  (02/19/2018)   Overall Financial Resource Strain (CARDIA)    Difficulty of Paying Living Expenses: Not hard at all  Food Insecurity: No Food Insecurity (09/28/2023)   Hunger Vital Sign    Worried About Running Out of Food in the Last Year: Never true    Ran Out of Food in the Last Year: Never true  Transportation Needs: No Transportation Needs (09/28/2023)   PRAPARE - Administrator, Civil Service (Medical): No    Lack of Transportation (Non-Medical): No  Physical Activity: Not on file  Stress: Not on file  Social Connections: Unknown (08/02/2021)   Received from [facility]   Social Network    Social Network: Not on file    Objective:  BP 126/82   Pulse 80   Temp 98.1 F (36.7 C)   Ht 5' 8 (1.727 m)   Wt 199 lb (90.3 kg)   SpO2 97%   BMI 30.26 kg/m      09/28/2023    8:08 AM 08/01/2023    6:30 PM 08/01/2023    6:15 PM  BP/Weight   Systolic BP 126 125 120  Diastolic BP 82 89 74  Wt. (Lbs) 199    BMI 30.26 kg/m2      Physical Exam Vitals and nursing note reviewed.  Constitutional:      Appearance: He is obese.  HENT:     Head: Normocephalic and atraumatic.     Nose: Nose normal.  Eyes:     Pupils: Pupils are equal, round, and reactive to light.  Cardiovascular:     Rate and Rhythm: Normal rate and regular rhythm.  Pulmonary:     Effort: Pulmonary effort is normal.     Breath sounds: Normal breath sounds.  Musculoskeletal:        General: No swelling. Normal range of motion.     Cervical back: Normal range of motion.  Neurological:     General: No focal deficit present.     Mental Status: He is alert and oriented to person, place, and time.  Psychiatric:        Mood and Affect: Mood normal.         Lab Results  Component Value Date   WBC 7.2 07/26/2023   HGB 16.5 07/26/2023   HCT 50.8 07/26/2023   PLT 216 07/26/2023   GLUCOSE 97 07/20/2023   CHOL 161 05/25/2023   TRIG 173 (H) 05/25/2023   HDL 41 05/25/2023   LDLCALC 90 05/25/2023   ALT 39 07/30/2020   AST 32 07/30/2020   NA 139 07/20/2023   K 4.3 07/20/2023   CL 104 07/20/2023   CREATININE 1.14 07/20/2023   BUN 21 07/20/2023   CO2 26 07/20/2023   TSH 1.020 03/24/2022   INR 0.97 01/29/2018  HGBA1C 4.9 03/24/2022      Assessment & Plan:  Stage 3a chronic kidney disease (HCC) Assessment & Plan: Possibly secondary to chronic hypertension. Not a diabetic. Stable CKD3 Will continue to monitor   Hypertensive heart disease with chronic systolic congestive heart failure (HCC) Assessment & Plan: Continue guidelines directed medical therapy with Aspirin  81 mg daily, Coreg  18.75 mg TWICE DAILY., Lasix  40 mg daily, Jardiance  10 mg daily, mevacor  20 mg daily, Entresto  97-103 mg TWICE DAILY.  Due for another echo. Normal physical exam today. Normal BP. Well compensated HFrEF s/p CRT-ICD.  Continue to follow up with  cardiology   Mixed hyperlipidemia Assessment & Plan:  - Order cholesterol panel next visit.  - continue MEVACOR  20 MG daily at this time.    Ventricular tachycardia [facility]) Assessment & Plan: He underwent an upgrade of his AICD to a biventricular ICD with an additional lead insertion in MAY 2025. Post-procedure, he reports increased energy levels, though initially experienced fast heartbeats, likely due to device adjustment. He adheres to his cardiac medication regimen and has not experienced recent falls or syncope. He missed a scheduled echocardiogram on June 20th, crucial for post-procedure cardiac function assessment. - Continue current cardiac medications: aspirin  81 mg daily, carvedilol  18.5 mg twice daily, Jardiance  10 mg daily, Entresto  daily, Inspira daily, Lasix  20 mg, Mexiletine 200 mg twice daily. - Monitor for episodes of tachycardia or palpitations and report to cardiology if he occurs. - Reschedule the missed echocardiogram from June 20th. Advised to call cardiology to schedule.  - Follow up with cardiology on August 14th and with Dr. Cathlyn Birmingham on September 12th.    General Health Maintenance He maintains good health with well-controlled blood pressure, regular exercise through walking, and a diet including greens and vegetables while avoiding excessive salt and soda. However, he often consumes leftovers for breakfast, indicating inconsistent healthy eating habits. - Encourage regular physical activity and a balanced diet. - Advise on healthy eating habits and reducing intake of unhealthy foods. - Schedule fasting blood work for the week of September 15th.          No orders of the defined types were placed in this encounter.   No orders of the defined types were placed in this encounter.   Assessment and Plan           Follow-up: No follow-ups on file.  SABRA   An After Visit Summary was printed and given to the patient.  Boniface Goffe, MD [facility] [phone]

## 2023-09-28 NOTE — Assessment & Plan Note (Addendum)
 He underwent an upgrade of his AICD to a biventricular ICD with an additional lead insertion in MAY 2025. Post-procedure, he reports increased energy levels, though initially experienced fast heartbeats, likely due to device adjustment. He adheres to his cardiac medication regimen and has not experienced recent falls or syncope. He missed a scheduled echocardiogram on June 20th, crucial for post-procedure cardiac function assessment. - Continue current cardiac medications: aspirin  81 mg daily, carvedilol  18.5 mg twice daily, Jardiance  10 mg daily, Entresto  daily, Inspira daily, Lasix  20 mg, Mexiletine 200 mg twice daily. - Monitor for episodes of tachycardia or palpitations and report to cardiology if he occurs. - Reschedule the missed echocardiogram from June 20th. Advised to call cardiology to schedule.  - Follow up with cardiology on August 14th and with Dr. Cathlyn Birmingham on September 12th.    General Health Maintenance He maintains good health with well-controlled blood pressure, regular exercise through walking, and a diet including greens and vegetables while avoiding excessive salt and soda. However, he often consumes leftovers for breakfast, indicating inconsistent healthy eating habits. - Encourage regular physical activity and a balanced diet. - Advise on healthy eating habits and reducing intake of unhealthy foods. - Schedule fasting blood work for the week of September 15th.

## 2023-10-03 ENCOUNTER — Ambulatory Visit: Payer: BLUE CROSS/BLUE SHIELD

## 2023-10-08 ENCOUNTER — Telehealth: Payer: Self-pay

## 2023-10-08 NOTE — Telephone Encounter (Signed)
 Copied from CRM [id number]. Topic: Clinical - Medical Advice >> Oct 08, 2023 11:33 AM [name] wrote: Reason for CRM: Pt is wanting a call back to know what he can drink to put electrolytes back into his body with his medical condition

## 2023-10-12 ENCOUNTER — Ambulatory Visit (INDEPENDENT_AMBULATORY_CARE_PROVIDER_SITE_OTHER): Payer: Self-pay

## 2023-10-12 DIAGNOSIS — I428 Other cardiomyopathies: Secondary | ICD-10-CM | POA: Diagnosis not present

## 2023-10-12 LAB — CUP PACEART REMOTE DEVICE CHECK
Battery Remaining Longevity: 116 mo
Battery Voltage: 3.09 V
Brady Statistic AP VP Percent: 23.76 %
Brady Statistic AP VS Percent: 0.46 %
Brady Statistic AS VP Percent: 69.67 %
Brady Statistic AS VS Percent: 6.12 %
Brady Statistic RA Percent Paced: 25.73 %
Brady Statistic RV Percent Paced: 62.6 %
Date Time Interrogation Session: 20250725000241
HighPow Impedance: 64 Ohm
Implantable Lead Connection Status: 753985
Implantable Lead Connection Status: 753985
Implantable Lead Connection Status: 753985
Implantable Lead Implant Date: 20160829
Implantable Lead Implant Date: 20250514
Implantable Lead Implant Date: 20250514
Implantable Lead Location: 753858
Implantable Lead Location: 753859
Implantable Lead Location: 753860
Implantable Lead Model: 4398
Implantable Lead Model: 5076
Implantable Pulse Generator Implant Date: 20250514
Lead Channel Impedance Value: 323 Ohm
Lead Channel Impedance Value: 323 Ohm
Lead Channel Impedance Value: 380 Ohm
Lead Channel Impedance Value: 399 Ohm
Lead Channel Impedance Value: 418 Ohm
Lead Channel Impedance Value: 475 Ohm
Lead Channel Impedance Value: 475 Ohm
Lead Channel Impedance Value: 494 Ohm
Lead Channel Impedance Value: 589 Ohm
Lead Channel Impedance Value: 627 Ohm
Lead Channel Impedance Value: 646 Ohm
Lead Channel Impedance Value: 665 Ohm
Lead Channel Impedance Value: 684 Ohm
Lead Channel Pacing Threshold Amplitude: 0.375 V
Lead Channel Pacing Threshold Amplitude: 0.5 V
Lead Channel Pacing Threshold Amplitude: 0.625 V
Lead Channel Pacing Threshold Pulse Width: 0.4 ms
Lead Channel Pacing Threshold Pulse Width: 0.4 ms
Lead Channel Pacing Threshold Pulse Width: 0.4 ms
Lead Channel Sensing Intrinsic Amplitude: 2.6 mV
Lead Channel Sensing Intrinsic Amplitude: 24.3 mV
Lead Channel Setting Pacing Amplitude: 1.5 V
Lead Channel Setting Pacing Amplitude: 1.75 V
Lead Channel Setting Pacing Amplitude: 2 V
Lead Channel Setting Pacing Pulse Width: 0.4 ms
Lead Channel Setting Pacing Pulse Width: 0.4 ms
Lead Channel Setting Sensing Sensitivity: 0.3 mV
Zone Setting Status: 755011
Zone Setting Status: 755011
Zone Setting Status: 755011

## 2023-10-13 DIAGNOSIS — F5221 Male erectile disorder: Secondary | ICD-10-CM

## 2023-10-15 ENCOUNTER — Ambulatory Visit: Payer: Self-pay | Admitting: Cardiology

## 2023-10-15 MED ORDER — SILDENAFIL CITRATE 20 MG PO TABS: 10.0000 mg | ORAL_TABLET | Freq: Every day | ORAL | 1 refills | Status: AC | PRN

## 2023-11-01 ENCOUNTER — Encounter

## 2023-11-08 ENCOUNTER — Other Ambulatory Visit: Payer: Self-pay | Admitting: Family Medicine

## 2023-11-08 DIAGNOSIS — E782 Mixed hyperlipidemia: Secondary | ICD-10-CM

## 2023-11-25 ENCOUNTER — Other Ambulatory Visit (HOSPITAL_COMMUNITY): Payer: Self-pay | Admitting: Internal Medicine

## 2023-11-30 ENCOUNTER — Encounter: Payer: Self-pay | Admitting: Internal Medicine

## 2023-11-30 ENCOUNTER — Ambulatory Visit: Attending: Internal Medicine | Admitting: Internal Medicine

## 2023-11-30 VITALS — BP 114/64 | HR 65 | Ht 68.0 in | Wt 199.6 lb

## 2023-11-30 DIAGNOSIS — I472 Ventricular tachycardia, unspecified: Secondary | ICD-10-CM

## 2023-11-30 LAB — CUP PACEART INCLINIC DEVICE CHECK
Date Time Interrogation Session: 20250912143526
Implantable Lead Connection Status: 753985
Implantable Lead Connection Status: 753985
Implantable Lead Connection Status: 753985
Implantable Lead Implant Date: 20160829
Implantable Lead Implant Date: 20250514
Implantable Lead Implant Date: 20250514
Implantable Lead Location: 753858
Implantable Lead Location: 753859
Implantable Lead Location: 753860
Implantable Lead Model: 4398
Implantable Lead Model: 5076
Implantable Pulse Generator Implant Date: 20250514

## 2023-11-30 NOTE — Progress Notes (Signed)
 HPI Brian Noble returns today for followup. He is a pleasant middle aged man with a h/o chronic systolic heart failure, s/p ICD Insertion for secondary prevention. He returns today for evaluation. He has a h/o treated VT. He has not had syncope. He feels well otherwise. He has been on mexitil for several years. He has been on GDMT under the direction of Dr. Rolan. He underwent gen change out 3 months ago.  Allergies  Allergen Reactions   Prednisone Other (See Comments)     Current Outpatient Medications  Medication Sig Dispense Refill   albuterol (VENTOLIN HFA) 108 (90 Base) MCG/ACT inhaler Inhale 1 puff into the lungs every 4 (four) hours as needed for wheezing.     aspirin  EC 81 MG tablet Take 81 mg by mouth daily.     budesonide (PULMICORT) 0.5 MG/2ML nebulizer solution      carvedilol  (COREG ) 12.5 MG tablet TAKE 1 AND 1/2 TABLETS (18.75 MG TOTAL) BY MOUTH 2 (TWO) TIMES DAILY. 270 tablet 3   empagliflozin  (JARDIANCE ) 10 MG TABS tablet Take 1 tablet (10 mg total) by mouth daily. 30 tablet 11   eplerenone  (INSPRA ) 50 MG tablet TAKE 1 TABLET BY MOUTH EVERY DAY 90 tablet 3   furosemide  (LASIX ) 20 MG tablet Take 2 tablets (40 mg total) by mouth daily. 60 tablet 11   lovastatin  (MEVACOR ) 20 MG tablet TAKE 1 TABLET BY MOUTH EVERY DAY 90 tablet 0   mexiletine (MEXITIL) 200 MG capsule TAKE 1 CAPSULE BY MOUTH TWICE A DAY 180 capsule 2   sacubitril -valsartan  (ENTRESTO ) 97-103 MG TAKE 1 TABLET BY MOUTH TWICE A DAY 60 tablet 4   sildenafil  (REVATIO ) 20 MG tablet Take 0.5 tablets (10 mg total) by mouth daily as needed (sexual dysfunction). 15 tablet 1   No current facility-administered medications for this visit.     Past Medical History:  Diagnosis Date   Arthritis    Chronic kidney disease    Chronic systolic CHF (congestive heart failure) (HCC)    a. Echo 8/16:  EF 10%, diff HK, Gr 1 DD, mild dilated aortic root, mild MR, mild LAE, severely reduced RVSF   Hypertension    ICD  (implantable cardioverter-defibrillator) in place    NICM (nonischemic cardiomyopathy) (HCC)    a. LHC 8/16:  normal coronary arteries.    VF (ventricular fibrillation) (HCC)    s/p OOH cardiac arrest 8/16 >> s/p ICD    ROS:   All systems reviewed and negative except as noted in the HPI.   Past Surgical History:  Procedure Laterality Date   APPENDECTOMY     BIV UPGRADE N/A 08/01/2023   Procedure: BIV UPGRADE;  Surgeon: Waddell Danelle ORN, MD;  Location: [facility] INVASIVE CV LAB;  Service: Cardiovascular;  Laterality: N/A;   CARDIAC CATHETERIZATION N/A 11/10/2014   Procedure: Left Heart Cath and Coronary Angiography;  Surgeon: Dorn JINNY Lesches, MD;  Location: [facility] INVASIVE CV LAB;  Service: Cardiovascular;  Laterality: N/A;   EP IMPLANTABLE DEVICE N/A 11/16/2014   Procedure: ICD Implant;  Surgeon: Elspeth JAYSON Sage, MD;  Location: [facility] INVASIVE CV LAB;  Service: Cardiovascular;  Laterality: N/A;   LEAD INSERTION N/A 08/01/2023   Procedure: LEAD INSERTION;  Surgeon: Waddell Danelle ORN, MD;  Location: [facility] INVASIVE CV LAB;  Service: Cardiovascular;  Laterality: N/A;   RIGHT HEART CATH AND CORONARY ANGIOGRAPHY N/A 01/30/2018   Procedure: RIGHT HEART CATH AND CORONARY ANGIOGRAPHY;  Surgeon: [name], MD;  Location: [facility] INVASIVE CV  LAB;  Service: Cardiovascular;  Laterality: N/A;   ULTRASOUND GUIDANCE FOR VASCULAR ACCESS  01/30/2018   Procedure: Ultrasound Guidance For Vascular Access;  Surgeon: [name], MD;  Location: [facility] INVASIVE CV LAB;  Service: Cardiovascular;;     Family History  Problem Relation Age of Onset   Hypertension Mother    Hypertension Father    Sudden death Father    Diabetes Sister    Heart disease Sister    Heart disease Sister    Hypertension Sister    Hypertension Sister    Hypertension Sister    Hypertension Brother    Hypertension Brother    Hypertension Brother    Hypertension Brother      Social History   Socioeconomic History   Marital status: Married     Spouse name: Not on file   Number of children: 2   Years of education: 12   Highest education level: 12th grade  Occupational History   Not on file  Tobacco Use   Smoking status: Never   Smokeless tobacco: Former    Types: Chew    Quit date: 2000  Vaping Use   Vaping status: Never Used  Substance and Sexual Activity   Alcohol use: No   Drug use: No   Sexual activity: Yes    Partners: Female  Other Topics Concern   Not on file  Social History Narrative   Not on file   Social Drivers of Health   Financial Resource Strain: Low Risk  (02/19/2018)   Overall Financial Resource Strain (CARDIA)    Difficulty of Paying Living Expenses: Not hard at all  Food Insecurity: No Food Insecurity (09/28/2023)   Hunger Vital Sign    Worried About Running Out of Food in the Last Year: Never true    Ran Out of Food in the Last Year: Never true  Transportation Needs: No Transportation Needs (09/28/2023)   PRAPARE - Administrator, Civil Service (Medical): No    Lack of Transportation (Non-Medical): No  Physical Activity: Not on file  Stress: Not on file  Social Connections: Unknown (08/02/2021)   Received from [facility]   Social Network    Social Network: Not on file  Intimate Partner Violence: Not At Risk (09/28/2023)   Humiliation, Afraid, Rape, and Kick questionnaire    Fear of Current or Ex-Partner: No    Emotionally Abused: No    Physically Abused: No    Sexually Abused: No     BP 114/64   Pulse 65   Ht 5' 8 (1.727 m)   Wt 199 lb 9.6 oz (90.5 kg)   SpO2 93%   BMI 30.35 kg/m   Physical Exam:  Well appearing 63 yo man, NAD HEENT: Unremarkable Neck:  No JVD, no thyromegally Lymphatics:  No adenopathy Back:  No CVA tenderness Lungs:  Clear with no wheezes HEART:  Regular rate rhythm, no murmurs, no rubs, no clicks Abd:  soft, positive bowel sounds, no organomegally, no rebound, no guarding Ext:  2 plus pulses, no edema, no cyanosis, no clubbing Skin:  No  rashes no nodules Neuro:  CN II through XII intact, motor grossly intact  EKG - NSR with biv pacing  DEVICE  Normal device function.  See PaceArt for details.   Assess/Plan:  VT - he will continue mexitil 200 mg twice daily 2. ICD - his medtronic device appears to be working normally. 3. Chronic systolic heart failure - he denies any symptoms of worsening  and his fluid index looks good.  4. HTN - his bp is controlled. No change. He will maintain a low sodium intake and his current meds.    Danelle Waddell HERO.D.

## 2023-11-30 NOTE — Patient Instructions (Signed)
 Medication Instructions:  Your physician recommends that you continue on your current medications as directed. Please refer to the Current Medication list given to you today.  *If you need a refill on your cardiac medications before your next appointment, please call your pharmacy*  Lab Work: None ordered.  You may go to any [facility] Location for your lab work:  [facility] - [address] ([facility]) - [address] [phone] [address]  [place] - [address]   [place]  - [address]   [place] - [address] - [address] [employer]  - [address] [place] - [address] ([facility]   If you have labs (blood work) drawn today and your tests are completely normal, you will receive your results only by: [employer] (if you have MyChart)  If you have any lab test that is abnormal or we need to change your treatment, we will call you or send a MyChart message to review the results.  Testing/Procedures: None ordered.  Follow-Up: At [facility], you and your health needs are our priority.  As part of our continuing mission to provide you with exceptional heart care, we have created designated Provider Care Teams.  These Care Teams include your primary Cardiologist (physician) and Advanced Practice Providers (APPs -  Physician Assistants and Nurse Practitioners) who all work together to provide you with the care you need, when you need it.   Your next appointment:   1 year(s)  The format for your next appointment:   In Person  Provider:   Soyla Norton, MD

## 2023-12-07 ENCOUNTER — Ambulatory Visit

## 2023-12-14 ENCOUNTER — Ambulatory Visit

## 2023-12-14 VITALS — BP 112/78 | HR 82 | Temp 98.4°F | Ht 68.0 in | Wt 197.8 lb

## 2023-12-14 DIAGNOSIS — N1831 Chronic kidney disease, stage 3a: Secondary | ICD-10-CM | POA: Diagnosis not present

## 2023-12-14 DIAGNOSIS — I10 Essential (primary) hypertension: Secondary | ICD-10-CM | POA: Diagnosis not present

## 2023-12-14 DIAGNOSIS — I5022 Chronic systolic (congestive) heart failure: Secondary | ICD-10-CM

## 2023-12-14 DIAGNOSIS — E782 Mixed hyperlipidemia: Secondary | ICD-10-CM | POA: Diagnosis not present

## 2023-12-14 DIAGNOSIS — I110 Hypertensive heart disease with heart failure: Secondary | ICD-10-CM | POA: Diagnosis not present

## 2023-12-14 NOTE — Assessment & Plan Note (Signed)
 Recently upgraded from a single chamber ICD to a biventricular ICD with lead insertion due to previous shocks. Post-upgrade, he reports feeling more energetic and has not experienced any chest pain or dyspnea. Compliant with medications, including aspirin , carvedilol , Jardiance , Entresto , Inspira, Lasix , and Maxalt. 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 - Continue current medications: aspirin , carvedilol , Jardiance , Entresto , Inspira, Lasix , Maxalt - Monitor for symptoms of heart failure such as chest pain or dyspnea - Encourage regular physical activity and adherence to medication regimen - Advise on dietary sodium restriction Orders:   CBC with Differential   Comprehensive metabolic panel with GFR   Magnesium 

## 2023-12-14 NOTE — Assessment & Plan Note (Signed)
 Well controlled Continue Coreg  18.75 twice daily Entresto  97-103 1 tablet twice a day Mevacor  20 mg once daily     Orders:   CBC with Differential   Magnesium 

## 2023-12-14 NOTE — Assessment & Plan Note (Signed)
 Kidney function slightly lower than normal but improved in May compared to March. Regular monitoring is necessary due to the medications he is taking. - Order basic labs to monitor kidney function - Continue monitoring kidney function regularly Orders:   Comprehensive metabolic panel with GFR   Magnesium 

## 2023-12-14 NOTE — Assessment & Plan Note (Signed)
 Mixed hyperlipidemia Slightly elevated triglycerides noted in the last cholesterol panel from March. Other aspects of the cholesterol panel were satisfactory. - Encourage dietary modifications to manage triglyceride levels  - continue MEVACOR  20 MG DAILY.    Orders:   Lipid Panel

## 2023-12-14 NOTE — Progress Notes (Signed)
 Subjective:  Patient ID: Brian Noble, male    DOB: 11/25/1960  Age: 63 y.o. MRN: [id number]  Chief Complaint  Patient presents with   Medical Management of Chronic Issues    HPI: Discussed the use of AI scribe software for clinical note transcription with the patient, who gave verbal consent to proceed.  Discussed the use of AI scribe software for clinical note transcription with the patient, who gave verbal consent to proceed.  History of Present Illness   Brian Noble is a 63 year old male who presents for follow-up after an AICD upgrade.  Implantable cardioverter defibrillator (icd) status and cardiac symptoms - AICD upgraded from single chamber ICD to biventricular ICD with lead insertion due to prior shocks - Since upgrade, increased energy level - No chest pain or dyspnea - No falls, leg pain, discomfort, or lower extremity swelling  Medication adherence - Adheres to prescribed regimen: aspirin , carvedilol , Jardiance , Entresto , Inspira, Lasix , Maxalt - No missed doses  Functional status - Employed in shipment receiving with associated physical activity - No symptoms during work-related activities  Laboratory findings - Last blood work in May: normal kidney function, normal blood counts, normal blood sugars - Cholesterol panel in March: slightly elevated triglycerides, otherwise normal  Lifestyle and weight changes - No tobacco or alcohol use - Slight decrease in weight since last visit            12/14/2023    9:07 AM 05/25/2023    9:05 AM 11/24/2022    8:58 AM 07/15/2020    9:12 AM 08/04/2019    8:03 AM  Depression screen PHQ 2/9  Decreased Interest 0 0 0 0 0  Down, Depressed, Hopeless 0 0 0 0 0  PHQ - 2 Score 0 0 0 0 0  Altered sleeping 0 0 0    Tired, decreased energy 0 0 0    Change in appetite 0 0 0    Feeling bad or failure about yourself  0 0 0    Trouble concentrating 0 0 0    Moving slowly or fidgety/restless 0 0 0    Suicidal thoughts 0 0 0     PHQ-9 Score 0 0 0    Difficult doing work/chores Not difficult at all Not difficult at all Not difficult at all          12/14/2023    9:07 AM  Fall Risk   Falls in the past year? 0  Number falls in past yr: 0  Injury with Fall? 0  Risk for fall due to : No Fall Risks  Follow up Falls evaluation completed    Patient Care Team: Evian Derringer, MD as PCP - General (Family Medicine) [name], MD as PCP - Advanced Heart Failure (Cardiology) [name], MD as PCP - Electrophysiology (Cardiology)   Review of Systems  Constitutional:  Negative for chills, fatigue and fever.  HENT:  Negative for congestion, ear pain, sinus pressure and sore throat.   Respiratory:  Negative for cough and shortness of breath.   Cardiovascular:  Negative for chest pain.  Gastrointestinal:  Negative for abdominal pain, constipation, diarrhea, nausea and vomiting.  Genitourinary:  Negative for dysuria and frequency.  Musculoskeletal:  Negative for arthralgias, back pain and myalgias.  Neurological:  Negative for dizziness and headaches.  Psychiatric/Behavioral:  Negative for dysphoric mood. The patient is not nervous/anxious.     Current Outpatient Medications on File Prior to Visit  Medication Sig Dispense Refill  albuterol (VENTOLIN HFA) 108 (90 Base) MCG/ACT inhaler Inhale 1 puff into the lungs every 4 (four) hours as needed for wheezing.     aspirin  EC 81 MG tablet Take 81 mg by mouth daily.     budesonide (PULMICORT) 0.5 MG/2ML nebulizer solution      carvedilol  (COREG ) 12.5 MG tablet TAKE 1 AND 1/2 TABLETS (18.75 MG TOTAL) BY MOUTH 2 (TWO) TIMES DAILY. 270 tablet 3   chlorhexidine  (PERIDEX ) 0.12 % solution Use as directed 15 mLs in the mouth or throat 2 (two) times daily.     empagliflozin  (JARDIANCE ) 10 MG TABS tablet Take 1 tablet (10 mg total) by mouth daily. 30 tablet 11   eplerenone  (INSPRA ) 50 MG tablet TAKE 1 TABLET BY MOUTH EVERY DAY 90 tablet 3   furosemide  (LASIX ) 20 MG  tablet Take 2 tablets (40 mg total) by mouth daily. 60 tablet 11   ibuprofen (ADVIL) 600 MG tablet Take 600 mg by mouth every 6 (six) hours as needed.     lovastatin  (MEVACOR ) 20 MG tablet TAKE 1 TABLET BY MOUTH EVERY DAY 90 tablet 0   mexiletine (MEXITIL) 200 MG capsule TAKE 1 CAPSULE BY MOUTH TWICE A DAY 180 capsule 2   sacubitril -valsartan  (ENTRESTO ) 97-103 MG TAKE 1 TABLET BY MOUTH TWICE A DAY 60 tablet 4   sildenafil  (REVATIO ) 20 MG tablet Take 0.5 tablets (10 mg total) by mouth daily as needed (sexual dysfunction). 15 tablet 1   No current facility-administered medications on file prior to visit.   Past Medical History:  Diagnosis Date   Arthritis    Chronic kidney disease    Chronic systolic CHF (congestive heart failure) (HCC)    a. Echo 8/16:  EF 10%, diff HK, Gr 1 DD, mild dilated aortic root, mild MR, mild LAE, severely reduced RVSF   Hypertension    ICD (implantable cardioverter-defibrillator) in place    NICM (nonischemic cardiomyopathy) (HCC)    a. LHC 8/16:  normal coronary arteries.    VF (ventricular fibrillation) (HCC)    s/p OOH cardiac arrest 8/16 >> s/p ICD   Past Surgical History:  Procedure Laterality Date   APPENDECTOMY     BIV UPGRADE N/A 08/01/2023   Procedure: BIV UPGRADE;  Surgeon: Waddell Danelle ORN, MD;  Location: [facility] INVASIVE CV LAB;  Service: Cardiovascular;  Laterality: N/A;   CARDIAC CATHETERIZATION N/A 11/10/2014   Procedure: Left Heart Cath and Coronary Angiography;  Surgeon: Dorn JINNY Lesches, MD;  Location: [facility] INVASIVE CV LAB;  Service: Cardiovascular;  Laterality: N/A;   EP IMPLANTABLE DEVICE N/A 11/16/2014   Procedure: ICD Implant;  Surgeon: Elspeth JAYSON Sage, MD;  Location: [facility] INVASIVE CV LAB;  Service: Cardiovascular;  Laterality: N/A;   LEAD INSERTION N/A 08/01/2023   Procedure: LEAD INSERTION;  Surgeon: Waddell Danelle ORN, MD;  Location: [facility] INVASIVE CV LAB;  Service: Cardiovascular;  Laterality: N/A;   RIGHT HEART CATH AND CORONARY ANGIOGRAPHY N/A  01/30/2018   Procedure: RIGHT HEART CATH AND CORONARY ANGIOGRAPHY;  Surgeon: [name], MD;  Location[facility] INVASIVE CV LAB;  Service: Cardiovascular;  Laterality: N/A;   ULTRASOUND GUIDANCE FOR VASCULAR ACCESS  01/30/2018   Procedure: Ultrasound Guidance For Vascular Access;  Surgeon: [name], MD;  Location: [facility] INVAS[facility] CV LAB;  Service: Cardiovascular;;    Family History  Problem Relation Age of Onset   Hypertension Mother    Hypertension Father    Sudden death Father    Diabetes Sister    Heart disease Sister  Heart disease Sister    Hypertension Sister    Hypertension Sister    Hypertension Sister    Hypertension Brother    Hypertension Brother    Hypertension Brother    Hypertension Brother    Social History   Socioeconomic History   Marital status: Married    Spouse name: Not on file   Number of children: 2   Years of education: 12   Highest education level: 12th grade  Occupational History   Not on file  Tobacco Use   Smoking status: Never   Smokeless tobacco: Former    Types: Chew    Quit date: 2000  Vaping Use   Vaping status: Never Used  Substance and Sexual Activity   Alcohol use: No   Drug use: No   Sexual activity: Yes    Partners: Female  Other Topics Concern   Not on file  Social History Narrative   Not on file   Social Drivers of Health   Financial Resource Strain: Low Risk  (02/19/2018)   Overall Financial Resource Strain (CARDIA)    Difficulty of Paying Living Expenses: Not hard at all  Food Insecurity: No Food Insecurity (09/28/2023)   Hunger Vital Sign    Worried About Running Out of Food in the Last Year: Never true    Ran Out of Food in the Last Year: Never true  Transportation Needs: No Transportation Needs (09/28/2023)   PRAPARE - Administrator, Civil Service (Medical): No    Lack of Transportation (Non-Medical): No  Physical Activity: Not on file  Stress: Not on file  Social Connections: Unknown  (08/02/2021)   Received from [facility]   Social Network    Social Network: Not on file    Objective:  BP 112/78   Pulse 82   Temp 98.4 F (36.9 C)   Ht 5' 8 (1.727 m)   Wt 197 lb 12.8 oz (89.7 kg)   BMI 30.08 kg/m      12/14/2023    9:03 AM 11/30/2023    2:12 PM 09/28/2023    8:08 AM  BP/Weight  Systolic BP 112 114 126  Diastolic BP 78 64 82  Wt. (Lbs) 197.8 199.6 199  BMI 30.08 kg/m2 30.35 kg/m2 30.26 kg/m2    Physical Exam Vitals and nursing note reviewed.  Constitutional:      Appearance: He is obese.  HENT:     Head: Normocephalic and atraumatic.  Cardiovascular:     Rate and Rhythm: Normal rate and regular rhythm.  Pulmonary:     Effort: Pulmonary effort is normal.     Breath sounds: Normal breath sounds.  Musculoskeletal:     Cervical back: Normal range of motion.  Neurological:     Mental Status: He is alert.         Lab Results  Component Value Date   WBC 7.2 07/26/2023   HGB 16.5 07/26/2023   HCT 50.8 07/26/2023   PLT 216 07/26/2023   GLUCOSE 97 07/20/2023   CHOL 161 05/25/2023   TRIG 173 (H) 05/25/2023   HDL 41 05/25/2023   LDLCALC 90 05/25/2023   ALT 39 07/30/2020   AST 32 07/30/2020   NA 139 07/20/2023   K 4.3 07/20/2023   CL 104 07/20/2023   CREATININE 1.14 07/20/2023   BUN 21 07/20/2023   CO2 26 07/20/2023   TSH 1.020 03/24/2022   INR 0.97 01/29/2018   HGBA1C 4.9 03/24/2022    Results for orders placed  or performed in visit on 11/30/23  CUP PACEART [facility] DEVICE CHECK   Collection Time: 11/30/23  2:35 PM  Result Value Ref Range   Pulse Generator Manufacturer MERM    Date Time Interrogation Session [id number]    Pulse Gen Model [id number] Cobalt XT HF Quad CRT-D MRI    Pulse Gen Serial Number [id number]    Clinic Name [facility]    Implantable Pulse Generator Type Cardiac Resynch Therapy Defibulator    Implantable Pulse Generator Implant Date [id number]    Implantable Lead Manufacturer [facility]    Implantable Lead  Model 4398 Attain Performa Straight    Implantable Lead Serial Number [id number]    Implantable Lead Implant Date 79749485    Implantable Lead Location Detail 1 APEX    Implantable Lead Special Function LEFT APEX    Implantable Lead Location [id number]    Implantable Lead Connection Status [id number]    Implantable Lead Manufacturer [employer]    Implantable Lead Model 5076 CapSureFix Novus MRI SureScan    Implantable Lead Serial Number [id number]    Implantable Lead Implant Date 79749485    Implantable Lead Location Detail 1 APPENDAGE    Implantable Lead Location [id number]    Implantable Lead Connection Status [id number]    Implantable Lead Manufacturer [facility]    Implantable Lead Model 442 855 5639 Sprint Quattro Secure S    Implantable Lead Serial Number [id number]    Implantable Lead Implant Date 79839170    Implantable Lead Location Detail 1 [employer]PEX    Implantable Lead Location [id number]    Implantable Lead Connection Status [id number]    Eval Rhythm AS/VS 56   .  Assessment & Plan:   Assessment & Plan Mixed hyperlipidemia Mixed hyperlipidemia Slightly elevated triglycerides noted in the last cholesterol panel from March. Other aspects of the cholesterol panel were satisfactory. - Encourage dietary modifications to manage triglyceride levels  - continue MEVACOR  20 MG DAILY.    Orders:   Lipid Panel  Stage 3a chronic kidney disease (HCC) Kidney function slightly lower than normal but improved in May compared to March. Regular monitoring is necessary due to the medications he is taking. - Order basic labs to monitor kidney function - Continue monitoring kidney function regularly Orders:   Comprehensive metabolic panel with GFR   Magnesium   Hypertensive heart disease with chronic systolic congestive heart failure (HCC) Recently upgraded from a single chamber ICD to a biventricular ICD with lead insertion due to previous shocks. Post-upgrade, he reports feeling more energetic and has not experienced any chest  pain or dyspnea. Compliant with medications, including aspirin , carvedilol , Jardiance , Entresto , Inspira, Lasix , and Maxalt. 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 - Continue current medications: aspirin , carvedilol , Jardiance , Entresto , Inspira, Lasix , Maxalt - Monitor for symptoms of heart failure such as chest pain or dyspnea - Encourage regular physical activity and adherence to medication regimen - Advise on dietary sodium restriction Orders:   CBC with Differential   Comprehensive metabolic panel with GFR   Magnesium   Essential hypertension, benign Well controlled Continue Coreg  18.75 twice daily Entresto  97-103 1 tablet twice a day Mevacor  20 mg once daily     Orders:   CBC with Differential   Magnesium      Body mass index is 30.08 kg/m.            No orders of the defined types were placed in this encounter.   Orders Placed This Encounter  Procedures  CBC with Differential   Comprehensive metabolic panel with GFR   Lipid Panel   Magnesium        Follow-up: Return in about 6 months (around 06/12/2024) for chronic disease follow up.  An After Visit Summary was printed and given to the patient.  Nava Song, MD [facility] [phone]

## 2023-12-15 LAB — COMPREHENSIVE METABOLIC PANEL WITH GFR
ALT: 26 IU/L (ref 0–44)
AST: 28 IU/L (ref 0–40)
Albumin: 4.5 g/dL (ref 3.9–4.9)
Alkaline Phosphatase: 57 IU/L (ref 47–123)
BUN/Creatinine Ratio: 16 (ref 10–24)
BUN: 22 mg/dL (ref 8–27)
Bilirubin Total: 1 mg/dL (ref 0.0–1.2)
CO2: 20 mmol/L (ref 20–29)
Calcium: 9.2 mg/dL (ref 8.6–10.2)
Chloride: 102 mmol/L (ref 96–106)
Creatinine, Ser: 1.34 mg/dL — ABNORMAL HIGH (ref 0.76–1.27)
Globulin, Total: 2.7 g/dL (ref 1.5–4.5)
Glucose: 83 mg/dL (ref 70–99)
Potassium: 4.7 mmol/L (ref 3.5–5.2)
Sodium: 139 mmol/L (ref 134–144)
Total Protein: 7.2 g/dL (ref 6.0–8.5)
eGFR: 60 mL/min/1.73 (ref >59)

## 2023-12-15 LAB — CBC WITH DIFFERENTIAL/PLATELET
Basophils Absolute: 0.1 x10E3/uL (ref 0.0–0.2)
Basos: 1 %
EOS (ABSOLUTE): 0.4 x10E3/uL (ref 0.0–0.4)
Eos: 5 %
Hematocrit: 47.6 % (ref 37.5–51.0)
Hemoglobin: 15.5 g/dL (ref 13.0–17.7)
Immature Grans (Abs): 0 x10E3/uL (ref 0.0–0.1)
Immature Granulocytes: 0 %
Lymphocytes Absolute: 1.5 x10E3/uL (ref 0.7–3.1)
Lymphs: 21 %
MCH: 30.2 pg (ref 26.6–33.0)
MCHC: 32.6 g/dL (ref 31.5–35.7)
MCV: 93 fL (ref 79–97)
Monocytes Absolute: 0.7 x10E3/uL (ref 0.1–0.9)
Monocytes: 10 %
Neutrophils Absolute: 4.4 x10E3/uL (ref 1.4–7.0)
Neutrophils: 63 %
Platelets: 223 x10E3/uL (ref 150–450)
RBC: 5.14 x10E6/uL (ref 4.14–5.80)
RDW: 12.3 % (ref 11.6–15.4)
WBC: 7.1 x10E3/uL (ref 3.4–10.8)

## 2023-12-15 LAB — LIPID PANEL
Chol/HDL Ratio: 4.5 ratio (ref 0.0–5.0)
Cholesterol, Total: 167 mg/dL (ref 100–199)
HDL: 37 mg/dL — ABNORMAL LOW (ref >39)
LDL Chol Calc (NIH): 90 mg/dL (ref 0–99)
Triglycerides: 236 mg/dL — ABNORMAL HIGH (ref 0–149)
VLDL Cholesterol Cal: 40 mg/dL (ref 5–40)

## 2023-12-15 LAB — MAGNESIUM: Magnesium: 2.2 mg/dL (ref 1.6–2.3)

## 2023-12-17 ENCOUNTER — Ambulatory Visit: Payer: Self-pay

## 2023-12-20 NOTE — Progress Notes (Signed)
 Remote ICD Transmission

## 2024-01-11 ENCOUNTER — Ambulatory Visit: Payer: Self-pay

## 2024-01-11 DIAGNOSIS — I472 Ventricular tachycardia, unspecified: Secondary | ICD-10-CM | POA: Diagnosis not present

## 2024-01-11 LAB — CUP PACEART REMOTE DEVICE CHECK
Battery Remaining Longevity: 110 mo
Battery Voltage: 3.02 V
Brady Statistic AP VP Percent: 20.57 %
Brady Statistic AP VS Percent: 0.06 %
Brady Statistic AS VP Percent: 74.89 %
Brady Statistic AS VS Percent: 4.49 %
Brady Statistic RA Percent Paced: 21.83 %
Brady Statistic RV Percent Paced: 95.43 %
Date Time Interrogation Session: 20251023201658
HighPow Impedance: 64 Ohm
Implantable Lead Connection Status: 753985
Implantable Lead Connection Status: 753985
Implantable Lead Connection Status: 753985
Implantable Lead Implant Date: 20160829
Implantable Lead Implant Date: 20250514
Implantable Lead Implant Date: 20250514
Implantable Lead Location: 753858
Implantable Lead Location: 753859
Implantable Lead Location: 753860
Implantable Lead Model: 4398
Implantable Lead Model: 5076
Implantable Pulse Generator Implant Date: 20250514
Lead Channel Impedance Value: 323 Ohm
Lead Channel Impedance Value: 323 Ohm
Lead Channel Impedance Value: 380 Ohm
Lead Channel Impedance Value: 399 Ohm
Lead Channel Impedance Value: 399 Ohm
Lead Channel Impedance Value: 456 Ohm
Lead Channel Impedance Value: 456 Ohm
Lead Channel Impedance Value: 475 Ohm
Lead Channel Impedance Value: 570 Ohm
Lead Channel Impedance Value: 608 Ohm
Lead Channel Impedance Value: 627 Ohm
Lead Channel Impedance Value: 646 Ohm
Lead Channel Impedance Value: 646 Ohm
Lead Channel Pacing Threshold Amplitude: 0.375 V
Lead Channel Pacing Threshold Amplitude: 0.625 V
Lead Channel Pacing Threshold Amplitude: 0.625 V
Lead Channel Pacing Threshold Pulse Width: 0.4 ms
Lead Channel Pacing Threshold Pulse Width: 0.4 ms
Lead Channel Pacing Threshold Pulse Width: 0.4 ms
Lead Channel Sensing Intrinsic Amplitude: 2.5 mV
Lead Channel Sensing Intrinsic Amplitude: 5.1 mV
Lead Channel Setting Pacing Amplitude: 1.5 V
Lead Channel Setting Pacing Amplitude: 1.75 V
Lead Channel Setting Pacing Amplitude: 2 V
Lead Channel Setting Pacing Pulse Width: 0.4 ms
Lead Channel Setting Pacing Pulse Width: 0.4 ms
Lead Channel Setting Sensing Sensitivity: 0.3 mV
Zone Setting Status: 755011
Zone Setting Status: 755011
Zone Setting Status: 755011

## 2024-01-14 NOTE — Progress Notes (Signed)
 Remote ICD Transmission

## 2024-01-17 ENCOUNTER — Ambulatory Visit: Payer: Self-pay | Admitting: Internal Medicine

## 2024-01-24 ENCOUNTER — Other Ambulatory Visit: Payer: Self-pay | Admitting: Family Medicine

## 2024-01-24 DIAGNOSIS — I472 Ventricular tachycardia, unspecified: Secondary | ICD-10-CM

## 2024-01-31 ENCOUNTER — Encounter

## 2024-02-07 ENCOUNTER — Other Ambulatory Visit: Payer: Self-pay | Admitting: Family Medicine

## 2024-02-07 ENCOUNTER — Other Ambulatory Visit (HOSPITAL_COMMUNITY): Payer: Self-pay | Admitting: Cardiology

## 2024-02-07 DIAGNOSIS — E782 Mixed hyperlipidemia: Secondary | ICD-10-CM

## 2024-02-25 ENCOUNTER — Telehealth (HOSPITAL_COMMUNITY): Payer: Self-pay | Admitting: Cardiology

## 2024-02-25 MED ORDER — ENTRESTO 97-103 MG PO TABS: 1.0000 | ORAL_TABLET | Freq: Two times a day (BID) | ORAL | 11 refills | Status: DC

## 2024-02-25 NOTE — Telephone Encounter (Signed)
 Wife called to request brand name entresto  with new insurance

## 2024-03-21 ENCOUNTER — Ambulatory Visit (HOSPITAL_COMMUNITY)
Admission: RE | Admit: 2024-03-21 | Discharge: 2024-03-21 | Disposition: A | Discharge status: Home / Self Care | Source: Ambulatory Visit | Attending: Cardiology | Admitting: Cardiology

## 2024-03-21 ENCOUNTER — Encounter (HOSPITAL_COMMUNITY): Payer: Self-pay | Admitting: Cardiology

## 2024-03-21 ENCOUNTER — Ambulatory Visit (HOSPITAL_COMMUNITY): Payer: Self-pay | Admitting: Cardiology

## 2024-03-21 VITALS — BP 118/80 | HR 60 | Ht 68.0 in | Wt 204.8 lb

## 2024-03-21 DIAGNOSIS — I5022 Chronic systolic (congestive) heart failure: Secondary | ICD-10-CM | POA: Diagnosis not present

## 2024-03-21 DIAGNOSIS — Z8674 Personal history of sudden cardiac arrest: Secondary | ICD-10-CM | POA: Insufficient documentation

## 2024-03-21 DIAGNOSIS — Z87891 Personal history of nicotine dependence: Secondary | ICD-10-CM | POA: Diagnosis not present

## 2024-03-21 DIAGNOSIS — Z7982 Long term (current) use of aspirin: Secondary | ICD-10-CM | POA: Diagnosis not present

## 2024-03-21 DIAGNOSIS — Z79899 Other long term (current) drug therapy: Secondary | ICD-10-CM | POA: Diagnosis not present

## 2024-03-21 DIAGNOSIS — Z7984 Long term (current) use of oral hypoglycemic drugs: Secondary | ICD-10-CM | POA: Insufficient documentation

## 2024-03-21 DIAGNOSIS — I428 Other cardiomyopathies: Secondary | ICD-10-CM | POA: Insufficient documentation

## 2024-03-21 DIAGNOSIS — I110 Hypertensive heart disease with heart failure: Secondary | ICD-10-CM | POA: Insufficient documentation

## 2024-03-21 DIAGNOSIS — G4733 Obstructive sleep apnea (adult) (pediatric): Secondary | ICD-10-CM | POA: Diagnosis not present

## 2024-03-21 DIAGNOSIS — Z95810 Presence of automatic (implantable) cardiac defibrillator: Secondary | ICD-10-CM | POA: Insufficient documentation

## 2024-03-21 DIAGNOSIS — I472 Ventricular tachycardia, unspecified: Secondary | ICD-10-CM | POA: Insufficient documentation

## 2024-03-21 DIAGNOSIS — E785 Hyperlipidemia, unspecified: Secondary | ICD-10-CM | POA: Insufficient documentation

## 2024-03-21 LAB — BASIC METABOLIC PANEL WITH GFR
Anion gap: 11 (ref 5–15)
BUN: 26 mg/dL — ABNORMAL HIGH (ref 8–23)
CO2: 26 mmol/L (ref 22–32)
Calcium: 9.3 mg/dL (ref 8.9–10.3)
Chloride: 104 mmol/L (ref 98–111)
Creatinine, Ser: 1.41 mg/dL — ABNORMAL HIGH (ref 0.61–1.24)
GFR, Estimated: 56 mL/min — ABNORMAL LOW
Glucose, Bld: 84 mg/dL (ref 70–99)
Potassium: 4.5 mmol/L (ref 3.5–5.1)
Sodium: 141 mmol/L (ref 135–145)

## 2024-03-21 LAB — ECHOCARDIOGRAM COMPLETE
Area-P 1/2: 3.5 cm2
Calc EF: 24.5 %
S' Lateral: 5.8 cm
Single Plane A2C EF: 21.9 %
Single Plane A4C EF: 25.6 %

## 2024-03-21 MED ORDER — MEXILETINE HCL 200 MG PO CAPS: 200.0000 mg | ORAL_CAPSULE | Freq: Two times a day (BID) | ORAL | 3 refills | Status: AC

## 2024-03-21 MED ORDER — CARVEDILOL 25 MG PO TABS: 25.0000 mg | ORAL_TABLET | Freq: Two times a day (BID) | ORAL | 5 refills | Status: AC

## 2024-03-21 NOTE — Patient Instructions (Signed)
 Medication Changes:  INCREASE CARVEDILOL  TO 25MG  TWICE DAILY   Lab Work:  Labs done today, your results will be available in MyChart, we will contact you for abnormal readings.  Follow-Up in: 4 MONTHS WITH APP CLINIC AS SCHEDULED   At the [facility], you and your health needs are our priority. We have a designated team specialized in the treatment of Heart Failure. This Care Team includes your primary Heart Failure Specialized Cardiologist (physician), Advanced Practice Providers (APPs- Physician Assistants and Nurse Practitioners), and Pharmacist who all work together to provide you with the care you need, when you need it.   You may see any of the following providers on your designated Care Team at your next follow up:  Dr. Toribio Fuel Dr. Ezra Shuck Dr. Odis Brownie Greig Mosses, NP Caffie Shed, [place] [facility] [place], [place] Beckey Coe, NP Jordan Lee, NP Tinnie Redman, PharmD   Please be sure to bring in all your medications bottles to every appointment.   Need to Contact Us :  If you have any questions or concerns before your next appointment please send us  a message through [place] or call our office at [phone].    TO LEAVE A MESSAGE FOR THE NURSE SELECT OPTION 2, PLEASE LEAVE A MESSAGE INCLUDING: YOUR NAME DATE OF BIRTH CALL BACK NUMBER REASON FOR CALL**this is important as we prioritize the call backs  YOU WILL RECEIVE A CALL BACK THE SAME DAY AS LONG AS YOU CALL BEFORE 4:00 PM

## 2024-03-23 ENCOUNTER — Ambulatory Visit (HOSPITAL_COMMUNITY): Payer: Self-pay | Admitting: Cardiology

## 2024-03-23 NOTE — Progress Notes (Signed)
 Patient ID: Brian Noble, male   DOB: 18-Sep-1960, 64 y.o.   MRN: [id number]   PCP: Sirivol, Mamatha, MD Cardiology: Dr Rolan EP. Dr Fernande   CC: HF follow up  64 y.o. with a prior history of cardiomyopathy who developed a cardiac arrest and was found to have low EF and normal coronaries.  Per patient, he was found to have low EF around 20% about 8 years ago.  He was seen by a cardiologist in [place] (he thinks), and EF went back to normal range.  He had no problems up until 8/16.  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  He was noted to pass out by family.  EMS was called and arrived quickly.  He was in either ventricular fibrillation or VT and was defibrillated.  He was cooled and sent for coronary angiography, which showed no significant coronary disease.  Echo showed EF 10% with severe RV dysfunction.  After recovery, he got a Medtronic ICD.  Repeat echo in 11/16 showed EF 20% with septal-lateral dyssynchrony, moderately decreased RV systolic function.  Unable to take Bidil due to severe headaches.    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 Appropriate shock due to VT. ECHO completed and showed EF 20-25% and normal RV. Carvedilol  increased to 18.75 mg twice a day. He was discharged on 12/20/17. He had follow up with Jeoffrey Arm EP NP and carvedilol  was increased to 25 mg twice a day.   RHC/LHC was done in 11/19, showing no significant CAD and optimized filling pressures with preserved CI 2.31. CPX in 11/19 showed deconditioning but no significant cardiac limitation.    Echo in 7/21 showed EF 25-30%, mildly decreased RV systolic function. He had VT with ATP in 2/22, now on mexiletine.  He had VT again in 3/23 with ICD shock.  Zio monitor (4/23) showed rare PVCs, 40 short NSVT runs.    In 8/23, it looks like he had a VT run again terminated by ATP based on device interrogation.   Echo in 9/23 showed EF 20%, global hypokinesis, mildly decreased RV systolic function.    Follow up 10/23, had an episode where he passed out transiently (in the setting of poor po intake and being on a treadmill). No events on device interrogation. Referred to EP to discuss CRT-D.  EP held off again with minimal symptoms.   CPX 7/24 showed moderate functional limitation due to deconditioning and HF. There was significant chronotropic incompetence and frequent PVCs.  Seen in ED 04/16/23 with a/c HF. Given 40 mg IV lasix . He was discharged home with instructions to start Lasix  20 mg daily x 10 days.  Patient had VT while trimming his hedge in 3/25, he had a syncopal episode and his ICD shocked him back to NSR.   Patient had upgrade to CRT-D device in 5/25.   Echo was done today and reviewed, EF 20-25%, moderate LV dilation, mild RV dysfunction.   Today he returns for HF follow up with his wife. No palpitations or ICD discharge.  Feeling better symptomatically since CRT upgrade.  No exertional dyspnea or chest pain. He was laid off this year and has not been exercising as much, weight is up.  No orthopnea/PND.  No chest pain. No lightheadedness.   ECG (personally reviewed): A-BiV paced  Labs (1/24): LDL 95 Labs (1/25): reviewed from ED 04/16/23 K 3.7, SCr 1 Labs (1/25): K 3.8, creatinine 1.13 Labs (3/25): K 5.5, creatinine 1.45, LDL 90 Labs (9/25): K  4.7, creatinine 1.34, LDL 90   PMH: 1. LBBB/IVCD 2. HTN: x years 3. Hyperlipidemia 4. Chronic systolic CHF: Medtronic CRT-D device.  Patient was told around 2008 that his EF was about 20%.  He says that it recovered back to normal.  He thinks he may have seen a cardiologist in [place] at that time. On 11/10/14, he was admitted after having ventricular fibrillation versus VT arrest and being shocked by AED.   - LHC (8/16): Normal coronaries, EF < 25%. - Echo (8/16): EF 10%, diffuse hypokinesis, mild MR, severely decreased RV systolic function.  - Echo (11/16): EF 20%, septal-lateral dyssynchrony, mildly dilated LV with mild LVH,  moderately decreased RV systolic function.  - CPX (3/17): Peak VO2 23.1 (69% predicted peak VO2) VE/VCO2 slope:  32 OUES: 2.00 Peak RER: 1.11, Ventilatory Threshold: 17.7 (53% predicted or measured peak VO2) => Mild functional impairment. - Echo (12/17): EF 30%, mild LV dilation, septal-lateral dyssynchrony, mild to moderately decreased RV systolic function.  - ECHO (10/19): EF 20-25%.  - LHC/RHC (11/19): No significant coronary disease; RA mean 3, PA 24/10, PCWP mean 7, Cardiac Index (Fick) 2.31.  - CPX (11/19): Submaximal with RER 0.8, VE/VCO2 slope 29, peak VO2 15.1 => no significant cardiac limitation, appears deconditioned.  - Echo (7/21): EF 25-30%, mildly decreased RV systolic function - Echo (9/23): EF 20%, global hypokinesis, mildly decreased RV systolic function.  - [employer] gene testing for cardiomyopathy was negative.  - CPX (7/24): RER 1.07, VE/VCO2 slope 30, peak VO2 15.6 =>moderate function limitation from deconditioning and HF - Echo (1/26): EF 20-25%, moderate LV dilation, mild RV dysfunction.  5. Event monitor (4/17): No atrial fibrillation.  Runs NSVT noted.  6. VT: With history of syncope.  7. OSA: Severe, does not use CPAP.    FH: Sister with heart transplant, father with cardiac arrest when about 30, 2 other sisters with heart problems.  He has 10 siblings in all.   SH: Married, works in a museum/gallery curator but also a optician, dispensing, no smoking or ETOH.  ROS: All systems reviewed and negative except as per HPI.   Current Outpatient Medications  Medication Sig Dispense Refill   albuterol (VENTOLIN HFA) 108 (90 Base) MCG/ACT inhaler Inhale 1 puff into the lungs every 4 (four) hours as needed for wheezing.     aspirin  EC 81 MG tablet Take 81 mg by mouth daily.     budesonide (PULMICORT) 0.5 MG/2ML nebulizer solution      chlorhexidine  (PERIDEX ) 0.12 % solution Use as directed 15 mLs in the mouth or throat 2 (two) times daily.     empagliflozin  (JARDIANCE ) 10 MG TABS tablet  Take 1 tablet (10 mg total) by mouth daily. 30 tablet 11   ENTRESTO  97-103 MG Take 1 tablet by mouth 2 (two) times daily. 60 tablet 11   eplerenone  (INSPRA ) 50 MG tablet TAKE 1 TABLET BY MOUTH EVERY DAY 90 tablet 3   furosemide  (LASIX ) 20 MG tablet Take 2 tablets (40 mg total) by mouth daily. 60 tablet 11   ibuprofen (ADVIL) 600 MG tablet Take 600 mg by mouth every 6 (six) hours as needed.     lovastatin  (MEVACOR ) 20 MG tablet TAKE 1 TABLET BY MOUTH EVERY DAY 90 tablet 0   carvedilol  (COREG ) 25 MG tablet Take 1 tablet (25 mg total) by mouth 2 (two) times daily. 60 tablet 5   mexiletine (MEXITIL) 200 MG capsule Take 1 capsule (200 mg total) by mouth 2 (two) times daily. 180 capsule  3   No current facility-administered medications for this encounter.   BP 118/80   Pulse 60   Ht 5' 8 (1.727 m)   Wt 92.9 kg (204 lb 12.8 oz)   SpO2 96%   BMI 31.14 kg/m   Wt Readings from Last 3 Encounters:  03/21/24 92.9 kg (204 lb 12.8 oz)  12/14/23 89.7 kg (197 lb 12.8 oz)  11/30/23 90.5 kg (199 lb 9.6 oz)  General: NAD Neck: No JVD, no thyromegaly or thyroid  nodule.  Lungs: Clear to auscultation bilaterally with normal respiratory effort. CV: Nondisplaced PMI.  Heart regular S1/S2, no S3/S4, no murmur.  No peripheral edema.  No carotid bruit.  Normal pedal pulses.  Abdomen: Soft, nontender, no hepatosplenomegaly, no distention.  Skin: Intact without lesions or rashes.  Neurologic: Alert and oriented x 3.  Psych: Normal affect. Extremities: No clubbing or cyanosis.  HEENT: Normal.   Assessment/Plan: 1. Chronic systolic CHF: Nonischemic cardiomyopathy.  Medtronic CRT-D.  Possibly familial CMP given father with SCD at 66, sister with heart transplant, and 2 other sisters with heart problems.  However, [employer] gene testing was negative for common familial cardiomyopathies.  Cannot rule out prior viral myocarditis or role for HTN.  Zio monitor in 4/23 showed only rare PVCs.  Echo 12/17 with EF 30%,  septal-lateral dyssynchrony.  CPX in 3/17 with mild functional impairment. Echo in 10/19 with EF 20-25%.  CPX repeat 01/2018 with submaximal effort but minimal cardiac impairment, suggestive of deconditioning.  RHC in 11/19 showed optimized filling pressures with preserved cardiac index.  Echo in 7/21 with stable EF 25-30%.  Echo in 9/23 showed EF 20%. CPX (7/24) showed moderate functional limitation due to deconditioning and HF limitation. Echo in 1/26 unchanged with EF 20-25%, moderate LV dilation, mild RV dysfunction.  NYHA class I, not volume overloaded on exam.  I think his weight gain is caloric/dietary.  - Continue Lasix  40 mg daily. BMET and BNP today. - Increase Coreg  to 25 mg daily.  - Continue Entresto  97/103 bid.   - Continue Jardiance  10 mg daily.  - Continue eplerenone  50 mg daily.  - Unable to tolerate Bidil due to headaches.  2. VT: Has Medtronic ICD.  VT with syncope in 10/19.  VT with ATP in 2/22, started on mexiletine. VT with ICD shock in 3/23.  VT with ATP in 8/23. VT with shock and syncope in 3/25.  - Increase Coreg  as above.  - Continue mexiletine.  - Check BMET, Mg. 3. OSA: Has refused to use CPAP.      Followup in 4 months with APP.   I spent 32 minutes reviewing records, interviewing/examining patient, and managing orders.   Ezra Shuck 03/23/2024

## 2024-04-03 ENCOUNTER — Other Ambulatory Visit (HOSPITAL_COMMUNITY): Payer: Self-pay | Admitting: Family Medicine

## 2024-04-11 ENCOUNTER — Ambulatory Visit: Payer: Self-pay

## 2024-04-11 DIAGNOSIS — I5022 Chronic systolic (congestive) heart failure: Secondary | ICD-10-CM | POA: Diagnosis not present

## 2024-04-11 LAB — CUP PACEART REMOTE DEVICE CHECK
Battery Remaining Longevity: 105 mo
Battery Voltage: 3 V
Brady Statistic RV Percent Paced: 95.23 %
Date Time Interrogation Session: 20260123043910
HighPow Impedance: 72 Ohm
Implantable Lead Connection Status: 753985
Implantable Lead Connection Status: 753985
Implantable Lead Connection Status: 753985
Implantable Lead Implant Date: 20160829
Implantable Lead Implant Date: 20250514
Implantable Lead Implant Date: 20250514
Implantable Lead Location: 753858
Implantable Lead Location: 753859
Implantable Lead Location: 753860
Implantable Lead Model: 4398
Implantable Lead Model: 5076
Implantable Pulse Generator Implant Date: 20250514
Lead Channel Impedance Value: 285 Ohm
Lead Channel Impedance Value: 304 Ohm
Lead Channel Impedance Value: 342 Ohm
Lead Channel Impedance Value: 361 Ohm
Lead Channel Impedance Value: 399 Ohm
Lead Channel Impedance Value: 418 Ohm
Lead Channel Impedance Value: 475 Ohm
Lead Channel Impedance Value: 475 Ohm
Lead Channel Impedance Value: 532 Ohm
Lead Channel Impedance Value: 551 Ohm
Lead Channel Impedance Value: 570 Ohm
Lead Channel Impedance Value: 589 Ohm
Lead Channel Impedance Value: 608 Ohm
Lead Channel Pacing Threshold Amplitude: 0.5 V
Lead Channel Pacing Threshold Amplitude: 0.625 V
Lead Channel Pacing Threshold Amplitude: 0.625 V
Lead Channel Pacing Threshold Pulse Width: 0.4 ms
Lead Channel Pacing Threshold Pulse Width: 0.4 ms
Lead Channel Pacing Threshold Pulse Width: 0.4 ms
Lead Channel Sensing Intrinsic Amplitude: 1.4 mV
Lead Channel Sensing Intrinsic Amplitude: 5.3 mV
Lead Channel Setting Pacing Amplitude: 1.5 V
Lead Channel Setting Pacing Amplitude: 1.75 V
Lead Channel Setting Pacing Amplitude: 2 V
Lead Channel Setting Pacing Pulse Width: 0.4 ms
Lead Channel Setting Pacing Pulse Width: 0.4 ms
Lead Channel Setting Sensing Sensitivity: 0.3 mV
Zone Setting Status: 755011
Zone Setting Status: 755011
Zone Setting Status: 755011

## 2024-04-14 NOTE — Progress Notes (Signed)
 Remote ICD Transmission

## 2024-04-15 ENCOUNTER — Other Ambulatory Visit: Payer: Self-pay

## 2024-04-15 ENCOUNTER — Encounter: Payer: Self-pay | Admitting: Cardiology

## 2024-04-15 ENCOUNTER — Ambulatory Visit: Payer: Self-pay | Admitting: Cardiovascular Disease

## 2024-04-15 MED ORDER — SACUBITRIL-VALSARTAN 97-103 MG PO TABS: 1.0000 | ORAL_TABLET | Freq: Two times a day (BID) | ORAL | 11 refills | Status: AC

## 2024-05-01 ENCOUNTER — Encounter

## 2024-06-13 ENCOUNTER — Ambulatory Visit

## 2024-07-18 ENCOUNTER — Ambulatory Visit (HOSPITAL_COMMUNITY)

## 2024-07-31 ENCOUNTER — Encounter

## 2024-10-30 ENCOUNTER — Encounter

## 2025-01-29 ENCOUNTER — Encounter

## 2025-04-30 ENCOUNTER — Encounter

## 2025-07-30 ENCOUNTER — Encounter
